# Patient Record
Sex: Male | Born: 1945 | Race: Black or African American | Hispanic: No | Smoking: Former smoker
Health system: Southern US, Community
[De-identification: ages and names within clinical notes are randomized; demographics above are authoritative.]

## PROBLEM LIST (undated history)

## (undated) DIAGNOSIS — I5030 Unspecified diastolic (congestive) heart failure: Secondary | ICD-10-CM

## (undated) DIAGNOSIS — E785 Hyperlipidemia, unspecified: Secondary | ICD-10-CM

## (undated) DIAGNOSIS — I2510 Atherosclerotic heart disease of native coronary artery without angina pectoris: Secondary | ICD-10-CM

## (undated) DIAGNOSIS — E119 Type 2 diabetes mellitus without complications: Secondary | ICD-10-CM

## (undated) DIAGNOSIS — D751 Secondary polycythemia: Secondary | ICD-10-CM

## (undated) DIAGNOSIS — K859 Acute pancreatitis without necrosis or infection, unspecified: Secondary | ICD-10-CM

## (undated) DIAGNOSIS — Z720 Tobacco use: Secondary | ICD-10-CM

## (undated) DIAGNOSIS — I700 Atherosclerosis of aorta: Secondary | ICD-10-CM

## (undated) DIAGNOSIS — J449 Chronic obstructive pulmonary disease, unspecified: Secondary | ICD-10-CM

## (undated) DIAGNOSIS — I4892 Unspecified atrial flutter: Secondary | ICD-10-CM

## (undated) DIAGNOSIS — N183 Chronic kidney disease, stage 3 unspecified: Secondary | ICD-10-CM

## (undated) DIAGNOSIS — I10 Essential (primary) hypertension: Secondary | ICD-10-CM

## (undated) DIAGNOSIS — C801 Malignant (primary) neoplasm, unspecified: Secondary | ICD-10-CM

## (undated) DIAGNOSIS — D3A00 Benign carcinoid tumor of unspecified site: Secondary | ICD-10-CM

## (undated) HISTORY — DX: Atherosclerotic heart disease of native coronary artery without angina pectoris: I25.10

## (undated) HISTORY — DX: Acute pancreatitis without necrosis or infection, unspecified: K85.90

## (undated) HISTORY — DX: Essential (primary) hypertension: I10

## (undated) HISTORY — DX: Type 2 diabetes mellitus without complications: E11.9

## (undated) HISTORY — DX: Chronic obstructive pulmonary disease, unspecified: J44.9

## (undated) HISTORY — DX: Unspecified diastolic (congestive) heart failure: I50.30

## (undated) HISTORY — DX: Secondary polycythemia: D75.1

## (undated) HISTORY — DX: Benign carcinoid tumor of unspecified site: D3A.00

## (undated) HISTORY — DX: Tobacco use: Z72.0

## (undated) HISTORY — PX: COLONOSCOPY: SHX174

## (undated) HISTORY — DX: Atherosclerosis of aorta: I70.0

## (undated) HISTORY — DX: Hyperlipidemia, unspecified: E78.5

## (undated) HISTORY — DX: Unspecified atrial flutter: I48.92

---

## 2004-09-21 ENCOUNTER — Ambulatory Visit: Payer: Self-pay | Admitting: General Surgery

## 2006-03-31 ENCOUNTER — Ambulatory Visit: Payer: Self-pay | Admitting: Internal Medicine

## 2007-09-26 ENCOUNTER — Ambulatory Visit: Payer: Self-pay | Admitting: Internal Medicine

## 2008-09-12 ENCOUNTER — Ambulatory Visit: Payer: Self-pay | Admitting: Internal Medicine

## 2009-04-01 ENCOUNTER — Ambulatory Visit: Payer: Self-pay | Admitting: Cardiology

## 2009-04-01 ENCOUNTER — Ambulatory Visit: Payer: Self-pay | Admitting: Ophthalmology

## 2009-04-15 ENCOUNTER — Ambulatory Visit: Payer: Self-pay | Admitting: Ophthalmology

## 2009-04-15 ENCOUNTER — Ambulatory Visit: Payer: Self-pay | Admitting: Internal Medicine

## 2009-05-09 ENCOUNTER — Ambulatory Visit: Payer: Self-pay | Admitting: Internal Medicine

## 2009-05-15 ENCOUNTER — Ambulatory Visit: Payer: Self-pay | Admitting: Internal Medicine

## 2009-05-27 ENCOUNTER — Ambulatory Visit: Payer: Self-pay | Admitting: Ophthalmology

## 2009-06-15 ENCOUNTER — Ambulatory Visit: Payer: Self-pay | Admitting: Internal Medicine

## 2009-07-16 ENCOUNTER — Ambulatory Visit: Payer: Self-pay | Admitting: Internal Medicine

## 2009-08-15 ENCOUNTER — Ambulatory Visit: Payer: Self-pay | Admitting: Internal Medicine

## 2009-12-01 ENCOUNTER — Ambulatory Visit: Payer: Self-pay | Admitting: Internal Medicine

## 2009-12-27 ENCOUNTER — Emergency Department: Payer: Self-pay | Admitting: Emergency Medicine

## 2010-01-26 ENCOUNTER — Ambulatory Visit: Payer: Self-pay | Admitting: Gastroenterology

## 2010-03-05 ENCOUNTER — Ambulatory Visit: Payer: Self-pay | Admitting: Gastroenterology

## 2011-01-22 ENCOUNTER — Ambulatory Visit: Payer: Self-pay | Admitting: Internal Medicine

## 2011-02-26 ENCOUNTER — Ambulatory Visit: Payer: Self-pay | Admitting: Internal Medicine

## 2014-06-06 DIAGNOSIS — E119 Type 2 diabetes mellitus without complications: Secondary | ICD-10-CM | POA: Insufficient documentation

## 2014-06-11 DIAGNOSIS — E279 Disorder of adrenal gland, unspecified: Secondary | ICD-10-CM

## 2014-06-11 DIAGNOSIS — E538 Deficiency of other specified B group vitamins: Secondary | ICD-10-CM | POA: Insufficient documentation

## 2014-06-11 DIAGNOSIS — R809 Proteinuria, unspecified: Secondary | ICD-10-CM | POA: Insufficient documentation

## 2014-06-11 DIAGNOSIS — E278 Other specified disorders of adrenal gland: Secondary | ICD-10-CM | POA: Insufficient documentation

## 2014-06-11 DIAGNOSIS — E781 Pure hyperglyceridemia: Secondary | ICD-10-CM | POA: Insufficient documentation

## 2014-10-22 ENCOUNTER — Ambulatory Visit: Payer: Self-pay | Admitting: Hematology and Oncology

## 2014-10-29 ENCOUNTER — Other Ambulatory Visit: Payer: Self-pay | Admitting: Physician Assistant

## 2014-10-29 ENCOUNTER — Ambulatory Visit: Payer: Self-pay | Admitting: Hematology and Oncology

## 2014-10-29 ENCOUNTER — Inpatient Hospital Stay: Payer: Self-pay | Admitting: Internal Medicine

## 2014-10-29 DIAGNOSIS — I10 Essential (primary) hypertension: Secondary | ICD-10-CM

## 2014-10-29 DIAGNOSIS — I4892 Unspecified atrial flutter: Secondary | ICD-10-CM

## 2014-10-29 DIAGNOSIS — J189 Pneumonia, unspecified organism: Secondary | ICD-10-CM

## 2014-10-29 DIAGNOSIS — E785 Hyperlipidemia, unspecified: Secondary | ICD-10-CM

## 2014-10-29 LAB — PRO B NATRIURETIC PEPTIDE: B-TYPE NATIURETIC PEPTID: 923 pg/mL — AB (ref 0–125)

## 2014-10-29 LAB — URINALYSIS, COMPLETE
Bacteria: NONE SEEN
Bilirubin,UR: NEGATIVE
Blood: NEGATIVE
GLUCOSE, UR: NEGATIVE mg/dL (ref 0–75)
Hyaline Cast: 9
Ketone: NEGATIVE
LEUKOCYTE ESTERASE: NEGATIVE
Nitrite: NEGATIVE
PH: 5 (ref 4.5–8.0)
SPECIFIC GRAVITY: 1.017 (ref 1.003–1.030)
Squamous Epithelial: <1

## 2014-10-29 LAB — PROTIME-INR
INR: 1
Prothrombin Time: 12.9 secs (ref 11.5–14.7)

## 2014-10-29 LAB — COMPREHENSIVE METABOLIC PANEL
ALBUMIN: 3 g/dL — AB (ref 3.4–5.0)
ANION GAP: 6 — AB (ref 7–16)
AST: 12 U/L — AB (ref 15–37)
Alkaline Phosphatase: 49 U/L
BILIRUBIN TOTAL: 0.8 mg/dL (ref 0.2–1.0)
BUN: 10 mg/dL (ref 7–18)
Calcium, Total: 9.4 mg/dL (ref 8.5–10.1)
Chloride: 101 mmol/L (ref 98–107)
Co2: 27 mmol/L (ref 21–32)
Creatinine: 1.11 mg/dL (ref 0.60–1.30)
EGFR (African American): >60
GLUCOSE: 131 mg/dL — AB (ref 65–99)
Osmolality: 279 (ref 275–301)
POTASSIUM: 4.7 mmol/L (ref 3.5–5.1)
SGPT (ALT): 24 U/L
SODIUM: 134 mmol/L — AB (ref 136–145)
TOTAL PROTEIN: 6.3 g/dL — AB (ref 6.4–8.2)

## 2014-10-29 LAB — CBC CANCER CENTER
BASOS ABS: 0 x10 3/mm (ref 0.0–0.1)
BASOS PCT: 0.4 %
EOS ABS: 0.2 x10 3/mm (ref 0.0–0.7)
EOS PCT: 1.6 %
HCT: 52.8 % — ABNORMAL HIGH (ref 40.0–52.0)
HGB: 17.5 g/dL (ref 13.0–18.0)
Lymphocyte #: 1.6 x10 3/mm (ref 1.0–3.6)
Lymphocyte %: 15.3 %
MCH: 32.7 pg (ref 26.0–34.0)
MCHC: 33.2 g/dL (ref 32.0–36.0)
MCV: 99 fL (ref 80–100)
MONOS PCT: 11.8 %
Monocyte #: 1.2 x10 3/mm — ABNORMAL HIGH (ref 0.2–1.0)
NEUTROS ABS: 7.2 x10 3/mm — AB (ref 1.4–6.5)
NEUTROS PCT: 70.9 %
Platelet: 231 x10 3/mm (ref 150–440)
RBC: 5.36 10*6/uL (ref 4.40–5.90)
RDW: 12.6 % (ref 11.5–14.5)
WBC: 10.2 x10 3/mm (ref 3.8–10.6)

## 2014-10-29 LAB — CK TOTAL AND CKMB (NOT AT ARMC)
CK, TOTAL: 64 U/L (ref 39–308)
CK-MB: 1.7 ng/mL (ref 0.5–3.6)

## 2014-10-29 LAB — CBC
HCT: 52 % (ref 40.0–52.0)
HGB: 17.1 g/dL (ref 13.0–18.0)
MCH: 32.8 pg (ref 26.0–34.0)
MCHC: 32.9 g/dL (ref 32.0–36.0)
MCV: 100 fL (ref 80–100)
Platelet: 237 10*3/uL (ref 150–440)
RBC: 5.22 10*6/uL (ref 4.40–5.90)
RDW: 12.6 % (ref 11.5–14.5)
WBC: 12.3 10*3/uL — ABNORMAL HIGH (ref 3.8–10.6)

## 2014-10-29 LAB — SEDIMENTATION RATE: Erythrocyte Sed Rate: 3 mm/hr (ref 0–20)

## 2014-10-29 LAB — IRON AND TIBC
IRON SATURATION: 30 %
IRON: 99 ug/dL (ref 65–175)
Iron Bind.Cap.(Total): 333 ug/dL (ref 250–450)
UNBOUND IRON-BIND. CAP.: 234 ug/dL

## 2014-10-29 LAB — TROPONIN I

## 2014-10-29 LAB — APTT: Activated PTT: 29.1 secs (ref 23.6–35.9)

## 2014-10-29 LAB — D-DIMER(ARMC): D-Dimer: 961 ng/ml

## 2014-10-29 LAB — FERRITIN: FERRITIN (ARMC): 282 ng/mL (ref 8–388)

## 2014-10-30 DIAGNOSIS — I517 Cardiomegaly: Secondary | ICD-10-CM

## 2014-10-30 LAB — BASIC METABOLIC PANEL
Anion Gap: 8 (ref 7–16)
BUN: 11 mg/dL (ref 7–18)
CALCIUM: 8.9 mg/dL (ref 8.5–10.1)
CHLORIDE: 103 mmol/L (ref 98–107)
CO2: 27 mmol/L (ref 21–32)
CREATININE: 0.99 mg/dL (ref 0.60–1.30)
EGFR (Non-African Amer.): >60
Glucose: 109 mg/dL — ABNORMAL HIGH (ref 65–99)
Osmolality: 276 (ref 275–301)
POTASSIUM: 4 mmol/L (ref 3.5–5.1)
SODIUM: 138 mmol/L (ref 136–145)

## 2014-10-30 LAB — CBC WITH DIFFERENTIAL/PLATELET
Bands: 3 %
COMMENT - H1-COM3: NORMAL
Eosinophil: 1 %
HCT: 48.4 % (ref 40.0–52.0)
HGB: 15.9 g/dL (ref 13.0–18.0)
Lymphocytes: 11 %
MCH: 32.6 pg (ref 26.0–34.0)
MCHC: 32.8 g/dL (ref 32.0–36.0)
MCV: 99 fL (ref 80–100)
METAMYELOCYTE: 2 %
Monocytes: 9 %
Myelocyte: 5 %
Platelet: 215 10*3/uL (ref 150–440)
RBC: 4.87 10*6/uL (ref 4.40–5.90)
RDW: 12.8 % (ref 11.5–14.5)
Segmented Neutrophils: 69 %
WBC: 12 10*3/uL — ABNORMAL HIGH (ref 3.8–10.6)

## 2014-10-30 LAB — MAGNESIUM
MAGNESIUM: 1.8 mg/dL
Magnesium: 1.3 mg/dL — ABNORMAL LOW

## 2014-10-30 LAB — HEMOGLOBIN A1C: Hemoglobin A1C: 7 % — ABNORMAL HIGH (ref 4.2–6.3)

## 2014-10-31 ENCOUNTER — Encounter: Payer: Self-pay | Admitting: Cardiovascular Disease

## 2014-10-31 DIAGNOSIS — I38 Endocarditis, valve unspecified: Secondary | ICD-10-CM

## 2014-10-31 LAB — CBC WITH DIFFERENTIAL/PLATELET
Bands: 1 %
Comment - H1-Com1: NORMAL
HCT: 49.3 % (ref 40.0–52.0)
HGB: 16.5 g/dL (ref 13.0–18.0)
Lymphocytes: 11 %
MCH: 33.4 pg (ref 26.0–34.0)
MCHC: 33.5 g/dL (ref 32.0–36.0)
MCV: 100 fL (ref 80–100)
Metamyelocyte: 3 %
Monocytes: 8 %
Myelocyte: 2 %
Platelet: 213 10*3/uL (ref 150–440)
RBC: 4.95 10*6/uL (ref 4.40–5.90)
RDW: 12.6 % (ref 11.5–14.5)
Segmented Neutrophils: 73 %
Variant Lymphocyte - H1-Rlymph: 2 %
WBC: 12 10*3/uL — ABNORMAL HIGH (ref 3.8–10.6)

## 2014-10-31 LAB — BASIC METABOLIC PANEL
ANION GAP: 6 — AB (ref 7–16)
BUN: 12 mg/dL (ref 7–18)
Calcium, Total: 9.4 mg/dL (ref 8.5–10.1)
Chloride: 101 mmol/L (ref 98–107)
Co2: 28 mmol/L (ref 21–32)
Creatinine: 1.05 mg/dL (ref 0.60–1.30)
EGFR (Non-African Amer.): >60
Glucose: 132 mg/dL — ABNORMAL HIGH (ref 65–99)
Osmolality: 272 (ref 275–301)
Potassium: 3.9 mmol/L (ref 3.5–5.1)
Sodium: 135 mmol/L — ABNORMAL LOW (ref 136–145)

## 2014-11-01 LAB — BASIC METABOLIC PANEL
Anion Gap: 8 (ref 7–16)
BUN: 11 mg/dL (ref 7–18)
CALCIUM: 9.4 mg/dL (ref 8.5–10.1)
CREATININE: 0.99 mg/dL (ref 0.60–1.30)
Chloride: 103 mmol/L (ref 98–107)
Co2: 27 mmol/L (ref 21–32)
EGFR (African American): >60
EGFR (Non-African Amer.): >60
Glucose: 125 mg/dL — ABNORMAL HIGH (ref 65–99)
Osmolality: 277 (ref 275–301)
Potassium: 3.8 mmol/L (ref 3.5–5.1)
SODIUM: 138 mmol/L (ref 136–145)

## 2014-11-01 LAB — PROT IMMUNOELECTROPHORES(ARMC)

## 2014-11-03 LAB — CULTURE, BLOOD (SINGLE)

## 2014-11-04 LAB — CBC WITH DIFFERENTIAL/PLATELET
Bands: 3 %
COMMENT - H1-COM1: NORMAL
Comment - H1-Com2: NORMAL
Eosinophil: 9 %
HCT: 53.6 % — ABNORMAL HIGH (ref 40.0–52.0)
HGB: 17.8 g/dL (ref 13.0–18.0)
LYMPHS PCT: 9 %
MCH: 33.2 pg (ref 26.0–34.0)
MCHC: 33.2 g/dL (ref 32.0–36.0)
MCV: 100 fL (ref 80–100)
METAMYELOCYTE: 5 %
MYELOCYTE: 3 %
Monocytes: 8 %
Platelet: 245 10*3/uL (ref 150–440)
Promyelocyte: 1 %
RBC: 5.38 10*6/uL (ref 4.40–5.90)
RDW: 12.7 % (ref 11.5–14.5)
SEGMENTED NEUTROPHILS: 62 %
WBC: 13.8 10*3/uL — ABNORMAL HIGH (ref 3.8–10.6)

## 2014-11-05 ENCOUNTER — Telehealth: Payer: Self-pay

## 2014-11-05 LAB — CREATININE, SERUM
Creatinine: 1.15 mg/dL (ref 0.60–1.30)
EGFR (African American): >60
EGFR (Non-African Amer.): >60

## 2014-11-05 NOTE — Telephone Encounter (Signed)
Patient contacted regarding discharge from [facility] on 11/05/14.  Patient understands to follow up with Dr. Rockey Situ on 11/27/14 at 8:00 at [facility]. Patient understands discharge instructions? yes Patient understands medications and regiment? yes Patient understands to bring all medications to this visit? yes

## 2014-11-06 ENCOUNTER — Encounter: Payer: Self-pay | Admitting: Cardiovascular Disease

## 2014-11-15 ENCOUNTER — Ambulatory Visit: Payer: Self-pay | Admitting: Hematology and Oncology

## 2014-11-27 ENCOUNTER — Encounter: Payer: Self-pay | Admitting: Cardiovascular Disease

## 2014-11-27 ENCOUNTER — Ambulatory Visit (INDEPENDENT_AMBULATORY_CARE_PROVIDER_SITE_OTHER): Payer: PPO | Admitting: Cardiovascular Disease

## 2014-11-27 ENCOUNTER — Encounter (INDEPENDENT_AMBULATORY_CARE_PROVIDER_SITE_OTHER): Payer: Self-pay

## 2014-11-27 VITALS — BP 140/74 | HR 90 | Ht 66.0 in | Wt 216.5 lb

## 2014-11-27 DIAGNOSIS — Z87891 Personal history of nicotine dependence: Secondary | ICD-10-CM

## 2014-11-27 DIAGNOSIS — I483 Typical atrial flutter: Secondary | ICD-10-CM

## 2014-11-27 DIAGNOSIS — I4891 Unspecified atrial fibrillation: Secondary | ICD-10-CM

## 2014-11-27 DIAGNOSIS — J439 Emphysema, unspecified: Secondary | ICD-10-CM

## 2014-11-27 DIAGNOSIS — Z720 Tobacco use: Secondary | ICD-10-CM

## 2014-11-27 DIAGNOSIS — I10 Essential (primary) hypertension: Secondary | ICD-10-CM | POA: Insufficient documentation

## 2014-11-27 DIAGNOSIS — J189 Pneumonia, unspecified organism: Secondary | ICD-10-CM

## 2014-11-27 MED ORDER — METOPROLOL SUCCINATE ER 50 MG PO TB24: 50.0000 mg | ORAL_TABLET | Freq: Every day | ORAL | Status: DC

## 2014-11-27 MED ORDER — FUROSEMIDE 20 MG PO TABS: 20.0000 mg | ORAL_TABLET | Freq: Every day | ORAL | Status: DC | PRN

## 2014-11-27 MED ORDER — METFORMIN HCL 1000 MG PO TABS: 1000.0000 mg | ORAL_TABLET | Freq: Two times a day (BID) | ORAL | Status: DC

## 2014-11-27 NOTE — Assessment & Plan Note (Signed)
Long history of smoking. Mildly short of breath on today's visit though he denies any symptoms. Recent COPD exacerbation, still recovering

## 2014-11-27 NOTE — Patient Instructions (Signed)
  You are still in atrial flutter  Please start lasix every other day, to help the breathing Please start metoprolol 50 mg daily, to slow the speed  Please call us if you have new issues that need to be addressed before your next appt.  Your physician wants you to follow-up in: 2 to 3 weeks

## 2014-11-27 NOTE — Assessment & Plan Note (Signed)
Normal sinus rhythm was restored in the hospital after initial presentation of atrial flutter. Arrhythmia likely exacerbated by underlying COPD with possible pneumonia. He was discharged with continued cough, sputum, though improving. Now presenting with recurrent atrial flutter. He does have underlying COPD, unable to exclude some mild diastolic CHF. We will start metoprolol 50 mg daily, Lasix 20 mg every other day. Continue amiodarone, anticoagulation. I hope would be that he will convert back to normal sinus rhythm. If he does not, he may need repeat cardioversion at a later date, and several weeks. He is relatively asymptomatic at this time

## 2014-11-27 NOTE — Progress Notes (Signed)
[name], male    DOB: Apr 01, 1946, 69 y.o.   MRN: [id number]  HPI Comments: [name] is a 69 year old gentleman, patient of Dr. Rebecka Apley, with history of hypertension, diabetes, COPD, polycythemia who was sent from oncology to the hospital for tachycardia. Cardiology evaluated the patient 10/29/2014. EKG showed atrial flutter with heart rate 140s up to 150. He has a long smoking history for 45 years. He also reported having 2-3 weeks of cough with yellow phlegm.   In the hospital he was started on beta blockers, diltiazem, digoxin, eventually started on amiodarone infusion with anticoagulation, eliquis. Treatment was started for suspected pneumonia Initially started on levofloxacin but had an allergic reaction with diffuse rash. Change to azithromycin and Rocephin. He had TEE and cardioversion on 10/30/2014. Normal sinus rhythm was successfully restored and he was discharged in normal sinus rhythm  TEE report showed ejection fraction 45-50%, right and left atrium mildly enlarged, RV mildly dilated with mildly reduced systolic function, small amount of plaque in the aorta CT scan of the chest 10/29/2014 in the hospital showed bilateral predominantly lower lobe pulmonary opacity most consistent with pneumonia  In follow-up today, he feels well, has no significant shortness of breath, no chest pain. No significant lower extremity edema. He is unsure whether he is in normal rhythm or atrial flutter. Previous reaction to the levofloxacin/rash resolved at home.  EKG on today's visit shows atrial flutter with ventricular rate 90 bpm  Allergies not on file  Outpatient Encounter Prescriptions as of 11/27/2014  Medication Sig  . amiodarone (PACERONE) 200 MG tablet Take 200 mg by mouth 2 (two) times daily.  Marland Kitchen apixaban (ELIQUIS) 5 MG TABS tablet Take 5 mg by mouth 2 (two) times daily.  . digoxin (LANOXIN) 0.25 MG tablet Take 0.25 mg by mouth daily.  Marland Kitchen diltiazem (CARDIZEM CD)  180 MG 24 hr capsule Take 180 mg by mouth 2 (two) times daily.  . diphenhydrAMINE (SOMINEX) 25 MG tablet Take 25 mg by mouth 2 (two) times daily.  . famotidine (PEPCID) 20 MG tablet Take 20 mg by mouth 2 (two) times daily.  . furosemide (LASIX) 20 MG tablet Take 1 tablet (20 mg total) by mouth daily as needed.  . metFORMIN (GLUCOPHAGE) 1000 MG tablet Take 1 tablet (1,000 mg total) by mouth 2 (two) times daily with a meal.  . metoprolol succinate (TOPROL-XL) 50 MG 24 hr tablet Take 1 tablet (50 mg total) by mouth daily. Take with or immediately following a meal.  . rosuvastatin (CRESTOR) 10 MG tablet Take 10 mg by mouth daily.  . tamsulosin (FLOMAX) 0.4 MG CAPS capsule Take 0.4 mg by mouth.  . [DISCONTINUED] metFORMIN (GLUCOPHAGE) 1000 MG tablet Take 1,000 mg by mouth 2 (two) times daily with a meal.    Past Medical History  Diagnosis Date  . Hyperlipidemia   . Hypertension   . Diabetes mellitus without complication   . Tachycardia   . Polycythemia   . A-fib   . Atrial flutter   . COPD (chronic obstructive pulmonary disease)     Past Surgical History  Procedure Laterality Date  . Tee with cardioversion  12/16    Social History  reports that he quit smoking about 2 years ago. His smoking use included Cigarettes. He has a 67.5 pack-year smoking history. He does not have any smokeless tobacco history on file. He reports that he drinks about 7.2 oz of alcohol per week. He reports that he does not use illicit drugs.  Family History Family history is unknown by patient.   Review of Systems  Constitutional: Negative.   HENT: Negative.   Respiratory: Negative.   Cardiovascular: Negative.   Gastrointestinal: Negative.   Musculoskeletal: Negative.   Skin: Negative.   Neurological: Negative.   Hematological: Negative.   Psychiatric/Behavioral: Negative.   All other systems reviewed and are negative.   BP 140/74 mmHg  Pulse 90  Ht 5\' 6"  (1.676 m)  Wt 216 lb 8 oz (98.204 kg)   BMI 34.96 kg/m2  Physical Exam  Constitutional: He is oriented to person, place, and time. He appears well-developed and well-nourished.  HENT:  Head: Normocephalic.  Nose: Nose normal.  Mouth/Throat: Oropharynx is clear and moist.  Eyes: Conjunctivae are normal. Pupils are equal, round, and reactive to light.  Neck: Normal range of motion. Neck supple. No JVD present.  Cardiovascular: Normal rate, regular rhythm, S1 normal, S2 normal, normal heart sounds and intact distal pulses.  Exam reveals no gallop and no friction rub.   No murmur heard. Pulmonary/Chest: Effort normal. No respiratory distress. He has wheezes. He has no rales. He exhibits no tenderness.  Abdominal: Soft. Bowel sounds are normal. He exhibits no distension. There is no tenderness.  Musculoskeletal: Normal range of motion. He exhibits no edema or tenderness.  Lymphadenopathy:    He has no cervical adenopathy.  Neurological: He is alert and oriented to person, place, and time. Coordination normal.  Skin: Skin is warm and dry. No rash noted. No erythema.  Psychiatric: He has a normal mood and affect. His behavior is normal. Judgment and thought content normal.      Assessment and Plan   Nursing note and vitals reviewed.

## 2014-11-27 NOTE — Assessment & Plan Note (Signed)
Long history of smoking, severe underlying COPD. A benefit from inhalers, follow-up with pulmonary if symptoms get worse.

## 2014-11-27 NOTE — Assessment & Plan Note (Signed)
Recent allergy to Levaquin. In the hospital was changed to Z-Pak and ceftriaxone. He reports symptoms have improved

## 2014-12-09 ENCOUNTER — Telehealth: Payer: Self-pay

## 2014-12-09 ENCOUNTER — Other Ambulatory Visit: Payer: Self-pay | Admitting: *Deleted

## 2014-12-09 MED ORDER — AMIODARONE HCL 200 MG PO TABS: 200.0000 mg | ORAL_TABLET | Freq: Two times a day (BID) | ORAL | Status: DC

## 2014-12-09 MED ORDER — DILTIAZEM HCL ER COATED BEADS 180 MG PO CP24: 180.0000 mg | ORAL_CAPSULE | Freq: Two times a day (BID) | ORAL | Status: DC

## 2014-12-09 MED ORDER — DIGOXIN 250 MCG PO TABS: 0.2500 mg | ORAL_TABLET | Freq: Every day | ORAL | Status: DC

## 2014-12-09 NOTE — Telephone Encounter (Signed)
Pt called, has some questions regarding her Cartia, Amiodarone, and Digoxin not sure if she should continue to keep taking these. Please call and advise

## 2014-12-09 NOTE — Telephone Encounter (Signed)
Spoke w/ pt.  He reports that he was given rx for these meds and would like to know if he should continue.  Advised him that Dr. Rockey Situ did not make any changes to his meds at last ov.  He requests a 30 day refill on these meds, as he is out and has appt w/ Dr. Rockey Situ 12/27/14.

## 2014-12-27 ENCOUNTER — Other Ambulatory Visit: Payer: Self-pay

## 2014-12-27 ENCOUNTER — Ambulatory Visit (INDEPENDENT_AMBULATORY_CARE_PROVIDER_SITE_OTHER): Payer: PPO | Admitting: Cardiovascular Disease

## 2014-12-27 ENCOUNTER — Encounter: Payer: Self-pay | Admitting: Cardiovascular Disease

## 2014-12-27 ENCOUNTER — Ambulatory Visit: Payer: Self-pay | Admitting: Cardiovascular Disease

## 2014-12-27 VITALS — BP 132/60 | HR 60 | Ht 67.0 in | Wt 212.5 lb

## 2014-12-27 DIAGNOSIS — I4892 Unspecified atrial flutter: Secondary | ICD-10-CM

## 2014-12-27 DIAGNOSIS — I10 Essential (primary) hypertension: Secondary | ICD-10-CM

## 2014-12-27 DIAGNOSIS — Z720 Tobacco use: Secondary | ICD-10-CM

## 2014-12-27 DIAGNOSIS — I483 Typical atrial flutter: Secondary | ICD-10-CM

## 2014-12-27 DIAGNOSIS — Z87891 Personal history of nicotine dependence: Secondary | ICD-10-CM

## 2014-12-27 DIAGNOSIS — J439 Emphysema, unspecified: Secondary | ICD-10-CM

## 2014-12-27 DIAGNOSIS — J189 Pneumonia, unspecified organism: Secondary | ICD-10-CM

## 2014-12-27 LAB — PROTIME-INR

## 2014-12-27 NOTE — Patient Instructions (Addendum)
You are doing well. No medication changes were made.  We will schedule a cardioversion at [facility] for atrial flutter Please arrive at the [place] entrance of [facility] on Tuesday, Feb 23 @ 6:30 am  Please call us if you have new issues that need to be addressed before your next appt.  Your physician wants you to follow-up in: 1 month.   Your physician has recommended that you have a Cardioversion (DCCV). Electrical Cardioversion uses a jolt of electricity to your heart either through paddles or wired patches attached to your chest. This is a controlled, usually prescheduled, procedure. Defibrillation is done under light anesthesia in the hospital, and you usually go home the day of the procedure. This is done to get your heart back into a normal rhythm. You are not awake for the procedure. Please see the instruction sheet given to you today.

## 2014-12-27 NOTE — Assessment & Plan Note (Signed)
We have encouraged him to continue to work on weaning his cigarettes and smoking cessation. He will continue to work on this and does not want any assistance with chantix.  

## 2014-12-27 NOTE — Assessment & Plan Note (Signed)
He is currently on 2 inhalers, sounds like Advair and Symbicort though he does not know the names. We will have him schedule a visit with pulmonary, Dr. Waunita Schooner or [facility]

## 2014-12-27 NOTE — Assessment & Plan Note (Signed)
Blood pressure is well controlled on today's visit. No changes made to the medications. 

## 2014-12-27 NOTE — Assessment & Plan Note (Signed)
We will arrange cardioversion next week.  encouraged him to stay on his current medication regimen

## 2014-12-27 NOTE — Progress Notes (Signed)
[name], male    DOB: [birth year], 69 y.o.   MRN: [id number]  HPI Comments: [name] is a 69 year old gentleman, patient of Dr. Rebecka Apley, with history of hypertension, diabetes, COPD, polycythemia who was sent from oncology to the hospital for tachycardia. Cardiology evaluated the patient 10/29/2014. EKG showed atrial flutter with heart rate 140s up to 150. He has a long smoking history for 45 years. He also reported having 2-3 weeks of cough with yellow phlegm.  In the hospital he was started on beta blockers, diltiazem, digoxin, eventually started on amiodarone infusion with anticoagulation, eliquis. Treatment was started for suspected pneumonia Initially started on levofloxacin but had an allergic reaction with diffuse rash. Change to azithromycin and Rocephin. He had TEE and cardioversion on 10/30/2014. Normal sinus rhythm was successfully restored and he was discharged in normal sinus rhythm  Follow-up in the office on his last visit showed he had recurrent atrial flutter. In follow-up today, he continues in atrial flutter.  EKG on today's visit shows atrial flutter with ventricular rate 60 bpm  he feels well, has no significant shortness of breath, no chest pain. No significant lower extremity edema.  He is relatively asymptomatic. Ideally he would like to decrease some of his medications. He is interested again in cardioversion  Other past medical history TEE report showed ejection fraction 45-50%, right and left atrium mildly enlarged, RV mildly dilated with mildly reduced systolic function, small amount of plaque in the aorta CT scan of the chest 10/29/2014 in the hospital showed bilateral predominantly lower lobe pulmonary opacity most consistent with pneumonia      Allergies  Allergen Reactions  . Levaquin [Levofloxacin In D5w] Rash    Outpatient Encounter Prescriptions as of 12/27/2014  Medication Sig  . amiodarone (PACERONE) 200 MG tablet Take 1  tablet (200 mg total) by mouth 2 (two) times daily.  Marland Kitchen apixaban (ELIQUIS) 5 MG TABS tablet Take 5 mg by mouth 2 (two) times daily.  . digoxin (LANOXIN) 0.25 MG tablet Take 1 tablet (0.25 mg total) by mouth daily.  Marland Kitchen diltiazem (CARDIZEM CD) 180 MG 24 hr capsule Take 1 capsule (180 mg total) by mouth 2 (two) times daily.  . finasteride (PROSCAR) 5 MG tablet Take 5 mg by mouth daily.  . furosemide (LASIX) 20 MG tablet Take 20 mg by mouth daily.  . metFORMIN (GLUCOPHAGE) 1000 MG tablet Take 1 tablet (1,000 mg total) by mouth 2 (two) times daily with a meal.  . metoprolol succinate (TOPROL-XL) 50 MG 24 hr tablet Take 1 tablet (50 mg total) by mouth daily. Take with or immediately following a meal.  . rosuvastatin (CRESTOR) 10 MG tablet Take 10 mg by mouth daily.  . tamsulosin (FLOMAX) 0.4 MG CAPS capsule Take 0.4 mg by mouth.  . [DISCONTINUED] diphenhydrAMINE (SOMINEX) 25 MG tablet Take 25 mg by mouth 2 (two) times daily.  . [DISCONTINUED] famotidine (PEPCID) 20 MG tablet Take 20 mg by mouth 2 (two) times daily.  . [DISCONTINUED] furosemide (LASIX) 20 MG tablet Take 1 tablet (20 mg total) by mouth daily as needed. (Patient not taking: Reported on 12/27/2014)    Past Medical History  Diagnosis Date  . Hyperlipidemia   . Hypertension   . Diabetes mellitus without complication   . Tachycardia   . Polycythemia   . A-fib   . Atrial flutter   . COPD (chronic obstructive pulmonary disease)     Past Surgical History  Procedure Laterality Date  . Tee with  cardioversion  12/16    Social History  reports that he quit smoking about 2 years ago. His smoking use included Cigarettes. He has a 67.5 pack-year smoking history. He does not have any smokeless tobacco history on file. He reports that he drinks about 7.2 oz of alcohol per week. He reports that he does not use illicit drugs.  Family History Family history is unknown by patient.  Review of Systems  Constitutional: Negative.   Respiratory:  Negative.   Cardiovascular: Negative.   Gastrointestinal: Negative.   Musculoskeletal: Negative.   Skin: Negative.   Neurological: Negative.   Hematological: Negative.   Psychiatric/Behavioral: Negative.   All other systems reviewed and are negative.   BP 132/60 mmHg  Pulse 60  Ht 5\' 7"  (1.702 m)  Wt 212 lb 8 oz (96.389 kg)  BMI 33.27 kg/m2  Physical Exam  Constitutional: He is oriented to person, place, and time. He appears well-developed and well-nourished.  HENT:  Head: Normocephalic.  Nose: Nose normal.  Mouth/Throat: Oropharynx is clear and moist.  Eyes: Conjunctivae are normal. Pupils are equal, round, and reactive to light.  Neck: Normal range of motion. Neck supple. No JVD present.  Cardiovascular: Normal rate, regular rhythm, S1 normal, S2 normal, normal heart sounds and intact distal pulses.  Exam reveals no gallop and no friction rub.   No murmur heard. Pulmonary/Chest: Effort normal. No respiratory distress. He has no rales. He exhibits no tenderness.  Abdominal: Soft. Bowel sounds are normal. He exhibits no distension. There is no tenderness.  Musculoskeletal: Normal range of motion. He exhibits no edema or tenderness.  Lymphadenopathy:    He has no cervical adenopathy.  Neurological: He is alert and oriented to person, place, and time. Coordination normal.  Skin: Skin is warm and dry. No rash noted. No erythema.  Psychiatric: He has a normal mood and affect. His behavior is normal. Judgment and thought content normal.      Assessment and Plan   Nursing note and vitals reviewed.

## 2014-12-27 NOTE — Assessment & Plan Note (Signed)
Recent pneumonia likely the trigger for his atrial flutter

## 2014-12-30 ENCOUNTER — Other Ambulatory Visit: Payer: Self-pay

## 2014-12-30 DIAGNOSIS — I4892 Unspecified atrial flutter: Secondary | ICD-10-CM

## 2014-12-31 ENCOUNTER — Telehealth: Payer: Self-pay

## 2014-12-31 DIAGNOSIS — J449 Chronic obstructive pulmonary disease, unspecified: Secondary | ICD-10-CM

## 2014-12-31 NOTE — Telephone Encounter (Signed)
Spoke w/ pt.  Advised him that Dr. Rockey Situ recommends he see a pulmonologist for COPD. Advised pt that he is sched to see Dr. Stevenson Clinch 01/02/15 @ 11:00. He is appreciative and will call back w/ any questions or concerns.

## 2015-01-02 ENCOUNTER — Encounter: Payer: Self-pay | Admitting: Internal Medicine

## 2015-01-02 ENCOUNTER — Ambulatory Visit (INDEPENDENT_AMBULATORY_CARE_PROVIDER_SITE_OTHER): Payer: PPO | Admitting: Internal Medicine

## 2015-01-02 VITALS — BP 122/70 | HR 84 | Temp 97.6°F | Ht 67.0 in | Wt 218.0 lb

## 2015-01-02 DIAGNOSIS — J439 Emphysema, unspecified: Secondary | ICD-10-CM | POA: Insufficient documentation

## 2015-01-02 DIAGNOSIS — Z01811 Encounter for preprocedural respiratory examination: Secondary | ICD-10-CM

## 2015-01-02 DIAGNOSIS — Z1211 Encounter for screening for malignant neoplasm of colon: Secondary | ICD-10-CM | POA: Insufficient documentation

## 2015-01-02 DIAGNOSIS — J449 Chronic obstructive pulmonary disease, unspecified: Secondary | ICD-10-CM | POA: Insufficient documentation

## 2015-01-02 NOTE — Progress Notes (Signed)
Date: 01/02/2015  MRN# [id number] [name] Mar 16, 1946  Referring Physician: Dr. Rockey Situ PMD - Dr. Letitia Neri [name] is a 69 y.o. old male seen in consultation for COPD optimization  CC: "Sent here by Dr. Rockey Situ" Chief Complaint  Patient presents with  . Advice Only    Pt referred by Dr. Rockey Situ. Pt denies cough, sob, wheezing and chest tightness.    HPI:   She is a 69 year old male with a past medical history significant for atrial flutter, COPD, hypertension referred by Dr.Golan for COPD optimization and preop pulmonary evaluation prior to scheduled cardioversion for atrial flutter. Patient had a recent admission to the [facility] in December 2050 for 8 days, he was noted to be tachycardic at his oncology office for which he is following fall polycythemia vera do not citation he was noted to be in atrial flutter, also noted to have a pneumonia (chest x-ray showed patchy airspace disease); at that time he was treated with antibiotics and steroids. Patient states that he's never been hospitalized for breathing issues are her to be intubated for any type of respiratory distress. He states that he gets a yearly cold but does not need admission for antibiotics every year. His primary care physician has been on Symbicort and Spiriva for which he uses on a regular basis and rinses afterwards. He does not endorse a morning cough, or sputum production. He quit smoking 2 years ago. He is currently following with cardiology and will have a scheduled cardioversion for atrial flutter early next week.   PMHX:   Past Medical History  Diagnosis Date  . Hyperlipidemia   . Hypertension   . Diabetes mellitus without complication   . Tachycardia   . Polycythemia   . A-fib   . Atrial flutter   . COPD (chronic obstructive pulmonary disease)    Surgical Hx:  Past Surgical History  Procedure Laterality Date  . Tee with cardioversion  12/16   Family Hx:  Family  History  Problem Relation Age of Onset  . Family history unknown: Yes   Social Hx:   History  Substance Use Topics  . Smoking status: Former Smoker -- 1.50 packs/day for 45 years    Types: Cigarettes    Quit date: 11/27/2012  . Smokeless tobacco: Not on file  . Alcohol Use: 7.2 oz/week    12 Cans of beer per week   Medication:   Current Outpatient Rx  Name  Route  Sig  Dispense  Refill  . amiodarone (PACERONE) 200 MG tablet   Oral   Take 1 tablet (200 mg total) by mouth 2 (two) times daily.   60 tablet   3   . apixaban (ELIQUIS) 5 MG TABS tablet   Oral   Take 5 mg by mouth 2 (two) times daily.         . budesonide-formoterol (SYMBICORT) 80-4.5 MCG/ACT inhaler   Inhalation   Inhale 1 puff into the lungs 2 times daily at 12 noon and 4 pm.         . Cyanocobalamin 1000 MCG TBCR   Oral   Take 1,000 mcg by mouth daily.         . digoxin (LANOXIN) 0.25 MG tablet   Oral   Take 1 tablet (0.25 mg total) by mouth daily.   30 tablet   3   . diltiazem (CARDIZEM CD) 180 MG 24 hr capsule   Oral   Take 1 capsule (180 mg total) by  mouth 2 (two) times daily.   60 capsule   3   . finasteride (PROSCAR) 5 MG tablet   Oral   Take 5 mg by mouth daily.         . furosemide (LASIX) 20 MG tablet   Oral   Take 20 mg by mouth daily.         Marland Kitchen glimepiride (AMARYL) 1 MG tablet   Oral   Take 1 mg by mouth as needed.         . metFORMIN (GLUCOPHAGE) 1000 MG tablet   Oral   Take 1 tablet (1,000 mg total) by mouth 2 (two) times daily with a meal.   180 tablet   0   . metoprolol succinate (TOPROL-XL) 50 MG 24 hr tablet   Oral   Take 1 tablet (50 mg total) by mouth daily. Take with or immediately following a meal.   30 tablet   6   . rosuvastatin (CRESTOR) 10 MG tablet   Oral   Take 10 mg by mouth daily.         . tamsulosin (FLOMAX) 0.4 MG CAPS capsule   Oral   Take 0.4 mg by mouth.         . tiotropium (SPIRIVA) 18 MCG inhalation capsule   Inhalation    Place 1 puff into inhaler and inhale daily.             Allergies:  Levaquin  Review of Systems: Gen:  Denies  fever, sweats, chills HEENT: Denies blurred vision, double vision, ear pain, eye pain, hearing loss, nose bleeds, sore throat Cvc:  No dizziness, chest pain or heaviness Resp:   Denies cough or sputum porduction, shortness of breath Gi: Denies swallowing difficulty, stomach pain, nausea or vomiting, diarrhea, constipation, bowel incontinence Gu:  Denies bladder incontinence, burning urine Ext:   No Joint pain, stiffness or swelling Skin: No skin rash, easy bruising or bleeding or hives Endoc:  No polyuria, polydipsia , polyphagia or weight change Psych: No depression, insomnia or hallucinations  Other:  All other systems negative  Physical Examination:   VS: BP 122/70 mmHg  Pulse 84  Temp(Src) 97.6 F (36.4 C) (Oral)  Ht 5\' 7"  (1.702 m)  Wt 218 lb (98.884 kg)  BMI 34.14 kg/m2  SpO2 94%  General Appearance: No distress  Neuro:without focal findings, mental status, speech normal, alert and oriented, cranial nerves 2-12 intact, reflexes normal and symmetric, sensation grossly normal  HEENT: PERRLA, EOM intact, no ptosis, no other lesions noticed; Mallampati 2 Pulmonary: normal breath sounds., diaphragmatic excursion normal.No wheezing, No rales;   Sputum Production:  none CardiovascularNormal S1,S2.  No m/r/g.  Abdominal aorta pulsation normal.    Abdomen: Benign, Soft, non-tender, No masses, hepatosplenomegaly, No lymphadenopathy Renal:  No costovertebral tenderness  GU:  No performed at this time. Endoc: No evident thyromegaly, no signs of acromegaly or Cushing features Skin:   warm, no rashes, no ecchymosis  Extremities: normal, no cyanosis, clubbing, no edema, warm with normal capillary refill. Other findings:   Labs results:   Rad results: (The following images and results were reviewed by Dr. Stevenson Clinch). CTA Chest 10/29/14 FINDINGS: There are no filling  defects in the pulmonary arterial tree to suggest acute pulmonary thromboembolism.  There is reflux of contrast to the right hepatic vein suggesting an element of right heart dysfunction.  Small nodes scattered throughout the mediastinum. Three vessel and left main coronary artery calcification.  No pneumothorax.  Small bilateral pleural effusions.  Minimal patchy airspace opacities in the right upper lobe. Patchy airspace opacities in the right lower lobe, primarily in the medial and posterior basal segments. Similar opacities in the posterior basal segment of the left lower lobe. Centrilobular nodules and tiny airspace opacities in the lateral basal segments of the lower lobes and lateral segment the right middle lobe.  There is bronchial wall thickening and some mucoid impaction of bilateral lower lobe airways.  No vertebral compression deformity.  No acute rib deformity.  Stable left adrenal adenomas.  Review of the MIP images confirms the above findings.  IMPRESSION: No evidence of acute pulmonary thromboembolism.  Bilateral predominantly lower lobe pulmonary opacities associated with small scattered mediastinal nodes most consistent with inflammatory process. Bronchial wall thickening with some mucoid impaction is associated.   Assessment and Plan: COPD type A Currently COPD is well controlled  Given optimal treatment with Symbicort and Spiriva for a number of years by her primary care physician. Patient is currently asymptomatic, only major complaint is some mild dyspnea on severe exertion. CAT 2,  mMRC 0   Plan: Continue abstaining from tobacco Continue with Spiriva and Symbicort   Encounter for preoperative pulmonary examination At this time patient is at a low risk for post operative pulmonary complications defined as:  Bronchospasm, infection, exacerbation of COPD, and atelecatsis.   Risk for postoperative respiratory failure 1.8%. Chest 2011.   Risk  reduction strategy:  Preoperative Pulmonary Risk Assessment Patient is proposed for cardioversion of atrial flutter   General Risk Reduction Strategies: - All patients warrant post-operative incentive spirometry. For those with obstruction, also consider flutter valve. - Early ambulation, PT/OT - DVT prophylaxis where appropriate - Adequate pain control without oversedation           Updated Medication List Outpatient Encounter Prescriptions as of 01/02/2015  Medication Sig  . amiodarone (PACERONE) 200 MG tablet Take 1 tablet (200 mg total) by mouth 2 (two) times daily.  Marland Kitchen apixaban (ELIQUIS) 5 MG TABS tablet Take 5 mg by mouth 2 (two) times daily.  . budesonide-formoterol (SYMBICORT) 80-4.5 MCG/ACT inhaler Inhale 1 puff into the lungs 2 times daily at 12 noon and 4 pm.  . Cyanocobalamin 1000 MCG TBCR Take 1,000 mcg by mouth daily.  . digoxin (LANOXIN) 0.25 MG tablet Take 1 tablet (0.25 mg total) by mouth daily.  Marland Kitchen diltiazem (CARDIZEM CD) 180 MG 24 hr capsule Take 1 capsule (180 mg total) by mouth 2 (two) times daily.  . finasteride (PROSCAR) 5 MG tablet Take 5 mg by mouth daily.  . furosemide (LASIX) 20 MG tablet Take 20 mg by mouth daily.  Marland Kitchen glimepiride (AMARYL) 1 MG tablet Take 1 mg by mouth as needed.  . metFORMIN (GLUCOPHAGE) 1000 MG tablet Take 1 tablet (1,000 mg total) by mouth 2 (two) times daily with a meal.  . metoprolol succinate (TOPROL-XL) 50 MG 24 hr tablet Take 1 tablet (50 mg total) by mouth daily. Take with or immediately following a meal.  . rosuvastatin (CRESTOR) 10 MG tablet Take 10 mg by mouth daily.  . tamsulosin (FLOMAX) 0.4 MG CAPS capsule Take 0.4 mg by mouth.  . tiotropium (SPIRIVA) 18 MCG inhalation capsule Place 1 puff into inhaler and inhale daily.    Orders for this visit: Orders Placed This Encounter  Procedures  . Spirometry with Graph    Order Specific Question:  Where should this test be performed?    Answer:  [facility]    Order  Specific Question:  Basic spirometry  Answer:  Yes    Order Specific Question:  Spirometry pre & post bronchodilator    Answer:  No     Thank  you for the consultation and for allowing [facility] to assist in the care of your patient. Our recommendations are noted above.  Please contact us if we can be of further service.   Vilinda Boehringer, MD [facility] Office Number: [phone]

## 2015-01-02 NOTE — Assessment & Plan Note (Addendum)
Currently COPD is well controlled  Given optimal treatment with Symbicort and Spiriva for a number of years by her primary care physician. Patient is currently asymptomatic, only major complaint is some mild dyspnea on severe exertion. CAT 2,  mMRC 0   Plan: Continue abstaining from tobacco Continue with Spiriva and Symbicort

## 2015-01-02 NOTE — Patient Instructions (Signed)
Follow up with Dr. Stevenson Clinch in 1 month 1. Continue with Symbicort and Sprivia - rinse\gargle after each use 2. Continue with NOT smoking 3. Follow up with your Cardiologist for abnormal heart rate.

## 2015-01-02 NOTE — Assessment & Plan Note (Signed)
At this time patient is at a low risk for post operative pulmonary complications defined as:  Bronchospasm, infection, exacerbation of COPD, and atelecatsis.   Risk for postoperative respiratory failure 1.8%. Chest 2011.   Risk reduction strategy:  Preoperative Pulmonary Risk Assessment Patient is proposed for cardioversion of atrial flutter   General Risk Reduction Strategies: - All patients warrant post-operative incentive spirometry. For those with obstruction, also consider flutter valve. - Early ambulation, PT/OT - DVT prophylaxis where appropriate - Adequate pain control without oversedation

## 2015-01-07 ENCOUNTER — Ambulatory Visit: Payer: Self-pay | Admitting: Cardiovascular Disease

## 2015-01-07 DIAGNOSIS — I4892 Unspecified atrial flutter: Secondary | ICD-10-CM

## 2015-01-09 ENCOUNTER — Telehealth: Payer: Self-pay | Admitting: *Deleted

## 2015-01-09 NOTE — Telephone Encounter (Signed)
Have you gotten a prior auth on him?

## 2015-01-09 NOTE — Telephone Encounter (Signed)
Insurance denied medication. Digoxin, pt needs [place] to send something to insurance company stating why he needs this.   Please call patient with any questions. He wants to know what can be done if this does not work.

## 2015-01-13 ENCOUNTER — Ambulatory Visit: Payer: Self-pay | Admitting: Internal Medicine

## 2015-01-13 NOTE — Telephone Encounter (Signed)
Prior authorization faxed at 12:12 on 01/13/2015 to [facility] for Digoxin.

## 2015-01-13 NOTE — Telephone Encounter (Signed)
Patient came to office to check on form status.  [name] received fax this morning.  Dr. Rockey Situ to sign and return fax.  Patient advised that this was being done within the hour and to call his pharmacy between 2 and 3 today to check on his refill for Digoxin.  Patient was also advised to call the office if he had any further issues with this prescription.  Patient has not had his medicine since Saturday.

## 2015-01-14 ENCOUNTER — Ambulatory Visit
Admit: 2015-01-14 | Disposition: A | Discharge status: Home / Self Care | Payer: Self-pay | Attending: Internal Medicine | Admitting: Internal Medicine

## 2015-01-14 NOTE — Telephone Encounter (Signed)
LMOM per insurance company [employer] has approved the Digoxin from 01/14/2015 until 11/15/1898.

## 2015-01-27 ENCOUNTER — Encounter: Payer: Self-pay | Admitting: Cardiovascular Disease

## 2015-01-27 ENCOUNTER — Ambulatory Visit (INDEPENDENT_AMBULATORY_CARE_PROVIDER_SITE_OTHER): Payer: PPO | Admitting: Cardiovascular Disease

## 2015-01-27 VITALS — BP 120/60 | HR 69 | Ht 68.0 in | Wt 220.0 lb

## 2015-01-27 DIAGNOSIS — I4891 Unspecified atrial fibrillation: Secondary | ICD-10-CM

## 2015-01-27 DIAGNOSIS — I483 Typical atrial flutter: Secondary | ICD-10-CM

## 2015-01-27 DIAGNOSIS — J439 Emphysema, unspecified: Secondary | ICD-10-CM

## 2015-01-27 DIAGNOSIS — I10 Essential (primary) hypertension: Secondary | ICD-10-CM

## 2015-01-27 NOTE — Patient Instructions (Signed)
You are doing well.  Please hold the amiodarone  Please call us if you have new issues that need to be addressed before your next appt.  Your physician wants you to follow-up in: 6 months.  You will receive a reminder letter in the mail two months in advance. If you don't receive a letter, please call our office to schedule the follow-up appointment.

## 2015-01-27 NOTE — Progress Notes (Signed)
[name], male    DOB: 04-07-1946, 69 y.o.   MRN: [id number]  HPI Comments: Mr. [name] is a 69 year old gentleman, patient of Dr. Rebecka Apley, with history of hypertension, diabetes, COPD, polycythemia who was sent from oncology to the hospital for tachycardia. Cardiology evaluated the patient 10/29/2014. EKG showed atrial flutter with heart rate 140s up to 150. He has a long smoking history for 45 years. He also reported having 2-3 weeks of cough with yellow phlegm.  In the hospital he was started on beta blockers, diltiazem, digoxin, eventually started on amiodarone infusion with anticoagulation, eliquis. Treatment was started for suspected pneumonia Initially started on levofloxacin but had an allergic reaction with diffuse rash. Change to azithromycin and Rocephin. He had TEE and cardioversion on 10/30/2014. Normal sinus rhythm was successfully restored and he was discharged in normal sinus rhythm Follow-up in the office on his last visit showed he had recurrent atrial flutter.  He underwent recent cardioversion on 01/07/2015 which successfully restored normal sinus rhythm In follow-up today he reports that he feels well with no complaints, denies any shortness of breath, no significant fatigue. He was surprised to find he is converted back to atrial flutter. Reports his lungs have been doing better, still with occasional cough and shortness of breath   EKG on today's visit shows atrial flutter with ventricular rate 69 bpm  Other past medical history TEE report showed ejection fraction 45-50%, right and left atrium mildly enlarged, RV mildly dilated with mildly reduced systolic function, small amount of plaque in the aorta CT scan of the chest 10/29/2014 in the hospital showed bilateral predominantly lower lobe pulmonary opacity most consistent with pneumonia      Allergies  Allergen Reactions  . Levaquin [Levofloxacin In D5w] Rash    Outpatient Encounter  Prescriptions as of 01/27/2015  Medication Sig  . amiodarone (PACERONE) 200 MG tablet Take 1 tablet (200 mg total) by mouth 2 (two) times daily.  Marland Kitchen apixaban (ELIQUIS) 5 MG TABS tablet Take 5 mg by mouth 2 (two) times daily.  . budesonide-formoterol (SYMBICORT) 80-4.5 MCG/ACT inhaler Inhale 1 puff into the lungs 2 times daily at 12 noon and 4 pm.  . Cyanocobalamin 1000 MCG TBCR Take 1,000 mcg by mouth daily.  . digoxin (LANOXIN) 0.25 MG tablet Take 1 tablet (0.25 mg total) by mouth daily.  Marland Kitchen diltiazem (CARDIZEM CD) 180 MG 24 hr capsule Take 1 capsule (180 mg total) by mouth 2 (two) times daily.  . finasteride (PROSCAR) 5 MG tablet Take 5 mg by mouth daily.  . furosemide (LASIX) 20 MG tablet Take 20 mg by mouth daily.  Marland Kitchen glimepiride (AMARYL) 1 MG tablet Take 1 mg by mouth as needed.  . metFORMIN (GLUCOPHAGE) 1000 MG tablet Take 1 tablet (1,000 mg total) by mouth 2 (two) times daily with a meal.  . metoprolol succinate (TOPROL-XL) 50 MG 24 hr tablet Take 1 tablet (50 mg total) by mouth daily. Take with or immediately following a meal.  . rosuvastatin (CRESTOR) 10 MG tablet Take 10 mg by mouth daily.  . tamsulosin (FLOMAX) 0.4 MG CAPS capsule Take 0.4 mg by mouth.  . tiotropium (SPIRIVA) 18 MCG inhalation capsule Place 1 puff into inhaler and inhale daily.    Past Medical History  Diagnosis Date  . Hyperlipidemia   . Hypertension   . Diabetes mellitus without complication   . Tachycardia   . Polycythemia   . A-fib   . Atrial flutter   . COPD (chronic  obstructive pulmonary disease)     Past Surgical History  Procedure Laterality Date  . Tee with cardioversion  12/16    Social History  reports that he quit smoking about 2 years ago. His smoking use included Cigarettes. He has a 67.5 pack-year smoking history. He does not have any smokeless tobacco history on file. He reports that he drinks about 7.2 oz of alcohol per week. He reports that he does not use illicit drugs.  Family  History Family history is unknown by patient.  Review of Systems  Constitutional: Negative.   Respiratory: Negative.   Cardiovascular: Negative.   Gastrointestinal: Negative.   Musculoskeletal: Negative.   Skin: Negative.   Neurological: Negative.   Hematological: Negative.   Psychiatric/Behavioral: Negative.   All other systems reviewed and are negative.   BP 120/60 mmHg  Pulse 69  Ht 5\' 8"  (1.727 m)  Wt 220 lb (99.791 kg)  BMI 33.46 kg/m2  Physical Exam  Constitutional: He is oriented to person, place, and time. He appears well-developed and well-nourished.  HENT:  Head: Normocephalic.  Nose: Nose normal.  Mouth/Throat: Oropharynx is clear and moist.  Eyes: Conjunctivae are normal. Pupils are equal, round, and reactive to light.  Neck: Normal range of motion. Neck supple. No JVD present.  Cardiovascular: Normal rate, regular rhythm, S1 normal, S2 normal, normal heart sounds and intact distal pulses.  Exam reveals no gallop and no friction rub.   No murmur heard. Pulmonary/Chest: Effort normal. No respiratory distress. He has no rales. He exhibits no tenderness.  Abdominal: Soft. Bowel sounds are normal. He exhibits no distension. There is no tenderness.  Musculoskeletal: Normal range of motion. He exhibits no edema or tenderness.  Lymphadenopathy:    He has no cervical adenopathy.  Neurological: He is alert and oriented to person, place, and time. Coordination normal.  Skin: Skin is warm and dry. No rash noted. No erythema.  Psychiatric: He has a normal mood and affect. His behavior is normal. Judgment and thought content normal.      Assessment and Plan   Nursing note and vitals reviewed.

## 2015-01-27 NOTE — Assessment & Plan Note (Signed)
He has follow-up with pulmonary. Suspect underlying COPD is exacerbating his cardiac issues. Otherwise seems stable at this time

## 2015-01-27 NOTE — Assessment & Plan Note (Signed)
Back in atrial flutter on today's visit. Discussed the various treatment options with him. Suspect he will convert back to atrial flutter and will not proceed with cardioversion again. Likely exacerbated by underlying lung disease. We did discuss possible ablation but is not having any symptoms. We will hold the amiodarone and monitor him for now.

## 2015-01-27 NOTE — Assessment & Plan Note (Signed)
Blood pressure is well controlled on today's visit. No changes made to the medications. 

## 2015-02-03 ENCOUNTER — Ambulatory Visit: Payer: PPO | Admitting: Internal Medicine

## 2015-02-04 ENCOUNTER — Encounter: Payer: Self-pay | Admitting: Internal Medicine

## 2015-02-04 ENCOUNTER — Ambulatory Visit (INDEPENDENT_AMBULATORY_CARE_PROVIDER_SITE_OTHER): Payer: PPO | Admitting: Internal Medicine

## 2015-02-04 VITALS — BP 130/74 | HR 57 | Temp 98.0°F | Ht 67.0 in | Wt 221.6 lb

## 2015-02-04 DIAGNOSIS — J432 Centrilobular emphysema: Secondary | ICD-10-CM

## 2015-02-04 NOTE — Assessment & Plan Note (Addendum)
Currently COPD is well controlled  Given optimal treatment with Symbicort and Spiriva for a number of years by her primary care physician. Patient is currently asymptomatic, only major complaint is some mild dyspnea on severe exertion. CAT 2,  mMRC 0 His COPD is stable and well controlled. Review of his last CT in 10/2014 does not show significant structural lung disease. While in office spiro does show mod-severe obstruction he is clinically asymptomatic and well controlled with Spiriva and Symbicort.   Plan: - follow up in 3 months - stable mild COPD - Continue abstaining from tobacco - Continue with Spiriva and Symbicort - full PFTs prior to next visit.

## 2015-02-04 NOTE — Patient Instructions (Signed)
Follow up with Dr. Stevenson Clinch in 3 months. - continue with your inhalers (symbicort and spiriva) - full pfts prior to next visit.

## 2015-02-04 NOTE — Progress Notes (Signed)
MRN# [id number] [name] [birth year]   CC: Chief Complaint  Patient presents with  . 1 month follow up    Breathing doing well - no SOB, wheezing, chest tightness/CP, or cough at this time.      Brief History: HPI 01/03/15 -69 year old male with a past medical history significant for atrial flutter, COPD, hypertension referred by Dr.Golan for COPD optimization and preop pulmonary evaluation prior to scheduled cardioversion for atrial flutter. Patient had a recent admission to the [facility] in December 2050 for 8 days, he was noted to be tachycardic at his oncology office for which he is following fall polycythemia vera do not citation he was noted to be in atrial flutter, also noted to have a pneumonia (chest x-ray showed patchy airspace disease); at that time he was treated with antibiotics and steroids. Patient states that he's never been hospitalized for breathing issues are her to be intubated for any type of respiratory distress. He states that he gets a yearly cold but does not need admission for antibiotics every year. His primary care physician has been on Symbicort and Spiriva for which he uses on a regular basis and rinses afterwards. He does not endorse a morning cough, or sputum production. He quit smoking 2 years ago. He is currently following with cardiology and will have a scheduled cardioversion for atrial flutter early next week. Plan - COPD well controlled on Symbicort and Spiriva, follow up in 1 month after cardioversion  Events since last clinic visit: Patient presents today for a follow up visit of his COPD. Denies cough, worsening sob at rest or with exertion, no wheezing. He has not smoked since 2014. Currently, doing well respiratory wise - using inhalers as directed with good compliance.  Had cardioversion in February for atrial fibrillation.     PMHX:   Past Medical History  Diagnosis Date  . Hyperlipidemia   . Hypertension   .  Diabetes mellitus without complication   . Tachycardia   . Polycythemia   . A-fib   . Atrial flutter   . COPD (chronic obstructive pulmonary disease)    Surgical Hx:  Past Surgical History  Procedure Laterality Date  . Tee with cardioversion  12/16   Family Hx:  Family History  Problem Relation Age of Onset  . Family history unknown: Yes   Social Hx:   History  Substance Use Topics  . Smoking status: Former Smoker -- 1.50 packs/day for 45 years    Types: Cigarettes    Quit date: 11/27/2012  . Smokeless tobacco: Not on file  . Alcohol Use: 7.2 oz/week    12 Cans of beer per week   Medication:   Current Outpatient Rx  Name  Route  Sig  Dispense  Refill  . apixaban (ELIQUIS) 5 MG TABS tablet   Oral   Take 5 mg by mouth 2 (two) times daily.         . budesonide-formoterol (SYMBICORT) 80-4.5 MCG/ACT inhaler   Inhalation   Inhale 1 puff into the lungs 2 times daily at 12 noon and 4 pm.         . Cyanocobalamin 1000 MCG TBCR   Oral   Take 1,000 mcg by mouth daily.         . digoxin (LANOXIN) 0.25 MG tablet   Oral   Take 1 tablet (0.25 mg total) by mouth daily.   30 tablet   3   . diltiazem (CARDIZEM CD) 180 MG 24 hr capsule  Oral   Take 1 capsule (180 mg total) by mouth 2 (two) times daily.   60 capsule   3   . finasteride (PROSCAR) 5 MG tablet   Oral   Take 5 mg by mouth daily.         . furosemide (LASIX) 20 MG tablet   Oral   Take 20 mg by mouth daily.         Marland Kitchen glimepiride (AMARYL) 1 MG tablet   Oral   Take 1 mg by mouth as needed.         . metFORMIN (GLUCOPHAGE) 1000 MG tablet   Oral   Take 1 tablet (1,000 mg total) by mouth 2 (two) times daily with a meal.   180 tablet   0   . metoprolol succinate (TOPROL-XL) 50 MG 24 hr tablet   Oral   Take 1 tablet (50 mg total) by mouth daily. Take with or immediately following a meal.   30 tablet   6   . rosuvastatin (CRESTOR) 10 MG tablet   Oral   Take 10 mg by mouth daily.          . tamsulosin (FLOMAX) 0.4 MG CAPS capsule   Oral   Take 0.4 mg by mouth daily.          Marland Kitchen tiotropium (SPIRIVA) 18 MCG inhalation capsule   Inhalation   Place 1 puff into inhaler and inhale daily.         Marland Kitchen amiodarone (PACERONE) 200 MG tablet   Oral   Take 1 tablet (200 mg total) by mouth 2 (two) times daily. Patient not taking: Reported on 02/04/2015   60 tablet   3      Review of Systems: Gen:  Denies  fever, sweats, chills HEENT: Denies blurred vision, double vision, ear pain, eye pain, hearing loss, nose bleeds, sore throat Cvc:  No dizziness, chest pain or heaviness Resp:   Denies cough or sputum porduction, shortness of breath Gi: Denies swallowing difficulty, stomach pain, nausea or vomiting, diarrhea, constipation, bowel incontinence Gu:  Denies bladder incontinence, burning urine Ext:   No Joint pain, stiffness or swelling Skin: No skin rash, easy bruising or bleeding or hives Endoc:  No polyuria, polydipsia , polyphagia or weight change Psych: No depression, insomnia or hallucinations  Other:  All other systems negative  Allergies:  Levaquin  Physical Examination:  VS: BP 130/74 mmHg  Pulse 57  Temp(Src) 98 F (36.7 C) (Oral)  Ht 5\' 7"  (1.702 m)  Wt 221 lb 9.6 oz (100.517 kg)  BMI 34.70 kg/m2  SpO2 95%  General Appearance: No distress  Neuro: EXAM: without focal findings, mental status, speech normal, alert and oriented, cranial nerves 2-12 grossly normal  HEENT: PERRLA, EOM intact, no ptosis, no other lesions noticed Pulmonary:Exam: normal breath sounds., diaphragmatic excursion normal.No wheezing, No rales   Cardiovascular:@ Exam:  Normal S1,S2.  No m/r/g.     Abdomen:Exam: Benign, Soft, non-tender, No masses  Skin:   warm, no rashes, no ecchymosis  Extremities: normal, no cyanosis, clubbing, no edema, warm with normal capillary refill.   Labs results:  BMP No results found for: NA, K, CL, CO2, GLUCOSE, BUN, CREATININE   CBC No flowsheet data  found.   Rad results: none     Assessment and Plan:  43 male presents today for copd follow up visit, recent cardioversion for Afib, now with stable well controlled COPD (Stage A).  COPD type A Currently COPD is well controlled  Given optimal treatment with Symbicort and Spiriva for a number of years by her primary care physician. Patient is currently asymptomatic, only major complaint is some mild dyspnea on severe exertion. CAT 2,  mMRC 0 His COPD is stable and well controlled. Review of his last CT in 10/2014 does not show significant structural lung disease. While in office spiro does show mod-severe obstruction he is clinically asymptomatic and well controlled with Spiriva and Symbicort.   Plan: - follow up in 3 months - stable mild COPD - Continue abstaining from tobacco - Continue with Spiriva and Symbicort - full PFTs prior to next visit.       Updated Medication List Outpatient Encounter Prescriptions as of 02/04/2015  Medication Sig  . apixaban (ELIQUIS) 5 MG TABS tablet Take 5 mg by mouth 2 (two) times daily.  . budesonide-formoterol (SYMBICORT) 80-4.5 MCG/ACT inhaler Inhale 1 puff into the lungs 2 times daily at 12 noon and 4 pm.  . Cyanocobalamin 1000 MCG TBCR Take 1,000 mcg by mouth daily.  . digoxin (LANOXIN) 0.25 MG tablet Take 1 tablet (0.25 mg total) by mouth daily.  Marland Kitchen diltiazem (CARDIZEM CD) 180 MG 24 hr capsule Take 1 capsule (180 mg total) by mouth 2 (two) times daily.  . finasteride (PROSCAR) 5 MG tablet Take 5 mg by mouth daily.  . furosemide (LASIX) 20 MG tablet Take 20 mg by mouth daily.  Marland Kitchen glimepiride (AMARYL) 1 MG tablet Take 1 mg by mouth as needed.  . metFORMIN (GLUCOPHAGE) 1000 MG tablet Take 1 tablet (1,000 mg total) by mouth 2 (two) times daily with a meal.  . metoprolol succinate (TOPROL-XL) 50 MG 24 hr tablet Take 1 tablet (50 mg total) by mouth daily. Take with or immediately following a meal.  . rosuvastatin (CRESTOR) 10 MG tablet Take 10 mg  by mouth daily.  . tamsulosin (FLOMAX) 0.4 MG CAPS capsule Take 0.4 mg by mouth daily.   Marland Kitchen tiotropium (SPIRIVA) 18 MCG inhalation capsule Place 1 puff into inhaler and inhale daily.  Marland Kitchen amiodarone (PACERONE) 200 MG tablet Take 1 tablet (200 mg total) by mouth 2 (two) times daily. (Patient not taking: Reported on 02/04/2015)    Orders for this visit: Orders Placed This Encounter  Procedures  . Pulmonary Function Test    Standing Status: Future     Number of Occurrences:      Standing Expiration Date: 02/05/2016    Order Specific Question:  Where should this test be performed?    Answer:  [facility]    Order Specific Question:  Full PFT: includes the following: basic spirometry, spirometry pre & post bronchodilator, diffusion capacity (DLCO), lung volumes    Answer:  Full PFT    Order Specific Question:  MIP/MEP    Answer:  No    Order Specific Question:  6 minute walk    Answer:  No    Order Specific Question:  ABG    Answer:  No    Order Specific Question:  Diffusion capacity (DLCO)    Answer:  Yes    Order Specific Question:  Lung volumes    Answer:  Yes    Order Specific Question:  Methacholine challenge    Answer:  No    Thank  you for the visitation and for allowing  [facility] to assist in the care of your patient. Our recommendations are noted above.  Please contact us if we can be of further service.  Vilinda Boehringer, MD [facility] Office Number: [phone]

## 2015-02-14 ENCOUNTER — Ambulatory Visit
Admit: 2015-02-14 | Disposition: A | Discharge status: Home / Self Care | Payer: Self-pay | Attending: Internal Medicine | Admitting: Internal Medicine

## 2015-03-08 NOTE — Consult Note (Signed)
General Aspect 69 year old Caucasian male with a history of hypertension, diabetes, chronic obstructive pulmonary disease, polycythemia, was sent by oncologist to the hospital for tachycardia. Cardiology was consulted for atrial flutter.  He reports having no sx apart from a cough for the past few weeks. No PND, orthopnea. No leg edema or SOB with exertion. Today he reports feeling fine, ambulated into the cancer clinic with no sx. In the cancer center, the patient was noticed to have tachycardia at 140s and was sent to ED for further evaluation.    In the ER,  The patient???s chest x-ray and the CAT scans of chest suggested multilobar pneumonia, was treated with Levaquin in the ED.    PAST MEDICAL HISTORY:  Hypertension,  diabetes,  chronic obstructive pulmonary disease,  polycythemia   SOCIAL HISTORY:  Quit smoking 1 year ago. No drinking or illicit drugs.   FAMILY HISTORY:  No hypertension, diabetes, heart attack or stroke.   Physical Exam:  GEN well developed, well nourished, no acute distress   HEENT hearing intact to voice, moist oral mucosa   NECK supple   RESP normal resp effort  clear BS   CARD Irregular rate and rhythm  Tachycardic   ABD denies tenderness  soft   LYMPH negative neck   EXTR negative edema   SKIN normal to palpation   NEURO motor/sensory function intact   PSYCH alert, A+O to time, place, person, good insight   Review of Systems:  Subjective/Chief Complaint cough   General: No Complaints   Skin: No Complaints   ENT: No Complaints   Eyes: No Complaints   Neck: No Complaints   Respiratory: Frequent cough   Cardiovascular: No Complaints   Gastrointestinal: No Complaints   Genitourinary: No Complaints   Vascular: No Complaints   Musculoskeletal: No Complaints   Neurologic: No Complaints   Hematologic: No Complaints   Endocrine: No Complaints   Psychiatric: No Complaints   Review of Systems: All other systems were  reviewed and found to be negative   Medications/Allergies Reviewed Medications/Allergies reviewed     HTN:    Irregular Heart Beat:    Hyperlipidemia:    Diabetes:    Denies:   Home Medications: Medication Instructions Status  metFORMIN 1000 mg oral tablet 1 tab(s) orally 2 times a day Active  fenofibrate 145 mg oral tablet 1 tab(s) orally once a day Active  loratadine 10 mg oral tablet 1 tab(s) orally once a day Active  tamsulosin 0.4 mg oral capsule 1 cap(s) orally once a day Active  finasteride 5 mg oral tablet 1 tab(s) orally once a day Active  Spiriva 18 mcg inhalation capsule 1 cap(s) inhaled once a day Active  carvedilol 6.25 mg oral tablet 1 tab(s) orally 2 times a day Active  aspirin 81 mg oral tablet 1 tab(s) orally once a day Active  Altace 5 mg oral capsule 1 cap(s) orally once a day Active  simvastatin 40 mg oral tablet 1 tab(s) orally once a day (at bedtime) Active  Symbicort 80 mcg-4.5 mcg/inh inhalation aerosol 2 puff(s) inhaled every 12 hours Active   Lab Results:  Hepatic:  15-Dec-15 10:39   Bilirubin, Total 0.8  Alkaline Phosphatase 49 (46-116 NOTE: New Reference Range 06/04/14)  SGPT (ALT) 24 (14-63 NOTE: New Reference Range 06/04/14)  SGOT (AST)  12  Total Protein, Serum  6.3  Albumin, Serum  3.0  Routine Chem:  15-Dec-15 10:39   B-Type Natriuretic Peptide ([facility])  923 (Result(s) reported on 29 Oct 2014 at 12:22PM.)  Result Comment HEMOGRAMPLATELET - IMMATURECELLS SEEN ON SLIDE ORDER  - DIFF IF NEEDED  Result(s) reported on 29 Oct 2014 at 11:06AM.  Glucose, Serum  131  BUN 10  Creatinine (comp) 1.11  Sodium, Serum  134  Potassium, Serum 4.7  Chloride, Serum 101  CO2, Serum 27  Calcium (Total), Serum 9.4  Osmolality (calc) 279  eGFR (African American) >60  eGFR (Non-African American) >60 (eGFR values <71m/min/1.73 m2 may be an indication of chronic kidney disease (CKD). Calculated eGFR, using the MRDR Study equation, is useful in   patients with stable renal function. The eGFR calculation will not be reliable in acutely ill patients when serum creatinine is changing rapidly. It is not useful in patients on dialysis. The eGFR calculation may not be applicable to patients at the low and high extremes of body sizes, pregnant women, and vegetarians.)  Anion Gap  6  Cardiac:  15-Dec-15 10:39   Troponin I < 0.02 (0.00-0.05 0.05 ng/mL or less: NEGATIVE  Repeat testing in 3-6 hrs  if clinically indicated. >0.05 ng/mL: POTENTIAL  MYOCARDIAL INJURY. Repeat  testing in 3-6 hrs if  clinically indicated. NOTE: An increase or decrease  of 30% or more on serial  testing suggests a  clinically important change)  CK, Total 64  CPK-MB, Serum 1.7 (Result(s) reported on 29 Oct 2014 at 11:55AM.)  Routine Coag:  15-Dec-15 10:39   D-Dimer, Quantitative 961 (INTERPRETATION <> Exclusion of Venous Thromboembolism (VTE) - OUTPATIENT ONLY       (Emergency Department or [place])             0-499 ng/ml (FEU)  : With a low to intermediate pretest                                  probability for VTE this test result                                  excludes the diagnosis of VTE.             > 499 ng/ml (FEU)  : VTE not excluded; additional work up                                  for VTE is required. <> Testing on Inpatients and Evaluation of Disseminated Intravascular        Coagulation (DIC)             Reference Range:  0-499 ng/ml (FEU))  Prothrombin 12.9  INR 1.0 (INR reference interval applies to patients on anticoagulant therapy. A single INR therapeutic range for coumarins is not optimal for all indications; however, the suggested range for most indications is 2.0 - 3.0. Exceptions to the INR Reference Range may include: Prosthetic heart valves, acute myocardial infarction, prevention of myocardial infarction, and combinations of aspirin and anticoagulant. The need for a higher or lower target INR must be assessed  individually. Reference: The Pharmacology and Management of the Vitamin K  antagonists: the seventh ACCP Conference on Antithrombotic and Thrombolytic Therapy. CSWNIO.2703Sept:126 (3suppl): [id number] A HCT value >55% may artifactually increase the PT.  In one study,  the increase was an average of 25%. Reference:  "Effect on Routine and Special Coagulation Testing Values of Citrate Anticoagulant Adjustment  in Patients with High HCT Values." American Journal of Clinical Pathology 4709;628:366-294.)  Activated PTT (APTT) 29.1 (A HCT value >55% may artifactually increase the APTT. In one study, the increase was an average of 19%. Reference: "Effect on Routine and Special Coagulation Testing Values of Citrate Anticoagulant Adjustment in Patients with High HCT Values." American Journal of Clinical Pathology 2006;126:400-405.)  Routine Hem:  15-Dec-15 10:39   WBC (CBC)  12.3  RBC (CBC) 5.22  Hemoglobin (CBC) 17.1  Hematocrit (CBC) 52.0  Platelet Count (CBC) 237  MCV 100  MCH 32.8  MCHC 32.9  RDW 12.6   EKG:  Interpretation EKG shows atrial flutter with ratye 140 bpm   Radiology Results: XRay:    15-Dec-15 10:37, Chest Portable Single View  Chest Portable Single View   REASON FOR EXAM:    Chest Pain  COMMENTS:       PROCEDURE: DXR - DXR PORTABLE CHEST SINGLE VIEW  - Oct 29 2014 10:37AM     CLINICAL DATA:  Right upper quadrant pain, epigastric pain    EXAM:  PORTABLE CHEST - 1 VIEW    COMPARISON:  None.    FINDINGS:  Mild bilateral interstitial thickening and patchy areas of airspace  disease. No pleural effusion or pneumothorax. Stable  cardiomediastinal silhouette. No acute osseous abnormality.     IMPRESSION:  Bilateral mild interstitial thickening and patchyareas of airspace  disease. Overall appearance is concerning for multilobar pneumonia.      Electronically Signed    By: Kathreen Devoid    On: 10/29/2014 11:10         Verified By: Jennette Banker, M.D., MD     Bee Stings: Unknown  Vital Signs/Nurse's Notes: **Vital Signs.:   15-Dec-15 20:00  Vital Signs Type Routine  Temperature Temperature (F) 98.6  Celsius 37  Temperature Source oral  Pulse Pulse 114  Respirations Respirations 11  Systolic BP Systolic BP 765  Diastolic BP (mmHg) Diastolic BP (mmHg) 75  Mean BP 83  Pulse Ox % Pulse Ox % 96  Oxygen Delivery Room Air/ 21 %    Impression 69 year old Caucasian male with a history of hypertension, diabetes, chronic obstructive pulmonary disease, polycythemia, was sent by oncologist to the hospital for tachycardia. Cardiology was consulted for atrial flutter.  1) atrial flutter timing if onset is unclear, possibly causing his cough that started several weeks ago No other signs of CHF on exam. He denies any sx apart from a cough. Rate has been challenging to control. Minimal improvement wit b-blockers, diltiazem and digoxin --will give additional dose of digoxin, metoprolol Start amiodarone infusion for rate control and start eliquis 5 mg po BID  2.  Pneumonia.  cough, though unclear if this is from diastolic CHF/atrial flutter or PNA no other clinical signs of PNA  3.   Polycythemia.  recently seen by hematology. HCT >50   4.  Hypertension.  Hold outpt meds while trying to control heart rate  5.  Diabetes.  Per medical service  6.  Hyperlipidemia,  on a statin, would continue  7.  Questionable obstructive sleep apnea.   8. COPD on out pt inhalers   Electronic Signatures: Ida Rogue (MD)  (Signed 15-Dec-15 23:01)  Authored: General Aspect/Present Illness, History and Physical Exam, Review of System, Past Medical History, Health Issues, Home Medications, Labs, EKG , Radiology, Allergies, Vital Signs/Nurse's Notes, Impression/Plan   Last Updated: 15-Dec-15 23:01 by Ida Rogue (MD)

## 2015-03-08 NOTE — H&P (Signed)
PATIENT NAME:  [name] MR#:  [id number] DATE OF BIRTH:  30-Nov-1945  DATE OF ADMISSION:  10/29/2014  PRIMARY CARE PHYSICIAN:   [name], MD  CHIEF COMPLAINT:  Tachycardia.   HISTORY OF PRESENT ILLNESS:  A 69 year old Caucasian male with a history of hypertension, diabetes, chronic obstructive pulmonary disease, questionable OSA, was sent by oncologist due to tachycardia. The patient is alert, awake, oriented, in no acute distress. The patient complains of cough with yellowish sputum for the past 2 to 3 weeks, but the patient denies any fever or chills. No shortness of breath, chest pain or palpitations.  Denies any other symptoms.  No orthopnea, nocturnal dyspnea. No leg edema. The patient was noticed to have tachycardia at 140s in the oncologist's office, was sent to ED for further evaluation.  Dr. Lavone Neri oncologist, discussed with me about the patient's condition.  The patient has polycythemia and possible OSA.  The patient needs a pulmonary consult.   The patient's chest x-ray and the CAT scans of chest showed multilobar pneumonia, was treated with Levaquin in the ED.   PAST MEDICAL HISTORY: Hypertension, diabetes, chronic obstructive pulmonary disease, polycythemia and questionable OSA.   SOCIAL HISTORY: Quit smoking 1 year ago. No drinking or illicit drugs.   FAMILY HISTORY: No hypertension, diabetes, heart attack or stroke.   ALLERGIES: BEE STINGS.   HOME MEDICATIONS: Flomax 0.4 mg p.o. daily, Symbicort 80 mcg/4.5 mcg inhalation 2 puffs every 12 hours, Spiriva 18 mcg 1 cap once a day, Zocor 40 mg p.o. daily at bedtime, metformin 1000 mg p.o. b.i.d., loratadine 10 mg p.o. daily, finasteride 5 mg p.o. daily, fenofibrate 140 mg p.o. daily, Coreg 6.25 mg p.o. b.i.d., aspirin 81 mg p.o. daily, Altace 5 mg p.o. daily.   REVIEW OF SYSTEMS:  CONSTITUTIONAL: The patient denies any fever or chills. No headache or dizziness.  No weakness.  EYES: No double vision, vision or blurry vision.   EARS, NOSE, AND THROAT: No postnasal drip, slurred speech or dysphagia.  CARDIOVASCULAR: No chest pain, palpitation, orthopnea, nocturnal dyspnea. No leg edema.  PULMONARY: Positive for cough, sputum, but no shortness of breath or hematemesis. No wheezing.  GASTROINTESTINAL:  No abdominal pain, nausea, vomiting, diarrhea. No melena or bloody stool. GENITOURINARY: No dysuria, hematuria, or incontinence.  SKIN: No rash or jaundice.  NEUROLOGY: No syncope, loss of consciousness, or seizure.  ENDOCRINOLOGY: No polyuria, polydipsia, heat or cold intolerance.  NEUROLOGY: No syncope.  HEMATOLOGY: No easy bruising or bleeding.   PHYSICAL EXAMINATION:  VITAL SIGNS: Temperature 97.8, blood pressure 193/67, pulse 139, oxygen saturation 100% on room air.  GENERAL: The patient is alert, awake, oriented, in no acute distress.  HEENT: Pupils round, equal and reactive to light and accommodation.   NECK: Supple. No JVD or carotid bruit. No lymphadenopathy or thyromegaly.  CARDIOVASCULAR: S1 and S2. Regular rate, tachycardia, no murmurs or gallops.  PULMONARY: Bilateral limited air entry. Very weak lung sounds. No crackles. No wheezing. No use of accessory muscle to breathe.  ABDOMEN: Soft, obese no tenderness or distention. No organomegaly. Bowel sounds present.  EXTREMITIES: No edema, clubbing or cyanosis. No calf tenderness. Bilateral pedal pulses present.  SKIN: No rash or jaundice.  NEUROLOGY: A and O x 3. No focal deficit. Power 5/5. Sensory intact.   LABORATORY DATA: CT angiogram of the chest showed no pulmonary embolism, but has bilateral lower lobe pulmonary opacity. Lactic acid 2.3, troponin less than 0.0.2, INR 1.1. WBC 12.3, hemoglobin 17.1, platelets 237,000.  Glucose 131,  BUN 10, creatinine 1.11, sodium 134.  Electrolytes were normal.  The d-dimer 961.  BNP 923.  Chest x-ray bilateral mild interstitial thickening in the patchy area of air space disease concerning multilobar pneumonia.  Urinalysis is negative.   IMPRESSIONS:  1.  Pneumonia.  2.  Sepsis.  3.  Tachycardia.  4.  Polycythemia.  5.  Hypertension.  6.  Diabetes.  7.  Hyperlipidemia,  8.  Questionable obstructive sleep apnea.   PLAN OF TREATMENT:  1.  The patient will be admitted to stepdown unit for acute severe tachycardia. The patient was treated with normal saline bolus without improvement. Blood pressure is about 93/57. We will continue IV fluid support. Hold, Coreg and Altace due to low blood pressure.  2.  For pneumonia and sepsis, continue Levaquin, follow up blood culture, sputum culture and a CBC.  3.  For diabetes, we will start a sliding scale, hold metformin.  4.  For polycythemia, follow up CBC.   The patient can follow up oncology as an outpatient.  Polycythemia is possibly secondary to chronic obstructive pulmonary disease.  5.  For chronic obstructive pulmonary disease, the patient probably has a severe chronic obstructive pulmonary disease but no exacerbation at this time.  We will continue the patient's home medications, Spiriva, Symbicort and we will get a pulmonary consult.   6.  For tachycardia, EKG shows SVT. We will treat pneumonia with antibiotics and IV fluid. We will monitor the patient's heart rate in stepdown unit.  If there is no improvement in tachycardia, we will request a cardiology consult and get an echocardiograph.  Tachycardia is possibly due to underlying disease of pneumonia and chronic obstructive pulmonary disease.  7.  I discussed the patient's condition and plan of treatment with the patient, the nurse and Dr. Lavone Neri oncologist.  Dr. Lavone Neri suggests a pulmonary consult.   CRITICAL TIME SPENT: About 63 minutes.    ____________________________ Demetrios Loll, MD qc:DT D: 10/29/2014 14:03:50 ET T: 10/29/2014 14:41:08 ET JOB#: [id number]  cc: Demetrios Loll, MD, <Dictator> Demetrios Loll MD ELECTRONICALLY SIGNED 10/29/2014 17:59

## 2015-03-12 NOTE — Discharge Summary (Signed)
PATIENT NAME:  [name], [name] MR#:  [id number] DATE OF BIRTH:  05/28/1946  DATE OF ADMISSION:  10/29/2014 DATE OF DISCHARGE:  11/05/2014  ADMITTING DIAGNOSES:  1. Sepsis with pneumonia.  2. Tachycardia.  3. Polycythemia.  4. Hypertension. 5. Diabetes. 6. Hyperlipidemia.  7. Questionable obstructive sleep apnea.   DISCHARGE DIAGNOSES:  1. ALLERGIC REACTION TO A DRUG. 2. Atrial fibrillation and atrial flutter.  3. Bilateral pneumonia.   PROCEDURES: TEE and cardioversion on December 16.  CONSULTATIONS: Cardiology, Dr. Rockey Situ.   BRIEF HISTORY: The patient is a 69 year old, Caucasian male, who came into the ED with a chief complaint of tachycardia. Please review history and physical for details. The patient has a questionable history of obstructive sleep apnea. He was seen by oncology for polycythemia, and he was sent over to the ED for tachycardia. The patient was complaining of cough with yellowish phlegm for 2 to 3 weeks, but denies any fevers or chills. The patient was admitted to the stepdown unit from the ED for severe tachycardia. He was given IV fluid boluses and was started on IV antibiotic, levofloxacin. Blood cultures and sputum cultures were obtained. He had severe COPD, but not found in any exacerbation at the time of admission.   HOSPITAL COURSE: The patient was found to be in atrial fibrillation with flutter. He was seen by cardiology, Dr. Rockey Situ. The patient had a TEE and cardioversion done on December 16 successfully, and restored back to normal sinus rate and rhythm. Unfortunately, he converted back to atrial flutter on December 17 a.m. He was placed on amiodarone infusion. Diltiazem was given. Amiodarone was started at 400 mg p.o. b.i.d. Digoxin was added to the regimen at 0.25 mg p.o. daily. Subsequently, diltiazem was changed to 180 mg p.o. b.i.d. Eliquis was added to the regimen, and the patient is to take it 5 mg p.o. b.i.d. Aspirin was discontinued. Case management was  consulted regarding the  Eliquis coverage. Eliquis co-pay card was given, which will provide a 22-month free supply. Cardiology has recommended to change the patient's amiodarone from 400 mg p.o. b.i.d. to 200 mg p.o. b.i.d. after 3 days.   The patient's pneumonia was treated initially with IV levofloxacin, but the patient developed an ALLERGIC REACTION TO LEVOFLOXACIN, so that was discontinued, and the patient was started on IV Rocephin and azithromycin. The patient tolerated Rocephin and azithromycin well, and the patient was continued on the abx  during the hospital course. At the time of discharge, as he was clinically stable, he was given p.o. antibiotics.   The patient developed a generalized allergic dermatitis on December 21. He was recommended to take Benadryl, but he refused to take Benadryl as it makes him sleepy. As the rash had gotten worse, he agreed to take Benadryl on the next day. The patient was placed on scheduled Benadryl and prednisone was also given after giving 1 dose of IV Solu-Medrol. No shortness of breath, throat closing, or laryngeal edema was noticed. The patient was also recommended to continue Pepcid after discharge. After starting steroids and the Benadryl, his rash started fading away. The patient felt comfortable to be discharged.   Polycythemia. The patient was recommended to follow up with the [facility], with Oncology, as an outpatient as scheduled.    For hypertension, medications were adjusted as the Cardizem dose was changed and increased to 180 mg p.o. b.i.d. in view of atrial fibrillation and flutter. The patient used to take Coreg at home.   Diabetes. The patient is  supposed to take insulin sliding scale.   Overall condition being stable, the decision was made to discharge the patient home on December 22.   PHYSICAL EXAMINATION: GENERAL APPEARANCE: Not in any acute distress, obese.  HEENT: Normocephalic, atraumatic. Pupils are equally reactive to light and  accommodation. Moist mucous membranes.  NECK: Supple. No JVD.  LUNGS: No accessory muscle use and normal respiratory effort. CARDIOLOGY: Atrial flutter. No JVD.  GASTROINTESTINAL: Soft. Bowel sounds are positive in all 4 quadrants, nontender, nondistended.  NEUROLOGIC: Awake, alert, and oriented x 3. Motor and sensory are grossly intact. Follows verbal commands. PSYCHIATRIC: Alert and oriented x 3.  SKIN: With a diffuse macular papular rash, is confluent, more prominent on the lower extremities, which is slowly fading. No pustules or discharge noticed.  VITAL SIGNS: On the day of discharge temperature 97.5, pulse 73, respirations 20, blood pressure 117/67, pulse oximetry 92% at rest on room air.   MEDICATIONS AT THE TIME OF DISCHARGE: Metformin 1000 mg p.o. b.i.d., fenofibrate 145 mg p.o. once daily, loratadine 10 mg p.o. once daily, tamsulosin 0.4 mg p.o. 1 capsule once daily, finasteride 5 mg p.o. once daily, Spiriva one capsule  inh once daily, Coreg 6.25 mg p.o. b.i.d., Altace 5 mg 1 capsule p.o. once a day, Simvastatin 40 mg p.o. once daily at bedtime, Symbicort 2 puffs inhalations every 12 hours. Prednisone 10 mg 5 tablets p.o. once daily for 1 day, 4 tablets for 1 day, then 3 tablets p.o. for 1 day, 2 tablets p.o. for 1 day, 1 tablet p.o. for 1 day. Amiodarone 200 mg 2 tablets p.o. 2 times a day for 3 days and then to take 1 tablet p.o. b.i.d. as recommended by cardiology. Eliquis 5 mg p.o. b.i.d., Benadryl 25 mg p.o. 2 times a day, azithromycin 250 mg p.o. once daily for a total of a 5-day course, diltiazem 180 mg 1 capsule p.o. 2 times a day, digoxin 250 mcg 1 tablet p.o. once daily, Pepcid 20 mg p.o. b.i.d. Discontinue aspirin 81 mg.   DISCHARGE DIET: Low sodium, low fat, diabetic diet.   FOLLOWUP: With primary care physician in 1 week. Dr. Rockey Situ in 1 week. Oncology as scheduled as an outpatient. Primary care physician to refer the patient to pulmonology as a consult for questionable  obstructive sleep apnea.   SIGNIFICANT LABORATORIES AND IMAGING STUDIES: On December 20 the patient had a chest x-ray, PA and lateral views, hazy bilateral lower lobe airspace disease compatible with pneumonia.   The patient's white count on December 21 was 13.8, hemoglobin 17.8, hematocrit 53.6, platelet count 245.   The diagnosis and plan of care was discussed in detail with the patient by me, as well as by Dr. Rockey Situ. He verbalized understanding of the plan. Dr. Rockey Situ has recommended the patient to call him if his rash gets worse as the patient was started on amiodarone, Eliquis and other medications. The patient verbalized understanding of the plan.   TOTAL TIME SPENT ON THE DISCHARGE: 45 minutes.   Condition at the time of discharge is stable.    ____________________________ Nicholes Mango, MD ag:JT D: 11/09/2014 20:31:13 ET T: 11/10/2014 07:58:15 ET JOB#: [id number]  cc: Nicholes Mango, MD, <Dictator> [name], MD Primary Care Physician Oncology  Nicholes Mango MD ELECTRONICALLY SIGNED 11/21/2014 23:09

## 2015-04-15 ENCOUNTER — Telehealth: Payer: Self-pay | Admitting: *Deleted

## 2015-04-15 MED ORDER — DILTIAZEM HCL ER COATED BEADS 180 MG PO CP24: 180.0000 mg | ORAL_CAPSULE | Freq: Two times a day (BID) | ORAL | Status: DC

## 2015-04-15 NOTE — Telephone Encounter (Signed)
Refill sent for cartia xt

## 2015-04-15 NOTE — Telephone Encounter (Signed)
°  1. Which medications need to be refilled? Cartia xt  2. Which pharmacy is medication to be sent to? [facility] on [address]   3. Do they need a 30 day or 90 day supply? 60 day  4. Would they like a call back once the medication has been sent to the pharmacy? No

## 2015-04-16 ENCOUNTER — Other Ambulatory Visit: Payer: Self-pay | Admitting: *Deleted

## 2015-04-16 MED ORDER — DILTIAZEM HCL ER COATED BEADS 180 MG PO CP24: 180.0000 mg | ORAL_CAPSULE | Freq: Two times a day (BID) | ORAL | Status: DC

## 2015-04-21 ENCOUNTER — Other Ambulatory Visit: Payer: Self-pay

## 2015-04-21 MED ORDER — DILTIAZEM HCL ER COATED BEADS 180 MG PO CP24: 180.0000 mg | ORAL_CAPSULE | Freq: Two times a day (BID) | ORAL | Status: DC

## 2015-04-21 NOTE — Telephone Encounter (Signed)
Refill sent for cardizem cd 180 mg take one tablet twice a day.

## 2015-05-07 ENCOUNTER — Ambulatory Visit (INDEPENDENT_AMBULATORY_CARE_PROVIDER_SITE_OTHER): Payer: PPO | Admitting: Internal Medicine

## 2015-05-07 ENCOUNTER — Encounter: Payer: Self-pay | Admitting: Internal Medicine

## 2015-05-07 ENCOUNTER — Telehealth: Payer: Self-pay | Admitting: Cardiovascular Disease

## 2015-05-07 VITALS — BP 114/64 | HR 65 | Ht 67.0 in | Wt 209.0 lb

## 2015-05-07 DIAGNOSIS — J432 Centrilobular emphysema: Secondary | ICD-10-CM

## 2015-05-07 LAB — PULMONARY FUNCTION TEST
DL/VA % pred: 84 %
DL/VA: 3.73 ml/min/mmHg/L
DLCO UNC % PRED: 67 %
DLCO UNC: 19.18 ml/min/mmHg
FEF 25-75 Post: 0.92 L/sec
FEF 25-75 Pre: 0.9 L/sec
FEF2575-%CHANGE-POST: 1 %
FEF2575-%PRED-POST: 42 %
FEF2575-%PRED-PRE: 41 %
FEV1-%Change-Post: 9 %
FEV1-%Pred-Post: 68 %
FEV1-%Pred-Pre: 62 %
FEV1-Post: 1.73 L
FEV1-Pre: 1.58 L
FEV1FVC-%Change-Post: 10 %
FEV1FVC-%Pred-Pre: 75 %
FEV6-%Change-Post: 0 %
FEV6-%PRED-POST: 85 %
FEV6-%PRED-PRE: 85 %
FEV6-PRE: 2.74 L
FEV6-Post: 2.72 L
FEV6FVC-%Change-Post: 0 %
FEV6FVC-%Pred-Post: 104 %
FEV6FVC-%Pred-Pre: 104 %
FVC-%Change-Post: 0 %
FVC-%PRED-POST: 80 %
FVC-%PRED-PRE: 81 %
FVC-POST: 2.73 L
FVC-PRE: 2.76 L
PRE FEV6/FVC RATIO: 99 %
Post FEV1/FVC ratio: 63 %
Post FEV6/FVC ratio: 100 %
Pre FEV1/FVC ratio: 57 %

## 2015-05-07 MED ORDER — APIXABAN 5 MG PO TABS: 5.0000 mg | ORAL_TABLET | Freq: Two times a day (BID) | ORAL | Status: DC

## 2015-05-07 NOTE — Progress Notes (Signed)
MRN# [id number] [name] 02-Sep-1946   CC: Chief Complaint  Patient presents with  . Follow-up    PFT results      Brief History: HPI 01/03/15 -69 year old male with a past medical history significant for atrial flutter, COPD, hypertension referred by Dr.Golan for COPD optimization and preop pulmonary evaluation prior to scheduled cardioversion for atrial flutter. Patient had a recent admission to the [facility] in December 2050 for 8 days, he was noted to be tachycardic at his oncology office for which he is following fall polycythemia vera do not citation he was noted to be in atrial flutter, also noted to have a pneumonia (chest x-ray showed patchy airspace disease); at that time he was treated with antibiotics and steroids. Patient states that he's never been hospitalized for breathing issues are her to be intubated for any type of respiratory distress. He states that he gets a yearly cold but does not need admission for antibiotics every year. His primary care physician has been on Symbicort and Spiriva for which he uses on a regular basis and rinses afterwards. He does not endorse a morning cough, or sputum production. He quit smoking 2 years ago. He is currently following with cardiology and will have a scheduled cardioversion for atrial flutter early next week. Plan - COPD well controlled on Symbicort and Spiriva, follow up in 1 month after cardioversion  ROV 02/04/15: Patient presents today for a follow up visit of his COPD. Denies cough, worsening sob at rest or with exertion, no wheezing. He has not smoked since 2014. Currently, doing well respiratory wise - using inhalers as directed with good compliance.  Had cardioversion in February for atrial fibrillation. Plan - cont with spiriva and symbicort, full pfts  Events since last clinic visit: Patient presents today for follow-up visit, overall he states that he is doing well. Since his last visit he has  not had any further ED or urgent care visits for shortness of breath, upper spray tract infection, other breathing issues. Patient states that overall he is doing well, if he overexerts himself he does have some mild shortness of breath but he is not having any tobacco products of any kind. Today he did have his primary function testing done.     Medication:   Current Outpatient Rx  Name  Route  Sig  Dispense  Refill  . amiodarone (PACERONE) 200 MG tablet   Oral   Take 1 tablet (200 mg total) by mouth 2 (two) times daily. Patient not taking: Reported on 02/04/2015   60 tablet   3   . apixaban (ELIQUIS) 5 MG TABS tablet   Oral   Take 5 mg by mouth 2 (two) times daily.         . budesonide-formoterol (SYMBICORT) 80-4.5 MCG/ACT inhaler   Inhalation   Inhale 1 puff into the lungs 2 times daily at 12 noon and 4 pm.         . Cyanocobalamin 1000 MCG TBCR   Oral   Take 1,000 mcg by mouth daily.         . digoxin (LANOXIN) 0.25 MG tablet   Oral   Take 1 tablet (0.25 mg total) by mouth daily.   30 tablet   3   . diltiazem (CARDIZEM CD) 180 MG 24 hr capsule   Oral   Take 1 capsule (180 mg total) by mouth 2 (two) times daily.   60 capsule   3   . finasteride (PROSCAR) 5  MG tablet   Oral   Take 5 mg by mouth daily.         . furosemide (LASIX) 20 MG tablet   Oral   Take 20 mg by mouth daily.         Marland Kitchen glimepiride (AMARYL) 1 MG tablet   Oral   Take 1 mg by mouth as needed.         . metFORMIN (GLUCOPHAGE) 1000 MG tablet   Oral   Take 1 tablet (1,000 mg total) by mouth 2 (two) times daily with a meal.   180 tablet   0   . metoprolol succinate (TOPROL-XL) 50 MG 24 hr tablet   Oral   Take 1 tablet (50 mg total) by mouth daily. Take with or immediately following a meal.   30 tablet   6   . rosuvastatin (CRESTOR) 10 MG tablet   Oral   Take 10 mg by mouth daily.         . tamsulosin (FLOMAX) 0.4 MG CAPS capsule   Oral   Take 0.4 mg by mouth daily.           Marland Kitchen tiotropium (SPIRIVA) 18 MCG inhalation capsule   Inhalation   Place 1 puff into inhaler and inhale daily.            Review of Systems: Gen:  Denies  fever, sweats, chills HEENT: Denies blurred vision, double vision, ear pain, eye pain, hearing loss, nose bleeds, sore throat Cvc:  No dizziness, chest pain or heaviness Resp:   Admits to: Mild shortness of breath with exertion Gi: Denies swallowing difficulty, stomach pain, nausea or vomiting, diarrhea, constipation, bowel incontinence Gu:  Denies bladder incontinence, burning urine Ext:   No Joint pain, stiffness or swelling Skin: No skin rash, easy bruising or bleeding or hives Endoc:  No polyuria, polydipsia , polyphagia or weight change Other:  All other systems negative  Allergies:  Levaquin  Physical Examination:  VS: BP 114/64 mmHg  Pulse 65  Ht 5\' 7"  (1.702 m)  Wt 209 lb (94.802 kg)  BMI 32.73 kg/m2  SpO2 92%  General Appearance: No distress  HEENT: PERRLA, no ptosis, no other lesions noticed Pulmonary:normal breath sounds., diaphragmatic excursion normal.No wheezing, No rales   Cardiovascular:  Normal S1,S2.  No m/r/g.     Abdomen:Exam: Benign, Soft, non-tender, No masses  Skin:   warm, no rashes, no ecchymosis  Extremities: normal, no cyanosis, clubbing, warm with normal capillary refill.     Pulmonary function testing 05/07/2015 FEV1 62% FEV1/FVC 57% RV 136 TLC 93 RV/TLC 144 DLCO uncorrected 67% Impression: Moderate obstruction with no cervical broncho-dilator response. Moderate decrease in DLCO.      Assessment and Plan: 69 year old male past medical history of tobacco abuse, seen in consultation for COPD follow-up. COPD type A Currently COPD is well controlled  Given optimal treatment with Symbicort and Spiriva for a number of years by her primary care physician. Patient is currently asymptomatic, only major complaint is some mild dyspnea on severe exertion. CAT 2,  mMRC 0 His COPD is  stable and well controlled. Review of his last CT in 10/2014 does not show significant structural lung disease. Pulmonary function testing 05/07/2015: FEV1 60%, FEV1/FVC 57%. Moderate obstruction and a second dilator response, moderate decrease in DLCO.  Plan: - follow up in 6 months - stable mild COPD - Continue abstaining from tobacco - Continue with Spiriva and Symbicort       Updated Medication List Outpatient  Encounter Prescriptions as of 05/07/2015  Medication Sig  . amiodarone (PACERONE) 200 MG tablet Take 1 tablet (200 mg total) by mouth 2 (two) times daily. (Patient not taking: Reported on 02/04/2015)  . apixaban (ELIQUIS) 5 MG TABS tablet Take 5 mg by mouth 2 (two) times daily.  . budesonide-formoterol (SYMBICORT) 80-4.5 MCG/ACT inhaler Inhale 1 puff into the lungs 2 times daily at 12 noon and 4 pm.  . Cyanocobalamin 1000 MCG TBCR Take 1,000 mcg by mouth daily.  . digoxin (LANOXIN) 0.25 MG tablet Take 1 tablet (0.25 mg total) by mouth daily.  Marland Kitchen diltiazem (CARDIZEM CD) 180 MG 24 hr capsule Take 1 capsule (180 mg total) by mouth 2 (two) times daily.  . finasteride (PROSCAR) 5 MG tablet Take 5 mg by mouth daily.  . furosemide (LASIX) 20 MG tablet Take 20 mg by mouth daily.  Marland Kitchen glimepiride (AMARYL) 1 MG tablet Take 1 mg by mouth as needed.  . metFORMIN (GLUCOPHAGE) 1000 MG tablet Take 1 tablet (1,000 mg total) by mouth 2 (two) times daily with a meal.  . metoprolol succinate (TOPROL-XL) 50 MG 24 hr tablet Take 1 tablet (50 mg total) by mouth daily. Take with or immediately following a meal.  . rosuvastatin (CRESTOR) 10 MG tablet Take 10 mg by mouth daily.  . tamsulosin (FLOMAX) 0.4 MG CAPS capsule Take 0.4 mg by mouth daily.   Marland Kitchen tiotropium (SPIRIVA) 18 MCG inhalation capsule Place 1 puff into inhaler and inhale daily.   No facility-administered encounter medications on file as of 05/07/2015.    Orders for this visit: No orders of the defined types were placed in this  encounter.    Thank  you for the visitation and for allowing  [facility] to assist in the care of your patient. Our recommendations are noted above.  Please contact us if we can be of further service.  Vilinda Boehringer, MD [facility] Office Number: [phone]

## 2015-05-07 NOTE — Telephone Encounter (Signed)
Patient calling the office for samples of medication:   1.  What medication and dosage are you requesting samples for?  Eliquis 5 mg   2.  Are you currently out of this medication?   4-5 pills left takes 2 a day  3. Are you requesting samples to get you through until a mail order prescription arrives?  No    Patient is here to see Mungal and would like to pick up at check out if possible.   Instructed patient to check after appt to see if ready.

## 2015-05-07 NOTE — Telephone Encounter (Signed)
Refill sent for eliquis  

## 2015-05-07 NOTE — Assessment & Plan Note (Signed)
Currently COPD is well controlled  Given optimal treatment with Symbicort and Spiriva for a number of years by her primary care physician. Patient is currently asymptomatic, only major complaint is some mild dyspnea on severe exertion. CAT 2,  mMRC 0 His COPD is stable and well controlled. Review of his last CT in 10/2014 does not show significant structural lung disease. Pulmonary function testing 05/07/2015: FEV1 60%, FEV1/FVC 57%. Moderate obstruction and a second dilator response, moderate decrease in DLCO.  Plan: - follow up in 6 months - stable mild COPD - Continue abstaining from tobacco - Continue with Spiriva and Symbicort

## 2015-05-07 NOTE — Patient Instructions (Signed)
Follow up with Dr. Stevenson Clinch in 6 months - continue with your inhalers.

## 2015-05-07 NOTE — Progress Notes (Signed)
PFT performed today. 

## 2015-07-03 ENCOUNTER — Other Ambulatory Visit: Payer: Self-pay | Admitting: Cardiovascular Disease

## 2015-07-03 NOTE — Telephone Encounter (Signed)
Metoprolol 50 mg sent to local pharmacy for 30 day refill.

## 2015-07-03 NOTE — Telephone Encounter (Signed)
°  1. Which medications need to be refilled? Metoprolol 50 mg po daily   2. Which pharmacy is medication to be sent to? [facility]   3. Do they need a 30 day or 90 day supply? 30  4. Would they like a call back once the medication has been sent to the pharmacy? Call if have issues with refill

## 2015-07-15 ENCOUNTER — Other Ambulatory Visit: Payer: Self-pay | Admitting: Cardiovascular Disease

## 2015-08-14 ENCOUNTER — Ambulatory Visit: Payer: PPO | Admitting: Cardiovascular Disease

## 2015-09-01 ENCOUNTER — Telehealth: Payer: Self-pay | Admitting: Cardiovascular Disease

## 2015-09-01 ENCOUNTER — Encounter: Payer: Self-pay | Admitting: Cardiovascular Disease

## 2015-09-01 ENCOUNTER — Ambulatory Visit (INDEPENDENT_AMBULATORY_CARE_PROVIDER_SITE_OTHER): Payer: PPO | Admitting: Cardiovascular Disease

## 2015-09-01 VITALS — BP 140/72 | HR 64 | Ht 68.0 in | Wt 212.5 lb

## 2015-09-01 DIAGNOSIS — I483 Typical atrial flutter: Secondary | ICD-10-CM

## 2015-09-01 DIAGNOSIS — E1165 Type 2 diabetes mellitus with hyperglycemia: Secondary | ICD-10-CM

## 2015-09-01 DIAGNOSIS — I25119 Atherosclerotic heart disease of native coronary artery with unspecified angina pectoris: Secondary | ICD-10-CM

## 2015-09-01 DIAGNOSIS — I10 Essential (primary) hypertension: Secondary | ICD-10-CM

## 2015-09-01 DIAGNOSIS — E662 Morbid (severe) obesity with alveolar hypoventilation: Secondary | ICD-10-CM | POA: Insufficient documentation

## 2015-09-01 DIAGNOSIS — J432 Centrilobular emphysema: Secondary | ICD-10-CM | POA: Diagnosis not present

## 2015-09-01 DIAGNOSIS — I2510 Atherosclerotic heart disease of native coronary artery without angina pectoris: Secondary | ICD-10-CM

## 2015-09-01 DIAGNOSIS — IMO0001 Reserved for inherently not codable concepts without codable children: Secondary | ICD-10-CM

## 2015-09-01 DIAGNOSIS — IMO0002 Reserved for concepts with insufficient information to code with codable children: Secondary | ICD-10-CM | POA: Insufficient documentation

## 2015-09-01 MED ORDER — ROSUVASTATIN CALCIUM 20 MG PO TABS: 20.0000 mg | ORAL_TABLET | Freq: Every day | ORAL | Status: DC

## 2015-09-01 NOTE — Telephone Encounter (Signed)
I have reviewed his most recent lipid panel Also reviewed prior CT scan showing three-vessel CAD including left main disease  Would recommend he increase his Crestor up to 40 mg daily  Goal LDL less than 70 Need to work on his diabetes

## 2015-09-01 NOTE — Assessment & Plan Note (Signed)
Blood pressure is well controlled on today's visit. No changes made to the medications. 

## 2015-09-01 NOTE — Progress Notes (Signed)
[name], male    DOB: 1946/04/14, 69 y.o.   MRN: [id number]  HPI Comments: [name] is a 69 year old gentleman, patient of Dr. Rebecka Apley, with history of hypertension, diabetes, COPD, polycythemia who was sent from oncology to the hospital for tachycardia. Cardiology evaluated the patient 10/29/2014. EKG showed atrial flutter with heart rate 140s up to 150. He has a long smoking history for 45 years. He also reported having 2-3 weeks of cough with yellow phlegm.  In the hospital he was started on beta blockers, diltiazem, digoxin, eventually started on amiodarone infusion with anticoagulation, eliquis. Treatment was started for suspected pneumonia Initially started on levofloxacin but had an allergic reaction with diffuse rash. Change to azithromycin and Rocephin. He had TEE and cardioversion on 10/30/2014. Normal sinus rhythm was successfully restored and he was discharged in normal sinus rhythm Follow-up in the office on his last visit showed he had recurrent atrial flutter.  In follow-up today, he reports that he feels well. Denies any symptoms of shortness of breath on exertion. Trace lower extremity edema, wears a compression sock  Lab work reviewed with him showing total cholesterol 167, LDL 100, hemoglobin A1c 7.7  EKG on today's visit shows atrial flutter with ventricular rate 64 bpm Other past medical history Previous cardioversion on 01/07/2015 which successfully restored normal sinus rhythm In follow-up was found to be in atrial flutter in clinic  TEE report showed ejection fraction 45-50%, right and left atrium mildly enlarged, RV mildly dilated with mildly reduced systolic function, small amount of plaque in the aorta CT scan of the chest 10/29/2014 in the hospital showed bilateral predominantly lower lobe pulmonary opacity most consistent with pneumonia      Allergies  Allergen Reactions  . Levaquin [Levofloxacin In D5w] Rash    Outpatient  Encounter Prescriptions as of 09/01/2015  Medication Sig  . amiodarone (PACERONE) 200 MG tablet Take 1 tablet (200 mg total) by mouth 2 (two) times daily.  Marland Kitchen apixaban (ELIQUIS) 5 MG TABS tablet Take 1 tablet (5 mg total) by mouth 2 (two) times daily.  . budesonide-formoterol (SYMBICORT) 80-4.5 MCG/ACT inhaler Inhale 1 puff into the lungs 2 times daily at 12 noon and 4 pm.  . Cyanocobalamin 1000 MCG TBCR Take 1,000 mcg by mouth daily.  . digoxin (LANOXIN) 0.25 MG tablet Take 1 tablet (0.25 mg total) by mouth daily.  Marland Kitchen diltiazem (CARDIZEM CD) 180 MG 24 hr capsule Take 1 capsule (180 mg total) by mouth 2 (two) times daily.  . finasteride (PROSCAR) 5 MG tablet Take 5 mg by mouth daily.  . furosemide (LASIX) 20 MG tablet Take 20 mg by mouth daily.  Marland Kitchen glimepiride (AMARYL) 1 MG tablet Take 1 mg by mouth as needed.  . metFORMIN (GLUCOPHAGE) 1000 MG tablet Take 1 tablet (1,000 mg total) by mouth 2 (two) times daily with a meal.  . metoprolol succinate (TOPROL-XL) 50 MG 24 hr tablet TAKE ONE TABLET BY MOUTH ONCE DAILY WITH OR IMMEDIATELY FOLLOWING A MEAL  . rosuvastatin (CRESTOR) 20 MG tablet Take 1 tablet (20 mg total) by mouth daily.  . tamsulosin (FLOMAX) 0.4 MG CAPS capsule Take 0.4 mg by mouth daily.   Marland Kitchen tiotropium (SPIRIVA) 18 MCG inhalation capsule Place 1 puff into inhaler and inhale daily.  . [DISCONTINUED] rosuvastatin (CRESTOR) 10 MG tablet Take 10 mg by mouth daily.  . [DISCONTINUED] digoxin (LANOXIN) 0.25 MG tablet TAKE ONE TABLET BY MOUTH ONCE DAILY (Patient not taking: Reported on 09/01/2015)   No  facility-administered encounter medications on file as of 09/01/2015.    Past Medical History  Diagnosis Date  . Hyperlipidemia   . Hypertension   . Diabetes mellitus without complication ([place])   . Tachycardia   . Polycythemia   . A-fib ([place])   . Atrial flutter ([place])   . COPD (chronic obstructive pulmonary disease) [facility])     Past Surgical History  Procedure Laterality Date  . Tee  with cardioversion  12/16    Social History  reports that he quit smoking about 2 years ago. His smoking use included Cigarettes. He has a 67.5 pack-year smoking history. He does not have any smokeless tobacco history on file. He reports that he drinks about 7.2 oz of alcohol per week. He reports that he does not use illicit drugs.  Family History Family history is unknown by patient.  Review of Systems  Constitutional: Negative.   Respiratory: Negative.   Cardiovascular: Positive for leg swelling.  Gastrointestinal: Negative.   Musculoskeletal: Negative.   Skin: Negative.   Neurological: Negative.   Hematological: Negative.   Psychiatric/Behavioral: Negative.   All other systems reviewed and are negative.   BP 140/72 mmHg  Pulse 64  Ht 5\' 8"  (1.727 m)  Wt 212 lb 8 oz (96.389 kg)  BMI 32.32 kg/m2  Physical Exam  Constitutional: He is oriented to person, place, and time. He appears well-developed and well-nourished.  HENT:  Head: Normocephalic.  Nose: Nose normal.  Mouth/Throat: Oropharynx is clear and moist.  Eyes: Conjunctivae are normal. Pupils are equal, round, and reactive to light.  Neck: Normal range of motion. Neck supple. No JVD present.  Cardiovascular: Normal rate, regular rhythm, S1 normal, S2 normal, normal heart sounds and intact distal pulses.  Exam reveals no gallop and no friction rub.   No murmur heard. Trace edema around his ankles bilaterally  Pulmonary/Chest: Effort normal. No respiratory distress. He has no rales. He exhibits no tenderness.  Abdominal: Soft. Bowel sounds are normal. He exhibits no distension. There is no tenderness.  Musculoskeletal: Normal range of motion. He exhibits no edema or tenderness.  Lymphadenopathy:    He has no cervical adenopathy.  Neurological: He is alert and oriented to person, place, and time. Coordination normal.  Skin: Skin is warm and dry. No rash noted. No erythema.  Psychiatric: He has a normal mood and affect.  His behavior is normal. Judgment and thought content normal.      Assessment and Plan   Nursing note and vitals reviewed.

## 2015-09-01 NOTE — Assessment & Plan Note (Signed)
Lung disease followed by pulmonary. Reports breathing is stable

## 2015-09-01 NOTE — Assessment & Plan Note (Signed)
Seen on CT scan as above, three-vessel

## 2015-09-01 NOTE — Assessment & Plan Note (Signed)
Asymptomatic atrial flutter. Tolerating anticoagulation. Previously discussed with him, he preferred medical management rather than aggressive treatment such as repeat cardioversion or ablation

## 2015-09-01 NOTE — Assessment & Plan Note (Signed)
Prior CT scan reviewed showing three-vessel CAD including left main disease. Stressed importance of improved diabetes control If LDL continues to run above goal, will need to increase up to 20 mg daily

## 2015-09-01 NOTE — Assessment & Plan Note (Signed)
We have encouraged continued exercise, careful diet management in an effort to lose weight. Hemoglobin A1c 7.7

## 2015-09-01 NOTE — Patient Instructions (Signed)
You are doing well.  Please increase the crestor up to 20 mg daily  Please call us if you have new issues that need to be addressed before your next appt.  Your physician wants you to follow-up in: 6 months.  You will receive a reminder letter in the mail two months in advance. If you don't receive a letter, please call our office to schedule the follow-up appointment.

## 2015-09-02 MED ORDER — ROSUVASTATIN CALCIUM 40 MG PO TABS: 40.0000 mg | ORAL_TABLET | Freq: Every day | ORAL | Status: DC

## 2015-09-02 NOTE — Telephone Encounter (Signed)
Spoke w/ pt.  Advised him of Dr. Donivan Scull recommendation.  He verbalizes understanding and is agreeable.

## 2015-09-02 NOTE — Telephone Encounter (Signed)
Left message for pt to call back  °

## 2015-09-02 NOTE — Addendum Note (Signed)
Addended by: Dede Query R on: 09/02/2015 09:08 AM   Modules accepted: Orders

## 2015-09-15 ENCOUNTER — Telehealth: Payer: Self-pay | Admitting: *Deleted

## 2015-09-15 NOTE — Telephone Encounter (Signed)
Pharmacy calling, they have questions on  Need to know which dose on Crestor to give pt. We sent in a 20 and a 40 Please advise

## 2015-09-15 NOTE — Telephone Encounter (Signed)
Called pharmacy to confirm medication dosage. 40 mg Crestor

## 2015-10-08 ENCOUNTER — Telehealth: Payer: Self-pay | Admitting: Cardiovascular Disease

## 2015-10-08 NOTE — Telephone Encounter (Signed)
Patient calling the office for samples of medication:   1.  What medication and dosage are you requesting samples for?  Eliquis 5 mg po 2x daily   2.  Are you currently out of this medication?  No but only 5 pills left (2 more days)

## 2015-10-08 NOTE — Telephone Encounter (Signed)
Called pt and left message informing him that we do not have any samples of the Eliquis 5 mg tablets at this time and he could call back later this afternoon or one day next week to see if we have gotten any samples in and if he has any other problems, questions or concerns to call the office.

## 2015-10-16 HISTORY — PX: TEE WITH CARDIOVERSION: SHX5442

## 2015-10-30 ENCOUNTER — Other Ambulatory Visit: Payer: Self-pay | Admitting: Cardiovascular Disease

## 2015-11-06 ENCOUNTER — Telehealth: Payer: Self-pay

## 2015-11-06 NOTE — Telephone Encounter (Signed)
Eliquis 5 mg samples at front desk for pick up.

## 2015-11-06 NOTE — Telephone Encounter (Signed)
Pt would like Eliquis samples.  

## 2015-11-18 ENCOUNTER — Other Ambulatory Visit: Payer: Self-pay | Admitting: Cardiovascular Disease

## 2015-11-20 DIAGNOSIS — E781 Pure hyperglyceridemia: Secondary | ICD-10-CM | POA: Diagnosis not present

## 2015-11-20 DIAGNOSIS — E1165 Type 2 diabetes mellitus with hyperglycemia: Secondary | ICD-10-CM | POA: Diagnosis not present

## 2015-11-27 DIAGNOSIS — E1129 Type 2 diabetes mellitus with other diabetic kidney complication: Secondary | ICD-10-CM | POA: Diagnosis not present

## 2015-11-27 DIAGNOSIS — R809 Proteinuria, unspecified: Secondary | ICD-10-CM | POA: Diagnosis not present

## 2015-11-27 DIAGNOSIS — E785 Hyperlipidemia, unspecified: Secondary | ICD-10-CM | POA: Diagnosis not present

## 2015-11-27 DIAGNOSIS — E279 Disorder of adrenal gland, unspecified: Secondary | ICD-10-CM | POA: Diagnosis not present

## 2015-11-27 DIAGNOSIS — E1165 Type 2 diabetes mellitus with hyperglycemia: Secondary | ICD-10-CM | POA: Diagnosis not present

## 2015-11-27 DIAGNOSIS — E538 Deficiency of other specified B group vitamins: Secondary | ICD-10-CM | POA: Diagnosis not present

## 2015-11-27 DIAGNOSIS — E781 Pure hyperglyceridemia: Secondary | ICD-10-CM | POA: Diagnosis not present

## 2015-12-12 DIAGNOSIS — J449 Chronic obstructive pulmonary disease, unspecified: Secondary | ICD-10-CM | POA: Diagnosis not present

## 2015-12-15 ENCOUNTER — Encounter: Payer: Self-pay | Admitting: Internal Medicine

## 2015-12-15 ENCOUNTER — Ambulatory Visit (INDEPENDENT_AMBULATORY_CARE_PROVIDER_SITE_OTHER): Payer: PPO | Admitting: Internal Medicine

## 2015-12-15 VITALS — BP 134/62 | HR 87 | Ht 68.0 in | Wt 205.0 lb

## 2015-12-15 DIAGNOSIS — J432 Centrilobular emphysema: Secondary | ICD-10-CM

## 2015-12-15 MED ORDER — ALBUTEROL SULFATE HFA 108 (90 BASE) MCG/ACT IN AERS: 2.0000 | INHALATION_SPRAY | RESPIRATORY_TRACT | Status: DC | PRN

## 2015-12-15 NOTE — Progress Notes (Signed)
MRN# [id number] [name] 1945/12/09   CC: Chief Complaint  Patient presents with  . Follow-up    pt. states breathing has improved. dry cough sometimes prod. white in color X3d. denies SOB, wheezing or chest pain/tightness.       Brief History: HPI 01/03/15 -70 year old male with a past medical history significant for atrial flutter, COPD, hypertension referred by Dr.Golan for COPD optimization and preop pulmonary evaluation prior to scheduled cardioversion for atrial flutter. Patient had a recent admission to the [facility] in December 2050 for 8 days, he was noted to be tachycardic at his oncology office for which he is following fall polycythemia vera do not citation he was noted to be in atrial flutter, also noted to have a pneumonia (chest x-ray showed patchy airspace disease); at that time he was treated with antibiotics and steroids. Patient states that he's never been hospitalized for breathing issues are her to be intubated for any type of respiratory distress. He states that he gets a yearly cold but does not need admission for antibiotics every year. His primary care physician has been on Symbicort and Spiriva for which he uses on a regular basis and rinses afterwards. He does not endorse a morning cough, or sputum production. He quit smoking 2 years ago. He is currently following with cardiology and will have a scheduled cardioversion for atrial flutter early next week. Plan - COPD well controlled on Symbicort and Spiriva, follow up in 1 month after cardioversion  ROV 02/04/15: Patient presents today for a follow up visit of his COPD. Denies cough, worsening sob at rest or with exertion, no wheezing. He has not smoked since 2014. Currently, doing well respiratory wise - using inhalers as directed with good compliance.  Had cardioversion in February for atrial fibrillation. Plan - cont with spiriva and symbicort, full pfts  ROV 05/07/15: Patient presents  today for follow-up visit, overall he states that he is doing well. Since his last visit he has not had any further ED or urgent care visits for shortness of breath, upper spray tract infection, other breathing issues. Patient states that overall he is doing well, if he overexerts himself he does have some mild shortness of breath but he is not having any tobacco products of any kind. Today he did have his primary function testing done.  Plan: - follow up in 6 months - stable mild COPD - Continue abstaining from tobacco - Continue with Spiriva and Symbicort  Events since last clinic visit: Patient presents today for follow-up visit of his COPD. Overall he states he is doing well. He did state on Saturday he started having a mild productive cough, he has been in contact with sick individuals. However, he denies any fever, chills, worsening shortness of breath. Today we reviewed his inhalers and he demonstrated proper education and use of them. However, after reviewing his medications as noted he does not have a rescue inhaler.  Medication:   Current Outpatient Rx  Name  Route  Sig  Dispense  Refill  . amiodarone (PACERONE) 200 MG tablet   Oral   Take 1 tablet (200 mg total) by mouth 2 (two) times daily.   60 tablet   3   . budesonide-formoterol (SYMBICORT) 80-4.5 MCG/ACT inhaler   Inhalation   Inhale 1 puff into the lungs 2 times daily at 12 noon and 4 pm.         . Cyanocobalamin 1000 MCG TBCR   Oral   Take 1,000  mcg by mouth daily.         . digoxin (LANOXIN) 0.25 MG tablet   Oral   Take 1 tablet (0.25 mg total) by mouth daily.   30 tablet   3   . digoxin (LANOXIN) 0.25 MG tablet      TAKE ONE TABLET BY MOUTH ONCE DAILY   30 tablet   3   . diltiazem (CARDIZEM CD) 180 MG 24 hr capsule   Oral   Take 1 capsule (180 mg total) by mouth 2 (two) times daily.   60 capsule   3   . ELIQUIS 5 MG TABS tablet      TAKE ONE TABLET BY MOUTH TWICE DAILY   60 tablet   3   .  glimepiride (AMARYL) 1 MG tablet   Oral   Take 1 mg by mouth as needed.         Marland Kitchen glucose blood (FREESTYLE LITE) test strip      Check blood sugar 2x a day as directed         . metFORMIN (GLUCOPHAGE) 1000 MG tablet   Oral   Take 1 tablet (1,000 mg total) by mouth 2 (two) times daily with a meal.   180 tablet   0   . metoprolol succinate (TOPROL-XL) 50 MG 24 hr tablet      TAKE ONE TABLET BY MOUTH ONCE DAILY WITH OR IMMEDIATELY FOLLOWING A MEAL   30 tablet   3   . rosuvastatin (CRESTOR) 40 MG tablet   Oral   Take 1 tablet (40 mg total) by mouth daily.   30 tablet   6   . tiotropium (SPIRIVA) 18 MCG inhalation capsule   Inhalation   Place 1 puff into inhaler and inhale daily.         Marland Kitchen albuterol (PROVENTIL HFA;VENTOLIN HFA) 108 (90 Base) MCG/ACT inhaler   Inhalation   Inhale 2 puffs into the lungs every 4 (four) hours as needed for wheezing or shortness of breath.   1 Inhaler   6      Review of Systems: Gen:  Denies  fever, sweats, chills HEENT: Denies blurred vision, double vision, ear pain, eye pain, hearing loss, nose bleeds, sore throat Cvc:  No dizziness, chest pain or heaviness Resp:   Admits to: Mild shortness of breath with exertion, this is chronic, mild productive whitish sputum over the past 3 days Gi: Denies swallowing difficulty, stomach pain, nausea or vomiting, diarrhea, constipation, bowel incontinence Gu:  Denies bladder incontinence, burning urine Ext:   No Joint pain, stiffness or swelling Skin: No skin rash, easy bruising or bleeding or hives Endoc:  No polyuria, polydipsia , polyphagia or weight change Other:  All other systems negative  Allergies:  Levaquin  Physical Examination:  VS: BP 134/62 mmHg  Pulse 87  Ht 5\' 8"  (1.727 m)  Wt 205 lb (92.987 kg)  BMI 31.18 kg/m2  SpO2 94%  General Appearance: No distress  HEENT: PERRLA, no ptosis, no other lesions noticed Pulmonary:normal breath sounds., diaphragmatic excursion normal.No  wheezing, No rales   Cardiovascular:  Normal S1,S2.  No m/r/g.     Abdomen:Exam: Benign, Soft, non-tender, No masses  Skin:   warm, no rashes, no ecchymosis  Extremities: normal, no cyanosis, clubbing, warm with normal capillary refill.     Pulmonary function testing 05/07/2015 FEV1 62% FEV1/FVC 57% RV 136 TLC 93 RV/TLC 144 DLCO uncorrected 67% Impression: Moderate obstruction with no cervical broncho-dilator response. Moderate decrease in DLCO.  Assessment and Plan: 70 year old male past medical history of tobacco abuse, seen in consultation for COPD follow-up. COPD type A Currently COPD is well controlled  Given optimal treatment with Symbicort and Spiriva for a number of years by her primary care physician. Patient is currently asymptomatic, only major complaint is some mild dyspnea on severe exertion. CAT 2,  mMRC 0 His COPD is stable and well controlled. Review of his last CT in 10/2014 does not show significant structural lung disease. Pulmonary function testing 05/07/2015: FEV1 60%, FEV1/FVC 57%. Moderate obstruction and a second dilator response, moderate decrease in DLCO.  Plan: - follow up in 6 months - stable mild COPD - Continue abstaining from tobacco - Continue with Spiriva and Symbicort - prescription for rescue inhaler - PFTs and 6MWT prior to follow up visit - if unable to perform PFTs, will do office spirometry and 52mwt.          Updated Medication List Outpatient Encounter Prescriptions as of 12/15/2015  Medication Sig  . amiodarone (PACERONE) 200 MG tablet Take 1 tablet (200 mg total) by mouth 2 (two) times daily.  . budesonide-formoterol (SYMBICORT) 80-4.5 MCG/ACT inhaler Inhale 1 puff into the lungs 2 times daily at 12 noon and 4 pm.  . Cyanocobalamin 1000 MCG TBCR Take 1,000 mcg by mouth daily.  . digoxin (LANOXIN) 0.25 MG tablet Take 1 tablet (0.25 mg total) by mouth daily.  . digoxin (LANOXIN) 0.25 MG tablet TAKE ONE TABLET BY MOUTH ONCE  DAILY  . diltiazem (CARDIZEM CD) 180 MG 24 hr capsule Take 1 capsule (180 mg total) by mouth 2 (two) times daily.  Marland Kitchen ELIQUIS 5 MG TABS tablet TAKE ONE TABLET BY MOUTH TWICE DAILY  . glimepiride (AMARYL) 1 MG tablet Take 1 mg by mouth as needed.  Marland Kitchen glucose blood (FREESTYLE LITE) test strip Check blood sugar 2x a day as directed  . metFORMIN (GLUCOPHAGE) 1000 MG tablet Take 1 tablet (1,000 mg total) by mouth 2 (two) times daily with a meal.  . metoprolol succinate (TOPROL-XL) 50 MG 24 hr tablet TAKE ONE TABLET BY MOUTH ONCE DAILY WITH OR IMMEDIATELY FOLLOWING A MEAL  . rosuvastatin (CRESTOR) 40 MG tablet Take 1 tablet (40 mg total) by mouth daily.  Marland Kitchen tiotropium (SPIRIVA) 18 MCG inhalation capsule Place 1 puff into inhaler and inhale daily.  Marland Kitchen albuterol (PROVENTIL HFA;VENTOLIN HFA) 108 (90 Base) MCG/ACT inhaler Inhale 2 puffs into the lungs every 4 (four) hours as needed for wheezing or shortness of breath.  . [DISCONTINUED] finasteride (PROSCAR) 5 MG tablet Take 5 mg by mouth daily. Reported on 12/15/2015  . [DISCONTINUED] furosemide (LASIX) 20 MG tablet Take 20 mg by mouth daily. Reported on 12/15/2015  . [DISCONTINUED] tamsulosin (FLOMAX) 0.4 MG CAPS capsule Take 0.4 mg by mouth daily. Reported on 12/15/2015   No facility-administered encounter medications on file as of 12/15/2015.    Orders for this visit: Orders Placed This Encounter  Procedures  . Pulmonary function test    Standing Status: Future     Number of Occurrences:      Standing Expiration Date: 12/14/2016    Order Specific Question:  Where should this test be performed?    Answer:  [facility]    Order Specific Question:  MIP/MEP    Answer:  No    Order Specific Question:  6 minute walk    Answer:  Yes    Order Specific Question:  ABG    Answer:  No    Order Specific  Question:  Diffusion capacity (DLCO)    Answer:  Yes    Order Specific Question:  Lung volumes    Answer:  Yes    Order Specific Question:  Methacholine  challenge    Answer:  No  . 6 minute walk    Standing Status: Future     Number of Occurrences:      Standing Expiration Date: 12/14/2016    Order Specific Question:  Where should this test be performed?    Answer:  Other    Thank  you for the visitation and for allowing  [facility] to assist in the care of your patient. Our recommendations are noted above.  Please contact us if we can be of further service.  Vilinda Boehringer, MD [facility] Office Number: [phone]

## 2015-12-15 NOTE — Patient Instructions (Signed)
Follow up with Dr. Stevenson Clinch in: 6 months - PFTs and 6MWT prior to next visit - we will give you a prescription for an albuterol rescue inhaler - albuterol inhaler - 2puff every 3-4 hours as needed for shortness of breath\wheezing\recurrent cough.  If you have to use more than 3-4 times in one day then call our office.  - cont with your current inhalers (Spiriva and Symbicort).

## 2015-12-15 NOTE — Assessment & Plan Note (Signed)
Currently COPD is well controlled  Given optimal treatment with Symbicort and Spiriva for a number of years by her primary care physician. Patient is currently asymptomatic, only major complaint is some mild dyspnea on severe exertion. CAT 2,  mMRC 0 His COPD is stable and well controlled. Review of his last CT in 10/2014 does not show significant structural lung disease. Pulmonary function testing 05/07/2015: FEV1 60%, FEV1/FVC 57%. Moderate obstruction and a second dilator response, moderate decrease in DLCO.  Plan: - follow up in 6 months - stable mild COPD - Continue abstaining from tobacco - Continue with Spiriva and Symbicort - prescription for rescue inhaler - PFTs and 6MWT prior to follow up visit - if unable to perform PFTs, will do office spirometry and 7mwt.

## 2015-12-18 ENCOUNTER — Other Ambulatory Visit: Payer: Self-pay

## 2015-12-18 ENCOUNTER — Telehealth: Payer: Self-pay

## 2015-12-18 NOTE — Telephone Encounter (Signed)
Error

## 2015-12-18 NOTE — Telephone Encounter (Signed)
Pharmacist called to ok Digoxin to generic, generic brand has been changed. Please call.

## 2015-12-18 NOTE — Telephone Encounter (Signed)
Spoke w/ pharmacist, advised her that generic is fine.

## 2016-01-12 DIAGNOSIS — J449 Chronic obstructive pulmonary disease, unspecified: Secondary | ICD-10-CM | POA: Diagnosis not present

## 2016-01-19 DIAGNOSIS — E119 Type 2 diabetes mellitus without complications: Secondary | ICD-10-CM | POA: Diagnosis not present

## 2016-01-19 DIAGNOSIS — E1129 Type 2 diabetes mellitus with other diabetic kidney complication: Secondary | ICD-10-CM | POA: Diagnosis not present

## 2016-01-19 DIAGNOSIS — E1165 Type 2 diabetes mellitus with hyperglycemia: Secondary | ICD-10-CM | POA: Diagnosis not present

## 2016-01-19 DIAGNOSIS — J439 Emphysema, unspecified: Secondary | ICD-10-CM | POA: Diagnosis not present

## 2016-02-09 DIAGNOSIS — J449 Chronic obstructive pulmonary disease, unspecified: Secondary | ICD-10-CM | POA: Diagnosis not present

## 2016-02-19 DIAGNOSIS — E1129 Type 2 diabetes mellitus with other diabetic kidney complication: Secondary | ICD-10-CM | POA: Diagnosis not present

## 2016-02-19 DIAGNOSIS — J209 Acute bronchitis, unspecified: Secondary | ICD-10-CM | POA: Diagnosis not present

## 2016-02-19 DIAGNOSIS — J439 Emphysema, unspecified: Secondary | ICD-10-CM | POA: Diagnosis not present

## 2016-02-19 DIAGNOSIS — E1165 Type 2 diabetes mellitus with hyperglycemia: Secondary | ICD-10-CM | POA: Diagnosis not present

## 2016-02-19 DIAGNOSIS — E781 Pure hyperglyceridemia: Secondary | ICD-10-CM | POA: Diagnosis not present

## 2016-02-26 DIAGNOSIS — E1165 Type 2 diabetes mellitus with hyperglycemia: Secondary | ICD-10-CM | POA: Diagnosis not present

## 2016-02-26 DIAGNOSIS — E538 Deficiency of other specified B group vitamins: Secondary | ICD-10-CM | POA: Diagnosis not present

## 2016-02-26 DIAGNOSIS — E1129 Type 2 diabetes mellitus with other diabetic kidney complication: Secondary | ICD-10-CM | POA: Diagnosis not present

## 2016-02-26 DIAGNOSIS — E279 Disorder of adrenal gland, unspecified: Secondary | ICD-10-CM | POA: Diagnosis not present

## 2016-02-26 DIAGNOSIS — E785 Hyperlipidemia, unspecified: Secondary | ICD-10-CM | POA: Diagnosis not present

## 2016-02-26 DIAGNOSIS — R809 Proteinuria, unspecified: Secondary | ICD-10-CM | POA: Diagnosis not present

## 2016-02-26 DIAGNOSIS — E781 Pure hyperglyceridemia: Secondary | ICD-10-CM | POA: Diagnosis not present

## 2016-03-03 ENCOUNTER — Ambulatory Visit (INDEPENDENT_AMBULATORY_CARE_PROVIDER_SITE_OTHER): Payer: PPO | Admitting: Cardiovascular Disease

## 2016-03-03 ENCOUNTER — Encounter: Payer: Self-pay | Admitting: Cardiovascular Disease

## 2016-03-03 VITALS — BP 120/64 | HR 75 | Ht 66.0 in | Wt 195.5 lb

## 2016-03-03 DIAGNOSIS — E1165 Type 2 diabetes mellitus with hyperglycemia: Secondary | ICD-10-CM

## 2016-03-03 DIAGNOSIS — E1159 Type 2 diabetes mellitus with other circulatory complications: Secondary | ICD-10-CM

## 2016-03-03 DIAGNOSIS — IMO0002 Reserved for concepts with insufficient information to code with codable children: Secondary | ICD-10-CM

## 2016-03-03 DIAGNOSIS — I10 Essential (primary) hypertension: Secondary | ICD-10-CM | POA: Diagnosis not present

## 2016-03-03 DIAGNOSIS — I483 Typical atrial flutter: Secondary | ICD-10-CM

## 2016-03-03 DIAGNOSIS — I25119 Atherosclerotic heart disease of native coronary artery with unspecified angina pectoris: Secondary | ICD-10-CM

## 2016-03-03 DIAGNOSIS — J432 Centrilobular emphysema: Secondary | ICD-10-CM

## 2016-03-03 DIAGNOSIS — I2510 Atherosclerotic heart disease of native coronary artery without angina pectoris: Secondary | ICD-10-CM | POA: Diagnosis not present

## 2016-03-03 MED ORDER — ROSUVASTATIN CALCIUM 40 MG PO TABS: 40.0000 mg | ORAL_TABLET | Freq: Every day | ORAL | Status: DC

## 2016-03-03 NOTE — Assessment & Plan Note (Signed)
Blood pressure is well controlled on today's visit. No changes made to the medications. 

## 2016-03-03 NOTE — Assessment & Plan Note (Signed)
Currently with no symptoms of angina. No further workup at this time. Continue current medication regimen. 

## 2016-03-03 NOTE — Assessment & Plan Note (Signed)
Some confusion concerning his diabetes medications. He did not bring the mall with him today We have encouraged continued exercise, careful diet management in an effort to lose weight.    Total encounter time more than 25 minutes  Greater than 50% was spent in counseling and coordination of care with the patient

## 2016-03-03 NOTE — Patient Instructions (Signed)
You are doing well. No medication changes were made.  We will arrange an appt with Dr. Caryl Comes to discuss atrial flutter ablation  Please call us if you have new issues that need to be addressed before your next appt.  Your physician wants you to follow-up in: 6 months.  You will receive a reminder letter in the mail two months in advance. If you don't receive a letter, please call our office to schedule the follow-up appointment.

## 2016-03-03 NOTE — Progress Notes (Signed)
[name], male    DOB: 07/28/1946, 70 y.o.   MRN: [id number]  HPI Comments: [name] is a 70 year old gentleman, patient of Dr. Rebecka Apley, with history of hypertension, diabetes, COPD, polycythemia who was sent from oncology to the hospital for tachycardia. Cardiology evaluated the patient 10/29/2014. EKG showed atrial flutter with heart rate 140s up to 150. He has a long smoking history for 45 years. He also reported having 2-3 weeks of cough with yellow phlegm.  In the hospital he was started on beta blockers, diltiazem, digoxin, eventually started on amiodarone infusion with anticoagulation, eliquis. Treatment was started for suspected pneumonia Initially started on levofloxacin but had an allergic reaction with diffuse rash. Change to azithromycin and Rocephin. He had TEE and cardioversion on 10/30/2014. Normal sinus rhythm was successfully restored and he was discharged in normal sinus rhythm Follow-up in the office on his last visit showed he had recurrent atrial flutter.  In follow-up today, he has upper respiratory infection  overallFeels fine, relatively sedentary, no regular exercise program Reports he is taking metformin, unclear if he is taking Amaryl. Managed by Dr. Eddie Dibbles Also on his inhalers  EKG on today's visit shows atrial flutter with ventricular rate 75 bpm  Other past medical history reviewed  total cholesterol 167, LDL 100, hemoglobin A1c 7.7  Previous cardioversion on 01/07/2015 which successfully restored normal sinus rhythm In follow-up was found to be in atrial flutter in clinic  TEE report showed ejection fraction 45-50%, right and left atrium mildly enlarged, RV mildly dilated with mildly reduced systolic function, small amount of plaque in the aorta CT scan of the chest 10/29/2014 in the hospital showed bilateral predominantly lower lobe pulmonary opacity most consistent with pneumonia      Allergies  Allergen Reactions  . Levaquin  [Levofloxacin In D5w] Rash    Outpatient Encounter Prescriptions as of 03/03/2016  Medication Sig  . albuterol (PROVENTIL HFA;VENTOLIN HFA) 108 (90 Base) MCG/ACT inhaler Inhale 2 puffs into the lungs every 4 (four) hours as needed for wheezing or shortness of breath.  . budesonide-formoterol (SYMBICORT) 80-4.5 MCG/ACT inhaler Inhale 1 puff into the lungs 2 times daily at 12 noon and 4 pm.  . Cyanocobalamin 1000 MCG TBCR Take 1,000 mcg by mouth daily.  . digoxin (LANOXIN) 0.25 MG tablet TAKE ONE TABLET BY MOUTH ONCE DAILY  . diltiazem (CARDIZEM CD) 180 MG 24 hr capsule Take 1 capsule (180 mg total) by mouth 2 (two) times daily.  Marland Kitchen ELIQUIS 5 MG TABS tablet TAKE ONE TABLET BY MOUTH TWICE DAILY  . glucose blood (FREESTYLE LITE) test strip Check blood sugar 2x a day as directed  . metFORMIN (GLUCOPHAGE) 1000 MG tablet Take 1 tablet (1,000 mg total) by mouth 2 (two) times daily with a meal.  . metoprolol succinate (TOPROL-XL) 50 MG 24 hr tablet TAKE ONE TABLET BY MOUTH ONCE DAILY WITH OR IMMEDIATELY FOLLOWING A MEAL  . rosuvastatin (CRESTOR) 40 MG tablet Take 1 tablet (40 mg total) by mouth daily.  Marland Kitchen tiotropium (SPIRIVA) 18 MCG inhalation capsule Place 1 puff into inhaler and inhale daily.  . [DISCONTINUED] amiodarone (PACERONE) 200 MG tablet Take 1 tablet (200 mg total) by mouth 2 (two) times daily.  Marland Kitchen glimepiride (AMARYL) 1 MG tablet Take 1 mg by mouth as needed.  . [DISCONTINUED] digoxin (LANOXIN) 0.25 MG tablet Take 1 tablet (0.25 mg total) by mouth daily. (Patient not taking: Reported on 03/03/2016)   No facility-administered encounter medications on file as of 03/03/2016.  Past Medical History  Diagnosis Date  . Hyperlipidemia   . Hypertension   . Diabetes mellitus without complication ([place])   . Tachycardia   . Polycythemia   . A-fib ([place])   . Atrial flutter ([place])   . COPD (chronic obstructive pulmonary disease) [facility])     Past Surgical History  Procedure Laterality Date  . Tee  with cardioversion  12/16    Social History  reports that he quit smoking about 3 years ago. His smoking use included Cigarettes. He has a 67.5 pack-year smoking history. He does not have any smokeless tobacco history on file. He reports that he does not drink alcohol or use illicit drugs.  Family History Family history is unknown by patient.  Review of Systems  Constitutional: Negative.   Respiratory: Negative.   Cardiovascular: Negative.   Gastrointestinal: Negative.   Musculoskeletal: Negative.   Skin: Negative.   Neurological: Negative.   Hematological: Negative.   Psychiatric/Behavioral: Negative.   All other systems reviewed and are negative.   BP 120/64 mmHg  Pulse 75  Ht 5\' 6"  (1.676 m)  Wt 195 lb 8 oz (88.678 kg)  BMI 31.57 kg/m2  Physical Exam  Constitutional: He is oriented to person, place, and time. He appears well-developed and well-nourished.  HENT:  Head: Normocephalic.  Nose: Nose normal.  Mouth/Throat: Oropharynx is clear and moist.  Eyes: Conjunctivae are normal. Pupils are equal, round, and reactive to light.  Neck: Normal range of motion. Neck supple. No JVD present.  Cardiovascular: Normal rate, regular rhythm, S1 normal, S2 normal, normal heart sounds and intact distal pulses.  Exam reveals no gallop and no friction rub.   No murmur heard. Trace edema around his ankles bilaterally  Pulmonary/Chest: Effort normal. No respiratory distress. He has no rales. He exhibits no tenderness.  Abdominal: Soft. Bowel sounds are normal. He exhibits no distension. There is no tenderness.  Musculoskeletal: Normal range of motion. He exhibits no edema or tenderness.  Lymphadenopathy:    He has no cervical adenopathy.  Neurological: He is alert and oriented to person, place, and time. Coordination normal.  Skin: Skin is warm and dry. No rash noted. No erythema.  Psychiatric: He has a normal mood and affect. His behavior is normal. Judgment and thought content  normal.      Assessment and Plan   Nursing note and vitals reviewed.

## 2016-03-03 NOTE — Assessment & Plan Note (Signed)
Long discussion concerning risk and benefit of atrial flutter and long-term anticoagulation. Sedentary at baseline, unclear if atrial flutter is affecting his respiratory status but he is on several inhalers for COPD, shortness of breath. After long discussion, we have recommended he atrial flutter ablation.  He has indicated that he does not want to be on anticoagulation long-term, such as when he gets older He did have successful cardioversion in the past, but had recurrent atrial flutter No medication changes made at this time, ventricular rate well controlled

## 2016-03-03 NOTE — Assessment & Plan Note (Signed)
Stable, managed by pulmonary. On 3 inhalers

## 2016-03-07 ENCOUNTER — Other Ambulatory Visit: Payer: Self-pay | Admitting: Cardiovascular Disease

## 2016-03-11 DIAGNOSIS — J449 Chronic obstructive pulmonary disease, unspecified: Secondary | ICD-10-CM | POA: Diagnosis not present

## 2016-03-16 DIAGNOSIS — E1165 Type 2 diabetes mellitus with hyperglycemia: Secondary | ICD-10-CM | POA: Diagnosis not present

## 2016-03-16 DIAGNOSIS — E781 Pure hyperglyceridemia: Secondary | ICD-10-CM | POA: Diagnosis not present

## 2016-03-16 DIAGNOSIS — E785 Hyperlipidemia, unspecified: Secondary | ICD-10-CM | POA: Diagnosis not present

## 2016-03-21 ENCOUNTER — Other Ambulatory Visit: Payer: Self-pay | Admitting: Cardiovascular Disease

## 2016-03-23 ENCOUNTER — Encounter: Payer: Self-pay | Admitting: Internal Medicine

## 2016-03-23 ENCOUNTER — Ambulatory Visit (INDEPENDENT_AMBULATORY_CARE_PROVIDER_SITE_OTHER): Payer: PPO | Admitting: Internal Medicine

## 2016-03-23 VITALS — BP 134/58 | HR 60 | Ht 66.0 in | Wt 191.4 lb

## 2016-03-23 DIAGNOSIS — I483 Typical atrial flutter: Secondary | ICD-10-CM | POA: Diagnosis not present

## 2016-03-23 NOTE — Progress Notes (Signed)
ELECTROPHYSIOLOGY CONSULT NOTE  [name], MRN: [id number], DOB/AGE: 1946-08-04 70 y.o. Admit date: (Not on file) Date of Consult: 03/23/2016  Primary Physician: [name], MD Primary Cardiologist: TG Consulting Physician TG  Chief Complaint: Atrial flutter   HPI [name] is a 70 y.o. male  Referred for consideration of catheter ablation for atrial flutter  He was initially seen [id number] Cardiology in 2015 when he presented with typical atrial flutter. Multiple ECGs were reviewed and they all represented relatively typical atrial flutter apart from a somewhat biphasic negative/positive upright flutter waves in lead V1;  He subsequently developed an atypical flutter with a positive flutter waves in the inferior leads and a somewhat negative flutter waves in lead V1. These have all been associated with no significant symptoms.  Thromboembolic risk profile is notable for age-65, hypertension-1, diabetes-1 for a CHADS-VASc score of greater than or equal to 3.  He was cardioverted and treated subsequently with amiodarone  He was also found to have C  " three-vessel disease" which was inferred from a CTA  Demonstrating coronary artery calcification      Past Medical History  Diagnosis Date  . Hyperlipidemia   . Hypertension   . Diabetes mellitus without complication ([place])   . Tachycardia   . Polycythemia   . A-fib ([place])   . Atrial flutter ([place])   . COPD (chronic obstructive pulmonary disease) [facility])       Surgical History:  Past Surgical History  Procedure Laterality Date  . Tee with cardioversion  12/16     Home Meds: Prior to Admission medications   Medication Sig Start Date End Date Taking? Authorizing Provider  budesonide-formoterol (SYMBICORT) 80-4.5 MCG/ACT inhaler Inhale 1 puff into the lungs 2 times daily at 12 noon and 4 pm.   Yes Historical Provider, MD  Cyanocobalamin 1000 MCG TBCR Take 1,000 mcg by mouth daily.   Yes  Historical Provider, MD  digoxin (LANOXIN) 0.25 MG tablet TAKE ONE TABLET BY MOUTH ONCE DAILY 03/22/16  Yes [name], MD  diltiazem (CARDIZEM CD) 180 MG 24 hr capsule Take 1 capsule (180 mg total) by mouth 2 (two) times daily. 04/21/15  Yes [name], MD  ELIQUIS 5 MG TABS tablet TAKE ONE TABLET BY MOUTH TWICE DAILY 11/18/15  Yes [name], MD  glucose blood (FREESTYLE LITE) test strip Check blood sugar 2x a day as directed 02/04/15  Yes Historical Provider, MD  metFORMIN (GLUCOPHAGE) 1000 MG tablet Take 1 tablet (1,000 mg total) by mouth 2 (two) times daily with a meal. 11/27/14  Yes [name], MD  metoprolol succinate (TOPROL-XL) 50 MG 24 hr tablet TAKE ONE TABLET BY MOUTH ONCE DAILY WITH  OR  IMMEDIATELY  FOLLOWING  A  MEAL 03/08/16  Yes [name], MD  rosuvastatin (CRESTOR) 40 MG tablet Take 1 tablet (40 mg total) by mouth daily. 03/03/16  Yes [name], MD  tiotropium (SPIRIVA) 18 MCG inhalation capsule Place 1 puff into inhaler and inhale daily.   Yes Historical Provider, MD    Allergies:  Allergies  Allergen Reactions  . Levaquin [Levofloxacin In D5w] Rash    Social History   Social History  . Marital Status: Single    Spouse Name: N/A  . Number of Children: N/A  . Years of Education: N/A   Occupational History  . Not on file.   Social History Main Topics  . Smoking status: Former Smoker --  1.50 packs/day for 45 years    Types: Cigarettes    Quit date: 11/27/2012  . Smokeless tobacco: Not on file  . Alcohol Use: No  . Drug Use: No  . Sexual Activity: Not on file   Other Topics Concern  . Not on file   Social History Narrative     Family History  Problem Relation Age of Onset  . Family history unknown: Yes     ROS:  Please see the history of present illness.     All other systems reviewed and negative.    Physical Exam: Blood pressure 134/58, pulse 60, height 5\' 6"  (1.676 m), weight 191 lb 6.4 oz (86.818 kg). General: Well  developed, well nourished male in no acute distress. Head: Normocephalic, atraumatic, sclera non-icteric, no xanthomas, nares are without discharge. EENT: normal  Lymph Nodes:  none Neck: Negative for carotid bruits. JVD not elevated. Back:without scoliosis kyphosis Lungs: Clear bilaterally to auscultation without wheezes, rales, or rhonchi. Breathing is unlabored. Heart: RRR with S1 S2. 2/6 murmur . No rubs, or gallops appreciated. Abdomen: Soft, non-tender, non-distended with normoactive bowel sounds. No hepatomegaly. No rebound/guarding. No obvious abdominal masses. Msk:  Strength and tone appear normal for age. Extremities: No clubbing or cyanosis. No  edema.  Distal pedal pulses are 2+ and equal bilaterally. Skin: Warm and Dry Neuro: Alert and oriented X 3. CN III-XII intact Grossly normal sensory and motor function . Psych:  Responds to questions appropriately with a normal affect.      Labs: Cardiac Enzymes No results for input(s): CKTOTAL, CKMB, TROPONINI in the last 72 hours. CBC Lab Results  Component Value Date   WBC 13.8* 11/04/2014   HGB 17.8 11/04/2014   HCT 53.6* 11/04/2014   MCV 100 11/04/2014   PLT 245 11/04/2014   PROTIME: No results for input(s): LABPROT, INR in the last 72 hours. Chemistry No results for input(s): NA, K, CL, CO2, BUN, CREATININE, CALCIUM, PROT, BILITOT, ALKPHOS, ALT, AST, GLUCOSE in the last 168 hours.  Invalid input(s): LABALBU Lipids No results found for: CHOL, HDL, LDLCALC, TRIG BNP No results found for: PROBNP Thyroid Function Tests: No results for input(s): TSH, T4TOTAL, T3FREE, THYROIDAB in the last 72 hours.  Invalid input(s): FREET3 Miscellaneous No results found for: DDIMER  Radiology/Studies:  No results found.  EKG:  Atrial flutter-atypical atrial flutter, length 240 ms Ventricular rate 60 Intervals-/11/35 Axis -49   Assessment and Plan:  Atrial flutterTypical and atypical likely reverse typical  Normal LV  function   The patient has typical and atypical atrial flutter. As mentioned this is likely reverse typical. We discussed catheter ablation not withstanding his absence of symptoms. It is reasonable to undertake this with the hopes that we can discontinue anticoagulation although recent papers have suggested that the likelihood of atrial fibrillation and a monitoring period of 1-2 years following ablation may be as high as 75%, still this gives him some opportunity to be free of anticoagulants.  We discussed procedural risks and complications including but not limited to vascular injury perforation heart block requiring pacing   for right now we'll continue him on his current medications      Virl Axe

## 2016-03-23 NOTE — Patient Instructions (Signed)
Medication Instructions: - Your physician recommends that you continue on your current medications as directed. Please refer to the Current Medication list given to you today.  Labwork: - none  Procedures/Testing: - Your physician has recommended that you have an ablation. Catheter ablation is a medical procedure used to treat some cardiac arrhythmias (irregular heartbeats). During catheter ablation, a long, thin, flexible tube is put into a blood vessel in your groin (upper thigh), or neck. This tube is called an ablation catheter. It is then guided to your heart through the blood vessel. Radio frequency waves destroy small areas of heart tissue where abnormal heartbeats may cause an arrhythmia to start. - please call Dr. Olin Pia nurse, Nira Conn, at [phone] if you decide to proceed.   Available dates as of today are: Friday 04/30/16  Follow-Up: - Pending   Any Additional Special Instructions Will Be Listed Below (If Applicable).     If you need a refill on your cardiac medications before your next appointment, please call your pharmacy.

## 2016-03-24 ENCOUNTER — Other Ambulatory Visit: Payer: Self-pay | Admitting: Cardiovascular Disease

## 2016-03-25 DIAGNOSIS — E1129 Type 2 diabetes mellitus with other diabetic kidney complication: Secondary | ICD-10-CM | POA: Diagnosis not present

## 2016-03-25 DIAGNOSIS — E781 Pure hyperglyceridemia: Secondary | ICD-10-CM | POA: Diagnosis not present

## 2016-03-25 DIAGNOSIS — E1165 Type 2 diabetes mellitus with hyperglycemia: Secondary | ICD-10-CM | POA: Diagnosis not present

## 2016-03-25 DIAGNOSIS — E785 Hyperlipidemia, unspecified: Secondary | ICD-10-CM | POA: Diagnosis not present

## 2016-03-25 DIAGNOSIS — E538 Deficiency of other specified B group vitamins: Secondary | ICD-10-CM | POA: Diagnosis not present

## 2016-03-25 DIAGNOSIS — R809 Proteinuria, unspecified: Secondary | ICD-10-CM | POA: Diagnosis not present

## 2016-04-10 DIAGNOSIS — J449 Chronic obstructive pulmonary disease, unspecified: Secondary | ICD-10-CM | POA: Diagnosis not present

## 2016-04-24 ENCOUNTER — Other Ambulatory Visit: Payer: Self-pay | Admitting: Cardiovascular Disease

## 2016-04-26 ENCOUNTER — Other Ambulatory Visit: Payer: Self-pay

## 2016-04-26 MED ORDER — DILTIAZEM HCL ER COATED BEADS 180 MG PO CP24: 180.0000 mg | ORAL_CAPSULE | Freq: Two times a day (BID) | ORAL | Status: DC

## 2016-04-26 NOTE — Telephone Encounter (Signed)
Refill sent for Cartia xt 180 mg

## 2016-04-28 DIAGNOSIS — E1129 Type 2 diabetes mellitus with other diabetic kidney complication: Secondary | ICD-10-CM | POA: Diagnosis not present

## 2016-04-28 DIAGNOSIS — J302 Other seasonal allergic rhinitis: Secondary | ICD-10-CM | POA: Diagnosis not present

## 2016-04-28 DIAGNOSIS — E1165 Type 2 diabetes mellitus with hyperglycemia: Secondary | ICD-10-CM | POA: Diagnosis not present

## 2016-04-28 DIAGNOSIS — I4892 Unspecified atrial flutter: Secondary | ICD-10-CM | POA: Diagnosis not present

## 2016-05-11 DIAGNOSIS — J449 Chronic obstructive pulmonary disease, unspecified: Secondary | ICD-10-CM | POA: Diagnosis not present

## 2016-05-26 DIAGNOSIS — E119 Type 2 diabetes mellitus without complications: Secondary | ICD-10-CM | POA: Diagnosis not present

## 2016-06-10 DIAGNOSIS — J449 Chronic obstructive pulmonary disease, unspecified: Secondary | ICD-10-CM | POA: Diagnosis not present

## 2016-06-17 DIAGNOSIS — I860 Sublingual varices: Secondary | ICD-10-CM | POA: Diagnosis not present

## 2016-07-01 DIAGNOSIS — R809 Proteinuria, unspecified: Secondary | ICD-10-CM | POA: Diagnosis not present

## 2016-07-01 DIAGNOSIS — E1129 Type 2 diabetes mellitus with other diabetic kidney complication: Secondary | ICD-10-CM | POA: Diagnosis not present

## 2016-07-07 ENCOUNTER — Other Ambulatory Visit: Payer: Self-pay | Admitting: Cardiovascular Disease

## 2016-07-08 DIAGNOSIS — E1129 Type 2 diabetes mellitus with other diabetic kidney complication: Secondary | ICD-10-CM | POA: Diagnosis not present

## 2016-07-08 DIAGNOSIS — E785 Hyperlipidemia, unspecified: Secondary | ICD-10-CM | POA: Diagnosis not present

## 2016-07-08 DIAGNOSIS — E538 Deficiency of other specified B group vitamins: Secondary | ICD-10-CM | POA: Diagnosis not present

## 2016-07-08 DIAGNOSIS — E781 Pure hyperglyceridemia: Secondary | ICD-10-CM | POA: Diagnosis not present

## 2016-07-08 DIAGNOSIS — R809 Proteinuria, unspecified: Secondary | ICD-10-CM | POA: Diagnosis not present

## 2016-07-08 DIAGNOSIS — E279 Disorder of adrenal gland, unspecified: Secondary | ICD-10-CM | POA: Diagnosis not present

## 2016-07-11 DIAGNOSIS — J449 Chronic obstructive pulmonary disease, unspecified: Secondary | ICD-10-CM | POA: Diagnosis not present

## 2016-07-21 ENCOUNTER — Other Ambulatory Visit: Payer: Self-pay | Admitting: Cardiovascular Disease

## 2016-07-29 DIAGNOSIS — J439 Emphysema, unspecified: Secondary | ICD-10-CM | POA: Diagnosis not present

## 2016-07-29 DIAGNOSIS — E1129 Type 2 diabetes mellitus with other diabetic kidney complication: Secondary | ICD-10-CM | POA: Diagnosis not present

## 2016-07-29 DIAGNOSIS — I4892 Unspecified atrial flutter: Secondary | ICD-10-CM | POA: Diagnosis not present

## 2016-07-29 DIAGNOSIS — Z23 Encounter for immunization: Secondary | ICD-10-CM | POA: Diagnosis not present

## 2016-08-11 DIAGNOSIS — J449 Chronic obstructive pulmonary disease, unspecified: Secondary | ICD-10-CM | POA: Diagnosis not present

## 2016-08-18 ENCOUNTER — Encounter: Payer: Self-pay | Admitting: Cardiovascular Disease

## 2016-08-18 ENCOUNTER — Other Ambulatory Visit: Payer: Self-pay | Admitting: *Deleted

## 2016-08-18 ENCOUNTER — Ambulatory Visit (INDEPENDENT_AMBULATORY_CARE_PROVIDER_SITE_OTHER): Payer: PPO | Admitting: Cardiovascular Disease

## 2016-08-18 ENCOUNTER — Ambulatory Visit (INDEPENDENT_AMBULATORY_CARE_PROVIDER_SITE_OTHER): Payer: PPO | Admitting: *Deleted

## 2016-08-18 VITALS — BP 126/60 | HR 66 | Ht 68.0 in | Wt 190.0 lb

## 2016-08-18 DIAGNOSIS — J432 Centrilobular emphysema: Secondary | ICD-10-CM | POA: Diagnosis not present

## 2016-08-18 DIAGNOSIS — I25119 Atherosclerotic heart disease of native coronary artery with unspecified angina pectoris: Secondary | ICD-10-CM

## 2016-08-18 DIAGNOSIS — I483 Typical atrial flutter: Secondary | ICD-10-CM

## 2016-08-18 DIAGNOSIS — J449 Chronic obstructive pulmonary disease, unspecified: Secondary | ICD-10-CM

## 2016-08-18 DIAGNOSIS — E1165 Type 2 diabetes mellitus with hyperglycemia: Secondary | ICD-10-CM

## 2016-08-18 DIAGNOSIS — I10 Essential (primary) hypertension: Secondary | ICD-10-CM

## 2016-08-18 DIAGNOSIS — IMO0002 Reserved for concepts with insufficient information to code with codable children: Secondary | ICD-10-CM

## 2016-08-18 DIAGNOSIS — E1159 Type 2 diabetes mellitus with other circulatory complications: Secondary | ICD-10-CM

## 2016-08-18 LAB — PULMONARY FUNCTION TEST
DL/VA % PRED: 80 %
DL/VA: 3.66 ml/min/mmHg/L
DLCO UNC % PRED: 58 %
DLCO unc: 18.08 ml/min/mmHg
FEF 25-75 PRE: 0.75 L/s
FEF 25-75 Post: 0.95 L/sec
FEF2575-%CHANGE-POST: 27 %
FEF2575-%PRED-PRE: 32 %
FEF2575-%Pred-Post: 41 %
FEV1-%Change-Post: 5 %
FEV1-%PRED-POST: 50 %
FEV1-%Pred-Pre: 48 %
FEV1-PRE: 1.31 L
FEV1-Post: 1.39 L
FEV1FVC-%Change-Post: -7 %
FEV1FVC-%Pred-Pre: 76 %
FEV6-%CHANGE-POST: 12 %
FEV6-%PRED-POST: 72 %
FEV6-%PRED-PRE: 64 %
FEV6-POST: 2.53 L
FEV6-PRE: 2.25 L
FEV6FVC-%CHANGE-POST: -1 %
FEV6FVC-%PRED-POST: 103 %
FEV6FVC-%PRED-PRE: 105 %
FVC-%CHANGE-POST: 14 %
FVC-%PRED-POST: 70 %
FVC-%Pred-Pre: 61 %
FVC-Post: 2.58 L
FVC-Pre: 2.25 L
POST FEV6/FVC RATIO: 98 %
PRE FEV1/FVC RATIO: 58 %
Post FEV1/FVC ratio: 54 %
Pre FEV6/FVC Ratio: 100 %

## 2016-08-18 NOTE — Progress Notes (Signed)
SMW performed today. 

## 2016-08-18 NOTE — Patient Instructions (Signed)
Medication Instructions:   No medication changes made  Labwork:  No new labs needed  Testing/Procedures:  No further testing at this time   We will schedule an appt with Dr. Caryl Comes on Oct 19th at [facility]: It was a pleasure seeing you in the office today. Please call us if you have new issues that need to be addressed before your next appt.  [phone]  Your physician wants you to follow-up in: 6 months.  You will receive a reminder letter in the mail two months in advance. If you don't receive a letter, please call our office to schedule the follow-up appointment.  If you need a refill on your cardiac medications before your next appointment, please call your pharmacy.

## 2016-08-18 NOTE — Progress Notes (Signed)
PFT performed today with nitrogen washout. 

## 2016-08-18 NOTE — Progress Notes (Signed)
Cardiology Office Note  Date:  08/18/2016   ID:  [name], DOB 05-27-1946, MRN [id number]  PCP:  Carlos Athens, MD   Chief Complaint  Patient presents with  . other    "doing well."     HPI:  [name] is a 70 year old gentleman, patient of [name], with  hypertension, diabetes, COPD, long smoking history, polycythemia,  tachycardia.  Cardiology evaluated the patient 10/29/2014. EKG showed atrial flutter with heart rate 140s up to 150.  long smoking history for 45 years.  In the hospital he was started on beta blockers, diltiazem, digoxin, eventually started on amiodarone infusion with anticoagulation, eliquis. He had TEE and cardioversion on 10/30/2014. Normal sinus rhythm was successfully restored and he was discharged in normal sinus rhythm Follow-up in the office on his last visit showed he had recurrent atrial flutter.  Interested in atrial flutter ablation Previously seen by Dr. Caryl Galvan and it was recommended he call the office if he would like to be scheduled for flutter ablation. In follow-up today, he was hoping his rhythm would be back in normal sinus and he would not need the ablation. That is why he is put off calling. He does have some mild shortness of breath on exertion, would like to come off his anticoagulation, as well as several of his other medications.  Denies any significant lower extremity edema Denies any recent upper respiratory tract infections Diabetes managed by [name] with his inhalers  EKG on today's visit shows atrial flutter with ventricular rate 66 bpm  Other past medical history reviewed  total cholesterol 167, LDL 100, hemoglobin A1c 7.7  Previous cardioversion on 01/07/2015 which successfully restored normal sinus rhythm In follow-up was found to be in atrial flutter in clinic  TEE report showed ejection fraction 45-50%, right and left atrium mildly enlarged, RV mildly dilated with mildly reduced systolic  function, small amount of plaque in the aorta  CT scan of the chest 10/29/2014 in the hospital showed bilateral predominantly lower lobe pulmonary opacity most consistent with pneumonia   PMH:   has a past medical history of Atrial flutter ([place]); COPD (chronic obstructive pulmonary disease) ([place]); Diabetes mellitus without complication ([place]); Hyperlipidemia; Hypertension; Polycythemia; and Tachycardia.  PSH:    Past Surgical History:  Procedure Laterality Date  . TEE WITH CARDIOVERSION  12/16    Current Outpatient Prescriptions  Medication Sig Dispense Refill  . budesonide-formoterol (SYMBICORT) 80-4.5 MCG/ACT inhaler Inhale 1 puff into the lungs 2 times daily at 12 noon and 4 pm.    . Cyanocobalamin 1000 MCG TBCR Take 1,000 mcg by mouth daily.    . digoxin (LANOXIN) 0.25 MG tablet TAKE ONE TABLET BY MOUTH ONCE DAILY 30 tablet 6  . diltiazem (CARTIA XT) 180 MG 24 hr capsule Take 1 capsule (180 mg total) by mouth 2 (two) times daily. 60 capsule 3  . ELIQUIS 5 MG TABS tablet TAKE ONE TABLET BY MOUTH TWICE DAILY 60 tablet 3  . glucose blood (FREESTYLE LITE) test strip Check blood sugar 2x a day as directed    . metFORMIN (GLUCOPHAGE) 1000 MG tablet Take 1 tablet (1,000 mg total) by mouth 2 (two) times daily with a meal. 180 tablet 0  . metoprolol succinate (TOPROL-XL) 50 MG 24 hr tablet TAKE ONE TABLET BY MOUTH ONCE DAILY WITH OR IMMEDIATELY FOLLOWING A MEAL. 30 tablet 3  . rosuvastatin (CRESTOR) 40 MG tablet Take 1 tablet (40 mg total) by mouth daily. 30 tablet 11  . tiotropium (  SPIRIVA) 18 MCG inhalation capsule Place 1 puff into inhaler and inhale daily.     No current facility-administered medications for this visit.      Allergies:   Levaquin [levofloxacin in d5w]   Social History:  The patient  reports that he quit smoking about 3 years ago. His smoking use included Cigarettes. He has a 67.50 pack-year smoking history. He has never used smokeless tobacco. He reports that he does  not drink alcohol or use drugs.   Family History:   Family history is unknown by patient.    Review of Systems: Review of Systems  Constitutional: Negative.   Respiratory: Positive for shortness of breath.   Cardiovascular: Negative.   Gastrointestinal: Negative.   Musculoskeletal: Negative.   Neurological: Negative.   Psychiatric/Behavioral: Negative.   All other systems reviewed and are negative.    PHYSICAL EXAM: VS:  BP 126/60 (BP Location: Left Arm, Patient Position: Sitting, Cuff Size: Normal)   Pulse 66   Ht 5\' 8"  (1.727 m)   Wt 190 lb (86.2 kg)   BMI 28.89 kg/m  , BMI Body mass index is 28.89 kg/m. GEN: Well nourished, well developed, in no acute distress  HEENT: normal  Neck: no JVD, carotid bruits, or masses Cardiac: RRR; no murmurs, rubs, or gallops,no edema  Respiratory:  Mildly decreased breath sounds throughout, normal work of breathing GI: soft, nontender, nondistended, + BS MS: no deformity or atrophy  Skin: warm and dry, no rash Neuro:  Strength and sensation are intact Psych: euthymic mood, full affect    Recent Labs: No results found for requested labs within last 8760 hours.    Lipid Panel No results found for: CHOL, HDL, LDLCALC, TRIG    Wt Readings from Last 3 Encounters:  08/18/16 190 lb (86.2 kg)  03/23/16 191 lb 6.4 oz (86.8 kg)  03/03/16 195 lb 8 oz (88.7 kg)       ASSESSMENT AND PLAN:  Essential hypertension - Plan: EKG 12-Lead, CANCELED: EKG 12-Lead Blood pressure is well controlled on today's visit. No changes made to the medications.  Coronary artery disease involving native coronary artery of native heart with angina pectoris ([place]) - Plan: EKG 12-Lead, CANCELED: EKG 12-Lead Currently with no symptoms of angina. No further workup at this time. Continue current medication regimen.  Typical atrial flutter ([place]) - Plan: EKG 12-Lead Long discussion with him concerning management of his atrial flutter He is interested in  ablation We have set up follow-up visits with Dr. Caryl Galvan October 19 He would like to, of his anticoagulation among some of his other medications. Given underlying shortness of breath, unclear if this is secondary to flutter, COPD or both Risk and benefit of the procedure discussed with him in detail.  Centrilobular emphysema ([place]) Stop smoking many years ago, maintained on his inhalers Has periodic follow-up with pulmonary  Uncontrolled type 2 diabetes mellitus with other circulatory complication, without long-term current use of insulin (HCC) No recent A1c available Previously was 7.0 in 2015 Followed by primary care We have encouraged continued exercise, careful diet management in an effort to lose weight.   Total encounter time more than 25 minutes  Greater than 50% was spent in counseling and coordination of care with the patient   Disposition:   F/U  6 months   Orders Placed This Encounter  Procedures  . EKG 12-Lead     Signed, Esmond Plants, M.D., Ph.D. 08/18/2016  [place], [place]

## 2016-08-19 ENCOUNTER — Ambulatory Visit: Payer: PPO | Admitting: Cardiovascular Disease

## 2016-09-02 ENCOUNTER — Ambulatory Visit (INDEPENDENT_AMBULATORY_CARE_PROVIDER_SITE_OTHER): Payer: PPO | Admitting: Internal Medicine

## 2016-09-02 ENCOUNTER — Encounter: Payer: Self-pay | Admitting: Internal Medicine

## 2016-09-02 VITALS — BP 132/62 | HR 64 | Ht 68.0 in | Wt 189.5 lb

## 2016-09-02 DIAGNOSIS — I4892 Unspecified atrial flutter: Secondary | ICD-10-CM

## 2016-09-02 DIAGNOSIS — I483 Typical atrial flutter: Secondary | ICD-10-CM

## 2016-09-02 DIAGNOSIS — Z01812 Encounter for preprocedural laboratory examination: Secondary | ICD-10-CM | POA: Diagnosis not present

## 2016-09-02 DIAGNOSIS — I10 Essential (primary) hypertension: Secondary | ICD-10-CM

## 2016-09-02 NOTE — Patient Instructions (Addendum)
Medication Instructions: - Your physician recommends that you continue on your current medications as directed. Please refer to the Current Medication list given to you today.  Labwork: - Your physician recommends that you return for lab work & an EKG (nurse visit)- 10/25/16  Procedures/Testing: - Your physician has recommended that you have an atrial flutter ablation. Catheter ablation is a medical procedure used to treat some cardiac arrhythmias (irregular heartbeats). During catheter ablation, a long, thin, flexible tube is put into a blood vessel in your groin (upper thigh), or neck. This tube is called an ablation catheter. It is then guided to your heart through the blood vessel. Radio frequency waves destroy small areas of heart tissue where abnormal heartbeats may cause an arrhythmia to start.   This will be scheduled for Wednesday 10/27/16 with Dr. Caryl Comes at [facility]. Dr. Olin Pia nurse, Nira Conn, will mail you your detailed letter of instructions  - Your physician has requested that you have an echocardiogram. Echocardiography is a painless test that uses sound waves to create images of your heart. It provides your doctor with information about the size and shape of your heart and how well your heart's chambers and valves are working. This procedure takes approximately one hour. There are no restrictions for this procedure.  Follow-Up: - Your physician recommends that you schedule a follow-up appointment in: 4 weeks (from 10/27/16) with Dr. Caryl Comes - post flutter ablation   Any Additional Special Instructions Will Be Listed Below (If Applicable).     If you need a refill on your cardiac medications before your next appointment, please call your pharmacy.

## 2016-09-02 NOTE — Progress Notes (Signed)
Patient Care Team: [name], MD as PCP - General (Cardiology) [name], MD as Consulting Physician (Cardiology)   HPI  [name] is a 70 y.o. male Seen in followup of atrial flutter presumed typical and reverse typical   When seen in 5/17 he had wanted to continue medical therapy;  He ghas now had recurrent flutter and saw Dr Deidre Ala 10/4 and notes reveiwed and he would like to pursue ablation. Interestingly, he has no attributable symptoms. He denies palpitations or exercise intolerance. On apixoban ; no bleeding issues No echo  CHA2DS2/VAS Stroke Risk Points    0 Has Congestive Heart Failure:  No   1 Has Vascular Disease:  Yes   1 Has Hypertension:  Yes   0 Age:  28   1 Has Diabetes:  Yes   0 Had Stroke:  No Had TIA:  No Had thromboembolism:  No   0 Male:  No          Records and Results Reviewed outpatient records and prior ECGs  1/17 Cr 0.9 K4.1   Past Medical History:  Diagnosis Date  . Atrial flutter ([place])   . COPD (chronic obstructive pulmonary disease) ([place])   . Diabetes mellitus without complication ([place])   . Hyperlipidemia   . Hypertension   . Polycythemia     Past Surgical History:  Procedure Laterality Date  . TEE WITH CARDIOVERSION  12/16    Current Outpatient Prescriptions  Medication Sig Dispense Refill  . budesonide-formoterol (SYMBICORT) 80-4.5 MCG/ACT inhaler Inhale 1 puff into the lungs 2 times daily at 12 noon and 4 pm.    . Cyanocobalamin 1000 MCG TBCR Take 1,000 mcg by mouth daily.    . digoxin (LANOXIN) 0.25 MG tablet TAKE ONE TABLET BY MOUTH ONCE DAILY 30 tablet 6  . diltiazem (CARTIA XT) 180 MG 24 hr capsule Take 1 capsule (180 mg total) by mouth 2 (two) times daily. 60 capsule 3  . ELIQUIS 5 MG TABS tablet TAKE ONE TABLET BY MOUTH TWICE DAILY 60 tablet 3  . glucose blood (FREESTYLE LITE) test strip Check blood sugar 2x a day as directed    . metFORMIN (GLUCOPHAGE) 1000 MG tablet Take 1 tablet (1,000 mg total)  by mouth 2 (two) times daily with a meal. 180 tablet 0  . metoprolol succinate (TOPROL-XL) 50 MG 24 hr tablet TAKE ONE TABLET BY MOUTH ONCE DAILY WITH OR IMMEDIATELY FOLLOWING A MEAL. 30 tablet 3  . rosuvastatin (CRESTOR) 40 MG tablet Take 1 tablet (40 mg total) by mouth daily. 30 tablet 11  . tiotropium (SPIRIVA) 18 MCG inhalation capsule Place 1 puff into inhaler and inhale daily.     No current facility-administered medications for this visit.     Allergies  Allergen Reactions  . Levaquin [Levofloxacin In D5w] Rash      Review of Systems negative except from HPI and PMH  Physical Exam BP 132/62 (BP Location: Left Arm, Patient Position: Sitting, Cuff Size: Normal)   Pulse 64   Ht 5\' 8"  (1.727 m)   Wt 189 lb 8 oz (86 kg)   BMI 28.81 kg/m  Well developed and well nourished in no acute distress HENT normal E scleral and icterus clear Neck Supple JVP flat; carotids brisk and full Clear to ausculation Irregular Regular rate and rhythm, no murmurs gallops or rub Soft with active bowel sounds No clubbing cyanosis  Edema Alert and oriented, grossly normal motor and sensory function Skin Warm  and Dry  ECG demonstrates atrial flutter. Inferior flutter waves are negative, interestingly flutter wave in V1 are biphasic suggesting a non-isthmus dependent flutter  Assessment and  Plan Atrial flutter  The patient has atrial flutter which is likely isthmus dependent. He would like to pursue EP testing and catheter ablation in the hopes that he can get off his anticoagulation. I would concur. We have discussed risks and benefits.  There are a number of ongoing answer questions. First we need to look at left ventricular function and left atrial size  Needs echo , We need to reassess renal function and check his hemoglobin on anticoagulation  On Anticoagulation;  No bleeding issues      Current medicines are reviewed at length with the patient today .  The patient does not  have  concerns regarding medicines.

## 2016-09-02 NOTE — Addendum Note (Signed)
Addended by: Alvis Lemmings C on: 09/02/2016 10:30 AM   Modules accepted: Orders

## 2016-09-07 ENCOUNTER — Encounter: Payer: Self-pay | Admitting: Internal Medicine

## 2016-09-07 ENCOUNTER — Ambulatory Visit (INDEPENDENT_AMBULATORY_CARE_PROVIDER_SITE_OTHER): Payer: PPO | Admitting: Internal Medicine

## 2016-09-07 VITALS — BP 140/68 | HR 53 | Ht 69.0 in | Wt 190.0 lb

## 2016-09-07 DIAGNOSIS — J432 Centrilobular emphysema: Secondary | ICD-10-CM

## 2016-09-07 NOTE — Assessment & Plan Note (Signed)
Currently COPD is well controlled  Given optimal treatment with Symbicort and Spiriva for a number of years by his primary care physician. Patient is currently asymptomatic, only major complaint is some mild dyspnea on severe exertion. CAT 2,  mMRC 0 His COPD is stable and well controlled. Review of his last CT in 10/2014 does not show significant structural lung disease. Pulmonary function testing 05/07/2015: FEV1 60%, FEV1/FVC 57%. Moderate obstruction and a second dilator response, moderate decrease in DLCO. PFT 08/24/16: FEV1 48%, FEV1/FVC: 58% - significant drop with mod-severe obstruction - patient had URI when taking this test. Repeat spiro in office today not significantly better, but there were also technical issues.  Plan: - follow up in 3 months, repeat in office spirometry at this vist.  - stable mild COPD - Continue abstaining from tobacco - Continue with Spiriva and Symbicort

## 2016-09-07 NOTE — Progress Notes (Signed)
MRN# [id number] [name] December 29, 1945   CC: Chief Complaint  Patient presents with  . Follow-up    SMW/PFT results; pt feels well other than little cold; no complaints      Brief History: HPI 01/03/15 -70 year old male with a past medical history significant for atrial flutter, COPD, hypertension referred by Dr.Golan for COPD optimization and preop pulmonary evaluation prior to scheduled cardioversion for atrial flutter. Patient had a recent admission to the [facility] in December 2050 for 8 days, he was noted to be tachycardic at his oncology office for which he is following fall polycythemia vera do not citation he was noted to be in atrial flutter, also noted to have a pneumonia (chest x-ray showed patchy airspace disease); at that time he was treated with antibiotics and steroids. Patient states that he's never been hospitalized for breathing issues are her to be intubated for any type of respiratory distress. He states that he gets a yearly cold but does not need admission for antibiotics every year. His primary care physician has been on Symbicort and Spiriva for which he uses on a regular basis and rinses afterwards. He does not endorse a morning cough, or sputum production. He quit smoking 2 years ago. He is currently following with cardiology and will have a scheduled cardioversion for atrial flutter early next week. Plan - COPD well controlled on Symbicort and Spiriva, follow up in 1 month after cardioversion   Events since last clinic visit: Patient presents today for follow-up visit of his COPD. Overall he states he is doing well. He stated he got his flu shot his PMD office last month. About 2-3 weeks ago stating that he may have had a upper respiratory tract infection, but is now back to baseline. Today he is here to review his PFTs and 6 minute walk test.  Medication:    Current Outpatient Prescriptions:  .  budesonide-formoterol (SYMBICORT) 80-4.5  MCG/ACT inhaler, Inhale 1 puff into the lungs 2 times daily at 12 noon and 4 pm., Disp: , Rfl:  .  Cyanocobalamin 1000 MCG TBCR, Take 1,000 mcg by mouth daily., Disp: , Rfl:  .  digoxin (LANOXIN) 0.25 MG tablet, TAKE ONE TABLET BY MOUTH ONCE DAILY, Disp: 30 tablet, Rfl: 6 .  diltiazem (CARTIA XT) 180 MG 24 hr capsule, Take 1 capsule (180 mg total) by mouth 2 (two) times daily., Disp: 60 capsule, Rfl: 3 .  ELIQUIS 5 MG TABS tablet, TAKE ONE TABLET BY MOUTH TWICE DAILY, Disp: 60 tablet, Rfl: 3 .  glucose blood (FREESTYLE LITE) test strip, Check blood sugar 2x a day as directed, Disp: , Rfl:  .  metFORMIN (GLUCOPHAGE) 1000 MG tablet, Take 1 tablet (1,000 mg total) by mouth 2 (two) times daily with a meal., Disp: 180 tablet, Rfl: 0 .  metoprolol succinate (TOPROL-XL) 50 MG 24 hr tablet, TAKE ONE TABLET BY MOUTH ONCE DAILY WITH OR IMMEDIATELY FOLLOWING A MEAL., Disp: 30 tablet, Rfl: 3 .  rosuvastatin (CRESTOR) 40 MG tablet, Take 1 tablet (40 mg total) by mouth daily., Disp: 30 tablet, Rfl: 11 .  tiotropium (SPIRIVA) 18 MCG inhalation capsule, Place 1 puff into inhaler and inhale daily., Disp: , Rfl: \   Review of Systems: Gen:  Denies  fever, sweats, chills HEENT: Denies blurred vision, double vision, ear pain, eye pain, hearing loss, nose bleeds, sore throat Cvc:  No dizziness, chest pain or heaviness Resp:   Admits to: Mild shortness of breath with exertion, this is chronic, mild  productive whitish sputum over the past 3 days Gi: Denies swallowing difficulty, stomach pain, nausea or vomiting, diarrhea, constipation, bowel incontinence Gu:  Denies bladder incontinence, burning urine Ext:   No Joint pain, stiffness or swelling Skin: No skin rash, easy bruising or bleeding or hives Endoc:  No polyuria, polydipsia , polyphagia or weight change Other:  All other systems negative  Allergies:  Levaquin [levofloxacin in d5w]  Physical Examination:  VS: BP 140/68 (BP Location: Left Arm, Cuff Size:  Normal)   Pulse (!) 53   Ht 5\' 9"  (1.753 m)   Wt 190 lb (86.2 kg)   SpO2 96%   BMI 28.06 kg/m   General Appearance: No distress  HEENT: PERRLA, no ptosis, no other lesions noticed Pulmonary:normal breath sounds., diaphragmatic excursion normal.No wheezing, No rales   Cardiovascular:  Normal S1,S2.  No m/r/g.     Abdomen:Exam: Benign, Soft, non-tender, No masses  Skin:   warm, no rashes, no ecchymosis  Extremities: normal, no cyanosis, clubbing, warm with normal capillary refill.     Pulmonary function testing 05/07/2015 FEV1 62% FEV1/FVC 57% RV 136 TLC 93 RV/TLC 144 DLCO uncorrected 67% Impression: Moderate obstruction with no significant broncho-dilator response. Moderate decrease in DLCO.  Coronary function testing 08/18/2016 FEV1 40% FEV1/FVC 58% RV 170 TLC 95 RV/TLC 175 DLCO 58 Impression: Moderate-severe obstruction with minor bronchodilator response, 14% change in FVC, moderate decrease in DLCO 6MWT - total distance 1181 feet/360 m, low saturation 92%, highest heart rate 64; dyspnea score at the end of test is 0.   Assessment and Plan: 70 year old male past medical history of tobacco abuse, seen in consultation for COPD follow-up. COPD type A Currently COPD is well controlled  Given optimal treatment with Symbicort and Spiriva for a number of years by his primary care physician. Patient is currently asymptomatic, only major complaint is some mild dyspnea on severe exertion. CAT 2,  mMRC 0 His COPD is stable and well controlled. Review of his last CT in 10/2014 does not show significant structural lung disease. Pulmonary function testing 05/07/2015: FEV1 60%, FEV1/FVC 57%. Moderate obstruction and a second dilator response, moderate decrease in DLCO. PFT 08/24/16: FEV1 48%, FEV1/FVC: 58% - significant drop with mod-severe obstruction - patient had URI when taking this test. Repeat spiro in office today not significantly better, but there were also technical  issues.  Plan: - follow up in 3 months, repeat in office spirometry at this vist.  - stable mild COPD - Continue abstaining from tobacco - Continue with Spiriva and Symbicort         Updated Medication List Outpatient Encounter Prescriptions as of 09/07/2016  Medication Sig  . budesonide-formoterol (SYMBICORT) 80-4.5 MCG/ACT inhaler Inhale 1 puff into the lungs 2 times daily at 12 noon and 4 pm.  . Cyanocobalamin 1000 MCG TBCR Take 1,000 mcg by mouth daily.  . digoxin (LANOXIN) 0.25 MG tablet TAKE ONE TABLET BY MOUTH ONCE DAILY  . diltiazem (CARTIA XT) 180 MG 24 hr capsule Take 1 capsule (180 mg total) by mouth 2 (two) times daily.  Marland Kitchen ELIQUIS 5 MG TABS tablet TAKE ONE TABLET BY MOUTH TWICE DAILY  . glucose blood (FREESTYLE LITE) test strip Check blood sugar 2x a day as directed  . metFORMIN (GLUCOPHAGE) 1000 MG tablet Take 1 tablet (1,000 mg total) by mouth 2 (two) times daily with a meal.  . metoprolol succinate (TOPROL-XL) 50 MG 24 hr tablet TAKE ONE TABLET BY MOUTH ONCE DAILY WITH OR IMMEDIATELY FOLLOWING A MEAL.  Marland Kitchen  rosuvastatin (CRESTOR) 40 MG tablet Take 1 tablet (40 mg total) by mouth daily.  Marland Kitchen tiotropium (SPIRIVA) 18 MCG inhalation capsule Place 1 puff into inhaler and inhale daily.   No facility-administered encounter medications on file as of 09/07/2016.     Orders for this visit: Orders Placed This Encounter  Procedures  . Spirometry with Graph    Order Specific Question:   Where should this test be performed?    Answer:   [facility]  . Spirometry with Graph    Order Specific Question:   Where should this test be performed?    Answer:   [facility]    Thank  you for the visitation and for allowing  [facility] to assist in the care of your patient. Our recommendations are noted above.  Please contact us if we can be of further service.  Vilinda Boehringer, MD  Pulmonary and Critical Care Office Number: [phone]

## 2016-09-07 NOTE — Patient Instructions (Signed)
Follow up in 3 months - we will repeat an in office spirometry at follow up visit - cont with current inhalers - con with diet and exercise as tolerated.

## 2016-09-10 DIAGNOSIS — J449 Chronic obstructive pulmonary disease, unspecified: Secondary | ICD-10-CM | POA: Diagnosis not present

## 2016-09-23 ENCOUNTER — Other Ambulatory Visit: Payer: Self-pay

## 2016-09-23 ENCOUNTER — Ambulatory Visit (INDEPENDENT_AMBULATORY_CARE_PROVIDER_SITE_OTHER): Payer: PPO

## 2016-09-23 DIAGNOSIS — I483 Typical atrial flutter: Secondary | ICD-10-CM | POA: Diagnosis not present

## 2016-10-05 ENCOUNTER — Encounter: Payer: Self-pay | Admitting: Internal Medicine

## 2016-10-05 NOTE — Telephone Encounter (Signed)
This encounter was created in error - please disregard.

## 2016-10-06 ENCOUNTER — Encounter: Payer: Self-pay | Admitting: *Deleted

## 2016-10-06 DIAGNOSIS — E1129 Type 2 diabetes mellitus with other diabetic kidney complication: Secondary | ICD-10-CM | POA: Diagnosis not present

## 2016-10-06 DIAGNOSIS — R809 Proteinuria, unspecified: Secondary | ICD-10-CM | POA: Diagnosis not present

## 2016-10-06 DIAGNOSIS — E781 Pure hyperglyceridemia: Secondary | ICD-10-CM | POA: Diagnosis not present

## 2016-10-06 DIAGNOSIS — E538 Deficiency of other specified B group vitamins: Secondary | ICD-10-CM | POA: Diagnosis not present

## 2016-10-11 DIAGNOSIS — J449 Chronic obstructive pulmonary disease, unspecified: Secondary | ICD-10-CM | POA: Diagnosis not present

## 2016-10-14 DIAGNOSIS — E785 Hyperlipidemia, unspecified: Secondary | ICD-10-CM | POA: Diagnosis not present

## 2016-10-14 DIAGNOSIS — E1129 Type 2 diabetes mellitus with other diabetic kidney complication: Secondary | ICD-10-CM | POA: Diagnosis not present

## 2016-10-14 DIAGNOSIS — E538 Deficiency of other specified B group vitamins: Secondary | ICD-10-CM | POA: Diagnosis not present

## 2016-10-14 DIAGNOSIS — E781 Pure hyperglyceridemia: Secondary | ICD-10-CM | POA: Diagnosis not present

## 2016-10-14 DIAGNOSIS — R109 Unspecified abdominal pain: Secondary | ICD-10-CM | POA: Diagnosis not present

## 2016-10-14 DIAGNOSIS — R809 Proteinuria, unspecified: Secondary | ICD-10-CM | POA: Diagnosis not present

## 2016-10-14 DIAGNOSIS — E279 Disorder of adrenal gland, unspecified: Secondary | ICD-10-CM | POA: Diagnosis not present

## 2016-10-22 ENCOUNTER — Other Ambulatory Visit: Payer: Self-pay | Admitting: Cardiovascular Disease

## 2016-10-25 ENCOUNTER — Ambulatory Visit (INDEPENDENT_AMBULATORY_CARE_PROVIDER_SITE_OTHER): Payer: PPO | Admitting: *Deleted

## 2016-10-25 ENCOUNTER — Other Ambulatory Visit: Payer: PPO

## 2016-10-25 VITALS — HR 62 | Resp 18

## 2016-10-25 DIAGNOSIS — Z01812 Encounter for preprocedural laboratory examination: Secondary | ICD-10-CM | POA: Diagnosis not present

## 2016-10-25 DIAGNOSIS — I483 Typical atrial flutter: Secondary | ICD-10-CM

## 2016-10-25 NOTE — Patient Instructions (Addendum)
1.) Reason for visit: Patient here to get pre-op labs and EKG for A. Flutter ablation on 10/27/16.  2.) Name of MD requesting visit: Dr Caryl Comes  3.) H&P: Hx atrial flutter, patient for a flutter ablation on 10/27/16 at [facility] with Dr Caryl Comes.  4.) ROS related to problem: No complaint of chest pain, palpitations today. Patient had repeat EKG today and lab work drawn. Patient for ablation on 10/27/16. Letter from [facility] with instructions for a flutter ablation given and reviewed with patient. He verbalized understanding.  Samples of Eliquis 5 mg given to patient per request, he is in donut hole.  Lot # [id number]   Expiration: March 2020       3 boxes (14 tablets each box) given to patient.  5.) Assessment and plan per MD: EKG given to Dr Rockey Situ to review and sign off. Patient is in A flutter today. Continue current treatment, medications and plan to have a flutter ablation on 10/27/16 with Dr Caryl Comes.

## 2016-10-26 LAB — CBC WITH DIFFERENTIAL/PLATELET
BASOS ABS: 0 10*3/uL (ref 0.0–0.2)
BASOS: 0 %
EOS (ABSOLUTE): 0 10*3/uL (ref 0.0–0.4)
Eos: 0 %
Hematocrit: 47.5 % (ref 37.5–51.0)
Hemoglobin: 16.2 g/dL (ref 13.0–17.7)
LYMPHS ABS: 2.2 10*3/uL (ref 0.7–3.1)
LYMPHS: 21 %
MCH: 33.1 pg — ABNORMAL HIGH (ref 26.6–33.0)
MCHC: 34.1 g/dL (ref 31.5–35.7)
MCV: 97 fL (ref 79–97)
MONOS ABS: 1 10*3/uL — AB (ref 0.1–0.9)
Monocytes: 10 %
NEUTROS PCT: 67 %
Neutrophils Absolute: 7 10*3/uL (ref 1.4–7.0)
PLATELETS: 207 10*3/uL (ref 150–379)
RBC: 4.89 x10E6/uL (ref 4.14–5.80)
RDW: 11.8 % — AB (ref 12.3–15.4)
WBC: 10.4 10*3/uL (ref 3.4–10.8)

## 2016-10-26 LAB — IMMATURE CELLS
METAMYELOCYTES: 1 % — AB (ref 0–0)
MYELOCYTES: 1 % — ABNORMAL HIGH (ref 0–0)

## 2016-10-26 LAB — BASIC METABOLIC PANEL
BUN / CREAT RATIO: 13 (ref 10–24)
BUN: 26 mg/dL (ref 8–27)
CALCIUM: 9.5 mg/dL (ref 8.6–10.2)
CHLORIDE: 98 mmol/L (ref 96–106)
CO2: 26 mmol/L (ref 18–29)
Creatinine, Ser: 1.98 mg/dL — ABNORMAL HIGH (ref 0.76–1.27)
GFR calc non Af Amer: 33 mL/min/{1.73_m2} — ABNORMAL LOW (ref >59)
GFR, EST AFRICAN AMERICAN: 38 mL/min/{1.73_m2} — AB (ref >59)
GLUCOSE: 170 mg/dL — AB (ref 65–99)
POTASSIUM: 5 mmol/L (ref 3.5–5.2)
Sodium: 138 mmol/L (ref 134–144)

## 2016-10-26 NOTE — Anesthesia Preprocedure Evaluation (Addendum)
Anesthesia Evaluation  Patient identified by MRN, date of birth, ID band Patient awake    Reviewed: Allergy & Precautions, H&P , NPO status , Patient's Chart, lab work & pertinent test results, reviewed documented beta blocker date and time   Airway Mallampati: II  TM Distance: >3 FB Neck ROM: Full    Dental no notable dental hx. (+) Poor Dentition, Dental Advisory Given   Pulmonary neg pulmonary ROS, COPD,  COPD inhaler, former smoker,    Pulmonary exam normal breath sounds clear to auscultation       Cardiovascular Exercise Tolerance: Good hypertension, Pt. on medications and Pt. on home beta blockers negative cardio ROS   Rhythm:Irregular Rate:Normal     Neuro/Psych negative neurological ROS  negative psych ROS   GI/Hepatic negative GI ROS, Neg liver ROS,   Endo/Other  negative endocrine ROSdiabetes, Type 2, Oral Hypoglycemic Agents  Renal/GU negative Renal ROS  negative genitourinary   Musculoskeletal   Abdominal   Peds  Hematology negative hematology ROS (+)   Anesthesia Other Findings   Reproductive/Obstetrics negative OB ROS                            Anesthesia Physical Anesthesia Plan  ASA: III  Anesthesia Plan: General   Post-op Pain Management:    Induction: Intravenous  Airway Management Planned: Oral ETT and LMA  Additional Equipment:   Intra-op Plan:   Post-operative Plan: Extubation in OR  Informed Consent: I have reviewed the patients History and Physical, chart, labs and discussed the procedure including the risks, benefits and alternatives for the proposed anesthesia with the patient or authorized representative who has indicated his/her understanding and acceptance.   Dental advisory given  Plan Discussed with: CRNA  Anesthesia Plan Comments:         Anesthesia Quick Evaluation

## 2016-10-27 ENCOUNTER — Other Ambulatory Visit (INDEPENDENT_AMBULATORY_CARE_PROVIDER_SITE_OTHER): Payer: PPO

## 2016-10-27 ENCOUNTER — Encounter (HOSPITAL_COMMUNITY): Payer: Self-pay | Admitting: Anesthesiology

## 2016-10-27 ENCOUNTER — Encounter (HOSPITAL_COMMUNITY)
Admission: RE | Disposition: A | Discharge status: Home / Self Care | Payer: Self-pay | Source: Ambulatory Visit | Attending: Internal Medicine

## 2016-10-27 ENCOUNTER — Ambulatory Visit (HOSPITAL_COMMUNITY): Payer: PPO | Admitting: Anesthesiology

## 2016-10-27 ENCOUNTER — Ambulatory Visit (HOSPITAL_COMMUNITY)
Admission: RE | Admit: 2016-10-27 | Discharge: 2016-10-28 | Disposition: A | Discharge status: Home / Self Care | Payer: PPO | Source: Ambulatory Visit | Attending: Internal Medicine | Admitting: Internal Medicine

## 2016-10-27 ENCOUNTER — Other Ambulatory Visit: Payer: Self-pay | Admitting: Oncology

## 2016-10-27 DIAGNOSIS — Z7901 Long term (current) use of anticoagulants: Secondary | ICD-10-CM | POA: Insufficient documentation

## 2016-10-27 DIAGNOSIS — E119 Type 2 diabetes mellitus without complications: Secondary | ICD-10-CM | POA: Diagnosis not present

## 2016-10-27 DIAGNOSIS — I4892 Unspecified atrial flutter: Secondary | ICD-10-CM | POA: Insufficient documentation

## 2016-10-27 DIAGNOSIS — E785 Hyperlipidemia, unspecified: Secondary | ICD-10-CM | POA: Insufficient documentation

## 2016-10-27 DIAGNOSIS — D751 Secondary polycythemia: Secondary | ICD-10-CM | POA: Insufficient documentation

## 2016-10-27 DIAGNOSIS — Z7984 Long term (current) use of oral hypoglycemic drugs: Secondary | ICD-10-CM | POA: Diagnosis not present

## 2016-10-27 DIAGNOSIS — J449 Chronic obstructive pulmonary disease, unspecified: Secondary | ICD-10-CM | POA: Insufficient documentation

## 2016-10-27 DIAGNOSIS — I483 Typical atrial flutter: Secondary | ICD-10-CM

## 2016-10-27 DIAGNOSIS — Z87891 Personal history of nicotine dependence: Secondary | ICD-10-CM | POA: Insufficient documentation

## 2016-10-27 DIAGNOSIS — I10 Essential (primary) hypertension: Secondary | ICD-10-CM | POA: Insufficient documentation

## 2016-10-27 HISTORY — PX: ELECTROPHYSIOLOGIC STUDY: SHX172A

## 2016-10-27 LAB — GLUCOSE, CAPILLARY
GLUCOSE-CAPILLARY: 158 mg/dL — AB (ref 65–99)
GLUCOSE-CAPILLARY: 183 mg/dL — AB (ref 65–99)
GLUCOSE-CAPILLARY: 204 mg/dL — AB (ref 65–99)
Glucose-Capillary: 135 mg/dL — ABNORMAL HIGH (ref 65–99)
Glucose-Capillary: 254 mg/dL — ABNORMAL HIGH (ref 65–99)

## 2016-10-27 SURGERY — A-FLUTTER ABLATION
Anesthesia: General

## 2016-10-27 MED ORDER — SODIUM CHLORIDE 0.9 % IV SOLN
INTRAVENOUS | Status: DC
  Administered 2016-10-27: 08:00:00 via INTRAVENOUS

## 2016-10-27 MED ORDER — LIDOCAINE HCL (CARDIAC) 20 MG/ML IV SOLN
INTRAVENOUS | Status: DC | PRN
  Administered 2016-10-27: 60 mg via INTRAVENOUS

## 2016-10-27 MED ORDER — METFORMIN HCL 500 MG PO TABS
1000.0000 mg | ORAL_TABLET | Freq: Two times a day (BID) | ORAL | Status: DC
  Administered 2016-10-28: 1000 mg via ORAL
  Filled 2016-10-27: qty 2

## 2016-10-27 MED ORDER — ONDANSETRON HCL 4 MG/2ML IJ SOLN
4.0000 mg | Freq: Four times a day (QID) | INTRAMUSCULAR | Status: DC | PRN
Start: 2016-10-27 — End: 2016-10-28

## 2016-10-27 MED ORDER — ROSUVASTATIN CALCIUM 10 MG PO TABS
40.0000 mg | ORAL_TABLET | Freq: Every day | ORAL | Status: DC
  Administered 2016-10-27: 40 mg via ORAL
  Filled 2016-10-27: qty 4

## 2016-10-27 MED ORDER — PHENYLEPHRINE HCL 10 MG/ML IJ SOLN
INTRAMUSCULAR | Status: DC | PRN
  Administered 2016-10-27 (×2): 80 ug via INTRAVENOUS

## 2016-10-27 MED ORDER — ACETAMINOPHEN 325 MG PO TABS: 650.0000 mg | ORAL_TABLET | ORAL | Status: DC | PRN

## 2016-10-27 MED ORDER — MIDAZOLAM HCL 5 MG/5ML IJ SOLN
INTRAMUSCULAR | Status: DC | PRN
  Administered 2016-10-27: 1 mg via INTRAVENOUS

## 2016-10-27 MED ORDER — BUPIVACAINE HCL (PF) 0.25 % IJ SOLN
INTRAMUSCULAR | Status: AC
  Filled 2016-10-27: qty 60

## 2016-10-27 MED ORDER — DIGOXIN 250 MCG PO TABS
250.0000 ug | ORAL_TABLET | Freq: Every day | ORAL | Status: DC
  Administered 2016-10-28: 250 ug via ORAL
  Filled 2016-10-27: qty 1

## 2016-10-27 MED ORDER — SODIUM CHLORIDE 0.9% FLUSH: 3.0000 mL | INTRAVENOUS | Status: DC | PRN

## 2016-10-27 MED ORDER — VITAMIN B-12 1000 MCG PO TABS
1000.0000 ug | ORAL_TABLET | Freq: Every day | ORAL | Status: DC
  Administered 2016-10-28: 1000 ug via ORAL
  Filled 2016-10-27: qty 1

## 2016-10-27 MED ORDER — APIXABAN 5 MG PO TABS
5.0000 mg | ORAL_TABLET | Freq: Two times a day (BID) | ORAL | Status: DC
  Administered 2016-10-27 – 2016-10-28 (×2): 5 mg via ORAL
  Filled 2016-10-27 (×2): qty 1

## 2016-10-27 MED ORDER — HEPARIN (PORCINE) IN NACL 2-0.9 UNIT/ML-% IJ SOLN
INTRAMUSCULAR | Status: AC
  Filled 2016-10-27: qty 500

## 2016-10-27 MED ORDER — BUPIVACAINE HCL (PF) 0.25 % IJ SOLN
INTRAMUSCULAR | Status: DC | PRN
  Administered 2016-10-27: 45 mL

## 2016-10-27 MED ORDER — ONDANSETRON HCL 4 MG/2ML IJ SOLN
INTRAMUSCULAR | Status: DC | PRN
  Administered 2016-10-27: 4 mg via INTRAVENOUS

## 2016-10-27 MED ORDER — HEPARIN (PORCINE) IN NACL 2-0.9 UNIT/ML-% IJ SOLN
INTRAMUSCULAR | Status: DC | PRN
  Administered 2016-10-27: 09:00:00

## 2016-10-27 MED ORDER — SODIUM CHLORIDE 0.9 % IV SOLN
INTRAVENOUS | Status: DC
  Administered 2016-10-27: 50 mL/h via INTRAVENOUS
  Administered 2016-10-28: 03:00:00 via INTRAVENOUS

## 2016-10-27 MED ORDER — TIOTROPIUM BROMIDE MONOHYDRATE 18 MCG IN CAPS: 18.0000 ug | ORAL_CAPSULE | Freq: Every day | RESPIRATORY_TRACT | Status: DC

## 2016-10-27 MED ORDER — PROPOFOL 10 MG/ML IV BOLUS
INTRAVENOUS | Status: DC | PRN
Start: 2016-10-27 — End: 2016-10-27
  Administered 2016-10-27: 130 mg via INTRAVENOUS

## 2016-10-27 MED ORDER — TIOTROPIUM BROMIDE MONOHYDRATE 18 MCG IN CAPS
18.0000 ug | ORAL_CAPSULE | Freq: Every day | RESPIRATORY_TRACT | Status: DC
  Administered 2016-10-28: 18 ug via RESPIRATORY_TRACT
  Filled 2016-10-27: qty 5

## 2016-10-27 MED ORDER — FENTANYL CITRATE (PF) 100 MCG/2ML IJ SOLN
INTRAMUSCULAR | Status: DC | PRN
  Administered 2016-10-27 (×3): 50 ug via INTRAVENOUS

## 2016-10-27 MED ORDER — TIOTROPIUM BROMIDE MONOHYDRATE 18 MCG IN CAPS
18.0000 ug | ORAL_CAPSULE | Freq: Every day | RESPIRATORY_TRACT | Status: DC
  Filled 2016-10-27: qty 5

## 2016-10-27 MED ORDER — SODIUM CHLORIDE 0.9 % IV SOLN: 250.0000 mL | INTRAVENOUS | Status: DC | PRN

## 2016-10-27 MED ORDER — SODIUM CHLORIDE 0.9% FLUSH
3.0000 mL | Freq: Two times a day (BID) | INTRAVENOUS | Status: DC
  Administered 2016-10-28: 3 mL via INTRAVENOUS

## 2016-10-27 SURGICAL SUPPLY — 13 items
BAG SNAP BAND KOVER 36X36 (MISCELLANEOUS) ×3 IMPLANT
CATH BLAZERPRIME XP LG CV 10MM (ABLATOR) ×3 IMPLANT
CATH DUODECA/ISMUS 7FR REPROC (CATHETERS) ×3 IMPLANT
CATH OCTAPOLOR 6F 125CM 2-5-2 (CATHETERS) ×3 IMPLANT
CATH QUAD COURNAND 5FR REPROC (CATHETERS) ×3 IMPLANT
PACK EP LATEX FREE (CUSTOM PROCEDURE TRAY) ×2
PACK EP LF (CUSTOM PROCEDURE TRAY) ×1 IMPLANT
PAD DEFIB LIFELINK (PAD) ×3 IMPLANT
SHEATH ATRIAL FLUTTER SAFL 8F (SHEATH) ×3 IMPLANT
SHEATH PINNACLE 6F 10CM (SHEATH) ×3 IMPLANT
SHEATH PINNACLE 7F 10CM (SHEATH) ×3 IMPLANT
SHEATH PINNACLE 8F 10CM (SHEATH) ×6 IMPLANT
SHIELD RADPAD SCOOP 12X17 (MISCELLANEOUS) ×3 IMPLANT

## 2016-10-27 NOTE — Progress Notes (Signed)
Discussed the IMMATURE BLOOD CELLS W Dr Jana Hakim  We have odered a PCR to clarify the presence or absence of CML vs inflammatory reaction He will get an appt from [facility] in 3 weeks or so to review the results

## 2016-10-27 NOTE — Progress Notes (Signed)
Site area: rt groin Site Prior to Removal:  Level 0 Pressure Applied For: 15 minutes Manual:   yes Patient Status During Pull:  stable Post Pull Site:  Level 0 Post Pull Instructions Given:  yes Post Pull Pulses Present: yes Dressing Applied:  tegaderm/gauze Bedrest begins @  Comments:

## 2016-10-27 NOTE — Discharge Instructions (Signed)
No driving for 3 days. No lifting over 5 lbs for 1 week. No sexual activity for 1 week. Keep procedure site clean & dry. If you notice increased pain, swelling, bleeding or pus, call/return! You may shower, but no soaking baths/hot tubs/pools for 1 week. ° °

## 2016-10-27 NOTE — Progress Notes (Signed)
Site area: left groin Site Prior to Removal:  Level 0  Pressure Applied For: 15 minutes Manual:   yes Patient Status During Pull:  stable Post Pull Site:  Level  0 Post Pull Instructions Given:  yes Post Pull Pulses Present: yes Dressing Applied:  tegaderm Bedrest begins @  0174 Comments:

## 2016-10-27 NOTE — H&P (Signed)
      Patient Care Team: [name], MD as PCP - General (Cardiology) [name], MD as Consulting Physician (Cardiology)   HPI  [name] is a 70 y.o. male Admitted for ablation of f,lutter, presumed typical and reverse typical Rx initially with amio and apixoban   Echo 11/178  Normal EF and normal LA size     Records and Results Reviewed  Cr 1.98 **  Past Medical History:  Diagnosis Date  . Atrial flutter ([place])   . COPD (chronic obstructive pulmonary disease) ([place])   . Diabetes mellitus without complication ([place])   . Hyperlipidemia   . Hypertension   . Polycythemia     Past Surgical History:  Procedure Laterality Date  . TEE WITH CARDIOVERSION  12/16    No current facility-administered medications for this encounter.     Allergies  Allergen Reactions  . Levaquin [Levofloxacin In D5w] Rash      Social History  Substance Use Topics  . Smoking status: Former Smoker    Packs/day: 1.50    Years: 45.00    Types: Cigarettes    Quit date: 11/27/2012  . Smokeless tobacco: Never Used  . Alcohol use No     Family History  Problem Relation Age of Onset  . Family history unknown: Yes    No current facility-administered medications on file prior to encounter.    Current Outpatient Prescriptions on File Prior to Encounter  Medication Sig Dispense Refill  . budesonide-formoterol (SYMBICORT) 80-4.5 MCG/ACT inhaler Inhale 1 puff into the lungs 2 times daily at 12 noon and 4 pm.    . Cyanocobalamin 1000 MCG TBCR Take 1,000 mcg by mouth daily.    Marland Kitchen diltiazem (CARTIA XT) 180 MG 24 hr capsule Take 1 capsule (180 mg total) by mouth 2 (two) times daily. 60 capsule 3  . ELIQUIS 5 MG TABS tablet TAKE ONE TABLET BY MOUTH TWICE DAILY 60 tablet 3  . glucose blood (FREESTYLE LITE) test strip Check blood sugar 2x a day as directed    . metFORMIN (GLUCOPHAGE) 1000 MG tablet Take 1 tablet (1,000 mg total) by mouth 2 (two) times daily with a meal. 180 tablet  0  . metoprolol succinate (TOPROL-XL) 50 MG 24 hr tablet TAKE ONE TABLET BY MOUTH ONCE DAILY WITH OR IMMEDIATELY FOLLOWING A MEAL. 30 tablet 3  . rosuvastatin (CRESTOR) 40 MG tablet Take 1 tablet (40 mg total) by mouth daily. 30 tablet 11  . tiotropium (SPIRIVA) 18 MCG inhalation capsule Place 1 puff into inhaler and inhale daily.       Review of Systems negative except from HPI and PMH  Physical Exam BP 132/68   Pulse 61   Temp 97.6 F (36.4 C) (Oral)   Resp 18   Ht 5\' 7"  (1.702 m)   Wt 190 lb (86.2 kg)   SpO2 99%   BMI 29.76 kg/m  Well developed and well nourished in no acute distress HENT normal E scleral and icterus clear Neck Supple JVP flat; carotids brisk and full Clear to ausculation  Regular rate and rhythm, no murmurs gallops or rub Soft with active bowel sounds No clubbing cyanosis  Edema Alert and oriented, grossly normal motor and sensory function Skin Warm and Dry    Assessment and  Plan  Atrial flutter   For ablation today Procedure reviewed and risks reitrerated  Took apixoban  This am   eCG pending

## 2016-10-27 NOTE — Anesthesia Procedure Notes (Signed)
Procedure Name: LMA Insertion Date/Time: 10/27/2016 8:45 AM Performed by: Greggory Stallion, Perry Brucato L Pre-anesthesia Checklist: Patient identified, Emergency Drugs available, Suction available and Patient being monitored Patient Re-evaluated:Patient Re-evaluated prior to inductionOxygen Delivery Method: Circle System Utilized Preoxygenation: Pre-oxygenation with 100% oxygen Intubation Type: IV induction Ventilation: Mask ventilation without difficulty LMA: LMA inserted LMA Size: 5.0 Number of attempts: 1 Placement Confirmation: positive ETCO2 Tube secured with: Tape Dental Injury: Teeth and Oropharynx as per pre-operative assessment

## 2016-10-27 NOTE — Progress Notes (Signed)
Pt ambulated down the hall, desated to low 60s. Upon returning to room O2 sats 90-93%. Stated he feels much better after ambulating. Old drainage noted on left groin site.  No sign of new bleeding noted. Pt sitting on side of bed.

## 2016-10-27 NOTE — Anesthesia Postprocedure Evaluation (Signed)
Anesthesia Post Note  Patient: [name]  Procedure(s) Performed: Procedure(s) (LRB): A-Flutter Ablation (N/A)  Patient location during evaluation: PACU Anesthesia Type: General Level of consciousness: awake and alert Pain management: pain level controlled Vital Signs Assessment: post-procedure vital signs reviewed and stable Respiratory status: spontaneous breathing, nonlabored ventilation and respiratory function stable Cardiovascular status: blood pressure returned to baseline and stable Postop Assessment: no signs of nausea or vomiting Anesthetic complications: no    Last Vitals:  Vitals:   10/27/16 1120 10/27/16 1142  BP: (!) 102/48 (!) 109/58  Pulse: (!) 45   Resp: 14   Temp:  36.6 C    Last Pain:  Vitals:   10/27/16 1142  TempSrc: Oral                 Frimet Durfee,W. EDMOND

## 2016-10-27 NOTE — Discharge Summary (Signed)
ELECTROPHYSIOLOGY PROCEDURE DISCHARGE SUMMARY    [name],  MRN: [id number], DOB/AGE: 03/29/1946 70 y.o.  Admit date: 10/27/2016 Discharge date: 10/28/16  Primary Care Physician: [name], MD Primary Cardiologist: Rockey Situ Electrophysiologist: Caryl Comes  Primary Discharge Diagnosis:  1. Atrial flutter status post ablation this admission 2. Incidental finding myelocytes, 1% promyelocytes      Hematology will call patient to follow up with them in January  Secondary Discharge Diagnosis:  1.  COPD 2.  Diabetes 3.  Hypertension 4.  Hyperlipidemia  Allergies  Allergen Reactions  . Levaquin [Levofloxacin In D5w] Rash     Procedures This Admission: 1.  Electrophysiology study and radiofrequency catheter ablation on 10/27/16 by Dr Caryl Comes.  This study demonstrated typical atrial flutter with successful CTI ablation.  There were no inducible arrhythmias following ablation and no early apparent complications.   Brief HPI: Arik Husmann is a 70 y.o. male with a past medical history as outlined above.  He has documented atrial flutter. He has been appropriately anticoagulated for >4 weeks.  Risks, benefits, and alternatives to ablation were reviewed with the patient who wished to proceed.   Hospital Course:  The patient was admitted and underwent EPS/RFCA with details as outlined above. He was monitored on telemetry overnight which demonstrated initially junctional 30's-40's, he has regained SR 70's occ PAC's.  Groin incision was without complication.  We will stop his diltiazem and reduce his BB in 1/2.  They were examined by Dr Caryl Comes who considered them stable for discharge to home.  Follow up will be arranged in 4 weeks.  Wound care and restrictions were reviewed with the patient prior to discharge.   Physical Exam: Vitals:   10/27/16 2224 10/28/16 0516 10/28/16 0827 10/28/16 0853  BP: (!) 131/50 (!) 145/69    Pulse: (!) 42 77  72  Resp: (!) 23  20    Temp: 98.6 F (37 C) 97.8 F (36.6 C)    TempSrc: Oral Oral    SpO2: 94% 99% 92%   Weight:  182 lb 8 oz (82.8 kg)    Height:        GEN- The patient is well appearing, alert and oriented x 3 today.   HEENT: normocephalic, atraumatic; sclera clear, conjunctiva pink; hearing intact; oropharynx clear; neck supple, no JVP Lymph- no cervical lymphadenopathy Lungs- Clear to ausculation bilaterally, normal work of breathing.  No wheezes, rales, rhonchi Heart- Regular rate and rhythm, no murmurs, rubs or gallops, PMI is not displaced GI- soft, non-tender, non-distended Extremities- no clubbing, cyanosis, or edema; DP/PT/radial pulses 2+ bilaterally, groin without hematoma/bruit MS- no significant deformity or atrophy Skin- warm and dry, no rash or lesion Psych- euthymic mood, full affect Neuro- strength and sensation are intact   Labs:   Lab Results  Component Value Date   WBC 10.4 10/25/2016   HGB 17.8 11/04/2014   HCT 47.5 10/25/2016   MCV 97 10/25/2016   PLT 207 10/25/2016     Recent Labs Lab 10/28/16 0316  NA 136  K 4.6  CL 103  CO2 27  BUN 31*  CREATININE 2.27*  CALCIUM 8.5*  GLUCOSE 141*    Discharge Medications:    Medication List    STOP taking these medications   diltiazem 180 MG 24 hr capsule Commonly known as:  CARTIA XT     TAKE these medications   budesonide-formoterol 80-4.5 MCG/ACT inhaler Commonly known as:  SYMBICORT Inhale 1 puff into the lungs 2 times  daily at 12 noon and 4 pm.   Cyanocobalamin 1000 MCG Tbcr Take 1,000 mcg by mouth daily.   digoxin 0.25 MG tablet Commonly known as:  LANOXIN TAKE ONE TABLET BY MOUTH ONCE DAILY   ELIQUIS 5 MG Tabs tablet Generic drug:  apixaban TAKE ONE TABLET BY MOUTH TWICE DAILY   FREESTYLE LITE test strip Generic drug:  glucose blood Check blood sugar 2x a day as directed   metFORMIN 1000 MG tablet Commonly known as:  GLUCOPHAGE Take 1 tablet (1,000 mg total) by mouth 2 (two) times daily  with a meal.   metoprolol succinate 25 MG 24 hr tablet Commonly known as:  TOPROL XL Take 1 tablet (25 mg total) by mouth daily. What changed:  See the new instructions.   rosuvastatin 40 MG tablet Commonly known as:  CRESTOR Take 1 tablet (40 mg total) by mouth daily.   tiotropium 18 MCG inhalation capsule Commonly known as:  SPIRIVA Place 1 puff into inhaler and inhale daily.       Disposition:  Discharge Instructions    Diet - low sodium heart healthy    Complete by:  As directed    Increase activity slowly    Complete by:  As directed      Follow-up Information    Virl Axe, MD Follow up on 11/25/2016.   Specialty:  Cardiology Why:  at 9:45AM Contact information: [place] [address] [phone]           Duration of Discharge Encounter: Greater than 30 minutes including physician time.  Venetia Night, PA-C 10/28/2016 9:17 AM

## 2016-10-27 NOTE — Progress Notes (Signed)
Pt had several pauses that were less than 3 secs. Pt is asymptomatic, will continue to monitor. HR 53-60.

## 2016-10-27 NOTE — Transfer of Care (Signed)
Immediate Anesthesia Transfer of Care Note  Patient: [name]  Procedure(s) Performed: Procedure(s): A-Flutter Ablation (N/A)  Patient Location: PACU and Cath Lab  Anesthesia Type:General  Level of Consciousness: awake, alert , oriented and patient cooperative  Airway & Oxygen Therapy: Patient Spontanous Breathing  Post-op Assessment: Report given to RN, Post -op Vital signs reviewed and stable and Patient moving all extremities  Post vital signs: Reviewed and stable  Last Vitals:  Vitals:   10/27/16 0638 10/27/16 1030  BP: 132/68   Pulse: 61   Resp: 18   Temp: 36.4 C 36.3 C    Last Pain:  Vitals:   10/27/16 1030  TempSrc: Temporal         Complications: No apparent anesthesia complications

## 2016-10-27 NOTE — Progress Notes (Signed)
Patient HR maintaining 34-39 SB, BP 105/56, patient asymptomatic. Called and spoke with Chanetta Marshall, NP and updated. She stated patient was SB during case and to continue to monitor, IF patient goes any lower or becomes symptomatic to page Dr. Caryl Comes. Will continue to monitor.

## 2016-10-27 NOTE — Progress Notes (Unsigned)
I was called by Jolyn Nap regarding Mr. Swamy who was noted to have some myelocytes and 1% promyelocytes in his circulating blood, with otherwise normal counts including no anemia, no thrombocytopenia, and a total white cell count of 10.4 with an ANC of 7.0.  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 We are obtaining a PCR.ABL probe and I will see the patient early January for follow-up. I will check on the test results prior to the visit to make sure that it is available at that time

## 2016-10-28 ENCOUNTER — Encounter: Payer: Self-pay | Admitting: Oncology

## 2016-10-28 DIAGNOSIS — J449 Chronic obstructive pulmonary disease, unspecified: Secondary | ICD-10-CM | POA: Diagnosis not present

## 2016-10-28 DIAGNOSIS — Z7901 Long term (current) use of anticoagulants: Secondary | ICD-10-CM | POA: Diagnosis not present

## 2016-10-28 DIAGNOSIS — Z87891 Personal history of nicotine dependence: Secondary | ICD-10-CM | POA: Diagnosis not present

## 2016-10-28 DIAGNOSIS — E119 Type 2 diabetes mellitus without complications: Secondary | ICD-10-CM | POA: Diagnosis not present

## 2016-10-28 DIAGNOSIS — Z7984 Long term (current) use of oral hypoglycemic drugs: Secondary | ICD-10-CM | POA: Diagnosis not present

## 2016-10-28 DIAGNOSIS — I4892 Unspecified atrial flutter: Secondary | ICD-10-CM | POA: Diagnosis not present

## 2016-10-28 DIAGNOSIS — I10 Essential (primary) hypertension: Secondary | ICD-10-CM | POA: Diagnosis not present

## 2016-10-28 DIAGNOSIS — D751 Secondary polycythemia: Secondary | ICD-10-CM | POA: Diagnosis not present

## 2016-10-28 DIAGNOSIS — E785 Hyperlipidemia, unspecified: Secondary | ICD-10-CM | POA: Diagnosis not present

## 2016-10-28 LAB — BASIC METABOLIC PANEL
Anion gap: 6 (ref 5–15)
BUN: 31 mg/dL — AB (ref 6–20)
CALCIUM: 8.5 mg/dL — AB (ref 8.9–10.3)
CO2: 27 mmol/L (ref 22–32)
CREATININE: 2.27 mg/dL — AB (ref 0.61–1.24)
Chloride: 103 mmol/L (ref 101–111)
GFR, EST AFRICAN AMERICAN: 31 mL/min — AB (ref >60)
GFR, EST NON AFRICAN AMERICAN: 27 mL/min — AB (ref >60)
Glucose, Bld: 141 mg/dL — ABNORMAL HIGH (ref 65–99)
Potassium: 4.6 mmol/L (ref 3.5–5.1)
SODIUM: 136 mmol/L (ref 135–145)

## 2016-10-28 LAB — GLUCOSE, CAPILLARY: GLUCOSE-CAPILLARY: 129 mg/dL — AB (ref 65–99)

## 2016-10-28 MED ORDER — METOPROLOL SUCCINATE ER 25 MG PO TB24: 25.0000 mg | ORAL_TABLET | Freq: Every day | ORAL | 3 refills | Status: DC

## 2016-10-28 NOTE — Progress Notes (Signed)
Patient being discharged home per MD order. All discharge instructions given to patient in written format.

## 2016-10-28 NOTE — Op Note (Signed)
NAME:  [name], [name] NO.:  MEDICAL RECORD NO.:  [id number]  LOCATION:                                 FACILITY:  PHYSICIAN:  Deboraha Sprang, MD, [facility]   DATE OF BIRTH:  DATE OF PROCEDURE:  10/27/2016 DATE OF DISCHARGE:                              OPERATIVE REPORT   PREOPERATIVE DIAGNOSIS:  Atrial flutter.  POSTOPERATIVE DIAGNOSIS:  Atrial flutter; sinus bradycardia.  PROCEDURES:  Invasive electrophysiological study, arrhythmia mapping, and radiofrequency catheter ablation.  DESCRIPTION OF PROCEDURE:  Following obtaining informed consent, the patient was brought to electrophysiology laboratory and placed on the fluoroscopic table in supine position.  The patient was submitted to general anesthesia.  After routine prep and drape, Sensorcaine was used adjunctively and cardiac catheterization was performed.  Following the procedure, the catheter was removed, hemostasis was obtained. Following, the patient was transferred to the holding area.  Catheters; a 5-French quadripolar catheter was inserted in the left femoral vein to the AV junction A 6-French decapolar catheter inserted in the right femoral vein to the coronary sinus. A 7-French dual decapolar catheter was inserted in the left femoral vein to the tricuspid annulus. An 8-French 10 mm deflectable tip ablation catheter was inserted via the right femoral vein to mapping sites in the posterior septal space.  Surface leads; I, aVF, and V1 were monitored continuously throughout the procedure.  Following insertion of the catheters, a stimulation protocol included incremental atrial pacing. Incremental ventricular pacing. Burst atrial pacing.  RESULTS:  End-tidal surface electrocardiogram and basic intervals: 1. Initial rhythm, atrial flutter; RR interval 1084 milliseconds; AA     interval 270 millisecond; QRS duration 104 millisecond; QT interval     392 millisecond; HV interval 51  milliseconds. 2. Final rhythm:  Sinus; RR interval 1448 milliseconds; PR interval     188 millisecond; P-wave duration 137 millisecond; QRS duration 127     millisecond; QT interval 400 milliseconds; AH interval 127     milliseconds; HV interval 56 milliseconds. 3. AV Wenckebach was 650 milliseconds. 4. AV conduction was dissociated with ventricular pacing, i.e., VA     dissociation. 5. AV nodal effective refractory period at 700 milliseconds was 540     milliseconds without evidence of discontinuity. Accessory pathway:  No evidence of accessory pathway was identified.  Ventricular response programmed stimulation was normal for ventricular stimulation as described.  Arrhythmias:  The patient presented with atrial flutter with electrogram mapping confirming counterclockwise rotation.  Catheter ablation across the cavotricuspid isthmus interrupted the patient's flutter and resulted in bidirectional isthmus block.  Stim A2 interval was 190 milliseconds.  Radiofrequency energy:  A total of 11.5 minutes of RF energy was applied across the cavotricuspid isthmus.  Fluoroscopy time was 9.1 minutes.  IMPRESSION: 1. Sinus bradycardia with postablation intervals in the 1400 range,     i.e., heart rate is low 40s. 2. Abnormal atrial function manifested by atrial flutter. 3. Normal AV nodal function apart from prolonged AV nodal Wenckebach     cycle length of 650 milliseconds. 4. No accessory pathway. 5. Normal His-Purkinje system function. 6. Normal ventricular response to programmed stimulation.  SUMMARY AND CONCLUSION:  Results of electrophysiological testing confirmed cavotricuspid isthmus dependent flutter and catheter ablation successfully interrupted flutter resulted in bidirectional isthmus conduction block.  The patient had significant postprocedural sinus bradycardia which is likely aggravated by the rate-controlling medications.  They will be discontinued.  The patient will be  continued on apixaban.     Deboraha Sprang, MD, [facility]     SCK/MEDQ  D:  10/27/2016  T:  10/28/2016  Job:  [id number]

## 2016-11-02 ENCOUNTER — Telehealth: Payer: Self-pay | Admitting: Internal Medicine

## 2016-11-02 NOTE — Telephone Encounter (Signed)
Pt calling asking if he can do his labs that Dr Caryl Comes ordered to do some labs at the cancer center in [place]  Pt would like to know if he can do that here in [facility]  Please advise

## 2016-11-03 ENCOUNTER — Telehealth: Payer: Self-pay | Admitting: Internal Medicine

## 2016-11-03 ENCOUNTER — Encounter: Payer: Self-pay | Admitting: Internal Medicine

## 2016-11-03 LAB — BCR-ABL1, CML/ALL, PCR, QUANT

## 2016-11-03 NOTE — Telephone Encounter (Signed)
Spoke to [place]- regarding this patient with incidental finding of "1% immature cells"- as per Dr. Curlene Labrum- bcr-abl is pending; patient most follow-up at [facility]. Please have the patient follow-up/next available doc in appx  2 weeks.

## 2016-11-03 NOTE — Progress Notes (Unsigned)
Spoke with Onc center- [place] to see if they can see him as pt lives in [place].  and they will see him

## 2016-11-05 ENCOUNTER — Telehealth: Payer: Self-pay | Admitting: Internal Medicine

## 2016-11-05 NOTE — Telephone Encounter (Signed)
Called Ebony Hail at [facility]. Informed her that patient should be taking metoprolol 25 mg by mouth daily, not 50 mg. This was decreased while in the hospital and was discharged with a new prescription to take Metoprolol 25 mg by mouth daily, due to bradycardia. Ebony Hail verbalized understanding and will make sure prescription is filled as directed. Called patient to make sure he was taking his medications as prescribed. Patient verbalized understanding, and repeated back correct dosage.

## 2016-11-05 NOTE — Telephone Encounter (Signed)
Ebony Hail from [facility] is calling to get clarification for Metoprolol. She wants to know if it is being changed or adding to what he is already on.Please call. Thanks.

## 2016-11-10 DIAGNOSIS — J449 Chronic obstructive pulmonary disease, unspecified: Secondary | ICD-10-CM | POA: Diagnosis not present

## 2016-11-18 ENCOUNTER — Ambulatory Visit: Payer: PPO | Admitting: Oncology

## 2016-11-18 ENCOUNTER — Other Ambulatory Visit: Payer: Self-pay | Admitting: *Deleted

## 2016-11-18 ENCOUNTER — Telehealth: Payer: Self-pay | Admitting: Oncology

## 2016-11-18 DIAGNOSIS — D72829 Elevated white blood cell count, unspecified: Secondary | ICD-10-CM

## 2016-11-18 NOTE — Telephone Encounter (Signed)
Pt called to cancel his appointment for 11/19/16 @ 8:45 needs to reschedule for next week

## 2016-11-19 ENCOUNTER — Other Ambulatory Visit: Payer: PPO

## 2016-11-19 ENCOUNTER — Ambulatory Visit: Payer: PPO | Admitting: Oncology

## 2016-11-19 DIAGNOSIS — D751 Secondary polycythemia: Secondary | ICD-10-CM | POA: Insufficient documentation

## 2016-11-19 DIAGNOSIS — D72829 Elevated white blood cell count, unspecified: Secondary | ICD-10-CM | POA: Insufficient documentation

## 2016-11-20 ENCOUNTER — Encounter: Payer: Self-pay | Admitting: Oncology

## 2016-11-20 NOTE — Progress Notes (Signed)
No show

## 2016-11-25 ENCOUNTER — Ambulatory Visit (INDEPENDENT_AMBULATORY_CARE_PROVIDER_SITE_OTHER): Payer: PPO | Admitting: Internal Medicine

## 2016-11-25 ENCOUNTER — Encounter: Payer: Self-pay | Admitting: Internal Medicine

## 2016-11-25 VITALS — BP 130/62 | HR 99 | Ht 68.0 in | Wt 188.5 lb

## 2016-11-25 DIAGNOSIS — I483 Typical atrial flutter: Secondary | ICD-10-CM | POA: Diagnosis not present

## 2016-11-25 MED ORDER — METOPROLOL SUCCINATE ER 50 MG PO TB24: 50.0000 mg | ORAL_TABLET | Freq: Every day | ORAL | 3 refills | Status: DC

## 2016-11-25 NOTE — Telephone Encounter (Signed)
The patient saw Dr. Caryl Comes in clinic today for followup. Dr. Caryl Comes spoke with the [place] at [facility] and the patient is scheduled to see Dr. Janese Banks- 12/03/16 @ 2pm. The patient voices understanding.  He was given the contact # for the [place] [phone].

## 2016-11-25 NOTE — Patient Instructions (Signed)
Medication Instructions: - Your physician has recommended you make the following change in your medication:  1) Stop Eliquis 2) Stop Digoxin 3) Increase metoprolol succinate (toprol) to 50 mg- take one tablet by mouth once daily  Labwork: - Your physician recommends that you have lab work today: TSH/ CBC   Procedures/Testing: - none ordered  Follow-Up: - You have been referred to : Dr. Janese Banks at the [facility]- Friday 12/03/16 at 2:00pm.  [phone]  - Dr. Caryl Comes will see you back on an as needed basis.  Any Additional Special Instructions Will Be Listed Below (If Applicable).     If you need a refill on your cardiac medications before your next appointment, please call your pharmacy.

## 2016-11-25 NOTE — Progress Notes (Signed)
Patient Care Team: Carlos Athens, MD as PCP - General (Cardiology) [name], MD as Consulting Physician (Cardiology)   HPI  [name] is a 71 y.o. male Seen in followup of atrial flutter presumed typical and reverse typical    He underwent catheter ablation 10/17.  On apixoban ; no bleeding issues   Echo 11/17  Normal LV function with normal LA size   TERF  Age-42, DM-1 HTN-1  CHADSVASc 3    Noted to have immature WBC at ablation and referred to hematology-- tests were  Neg but pt was no show per EPIC  this was secondary to weather. Appointment for him [place] was canceled because of "error"    No palps  No CP or SOB  Records and Results Reviewed outpatient records and prior ECGs  1/17 Cr 0.9 K4.1   Past Medical History:  Diagnosis Date  . Atrial flutter ([place])   . COPD (chronic obstructive pulmonary disease) ([place])   . Diabetes mellitus without complication ([place])   . Hyperlipidemia   . Hypertension   . Polycythemia     Past Surgical History:  Procedure Laterality Date  . ELECTROPHYSIOLOGIC STUDY N/A 10/27/2016   Procedure: A-Flutter Ablation;  Surgeon: Deboraha Sprang, MD;  Location: [facility];  Service: Cardiovascular;  Laterality: N/A;  . TEE WITH CARDIOVERSION  12/16    Current Outpatient Prescriptions  Medication Sig Dispense Refill  . budesonide-formoterol (SYMBICORT) 80-4.5 MCG/ACT inhaler Inhale 1 puff into the lungs 2 times daily at 12 noon and 4 pm.    . Cyanocobalamin 1000 MCG TBCR Take 1,000 mcg by mouth daily.    . digoxin (LANOXIN) 0.25 MG tablet TAKE ONE TABLET BY MOUTH ONCE DAILY 90 tablet 3  . ELIQUIS 5 MG TABS tablet TAKE ONE TABLET BY MOUTH TWICE DAILY 60 tablet 3  . glucose blood (FREESTYLE LITE) test strip Check blood sugar 2x a day as directed    . metFORMIN (GLUCOPHAGE) 1000 MG tablet Take 1 tablet (1,000 mg total) by mouth 2 (two) times daily with a meal. 180 tablet 0  . metoprolol succinate (TOPROL XL) 25 MG  24 hr tablet Take 1 tablet (25 mg total) by mouth daily. 30 tablet 3  . rosuvastatin (CRESTOR) 40 MG tablet Take 1 tablet (40 mg total) by mouth daily. 30 tablet 11  . tiotropium (SPIRIVA) 18 MCG inhalation capsule Place 1 puff into inhaler and inhale daily.     No current facility-administered medications for this visit.     Allergies  Allergen Reactions  . Levaquin [Levofloxacin In D5w] Rash      Review of Systems negative except from HPI and PMH  Physical Exam BP 130/62 (BP Location: Left Arm, Patient Position: Sitting, Cuff Size: Normal)   Pulse 99   Ht 5\' 8"  (1.727 m)   Wt 188 lb 8 oz (85.5 kg)   BMI 28.66 kg/m  Well developed and well nourished in no acute distress HENT normal E scleral and icterus clear Neck Supple JVP flat; carotids brisk and full Clear to ausculation Reg Regular rate and rhythm, no murmurs gallops or rub Soft with active bowel sounds No clubbing cyanosis  Edema Alert and oriented, grossly normal motor and sensory function Skin Warm and Dry  ECG demonstrates sinus at 99 PACs  Assessment and  Plan Atrial flutter s/p ablation  Immature WBC  Sinus Tachy  The patient has had no recurrent atrial arrhythmia. He does MG PACs. Atrial fibrillation  may well be in his future. For now however, we will discontinue anticoagulation;  discontinue digoxin. With tachycardia we will increase his metoprolol little bit from 25--50.  We will recheck a CBC today as well as check a TSH which was last done 5/17 in Care Everywhere. It was normal  I have spoken with the hematology office to reset an appointment to review his tests.  Current medicines are reviewed at length with the patient today .  The patient does not  have concerns regarding medicines.

## 2016-11-26 LAB — CBC WITH DIFFERENTIAL/PLATELET
BASOS ABS: 0.1 10*3/uL (ref 0.0–0.2)
Basos: 1 %
EOS (ABSOLUTE): 0.3 10*3/uL (ref 0.0–0.4)
Eos: 3 %
Hematocrit: 46.3 % (ref 37.5–51.0)
Hemoglobin: 16.1 g/dL (ref 13.0–17.7)
Immature Grans (Abs): 0.4 10*3/uL — ABNORMAL HIGH (ref 0.0–0.1)
Immature Granulocytes: 5 %
LYMPHS ABS: 1.3 10*3/uL (ref 0.7–3.1)
Lymphs: 16 %
MCH: 33.5 pg — AB (ref 26.6–33.0)
MCHC: 34.8 g/dL (ref 31.5–35.7)
MCV: 97 fL (ref 79–97)
MONOCYTES: 12 %
Monocytes Absolute: 0.9 10*3/uL (ref 0.1–0.9)
NEUTROS PCT: 63 %
Neutrophils Absolute: 5.2 10*3/uL (ref 1.4–7.0)
PLATELETS: 161 10*3/uL (ref 150–379)
RBC: 4.8 x10E6/uL (ref 4.14–5.80)
RDW: 12.1 % — ABNORMAL LOW (ref 12.3–15.4)
WBC: 8.2 10*3/uL (ref 3.4–10.8)

## 2016-11-26 LAB — TSH: TSH: 2.8 u[IU]/mL (ref 0.450–4.500)

## 2016-11-30 ENCOUNTER — Telehealth: Payer: Self-pay

## 2016-11-30 NOTE — Telephone Encounter (Signed)
Pt is aware and agreeable to normal labs

## 2016-12-02 ENCOUNTER — Other Ambulatory Visit: Payer: Self-pay | Admitting: Oncology

## 2016-12-03 ENCOUNTER — Other Ambulatory Visit: Payer: Self-pay | Admitting: *Deleted

## 2016-12-03 ENCOUNTER — Inpatient Hospital Stay: Payer: PPO | Attending: Oncology | Admitting: Oncology

## 2016-12-03 ENCOUNTER — Encounter: Payer: Self-pay | Admitting: Oncology

## 2016-12-03 ENCOUNTER — Inpatient Hospital Stay: Payer: PPO

## 2016-12-03 VITALS — BP 115/72 | HR 73 | Temp 98.6°F | Resp 18 | Ht 68.0 in | Wt 191.6 lb

## 2016-12-03 DIAGNOSIS — I2510 Atherosclerotic heart disease of native coronary artery without angina pectoris: Secondary | ICD-10-CM | POA: Diagnosis not present

## 2016-12-03 DIAGNOSIS — E1165 Type 2 diabetes mellitus with hyperglycemia: Secondary | ICD-10-CM | POA: Diagnosis not present

## 2016-12-03 DIAGNOSIS — E785 Hyperlipidemia, unspecified: Secondary | ICD-10-CM | POA: Insufficient documentation

## 2016-12-03 DIAGNOSIS — D72829 Elevated white blood cell count, unspecified: Secondary | ICD-10-CM

## 2016-12-03 DIAGNOSIS — J449 Chronic obstructive pulmonary disease, unspecified: Secondary | ICD-10-CM | POA: Diagnosis not present

## 2016-12-03 DIAGNOSIS — Z23 Encounter for immunization: Secondary | ICD-10-CM | POA: Insufficient documentation

## 2016-12-03 DIAGNOSIS — G8929 Other chronic pain: Secondary | ICD-10-CM

## 2016-12-03 DIAGNOSIS — M549 Dorsalgia, unspecified: Secondary | ICD-10-CM

## 2016-12-03 DIAGNOSIS — Z87891 Personal history of nicotine dependence: Secondary | ICD-10-CM | POA: Insufficient documentation

## 2016-12-03 DIAGNOSIS — E669 Obesity, unspecified: Secondary | ICD-10-CM | POA: Insufficient documentation

## 2016-12-03 DIAGNOSIS — Z79899 Other long term (current) drug therapy: Secondary | ICD-10-CM | POA: Diagnosis not present

## 2016-12-03 DIAGNOSIS — I483 Typical atrial flutter: Secondary | ICD-10-CM | POA: Diagnosis not present

## 2016-12-03 DIAGNOSIS — D751 Secondary polycythemia: Secondary | ICD-10-CM | POA: Diagnosis not present

## 2016-12-03 DIAGNOSIS — D72821 Monocytosis (symptomatic): Secondary | ICD-10-CM | POA: Insufficient documentation

## 2016-12-03 DIAGNOSIS — I10 Essential (primary) hypertension: Secondary | ICD-10-CM | POA: Diagnosis not present

## 2016-12-03 DIAGNOSIS — Z7984 Long term (current) use of oral hypoglycemic drugs: Secondary | ICD-10-CM | POA: Diagnosis not present

## 2016-12-03 LAB — CBC
HEMATOCRIT: 46.9 % (ref 40.0–52.0)
HEMOGLOBIN: 16.2 g/dL (ref 13.0–18.0)
MCH: 33 pg (ref 26.0–34.0)
MCHC: 34.6 g/dL (ref 32.0–36.0)
MCV: 95.3 fL (ref 80.0–100.0)
Platelets: 137 10*3/uL — ABNORMAL LOW (ref 150–440)
RBC: 4.92 MIL/uL (ref 4.40–5.90)
RDW: 11.6 % (ref 11.5–14.5)
WBC: 7.6 10*3/uL (ref 3.8–10.6)

## 2016-12-03 LAB — DIFFERENTIAL
BASOS ABS: 0 10*3/uL (ref 0–0.1)
BASOS PCT: 1 %
EOS ABS: 0.2 10*3/uL (ref 0–0.7)
Eosinophils Relative: 3 %
LYMPHS ABS: 1.3 10*3/uL (ref 1.0–3.6)
Lymphocytes Relative: 17 %
MONO ABS: 0.7 10*3/uL (ref 0.2–1.0)
Monocytes Relative: 9 %
Neutro Abs: 5.3 10*3/uL (ref 1.4–6.5)
Neutrophils Relative %: 70 %

## 2016-12-03 LAB — PATHOLOGIST SMEAR REVIEW

## 2016-12-03 MED ORDER — INFLUENZA VAC SPLIT QUAD 0.5 ML IM SUSY
0.5000 mL | PREFILLED_SYRINGE | Freq: Once | INTRAMUSCULAR | Status: AC
  Administered 2016-12-03: 0.5 mL via INTRAMUSCULAR
  Filled 2016-12-03: qty 0.5

## 2016-12-03 NOTE — Progress Notes (Deleted)
Hematology/Oncology Consult note [facility]  [phone] Fax:([phone]) [phone]  Patient Care Team: [name], MD as PCP - General (Cardiology) [name], MD as Consulting Physician (Cardiology)   Name of the patient: [name]  [id number]  05-21-1946   Date of visit: 12/03/16  Diagnosis- ***  Chief complaint/ Reason for visit- ***  Heme/Onc history: ***  Interval history- ***  ECOG PS- *** Pain scale- *** Opioid associated constipation- ***  Review of systems- ROS   Current treatment- ***  Allergies  Allergen Reactions  . Levaquin [Levofloxacin In D5w] Rash     Past Medical History:  Diagnosis Date  . Atrial flutter ([place])   . COPD (chronic obstructive pulmonary disease) ([place])   . Diabetes mellitus without complication ([place])   . Hyperlipidemia   . Hypertension   . Polycythemia      Past Surgical History:  Procedure Laterality Date  . ELECTROPHYSIOLOGIC STUDY N/A 10/27/2016   Procedure: A-Flutter Ablation;  Surgeon: Deboraha Sprang, MD;  Location: [facility];  Service: Cardiovascular;  Laterality: N/A;  . TEE WITH CARDIOVERSION  12/16    Social History   Social History  . Marital status: Single    Spouse name: N/A  . Number of children: N/A  . Years of education: N/A   Occupational History  . Not on file.   Social History Main Topics  . Smoking status: Former Smoker    Packs/day: 1.50    Years: 45.00    Types: Cigarettes    Quit date: 11/27/2012  . Smokeless tobacco: Never Used  . Alcohol use No  . Drug use: No  . Sexual activity: Not on file   Other Topics Concern  . Not on file   Social History Narrative  . No narrative on file    Family History  Problem Relation Age of Onset  . Family history unknown: Yes     Current Outpatient Prescriptions:  .  budesonide-formoterol (SYMBICORT) 80-4.5 MCG/ACT inhaler, Inhale 1 puff into the lungs 2 times daily at 12 noon and 4 pm.,  Disp: , Rfl:  .  Cyanocobalamin 1000 MCG TBCR, Take 1,000 mcg by mouth daily., Disp: , Rfl:  .  glucose blood (FREESTYLE LITE) test strip, Check blood sugar 2x a day as directed, Disp: , Rfl:  .  metFORMIN (GLUCOPHAGE) 1000 MG tablet, Take 1 tablet (1,000 mg total) by mouth 2 (two) times daily with a meal., Disp: 180 tablet, Rfl: 0 .  metoprolol succinate (TOPROL-XL) 50 MG 24 hr tablet, Take 1 tablet (50 mg total) by mouth daily. Take with or immediately following a meal., Disp: 90 tablet, Rfl: 3 .  rosuvastatin (CRESTOR) 40 MG tablet, Take 1 tablet (40 mg total) by mouth daily., Disp: 30 tablet, Rfl: 11 .  tiotropium (SPIRIVA) 18 MCG inhalation capsule, Place 1 puff into inhaler and inhale daily., Disp: , Rfl:   Physical exam: There were no vitals filed for this visit. Physical Exam   CMP Latest Ref Rng & Units 10/28/2016  Glucose 65 - 99 mg/dL 141(H)  BUN 6 - 20 mg/dL 31(H)  Creatinine 0.61 - 1.24 mg/dL 2.27(H)  Sodium 135 - 145 mmol/L 136  Potassium 3.5 - 5.1 mmol/L 4.6  Chloride 101 - 111 mmol/L 103  CO2 22 - 32 mmol/L 27  Calcium 8.9 - 10.3 mg/dL 8.5(L)  Total Protein 6.4 - 8.2 g/dL -  Total Bilirubin 0.2 - 1.0 mg/dL -  Alkaline Phos Unit/L -  AST 15 - 37 Unit/L -  ALT U/L -   CBC Latest Ref Rng & Units 11/25/2016  WBC 3.4 - 10.8 x10E3/uL 8.2  Hemoglobin 13.0 - 18.0 g/dL -  Hematocrit 37.5 - 51.0 % 46.3  Platelets 150 - 379 x10E3/uL 161    No images are attached to the encounter.  No results found.   Assessment and plan- Patient is a 71 y.o. male ***   Visit Diagnosis No diagnosis found.   Dr. Randa Evens, MD, MPH [place] at [facility] Pager- [phone] 12/03/2016 1:03 PM

## 2016-12-03 NOTE — Progress Notes (Signed)
Pt was told to come here because of his labs alittle elevated. He was in the hosp. And thought it would be a good idea to work it  Up.

## 2016-12-03 NOTE — Progress Notes (Signed)
Hematology/Oncology Consult note [facility] Telephone:([phone] [phone]  Patient Care Team: [name], MD as PCP - General (Cardiology) [name], MD as Consulting Physician (Cardiology)   Name of the patient: [name]  [id number]  1946/10/07    Reason for referral- immature cells in peripheral blood   Referring physician- Dr. Erick Alley  Date of visit: 12/03/16   History of presenting illness- Patient is a 71 yr old male who has not seen hematology in the past. CBC with diff on 10/25/16 showed immature cells in differential. There was some monocytosis with meylocytes and metamyelocytes seen. Hence patient has been referred for the same. Patient feels well and enies any complaints other than chronic back pain  ECOG PS- 1  Pain scale-3   Review of systems- Review of Systems  Constitutional: Negative for chills, fever, malaise/fatigue and weight loss.  HENT: Negative for congestion, ear discharge and nosebleeds.   Eyes: Negative for blurred vision.  Respiratory: Negative for cough, hemoptysis, sputum production, shortness of breath and wheezing.   Cardiovascular: Negative for chest pain, palpitations, orthopnea and claudication.  Gastrointestinal: Negative for abdominal pain, blood in stool, constipation, diarrhea, heartburn, melena, nausea and vomiting.  Genitourinary: Negative for dysuria, flank pain, frequency, hematuria and urgency.  Musculoskeletal: Positive for joint pain. Negative for back pain and myalgias.  Skin: Negative for rash.  Neurological: Negative for dizziness, tingling, focal weakness, seizures, weakness and headaches.  Endo/Heme/Allergies: Does not bruise/bleed easily.  Psychiatric/Behavioral: Negative for depression and suicidal ideas. The patient does not have insomnia.     Allergies  Allergen Reactions  . Levaquin [Levofloxacin In D5w] Rash    Patient Active Problem List   Diagnosis Date Noted  .  Leukocytosis 11/19/2016  . Erythrocytosis 11/19/2016  . Atrial flutter ([place]) 10/27/2016  . Diabetes type 2, uncontrolled ([place]) 09/01/2015  . 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 CAD (coronary artery disease) 09/01/2015  . Left main coronary artery disease 09/01/2015  . 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 Encounter for preoperative pulmonary examination 01/02/2015  . Typical atrial flutter ([place]) 11/27/2014  . Smoking history 11/27/2014  . Emphysema of lung ([place]) 11/27/2014  . Pneumonia, organism unspecified([place]) 11/27/2014  . Essential hypertension 11/27/2014     Past Medical History:  Diagnosis Date  . Atrial flutter ([place])   . COPD (chronic obstructive pulmonary disease) ([place])   . Diabetes mellitus without complication ([place])   . Hyperlipidemia   . Hypertension   . Polycythemia      Past Surgical History:  Procedure Laterality Date  . ELECTROPHYSIOLOGIC STUDY N/A 10/27/2016   Procedure: A-Flutter Ablation;  Surgeon: Deboraha Sprang, MD;  Location: [facility];  Service: Cardiovascular;  Laterality: N/A;  . TEE WITH CARDIOVERSION  12/16    Social History   Social History  . Marital status: Single    Spouse name: N/A  . Number of children: N/A  . Years of education: N/A   Occupational History  . Not on file.   Social History Main Topics  . Smoking status: Former Smoker    Packs/day: 1.50    Years: 45.00    Types: Cigarettes    Quit date: 11/27/2012  . Smokeless tobacco: Never Used  . Alcohol use Yes     Comment: beer every once a while once a month  . Drug use: No  . Sexual activity: Not on file   Other Topics Concern  . Not on file   Social  History Narrative  . No narrative on file     Family History  Problem Relation Age of Onset  . Hypertension Father      Current Outpatient Prescriptions:  .  budesonide-formoterol (SYMBICORT) 80-4.5 MCG/ACT inhaler, Inhale 1 puff into the lungs 2 times daily at 12 noon and 4 pm., Disp: ,  Rfl:  .  Cyanocobalamin 1000 MCG TBCR, Take 1,000 mcg by mouth daily., Disp: , Rfl:  .  glucose blood (FREESTYLE LITE) test strip, Check blood sugar 2x a day as directed, Disp: , Rfl:  .  metFORMIN (GLUCOPHAGE) 1000 MG tablet, Take 1 tablet (1,000 mg total) by mouth 2 (two) times daily with a meal., Disp: 180 tablet, Rfl: 0 .  metoprolol succinate (TOPROL-XL) 50 MG 24 hr tablet, Take 1 tablet (50 mg total) by mouth daily. Take with or immediately following a meal., Disp: 90 tablet, Rfl: 3 .  rosuvastatin (CRESTOR) 40 MG tablet, Take 1 tablet (40 mg total) by mouth daily., Disp: 30 tablet, Rfl: 11 .  tiotropium (SPIRIVA) 18 MCG inhalation capsule, Place 1 puff into inhaler and inhale daily., Disp: , Rfl:    Physical exam:  Vitals:   12/03/16 1359  BP: 115/72  Pulse: 73  Resp: 18  Temp: 98.6 F (37 C)  TempSrc: Tympanic  Weight: 191 lb 9.3 oz (86.9 kg)  Height: 5\' 8"  (1.727 m)   Physical Exam  Constitutional: He is oriented to person, place, and time and well-developed, well-nourished, and in no distress.  HENT:  Head: Normocephalic and atraumatic.  Eyes: EOM are normal. Pupils are equal, round, and reactive to light.  Neck: Normal range of motion.  Cardiovascular: Normal rate, regular rhythm and normal heart sounds.   Pulmonary/Chest: Effort normal and breath sounds normal.  Abdominal: Soft. Bowel sounds are normal.  Neurological: He is alert and oriented to person, place, and time.  Skin: Skin is warm and dry.       CMP Latest Ref Rng & Units 10/28/2016  Glucose 65 - 99 mg/dL 141(H)  BUN 6 - 20 mg/dL 31(H)  Creatinine 0.61 - 1.24 mg/dL 2.27(H)  Sodium 135 - 145 mmol/L 136  Potassium 3.5 - 5.1 mmol/L 4.6  Chloride 101 - 111 mmol/L 103  CO2 22 - 32 mmol/L 27  Calcium 8.9 - 10.3 mg/dL 8.5(L)  Total Protein 6.4 - 8.2 g/dL -  Total Bilirubin 0.2 - 1.0 mg/dL -  Alkaline Phos Unit/L -  AST 15 - 37 Unit/L -  ALT U/L -   CBC Latest Ref Rng & Units 12/03/2016  WBC 3.8 - 10.6  K/uL 7.6  Hemoglobin 13.0 - 18.0 g/dL 16.2  Hematocrit 40.0 - 52.0 % 46.9  Platelets 150 - 440 K/uL 137(L)    No images are attached to the encounter.  No results found.  Assessment and plan- Patient is a 71 y.o. male who has been refrred to [place] for immature cells seen on differential  BCR abl testign was negative. We will obtain repeat cbc with manual diff. Also check peripheral flow cytometry to r/o any primary hematologic disorder such as leukemia. 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 Will check JAK2 and epo levels today. I will see him back in 2 weeks time   Thank you for this kind referral and the opportunity to participate in the care of this patient   Visit Diagnosis 1. Need for prophylactic vaccination and inoculation against influenza   2. Monocytosis  Dr. Randa Evens, MD, MPH [employer] at [facility] Pager- [phone] 12/03/2016

## 2016-12-04 LAB — ERYTHROPOIETIN: ERYTHROPOIETIN: 8.1 m[IU]/mL (ref 2.6–18.5)

## 2016-12-07 LAB — COMP PANEL: LEUKEMIA/LYMPHOMA

## 2016-12-11 DIAGNOSIS — J449 Chronic obstructive pulmonary disease, unspecified: Secondary | ICD-10-CM | POA: Diagnosis not present

## 2016-12-14 LAB — JAK2  V617F QUAL. WITH REFLEX TO EXON 12: JAK2 GenotypR: UNDETERMINED

## 2016-12-17 ENCOUNTER — Ambulatory Visit: Payer: PPO | Admitting: Oncology

## 2016-12-24 ENCOUNTER — Inpatient Hospital Stay: Payer: PPO | Attending: Oncology | Admitting: Oncology

## 2016-12-24 ENCOUNTER — Encounter: Payer: Self-pay | Admitting: Oncology

## 2016-12-24 VITALS — BP 135/68 | HR 76 | Temp 98.0°F | Ht 67.0 in | Wt 190.9 lb

## 2016-12-24 DIAGNOSIS — J449 Chronic obstructive pulmonary disease, unspecified: Secondary | ICD-10-CM | POA: Diagnosis not present

## 2016-12-24 DIAGNOSIS — F17210 Nicotine dependence, cigarettes, uncomplicated: Secondary | ICD-10-CM | POA: Diagnosis not present

## 2016-12-24 DIAGNOSIS — G8929 Other chronic pain: Secondary | ICD-10-CM | POA: Diagnosis not present

## 2016-12-24 DIAGNOSIS — D72821 Monocytosis (symptomatic): Secondary | ICD-10-CM | POA: Insufficient documentation

## 2016-12-24 DIAGNOSIS — D751 Secondary polycythemia: Secondary | ICD-10-CM | POA: Insufficient documentation

## 2016-12-24 DIAGNOSIS — D696 Thrombocytopenia, unspecified: Secondary | ICD-10-CM | POA: Diagnosis not present

## 2016-12-24 DIAGNOSIS — M549 Dorsalgia, unspecified: Secondary | ICD-10-CM

## 2016-12-24 DIAGNOSIS — Z87891 Personal history of nicotine dependence: Secondary | ICD-10-CM | POA: Insufficient documentation

## 2016-12-24 DIAGNOSIS — R799 Abnormal finding of blood chemistry, unspecified: Secondary | ICD-10-CM | POA: Diagnosis not present

## 2016-12-24 DIAGNOSIS — I4892 Unspecified atrial flutter: Secondary | ICD-10-CM | POA: Insufficient documentation

## 2016-12-24 DIAGNOSIS — E119 Type 2 diabetes mellitus without complications: Secondary | ICD-10-CM | POA: Insufficient documentation

## 2016-12-24 DIAGNOSIS — I10 Essential (primary) hypertension: Secondary | ICD-10-CM | POA: Diagnosis not present

## 2016-12-24 DIAGNOSIS — Z7984 Long term (current) use of oral hypoglycemic drugs: Secondary | ICD-10-CM | POA: Insufficient documentation

## 2016-12-24 DIAGNOSIS — E785 Hyperlipidemia, unspecified: Secondary | ICD-10-CM | POA: Diagnosis not present

## 2016-12-24 NOTE — Progress Notes (Signed)
Patient here for follow up. No changes since last appointment.  

## 2016-12-24 NOTE — Progress Notes (Signed)
Hematology/Oncology Consult note [facility]  Telephone:([phone] Fax:([phone]  Patient Care Team: [name], MD as PCP - General (Cardiology) [name], MD as Consulting Physician (Cardiology)   Name of the patient: [name]  [id number]  [birth year]   Date of visit: 12/24/16  Diagnosis- 1. Immature cells in peripheral blood 2. polycythemia  Chief complaint/ Reason for visit- discuss results of bloodwork  Heme/Onc history: Patient is a 71 yr old male who has not seen hematology in the past. CBC with diff on 10/25/16 showed immature cells in differential. There was some monocytosis with meylocytes and metamyelocytes seen. Hence patient has been referred for the same. Patient feels well and enies any complaints other than chronic back pain  Results of blood work from 12/03/2016 was as follows: CBC showed white count of 7.6, H&H of 16.2/46.9 and a platelet count of 137. Differential at that time was normal and no immature cells were noted. Review of peripheral blood smear showed mild thrombocytopenia. Blasts are not identified. Peripheral flow cytometry did not reveal any significant diagnostic immunophenotypic abnormality. EPO level was normal at 8.1.JAK2 testing was not completed because of insufficient sample. BCR able testing done on 10/27/2016 did not reveal any evidence of 9. 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 Denies any complaints  ECOG PS- 1 Pain scale- 0   Review of systems- Review of Systems  Constitutional: Negative for chills, fever, malaise/fatigue and weight loss.  HENT: Negative for congestion, ear discharge and nosebleeds.   Eyes: Negative for blurred vision.  Respiratory: Negative for cough, hemoptysis, sputum production, shortness of breath and wheezing.   Cardiovascular: Negative for chest pain, palpitations, orthopnea and claudication.  Gastrointestinal: Negative for abdominal  pain, blood in stool, constipation, diarrhea, heartburn, melena, nausea and vomiting.  Genitourinary: Negative for dysuria, flank pain, frequency, hematuria and urgency.  Musculoskeletal: Negative for back pain, joint pain and myalgias.  Skin: Negative for rash.  Neurological: Negative for dizziness, tingling, focal weakness, seizures, weakness and headaches.  Endo/Heme/Allergies: Does not bruise/bleed easily.  Psychiatric/Behavioral: Negative for depression and suicidal ideas. The patient does not have insomnia.      Current treatment- observation  Allergies  Allergen Reactions  . Levaquin [Levofloxacin In D5w] Rash     Past Medical History:  Diagnosis Date  . Atrial flutter ([place])   . COPD (chronic obstructive pulmonary disease) ([place])   . Diabetes mellitus without complication ([place])   . Hyperlipidemia   . Hypertension   . Polycythemia      Past Surgical History:  Procedure Laterality Date  . ELECTROPHYSIOLOGIC STUDY N/A 10/27/2016   Procedure: A-Flutter Ablation;  Surgeon: Deboraha Sprang, MD;  Location: [facility];  Service: Cardiovascular;  Laterality: N/A;  . TEE WITH CARDIOVERSION  12/16    Social History   Social History  . Marital status: Single    Spouse name: N/A  . Number of children: N/A  . Years of education: N/A   Occupational History  . Not on file.   Social History Main Topics  . Smoking status: Former Smoker    Packs/day: 1.50    Years: 45.00    Types: Cigarettes    Quit date: 11/27/2012  . Smokeless tobacco: Never Used  . Alcohol use Yes     Comment: beer every once a while once a month  . Drug use: No  . Sexual activity: Not on file   Other Topics Concern  . Not  on file   Social History Narrative  . No narrative on file    Family History  Problem Relation Age of Onset  . Hypertension Father      Current Outpatient Prescriptions:  .  budesonide-formoterol (SYMBICORT) 80-4.5 MCG/ACT inhaler, Inhale 1 puff into the lungs 2  times daily at 12 noon and 4 pm., Disp: , Rfl:  .  Cyanocobalamin 1000 MCG TBCR, Take 1,000 mcg by mouth daily., Disp: , Rfl:  .  glucose blood (FREESTYLE LITE) test strip, Check blood sugar 2x a day as directed, Disp: , Rfl:  .  metFORMIN (GLUCOPHAGE) 1000 MG tablet, Take 1 tablet (1,000 mg total) by mouth 2 (two) times daily with a meal., Disp: 180 tablet, Rfl: 0 .  metoprolol succinate (TOPROL-XL) 50 MG 24 hr tablet, Take 1 tablet (50 mg total) by mouth daily. Take with or immediately following a meal., Disp: 90 tablet, Rfl: 3 .  rosuvastatin (CRESTOR) 40 MG tablet, Take 1 tablet (40 mg total) by mouth daily., Disp: 30 tablet, Rfl: 11 .  tiotropium (SPIRIVA) 18 MCG inhalation capsule, Place 1 puff into inhaler and inhale daily., Disp: , Rfl:   Physical exam:  Vitals:   12/24/16 0958  BP: 135/68  Pulse: 76  Temp: 98 F (36.7 C)  TempSrc: Tympanic  Weight: 190 lb 14.7 oz (86.6 kg)  Height: 5' 7"  (1.702 m)   Physical Exam  Constitutional: He is oriented to person, place, and time and well-developed, well-nourished, and in no distress.  HENT:  Head: Normocephalic and atraumatic.  Eyes: EOM are normal. Pupils are equal, round, and reactive to light.  Neck: Normal range of motion.  Cardiovascular: Normal rate, regular rhythm and normal heart sounds.   Pulmonary/Chest: Effort normal and breath sounds normal.  Abdominal: Soft. Bowel sounds are normal.  Neurological: He is alert and oriented to person, place, and time.  Skin: Skin is warm and dry.     CMP Latest Ref Rng & Units 10/28/2016  Glucose 65 - 99 mg/dL 141(H)  BUN 6 - 20 mg/dL 31(H)  Creatinine 0.61 - 1.24 mg/dL 2.27(H)  Sodium 135 - 145 mmol/L 136  Potassium 3.5 - 5.1 mmol/L 4.6  Chloride 101 - 111 mmol/L 103  CO2 22 - 32 mmol/L 27  Calcium 8.9 - 10.3 mg/dL 8.5(L)  Total Protein 6.4 - 8.2 g/dL -  Total Bilirubin 0.2 - 1.0 mg/dL -  Alkaline Phos Unit/L -  AST 15 - 37 Unit/L -  ALT U/L -   CBC Latest Ref Rng & Units  12/03/2016  WBC 3.8 - 10.6 K/uL 7.6  Hemoglobin 13.0 - 18.0 g/dL 16.2  Hematocrit 40.0 - 52.0 % 46.9  Platelets 150 - 440 K/uL 137(L)     Assessment and plan- Patient is a 71 y.o. male Who was referred to [place] for findings of immature cells in peripheral blood   1. Patient was noted to have immature granulocytes on 2 occasions 10/15/2016 and 11/25/2016. These have not been noted prior to or since then. Further workup including flow cytometry and BCR able testing as well as peripheral blood smear review did not reveal any significant abnormalities. At this time I'm inclined to monitor this conservatively without any further intervention such as bone marrow biopsy. I will see him back in 6 months time with a repeat CBC and a manual differential  2. Patient has mild polycythemia likely secondary to his smoking and his hemoglobin has remained stable between 15.9-17.8. Recent H&H was 16.2/46.9. His EPO  level has been normal. Jak 2 testing could not be completed due to insufficient sample. I will repeat jak 2 testing when I see him back in 6 months time. I will be monitoring his H&H conservatively without any further intervention such as phlebotomy   Visit Diagnosis 1. Polycythemia, secondary   2. Abnormal blood smear      Dr. Randa Evens, MD, MPH [facility] at [facility] Pager- [phone] 12/24/2016 10:36 AM

## 2016-12-26 ENCOUNTER — Other Ambulatory Visit: Payer: Self-pay | Admitting: *Deleted

## 2016-12-26 DIAGNOSIS — D751 Secondary polycythemia: Secondary | ICD-10-CM

## 2016-12-26 DIAGNOSIS — D72829 Elevated white blood cell count, unspecified: Secondary | ICD-10-CM

## 2016-12-28 DIAGNOSIS — I4892 Unspecified atrial flutter: Secondary | ICD-10-CM | POA: Diagnosis not present

## 2016-12-28 DIAGNOSIS — E1165 Type 2 diabetes mellitus with hyperglycemia: Secondary | ICD-10-CM | POA: Diagnosis not present

## 2016-12-28 DIAGNOSIS — J439 Emphysema, unspecified: Secondary | ICD-10-CM | POA: Diagnosis not present

## 2017-01-11 DIAGNOSIS — J449 Chronic obstructive pulmonary disease, unspecified: Secondary | ICD-10-CM | POA: Diagnosis not present

## 2017-02-08 DIAGNOSIS — J449 Chronic obstructive pulmonary disease, unspecified: Secondary | ICD-10-CM | POA: Diagnosis not present

## 2017-02-22 ENCOUNTER — Ambulatory Visit: Payer: PPO | Admitting: Oncology

## 2017-02-22 ENCOUNTER — Other Ambulatory Visit: Payer: PPO

## 2017-03-03 NOTE — Progress Notes (Signed)
Cardiology Office Note  Date:  03/04/2017   ID:  [name], [name] 14-Jun-1946, MRN [id number]  PCP:  [name], MD   Chief Complaint  Patient presents with  . other    6 month f/u no complaints today. Meds reviewed verbally with pt.    HPI:  Mr. Vanbenschoten is a 71 year old gentleman, patient of Dr. Rebecka Apley, with   hypertension,  diabetes,  COPD,  long smoking history, polycythemia,   tachycardia.  Cardiology evaluated the patient 10/29/2014.  EKG showed atrial flutter with heart rate 140s up to 150.  long smoking history for 45 years.  started on beta blockers, diltiazem, digoxin, eventually started on amiodarone infusion with anticoagulation, eliquis. He had TEE and cardioversion on 10/30/2014. Normal sinus rhythm was successfully restored and he was discharged in normal sinus rhythm Follow-up in the office on his last visit showed he had recurrent atrial flutter.  He underwent catheter ablation for his flutter October 2017 On anticoagulation, but this was discontinued on last visit to the office January 2018 with Dr. Blanchie Dessert, DM-1 HTN-1  CHADSVASc 3 Metoprolol dose was increased  On today's visit he feels well, no complaints, recent sinus issues with pollen, may be had a cold EKG and clinical exam shows he is Back in atrial flutter Ventricular Rate 130 bpm  No other significant stressors He does not know when the tachycardia started, reports that he feels great No shortness of breath Currently not on a coagulation, only taking metoprolol Denies any significant lower extremity edema  Diabetes managed by Dr. Eddie Dibbles Compliant with his inhalers  EKG personally reviewed by myself on todays visit shows atrial flutter with ventricular rate 132 bpm  Other past medical history reviewed  total cholesterol 167, LDL 100, hemoglobin A1c 7.7  Previous cardioversion on 01/07/2015 which successfully restored normal sinus rhythm In follow-up was found to be in  atrial flutter in clinic  TEE report showed ejection fraction 45-50%, right and left atrium mildly enlarged, RV mildly dilated with mildly reduced systolic function, small amount of plaque in the aorta  CT scan of the chest 10/29/2014 in the hospital showed bilateral predominantly lower lobe pulmonary opacity most consistent with pneumonia   PMH:   has a past medical history of Atrial flutter ([place]); COPD (chronic obstructive pulmonary disease) ([place]); Diabetes mellitus without complication ([place]); Hyperlipidemia; Hypertension; and Polycythemia.  PSH:    Past Surgical History:  Procedure Laterality Date  . ELECTROPHYSIOLOGIC STUDY N/A 10/27/2016   Procedure: A-Flutter Ablation;  Surgeon: Deboraha Sprang, MD;  Location: [facility];  Service: Cardiovascular;  Laterality: N/A;  . TEE WITH CARDIOVERSION  12/16    Current Outpatient Prescriptions  Medication Sig Dispense Refill  . budesonide-formoterol (SYMBICORT) 80-4.5 MCG/ACT inhaler Inhale 1 puff into the lungs 2 times daily at 12 noon and 4 pm.    . Cyanocobalamin 1000 MCG TBCR Take 1,000 mcg by mouth daily.    Marland Kitchen glucose blood (FREESTYLE LITE) test strip Check blood sugar 2x a day as directed    . metFORMIN (GLUCOPHAGE) 1000 MG tablet Take 1 tablet (1,000 mg total) by mouth 2 (two) times daily with a meal. 180 tablet 0  . metoprolol succinate (TOPROL-XL) 50 MG 24 hr tablet Take 1 tablet (50 mg total) by mouth daily. Take with or immediately following a meal. 90 tablet 3  . rosuvastatin (CRESTOR) 40 MG tablet Take 1 tablet (40 mg total) by mouth daily. 30 tablet 11  . tiotropium (SPIRIVA) 18 MCG inhalation  capsule Place 1 puff into inhaler and inhale daily.     No current facility-administered medications for this visit.      Allergies:   Levaquin [levofloxacin in d5w]   Social History:  The patient  reports that he quit smoking about 4 years ago. His smoking use included Cigarettes. He has a 67.50 pack-year smoking history. He  has never used smokeless tobacco. He reports that he drinks alcohol. He reports that he does not use drugs.   Family History:   family history includes Hypertension in his father.    Review of Systems: Review of Systems  Constitutional: Negative.   Respiratory: Negative.   Cardiovascular: Negative.   Gastrointestinal: Negative.   Musculoskeletal: Negative.   Neurological: Negative.   Psychiatric/Behavioral: Negative.   All other systems reviewed and are negative.    PHYSICAL EXAM: VS:  BP 124/88 (BP Location: Left Arm, Patient Position: Sitting, Cuff Size: Normal)   Pulse (!) 132   Ht 5\' 7"  (1.702 m)   Wt 195 lb 8 oz (88.7 kg)   BMI 30.62 kg/m  , BMI Body mass index is 30.62 kg/m. GEN: Well nourished, well developed, in no acute distress  HEENT: normal  Neck: no JVD, carotid bruits, or masses Cardiac: Regular rhythm, tachycardic no murmurs, rubs, or gallops,no edema  Respiratory:  Mildly decreased breath sounds throughout, normal work of breathing GI: soft, nontender, nondistended, + BS MS: no deformity or atrophy  Skin: warm and dry, no rash Neuro:  Strength and sensation are intact Psych: euthymic mood, full affect    Recent Labs: 10/28/2016: BUN 31; Creatinine, Ser 2.27; Potassium 4.6; Sodium 136 11/25/2016: TSH 2.800 12/03/2016: Hemoglobin 16.2; Platelets 137    Lipid Panel No results found for: CHOL, HDL, LDLCALC, TRIG    Wt Readings from Last 3 Encounters:  03/04/17 195 lb 8 oz (88.7 kg)  12/24/16 190 lb 14.7 oz (86.6 kg)  12/03/16 191 lb 9.3 oz (86.9 kg)       ASSESSMENT AND PLAN:   Essential hypertension - Plan: EKG 12-Lead, CANCELED: EKG 12-Lead We will restart medications as below for recurrent atrial flutter  Coronary artery disease involving native coronary artery of native heart with angina pectoris ([place]) - Plan: EKG 12-Lead, CANCELED: EKG 12-Lead Currently with no symptoms of angina. No further workup at this time. Continue current  medication regimen.  Typical atrial flutter ([place]) - Plan: EKG 12-Lead He is back in atrial flutter on today's visit ventricular rate 130 bpm We will restart digoxin 0.25 mill grams daily Restart Cardizem 180 mg daily (previously was taking this twice a day but blood pressure is low) Stay on metoprolol 50  Restart anticoagulation , eliquis 5 twice a day  Discussed various strategies to restore normal sinus rhythm including rate control with cardioversion in 3-4 weeks  We will see see Dr. Caryl Comes . Potentially could retry ablation.   Centrilobular emphysema ([place]) Stop smoking many years ago, maintained on his inhalers  Uncontrolled type 2 diabetes mellitus with other circulatory complication, without long-term current use of insulin ([place]) Followed by primary care We have encouraged continued exercise, careful diet management in an effort to lose weight.   Total encounter time more than 25 minutes  Greater than 50% was spent in counseling and coordination of care with the patient   Disposition:   F/U  one week   Orders Placed This Encounter  Procedures  . EKG 12-Lead     Signed, Esmond Plants, M.D., Ph.D. 03/04/2017  [place]  [place], [place]

## 2017-03-04 ENCOUNTER — Ambulatory Visit (INDEPENDENT_AMBULATORY_CARE_PROVIDER_SITE_OTHER): Payer: PPO | Admitting: Cardiovascular Disease

## 2017-03-04 ENCOUNTER — Encounter: Payer: Self-pay | Admitting: Cardiovascular Disease

## 2017-03-04 VITALS — BP 124/88 | HR 132 | Ht 67.0 in | Wt 195.5 lb

## 2017-03-04 DIAGNOSIS — I2510 Atherosclerotic heart disease of native coronary artery without angina pectoris: Secondary | ICD-10-CM | POA: Diagnosis not present

## 2017-03-04 DIAGNOSIS — E1165 Type 2 diabetes mellitus with hyperglycemia: Secondary | ICD-10-CM

## 2017-03-04 DIAGNOSIS — E1159 Type 2 diabetes mellitus with other circulatory complications: Secondary | ICD-10-CM

## 2017-03-04 DIAGNOSIS — I483 Typical atrial flutter: Secondary | ICD-10-CM

## 2017-03-04 DIAGNOSIS — I25119 Atherosclerotic heart disease of native coronary artery with unspecified angina pectoris: Secondary | ICD-10-CM | POA: Diagnosis not present

## 2017-03-04 DIAGNOSIS — I10 Essential (primary) hypertension: Secondary | ICD-10-CM | POA: Diagnosis not present

## 2017-03-04 DIAGNOSIS — J432 Centrilobular emphysema: Secondary | ICD-10-CM | POA: Diagnosis not present

## 2017-03-04 DIAGNOSIS — Z87891 Personal history of nicotine dependence: Secondary | ICD-10-CM | POA: Diagnosis not present

## 2017-03-04 DIAGNOSIS — IMO0002 Reserved for concepts with insufficient information to code with codable children: Secondary | ICD-10-CM

## 2017-03-04 MED ORDER — APIXABAN 5 MG PO TABS: 5.0000 mg | ORAL_TABLET | Freq: Two times a day (BID) | ORAL | 3 refills | Status: DC

## 2017-03-04 MED ORDER — DIGOXIN 250 MCG PO TABS: 0.2500 mg | ORAL_TABLET | Freq: Every day | ORAL | 3 refills | Status: DC

## 2017-03-04 MED ORDER — DILTIAZEM HCL ER COATED BEADS 180 MG PO CP24: 180.0000 mg | ORAL_CAPSULE | Freq: Every day | ORAL | 3 refills | Status: DC

## 2017-03-04 MED ORDER — METOPROLOL SUCCINATE ER 50 MG PO TB24: 50.0000 mg | ORAL_TABLET | Freq: Every day | ORAL | 3 refills | Status: DC

## 2017-03-04 NOTE — Patient Instructions (Addendum)
Medication Instructions:   Restart the digoxin one a day (take 2 the first day, then one a day) Please restart eliquis 5 mg twice a day Please restart diltiazem 180 mg once a day Stay on metoprolol   Labwork:  No new labs needed  Testing/Procedures:  No further testing at this time   I recommend watching educational videos on topics of interest to you at:       [email or web address]  Enter code: HEARTCARE    Follow-Up: It was a pleasure seeing you in the office today. Please call us if you have new issues that need to be addressed before your next appt.  [phone]  Your physician wants you to follow-up in:  1 week for EKG, with Jaedon Siler appt   If you need a refill on your cardiac medications before your next appointment, please call your pharmacy.

## 2017-03-10 ENCOUNTER — Encounter: Payer: Self-pay | Admitting: Cardiovascular Disease

## 2017-03-10 ENCOUNTER — Other Ambulatory Visit: Payer: Self-pay | Admitting: Cardiovascular Disease

## 2017-03-10 ENCOUNTER — Ambulatory Visit (INDEPENDENT_AMBULATORY_CARE_PROVIDER_SITE_OTHER): Payer: PPO | Admitting: Cardiovascular Disease

## 2017-03-10 VITALS — BP 136/70 | HR 63 | Ht 67.0 in | Wt 194.8 lb

## 2017-03-10 DIAGNOSIS — I483 Typical atrial flutter: Secondary | ICD-10-CM | POA: Diagnosis not present

## 2017-03-10 NOTE — Patient Instructions (Signed)
Medication Instructions:   Please stop the digoxin Stay on the diltiazem one a day Stay on the eliquis 5 mg twice a day  Labwork:  No new labs needed  Testing/Procedures:  No further testing at this time   I recommend watching educational videos on topics of interest to you at:       [email or web address]  Enter code: HEARTCARE    Follow-Up: It was a pleasure seeing you in the office today. Please call us if you have new issues that need to be addressed before your next appt.  [phone]  Your physician wants you to follow-up in: 6 months.  You will receive a reminder letter in the mail two months in advance. If you don't receive a letter, please call our office to schedule the follow-up appointment.  If you need a refill on your cardiac medications before your next appointment, please call your pharmacy.

## 2017-03-10 NOTE — Progress Notes (Signed)
Cardiology Office Note  Date:  03/10/2017   ID:  [name], [name] 09-01-1946, MRN [id number]  PCP:  [name], MD   Chief Complaint  Patient presents with  . other    C/o flutter. Meds reviewed verbally with pt.    HPI:  [name] is a 71 year old gentleman, patient of [name], with   hypertension,  diabetes,  COPD,  long smoking history, polycythemia,   tachycardia.  Cardiology evaluated the patient 10/29/2014.  EKG showed atrial flutter with heart rate 140s up to 150.  long smoking history for 45 years.  started on beta blockers, diltiazem, digoxin, eventually started on amiodarone infusion with anticoagulation, eliquis. He had TEE and cardioversion on 10/30/2014. Normal sinus rhythm was successfully restored and he was discharged in normal sinus rhythm He presents for follow-up of his atrial flutter  catheter ablation for his flutter October 2017 On anticoagulation, but this was discontinued on last visit to the office January 2018 with [name], DM-1 HTN-1  CHADSVASc 3 Metoprolol dose was increased  On his last clinic visit last week  he was in atrial flutter ventricular rate 130 bpm He was asymptomatic except for some sinus issues from pollen, no shortness of breath, no leg edema Medication changes made for rate control, diltiazem 180 mg daily , digoxin ,  placed back on anticoagulation, eliquis 5 twice a day   In follow-up today he has not noticed any difference in the way he feels, perhaps it is easier to walk.  Review of EKG personally by myself shows  normal sinus rhythm with rate 63 bpm left axis deviation, no significant ST or T-wave changes  No side effects from the medications we started last week Blood pressure stable  Diabetes managed by [name] Compliant with his inhalers  Other past medical history reviewed  total cholesterol 167, LDL 100, hemoglobin A1c 7.7  Previous cardioversion on 01/07/2015 which successfully  restored normal sinus rhythm In follow-up was found to be in atrial flutter in clinic  TEE report showed ejection fraction 45-50%, right and left atrium mildly enlarged, RV mildly dilated with mildly reduced systolic function, small amount of plaque in the aorta  CT scan of the chest 10/29/2014 in the hospital showed bilateral predominantly lower lobe pulmonary opacity most consistent with pneumonia   PMH:   has a past medical history of Atrial flutter ([place]); COPD (chronic obstructive pulmonary disease) ([place]); Diabetes mellitus without complication ([place]); Hyperlipidemia; Hypertension; and Polycythemia.  PSH:    Past Surgical History:  Procedure Laterality Date  . ELECTROPHYSIOLOGIC STUDY N/A 10/27/2016   Procedure: A-Flutter Ablation;  Surgeon: Deboraha Sprang, MD;  Location: [facility];  Service: Cardiovascular;  Laterality: N/A;  . TEE WITH CARDIOVERSION  12/16    Current Outpatient Prescriptions  Medication Sig Dispense Refill  . apixaban (ELIQUIS) 5 MG TABS tablet Take 1 tablet (5 mg total) by mouth 2 (two) times daily. 180 tablet 3  . budesonide-formoterol (SYMBICORT) 80-4.5 MCG/ACT inhaler Inhale 1 puff into the lungs 2 times daily at 12 noon and 4 pm.    . Cyanocobalamin 1000 MCG TBCR Take 1,000 mcg by mouth daily.    Marland Kitchen diltiazem (CARDIZEM CD) 180 MG 24 hr capsule Take 1 capsule (180 mg total) by mouth daily. 90 capsule 3  . glucose blood (FREESTYLE LITE) test strip Check blood sugar 2x a day as directed    . metFORMIN (GLUCOPHAGE) 1000 MG tablet Take 1 tablet (1,000 mg total) by mouth  2 (two) times daily with a meal. 180 tablet 0  . metoprolol succinate (TOPROL-XL) 50 MG 24 hr tablet Take 1 tablet (50 mg total) by mouth daily. Take with or immediately following a meal. 90 tablet 3  . rosuvastatin (CRESTOR) 40 MG tablet Take 1 tablet (40 mg total) by mouth daily. 30 tablet 11  . tiotropium (SPIRIVA) 18 MCG inhalation capsule Place 1 puff into inhaler and inhale daily.      No current facility-administered medications for this visit.      Allergies:   Levaquin [levofloxacin in d5w]   Social History:  The patient  reports that he quit smoking about 4 years ago. His smoking use included Cigarettes. He has a 67.50 pack-year smoking history. He has never used smokeless tobacco. He reports that he drinks alcohol. He reports that he does not use drugs.   Family History:   family history includes Hypertension in his father.    Review of Systems: Review of Systems  Constitutional: Negative.   Respiratory: Negative.   Cardiovascular: Negative.   Gastrointestinal: Negative.   Musculoskeletal: Negative.   Neurological: Negative.   Psychiatric/Behavioral: Negative.   All other systems reviewed and are negative.    PHYSICAL EXAM: VS:  BP 136/70 (BP Location: Left Arm, Patient Position: Sitting, Cuff Size: Normal)   Pulse 63   Ht 5\' 7"  (1.702 m)   Wt 194 lb 12 oz (88.3 kg)   BMI 30.50 kg/m  , BMI Body mass index is 30.5 kg/m. GEN: Well nourished, well developed, in no acute distress  HEENT: normal  Neck: no JVD, carotid bruits, or masses Cardiac: Regular rhythm, tachycardic no murmurs, rubs, or gallops,no edema  Respiratory:  Moderately decreased breath sounds throughout, normal work of breathing GI: soft, nontender, nondistended, + BS MS: no deformity or atrophy  Skin: warm and dry, no rash Neuro:  Strength and sensation are intact Psych: euthymic mood, full affect    Recent Labs: 10/28/2016: BUN 31; Creatinine, Ser 2.27; Potassium 4.6; Sodium 136 11/25/2016: TSH 2.800 12/03/2016: Hemoglobin 16.2; Platelets 137    Lipid Panel No results found for: CHOL, HDL, LDLCALC, TRIG    Wt Readings from Last 3 Encounters:  03/10/17 194 lb 12 oz (88.3 kg)  03/04/17 195 lb 8 oz (88.7 kg)  12/24/16 190 lb 14.7 oz (86.6 kg)       ASSESSMENT AND PLAN:   Essential hypertension -  Recommended he continue diltiazem 180 mg daily Blood pressure  stable  Coronary artery disease involving native coronary artery of native heart with angina pectoris ([place]) - Plan: EKG 12-Lead, CANCELED: EKG 12-Lead Currently with no symptoms of angina. No further workup at this time. Continue current medication regimen.  Typical atrial flutter (HCC) - Plan: EKG 12-Lead He has converted back to normal sinus rhythm on his own with medical management Stop the digoxin 0.25 mill grams daily, take only as needed for breakthrough atrial flutter Stay on Cardizem 180 mg daily (previously was taking this twice a day but blood pressure is low) Stay on metoprolol 50  Continue anticoagulation , eliquis 5 twice a day  We'll send a message to Dr. Caryl Comes, if he has frequent episodes, maybe a candidate for repeat ablation  Centrilobular emphysema ([place]) Stopped smoking many years ago, maintained on his inhalers Decreased air movement on physical exam  Uncontrolled type 2 diabetes mellitus with other circulatory complication, without long-term current use of insulin ([place]) We have encouraged  careful diet management in an effort to lose weight.  Total encounter time more than 25 minutes  Greater than 50% was spent in counseling and coordination of care with the patient   Disposition:   F/U  6 months   Orders Placed This Encounter  Procedures  . EKG 12-Lead     Signed, Esmond Plants, M.D., Ph.D. 03/10/2017  [place], [place]

## 2017-03-11 DIAGNOSIS — J449 Chronic obstructive pulmonary disease, unspecified: Secondary | ICD-10-CM | POA: Diagnosis not present

## 2017-03-25 DIAGNOSIS — H60519 Acute actinic otitis externa, unspecified ear: Secondary | ICD-10-CM | POA: Diagnosis not present

## 2017-03-25 DIAGNOSIS — J399 Disease of upper respiratory tract, unspecified: Secondary | ICD-10-CM | POA: Diagnosis not present

## 2017-03-25 DIAGNOSIS — D471 Chronic myeloproliferative disease: Secondary | ICD-10-CM | POA: Diagnosis not present

## 2017-03-25 DIAGNOSIS — I4892 Unspecified atrial flutter: Secondary | ICD-10-CM | POA: Diagnosis not present

## 2017-04-05 ENCOUNTER — Ambulatory Visit (INDEPENDENT_AMBULATORY_CARE_PROVIDER_SITE_OTHER): Payer: PPO | Admitting: Internal Medicine

## 2017-04-05 ENCOUNTER — Encounter: Payer: Self-pay | Admitting: Internal Medicine

## 2017-04-05 VITALS — BP 122/60 | HR 64 | Resp 16 | Ht 67.0 in | Wt 194.0 lb

## 2017-04-05 DIAGNOSIS — J449 Chronic obstructive pulmonary disease, unspecified: Secondary | ICD-10-CM

## 2017-04-05 MED ORDER — BUDESONIDE-FORMOTEROL FUMARATE 80-4.5 MCG/ACT IN AERO: 1.0000 | INHALATION_SPRAY | Freq: Two times a day (BID) | RESPIRATORY_TRACT | 0 refills | Status: DC

## 2017-04-05 NOTE — Progress Notes (Signed)
MRN# [id number] [name] [birth year]   CC: Chief Complaint  Patient presents with  . Follow-up    Pt had a cold several weeks ago. He is feeling pretty good denies any pulmonary sx.  . Emphysema    Pt wears 2.5 L 02 prn at bedtime.      Brief History: HPI 01/03/15 -71 year old male with a past medical history significant for atrial flutter, COPD, hypertension referred by Dr.Golan for COPD optimization and preop pulmonary evaluation prior to scheduled cardioversion for atrial flutter. Patient had a recent admission to the [facility] in December 2050 for 8 days, he was noted to be tachycardic at his oncology office for which he is following fall polycythemia vera do not citation he was noted to be in atrial flutter, also noted to have a pneumonia (chest x-ray showed patchy airspace disease); at that time he was treated with antibiotics and steroids. Patient states that he's never been hospitalized for breathing issues are her to be intubated for any type of respiratory distress. He states that he gets a yearly cold but does not need admission for antibiotics every year. His primary care physician has been on Symbicort and Spiriva for which he uses on a regular basis and rinses afterwards. He does not endorse a morning cough, or sputum production. He quit smoking 2 years ago. He is currently following with cardiology and will have a scheduled cardioversion for atrial flutter early next week. Plan - COPD well controlled on Symbicort and Spiriva, follow up in 1 month after cardioversion   Events since last clinic visit: Patient presents today for follow-up visit of his COPD.  Overall he states he is doing well.  No signs of infection at this time Has intermittent SOB but COPD seems to be well controlled Uses oxygen at night 2.5 l [place]  No distress at this time  Medication:    Current Outpatient Prescriptions:  .  apixaban (ELIQUIS) 5 MG TABS tablet, Take 1 tablet (5  mg total) by mouth 2 (two) times daily., Disp: 180 tablet, Rfl: 3 .  budesonide-formoterol (SYMBICORT) 80-4.5 MCG/ACT inhaler, Inhale 1 puff into the lungs 2 times daily at 12 noon and 4 pm., Disp: , Rfl:  .  Cyanocobalamin 1000 MCG TBCR, Take 1,000 mcg by mouth daily., Disp: , Rfl:  .  diltiazem (CARDIZEM CD) 180 MG 24 hr capsule, Take 1 capsule (180 mg total) by mouth daily., Disp: 90 capsule, Rfl: 3 .  glucose blood (FREESTYLE LITE) test strip, Check blood sugar 2x a day as directed, Disp: , Rfl:  .  metFORMIN (GLUCOPHAGE) 1000 MG tablet, Take 1 tablet (1,000 mg total) by mouth 2 (two) times daily with a meal., Disp: 180 tablet, Rfl: 0 .  metoprolol succinate (TOPROL-XL) 50 MG 24 hr tablet, Take 1 tablet (50 mg total) by mouth daily. Take with or immediately following a meal., Disp: 90 tablet, Rfl: 3 .  rosuvastatin (CRESTOR) 40 MG tablet, TAKE ONE TABLET BY MOUTH ONCE DAILY, Disp: 30 tablet, Rfl: 3 .  tiotropium (SPIRIVA) 18 MCG inhalation capsule, Place 1 puff into inhaler and inhale daily., Disp: , Rfl: \   Review of Systems: Gen:  Denies  fever, sweats, chills HEENT: Denies blurred vision, double vision, ear pain, eye pain, hearing loss, nose bleeds, sore throat Cvc:  No dizziness, chest pain or heaviness Resp:   Admits to: Mild shortness of breath with exertion, this is chronic, mild productive whitish sputum over the past 3 days Gi: Denies  swallowing difficulty, stomach pain, nausea or vomiting, diarrhea, constipation, bowel incontinence Other:  All other systems negative  Allergies:  Levaquin [levofloxacin in d5w]  Physical Examination:  VS: BP 122/60 (BP Location: Right Arm, Patient Position: Sitting, Cuff Size: Normal)   Pulse 64   Resp 16   Ht 5\' 7"  (1.702 m)   Wt 194 lb (88 kg)   SpO2 93%   BMI 30.38 kg/m   General Appearance: No distress  HEENT: PERRLA, no ptosis, no other lesions noticed Pulmonary:normal breath sounds., diaphragmatic excursion normal.No wheezing, No  rales   Cardiovascular:  Normal S1,S2.  No m/r/g.     Abdomen:Exam: Benign, Soft, non-tender, No masses  Skin:   warm, no rashes, no ecchymosis  Extremities: normal, no cyanosis, clubbing, warm with normal capillary refill.     Pulmonary function testing 05/07/2015 FEV1 62% FEV1/FVC 57% RV 136 TLC 93 RV/TLC 144 DLCO uncorrected 67% Impression: Moderate obstruction with no significant broncho-dilator response. Moderate decrease in DLCO.  Coronary function testing 08/18/2016 FEV1 40% FEV1/FVC 58% RV 170 TLC 95 RV/TLC 175 DLCO 58 Impression: Moderate-severe obstruction with minor bronchodilator response, 14% change in FVC, moderate decrease in DLCO 6MWT - total distance 1181 feet/360 m, low saturation 92%, highest heart rate 64; dyspnea score at the end of test is 0.   Assessment and Plan:    71 year old male past medical history of tobacco abuse, seen in consultation for Moderate COPD Gold Stage A with chronic hypoxic resp failure  1.COPD type A Currently COPD is well controlled  Given optimal treatment with Symbicort and Spiriva for a number of years by his primary care physician. So Will plan stop Spiriva and assess resp status at next visit -continue Symbicort and albuterol as needed   2.Review of PFT's Pulmonary function testing 05/07/2015: FEV1 60%, FEV1/FVC 57%. Moderate obstruction and a second dilator response, moderate decrease in DLCO. PFT 08/24/16: FEV1 48%, FEV1/FVC: 58% - significant drop with mod-severe obstruction -   3.SOB-likely from COPD  4.Chronic Hypoxic resp failure -Patient benefits from oxygen therapy 2.5L  at night -recommend using oxygen as prescribed -patient needs this for survival  Follow up in 3 months  Patient satisfied with Plan of action and management. All questions answered  Corrin Parker, M.D.  [facility]  Medical Director [place] Director [facility] Cardio-Pulmonary Department

## 2017-04-05 NOTE — Patient Instructions (Addendum)
Stop EXOGACG and assess Breathing Continue Symbicort

## 2017-04-07 DIAGNOSIS — E1129 Type 2 diabetes mellitus with other diabetic kidney complication: Secondary | ICD-10-CM | POA: Diagnosis not present

## 2017-04-07 DIAGNOSIS — R809 Proteinuria, unspecified: Secondary | ICD-10-CM | POA: Diagnosis not present

## 2017-04-10 DIAGNOSIS — J449 Chronic obstructive pulmonary disease, unspecified: Secondary | ICD-10-CM | POA: Diagnosis not present

## 2017-04-14 DIAGNOSIS — E1129 Type 2 diabetes mellitus with other diabetic kidney complication: Secondary | ICD-10-CM | POA: Diagnosis not present

## 2017-04-14 DIAGNOSIS — R809 Proteinuria, unspecified: Secondary | ICD-10-CM | POA: Diagnosis not present

## 2017-04-14 DIAGNOSIS — R7989 Other specified abnormal findings of blood chemistry: Secondary | ICD-10-CM | POA: Diagnosis not present

## 2017-04-14 DIAGNOSIS — E279 Disorder of adrenal gland, unspecified: Secondary | ICD-10-CM | POA: Diagnosis not present

## 2017-04-14 DIAGNOSIS — E538 Deficiency of other specified B group vitamins: Secondary | ICD-10-CM | POA: Diagnosis not present

## 2017-04-14 DIAGNOSIS — E785 Hyperlipidemia, unspecified: Secondary | ICD-10-CM | POA: Diagnosis not present

## 2017-04-14 DIAGNOSIS — E781 Pure hyperglyceridemia: Secondary | ICD-10-CM | POA: Diagnosis not present

## 2017-05-11 DIAGNOSIS — J449 Chronic obstructive pulmonary disease, unspecified: Secondary | ICD-10-CM | POA: Diagnosis not present

## 2017-06-10 DIAGNOSIS — J449 Chronic obstructive pulmonary disease, unspecified: Secondary | ICD-10-CM | POA: Diagnosis not present

## 2017-06-20 DIAGNOSIS — N183 Chronic kidney disease, stage 3 (moderate): Secondary | ICD-10-CM | POA: Diagnosis not present

## 2017-06-20 DIAGNOSIS — R319 Hematuria, unspecified: Secondary | ICD-10-CM | POA: Diagnosis not present

## 2017-06-20 DIAGNOSIS — E1122 Type 2 diabetes mellitus with diabetic chronic kidney disease: Secondary | ICD-10-CM | POA: Diagnosis not present

## 2017-06-20 DIAGNOSIS — R809 Proteinuria, unspecified: Secondary | ICD-10-CM | POA: Diagnosis not present

## 2017-06-20 DIAGNOSIS — I129 Hypertensive chronic kidney disease with stage 1 through stage 4 chronic kidney disease, or unspecified chronic kidney disease: Secondary | ICD-10-CM | POA: Diagnosis not present

## 2017-06-23 ENCOUNTER — Inpatient Hospital Stay: Payer: PPO | Attending: Oncology

## 2017-06-23 ENCOUNTER — Encounter: Payer: Self-pay | Admitting: Oncology

## 2017-06-23 ENCOUNTER — Inpatient Hospital Stay (HOSPITAL_BASED_OUTPATIENT_CLINIC_OR_DEPARTMENT_OTHER): Payer: PPO | Admitting: Oncology

## 2017-06-23 VITALS — BP 127/81 | HR 73 | Temp 97.6°F | Resp 18 | Ht 67.0 in | Wt 197.6 lb

## 2017-06-23 DIAGNOSIS — D696 Thrombocytopenia, unspecified: Secondary | ICD-10-CM | POA: Insufficient documentation

## 2017-06-23 DIAGNOSIS — Z79899 Other long term (current) drug therapy: Secondary | ICD-10-CM

## 2017-06-23 DIAGNOSIS — I10 Essential (primary) hypertension: Secondary | ICD-10-CM | POA: Diagnosis not present

## 2017-06-23 DIAGNOSIS — D751 Secondary polycythemia: Secondary | ICD-10-CM

## 2017-06-23 DIAGNOSIS — Z7901 Long term (current) use of anticoagulants: Secondary | ICD-10-CM | POA: Insufficient documentation

## 2017-06-23 DIAGNOSIS — M549 Dorsalgia, unspecified: Secondary | ICD-10-CM | POA: Diagnosis not present

## 2017-06-23 DIAGNOSIS — Z87891 Personal history of nicotine dependence: Secondary | ICD-10-CM | POA: Diagnosis not present

## 2017-06-23 DIAGNOSIS — J449 Chronic obstructive pulmonary disease, unspecified: Secondary | ICD-10-CM | POA: Insufficient documentation

## 2017-06-23 DIAGNOSIS — E119 Type 2 diabetes mellitus without complications: Secondary | ICD-10-CM | POA: Insufficient documentation

## 2017-06-23 DIAGNOSIS — Z7984 Long term (current) use of oral hypoglycemic drugs: Secondary | ICD-10-CM | POA: Diagnosis not present

## 2017-06-23 DIAGNOSIS — R799 Abnormal finding of blood chemistry, unspecified: Secondary | ICD-10-CM | POA: Diagnosis not present

## 2017-06-23 DIAGNOSIS — E785 Hyperlipidemia, unspecified: Secondary | ICD-10-CM

## 2017-06-23 DIAGNOSIS — G8929 Other chronic pain: Secondary | ICD-10-CM

## 2017-06-23 DIAGNOSIS — D72829 Elevated white blood cell count, unspecified: Secondary | ICD-10-CM

## 2017-06-23 LAB — DIFFERENTIAL
BASOS ABS: 0 10*3/uL (ref 0–0.1)
BASOS PCT: 1 %
EOS ABS: 0.1 10*3/uL (ref 0–0.7)
EOS PCT: 2 %
Lymphocytes Relative: 21 %
Lymphs Abs: 1.2 10*3/uL (ref 1.0–3.6)
MONOS PCT: 11 %
Monocytes Absolute: 0.7 10*3/uL (ref 0.2–1.0)
NEUTROS ABS: 3.8 10*3/uL (ref 1.4–6.5)
Neutrophils Relative %: 65 %

## 2017-06-23 LAB — CBC
HCT: 42.2 % (ref 40.0–52.0)
Hemoglobin: 14.2 g/dL (ref 13.0–18.0)
MCH: 33.1 pg (ref 26.0–34.0)
MCHC: 33.7 g/dL (ref 32.0–36.0)
MCV: 98.2 fL (ref 80.0–100.0)
PLATELETS: 137 10*3/uL — AB (ref 150–440)
RBC: 4.3 MIL/uL — AB (ref 4.40–5.90)
RDW: 12.9 % (ref 11.5–14.5)
WBC: 5.8 10*3/uL (ref 3.8–10.6)

## 2017-06-23 NOTE — Progress Notes (Signed)
Hematology/Oncology Consult note [facility]  [phone] [phone]  Patient Care Team: [name], MD as PCP - General (Cardiology) [name], MD as Consulting Physician (Cardiology)   Name of the patient: [name]  [id number]  1946/11/06   Date of visit: 06/23/17  Diagnosis- 1. Immature cells in peripheral blood 2. polycythemia  Chief complaint/ Reason for visit- routine f/u  Heme/Onc history: Patient is a 71 yr old male who was found to have CBC with diff on 10/25/16 that showed immature cells in differential. There was some monocytosis with myelocytes and metamyelocytes seen. Hence patient has been referred for the same. Patient feels well and enies any complaints other than chronic back pain  Results of blood work from 12/03/2016 was as follows: CBC showed white count of 7.6, H&H of 16.2/46.9 and a platelet count of 137. Differential at that time was normal and no immature cells were noted. Review of peripheral blood smear showed mild thrombocytopenia. Blasts are not identified. Peripheral flow cytometry did not reveal any significant diagnostic immunophenotypic abnormality. EPO level was normal at 8.1.JAK2 testing was not completed because of insufficient sample. BCR abl testing done on 10/27/2016 did not reveal any evidence of 9:22 translocation suggestive of CML  Interval history- he is doing well. Denies any complaints  ECOG PS- 0 Pain scale- 0   Review of systems- Review of Systems  Constitutional: Negative for chills, fever, malaise/fatigue and weight loss.  HENT: Negative for congestion, ear discharge and nosebleeds.   Eyes: Negative for blurred vision.  Respiratory: Negative for cough, hemoptysis, sputum production, shortness of breath and wheezing.   Cardiovascular: Negative for chest pain, palpitations, orthopnea and claudication.  Gastrointestinal: Negative for abdominal pain, blood in stool,  constipation, diarrhea, heartburn, melena, nausea and vomiting.  Genitourinary: Negative for dysuria, flank pain, frequency, hematuria and urgency.  Musculoskeletal: Negative for back pain, joint pain and myalgias.  Skin: Negative for rash.  Neurological: Negative for dizziness, tingling, focal weakness, seizures, weakness and headaches.  Endo/Heme/Allergies: Does not bruise/bleed easily.  Psychiatric/Behavioral: Negative for depression and suicidal ideas. The patient does not have insomnia.       Allergies  Allergen Reactions  . Levaquin [Levofloxacin In D5w] Rash     Past Medical History:  Diagnosis Date  . Atrial flutter ([place])   . COPD (chronic obstructive pulmonary disease) ([place])   . Diabetes mellitus without complication ([place])   . Hyperlipidemia   . Hypertension   . Polycythemia      Past Surgical History:  Procedure Laterality Date  . ELECTROPHYSIOLOGIC STUDY N/A 10/27/2016   Procedure: A-Flutter Ablation;  Surgeon: Deboraha Sprang, MD;  Location: [facility];  Service: Cardiovascular;  Laterality: N/A;  . TEE WITH CARDIOVERSION  12/16    Social History   Social History  . Marital status: Single    Spouse name: N/A  . Number of children: N/A  . Years of education: N/A   Occupational History  . Not on file.   Social History Main Topics  . Smoking status: Former Smoker    Packs/day: 1.50    Years: 45.00    Types: Cigarettes    Quit date: 11/27/2012  . Smokeless tobacco: Never Used  . Alcohol use Yes     Comment: beer every once a while once a month  . Drug use: No  . Sexual activity: Not on file   Other Topics Concern  . Not on file   Social History  Narrative  . No narrative on file    Family History  Problem Relation Age of Onset  . Hypertension Father      Current Outpatient Prescriptions:  .  apixaban (ELIQUIS) 5 MG TABS tablet, Take 1 tablet (5 mg total) by mouth 2 (two) times daily., Disp: 180 tablet, Rfl: 3 .   budesonide-formoterol (SYMBICORT) 80-4.5 MCG/ACT inhaler, Inhale 1 puff into the lungs 2 times daily at 12 noon and 4 pm., Disp: 1 Inhaler, Rfl: 0 .  Cyanocobalamin 1000 MCG TBCR, Take 1,000 mcg by mouth daily., Disp: , Rfl:  .  diltiazem (CARDIZEM CD) 180 MG 24 hr capsule, Take 1 capsule (180 mg total) by mouth daily., Disp: 90 capsule, Rfl: 3 .  glucose blood (FREESTYLE LITE) test strip, Check blood sugar 2x a day as directed, Disp: , Rfl:  .  metFORMIN (GLUCOPHAGE) 1000 MG tablet, Take 1 tablet (1,000 mg total) by mouth 2 (two) times daily with a meal., Disp: 180 tablet, Rfl: 0 .  metoprolol succinate (TOPROL-XL) 50 MG 24 hr tablet, Take 1 tablet (50 mg total) by mouth daily. Take with or immediately following a meal., Disp: 90 tablet, Rfl: 3 .  rosuvastatin (CRESTOR) 40 MG tablet, TAKE ONE TABLET BY MOUTH ONCE DAILY, Disp: 30 tablet, Rfl: 3  Physical exam:  Vitals:   06/23/17 0932 06/23/17 0936  BP: 127/81   Pulse: 73   Resp: 18   Temp: 97.6 F (36.4 C)   TempSrc: Tympanic   SpO2:  97%  Weight: 197 lb 9.6 oz (89.6 kg)   Height: 5\' 7"  (1.702 m)    Physical Exam  Constitutional: He is oriented to person, place, and time and well-developed, well-nourished, and in no distress.  HENT:  Head: Normocephalic and atraumatic.  Eyes: Pupils are equal, round, and reactive to light. EOM are normal.  Neck: Normal range of motion.  Cardiovascular: Normal rate, regular rhythm and normal heart sounds.   Pulmonary/Chest: Effort normal and breath sounds normal.  Abdominal: Soft. Bowel sounds are normal.  Neurological: He is alert and oriented to person, place, and time.  Skin: Skin is warm and dry.     CMP Latest Ref Rng & Units 10/28/2016  Glucose 65 - 99 mg/dL 141(H)  BUN 6 - 20 mg/dL 31(H)  Creatinine 0.61 - 1.24 mg/dL 2.27(H)  Sodium 135 - 145 mmol/L 136  Potassium 3.5 - 5.1 mmol/L 4.6  Chloride 101 - 111 mmol/L 103  CO2 22 - 32 mmol/L 27  Calcium 8.9 - 10.3 mg/dL 8.5(L)  Total  Protein 6.4 - 8.2 g/dL -  Total Bilirubin 0.2 - 1.0 mg/dL -  Alkaline Phos Unit/L -  AST 15 - 37 Unit/L -  ALT U/L -   CBC Latest Ref Rng & Units 06/23/2017  WBC 3.8 - 10.6 K/uL 5.8  Hemoglobin 13.0 - 18.0 g/dL 14.2  Hematocrit 40.0 - 52.0 % 42.2  Platelets 150 - 440 K/uL 137(L)      Assessment and plan- Patient is a 71 y.o. male referred for immature cells found in peripheral blood difefrential  Repeat cbc today is normal except for mild thrombocytopenia which is intermittent and stable. Wbc is normal with normal differential. Polycythemia has resolved. Patient can continue to f/u with Dr. Lavera Guise his pcp at this point and does not require hematology follow up at this time. JAK2 testign is pending and if abnormal, we will call the patient. He can be referred to [place] in the future if questions or concerns arise  Visit Diagnosis 1. Abnormal blood smear      Dr. Randa Evens, MD, MPH [facility] Pager- [phone] 06/23/2017 9:49 AM

## 2017-06-23 NOTE — Progress Notes (Signed)
No c/o o2 sats good, no HA or strokes.

## 2017-07-01 ENCOUNTER — Other Ambulatory Visit: Payer: Self-pay | Admitting: Cardiovascular Disease

## 2017-07-04 LAB — JAK2  V617F QUAL. WITH REFLEX TO EXON 12

## 2017-07-04 LAB — JAK2 EXONS 12-15

## 2017-07-05 DIAGNOSIS — R809 Proteinuria, unspecified: Secondary | ICD-10-CM | POA: Diagnosis not present

## 2017-07-05 DIAGNOSIS — N183 Chronic kidney disease, stage 3 (moderate): Secondary | ICD-10-CM | POA: Diagnosis not present

## 2017-07-05 DIAGNOSIS — E1129 Type 2 diabetes mellitus with other diabetic kidney complication: Secondary | ICD-10-CM | POA: Diagnosis not present

## 2017-07-06 DIAGNOSIS — E119 Type 2 diabetes mellitus without complications: Secondary | ICD-10-CM | POA: Diagnosis not present

## 2017-07-11 DIAGNOSIS — J449 Chronic obstructive pulmonary disease, unspecified: Secondary | ICD-10-CM | POA: Diagnosis not present

## 2017-07-12 DIAGNOSIS — E781 Pure hyperglyceridemia: Secondary | ICD-10-CM | POA: Diagnosis not present

## 2017-07-12 DIAGNOSIS — E538 Deficiency of other specified B group vitamins: Secondary | ICD-10-CM | POA: Diagnosis not present

## 2017-07-12 DIAGNOSIS — N183 Chronic kidney disease, stage 3 (moderate): Secondary | ICD-10-CM | POA: Diagnosis not present

## 2017-07-12 DIAGNOSIS — E785 Hyperlipidemia, unspecified: Secondary | ICD-10-CM | POA: Diagnosis not present

## 2017-07-12 DIAGNOSIS — E1122 Type 2 diabetes mellitus with diabetic chronic kidney disease: Secondary | ICD-10-CM | POA: Diagnosis not present

## 2017-07-12 DIAGNOSIS — E279 Disorder of adrenal gland, unspecified: Secondary | ICD-10-CM | POA: Diagnosis not present

## 2017-07-14 ENCOUNTER — Other Ambulatory Visit: Payer: Self-pay | Admitting: Orthopedic Surgery

## 2017-07-14 DIAGNOSIS — R351 Nocturia: Secondary | ICD-10-CM | POA: Diagnosis not present

## 2017-07-14 DIAGNOSIS — N401 Enlarged prostate with lower urinary tract symptoms: Secondary | ICD-10-CM | POA: Diagnosis not present

## 2017-07-14 DIAGNOSIS — R3129 Other microscopic hematuria: Secondary | ICD-10-CM

## 2017-07-14 DIAGNOSIS — R350 Frequency of micturition: Secondary | ICD-10-CM | POA: Diagnosis not present

## 2017-07-20 ENCOUNTER — Other Ambulatory Visit: Payer: Self-pay | Admitting: Urology

## 2017-07-20 DIAGNOSIS — R3129 Other microscopic hematuria: Secondary | ICD-10-CM

## 2017-07-22 ENCOUNTER — Ambulatory Visit: Payer: PPO

## 2017-07-25 DIAGNOSIS — N183 Chronic kidney disease, stage 3 (moderate): Secondary | ICD-10-CM | POA: Diagnosis not present

## 2017-07-25 DIAGNOSIS — E1122 Type 2 diabetes mellitus with diabetic chronic kidney disease: Secondary | ICD-10-CM | POA: Diagnosis not present

## 2017-07-25 DIAGNOSIS — R809 Proteinuria, unspecified: Secondary | ICD-10-CM | POA: Diagnosis not present

## 2017-07-25 DIAGNOSIS — I129 Hypertensive chronic kidney disease with stage 1 through stage 4 chronic kidney disease, or unspecified chronic kidney disease: Secondary | ICD-10-CM | POA: Diagnosis not present

## 2017-07-26 ENCOUNTER — Ambulatory Visit: Payer: PPO

## 2017-07-28 ENCOUNTER — Ambulatory Visit: Admission: RE | Admit: 2017-07-28 | Payer: PPO | Source: Ambulatory Visit

## 2017-08-02 ENCOUNTER — Ambulatory Visit: Payer: PPO | Admitting: Internal Medicine

## 2017-08-04 ENCOUNTER — Ambulatory Visit
Admission: RE | Admit: 2017-08-04 | Discharge: 2017-08-04 | Disposition: A | Discharge status: Home / Self Care | Payer: PPO | Source: Ambulatory Visit | Attending: Urology | Admitting: Urology

## 2017-08-04 DIAGNOSIS — I700 Atherosclerosis of aorta: Secondary | ICD-10-CM | POA: Insufficient documentation

## 2017-08-04 DIAGNOSIS — R937 Abnormal findings on diagnostic imaging of other parts of musculoskeletal system: Secondary | ICD-10-CM | POA: Diagnosis not present

## 2017-08-04 DIAGNOSIS — R935 Abnormal findings on diagnostic imaging of other abdominal regions, including retroperitoneum: Secondary | ICD-10-CM | POA: Diagnosis not present

## 2017-08-04 DIAGNOSIS — D3502 Benign neoplasm of left adrenal gland: Secondary | ICD-10-CM | POA: Insufficient documentation

## 2017-08-04 DIAGNOSIS — K869 Disease of pancreas, unspecified: Secondary | ICD-10-CM | POA: Diagnosis not present

## 2017-08-04 DIAGNOSIS — R3129 Other microscopic hematuria: Secondary | ICD-10-CM | POA: Insufficient documentation

## 2017-08-05 ENCOUNTER — Ambulatory Visit (INDEPENDENT_AMBULATORY_CARE_PROVIDER_SITE_OTHER): Payer: PPO | Admitting: Internal Medicine

## 2017-08-05 ENCOUNTER — Encounter: Payer: Self-pay | Admitting: Internal Medicine

## 2017-08-05 VITALS — BP 120/78 | HR 122 | Resp 16 | Ht 67.0 in | Wt 199.0 lb

## 2017-08-05 DIAGNOSIS — J9611 Chronic respiratory failure with hypoxia: Secondary | ICD-10-CM | POA: Diagnosis not present

## 2017-08-05 NOTE — Patient Instructions (Addendum)
Continue oxygen as prescribed  continue inhalers as prescribed-will prescribe Symbicort only at this time Follow-up in 6 months  Stop smoking

## 2017-08-05 NOTE — Progress Notes (Signed)
MRN# [id number] [place] [name] [birth year]   CC: Chief Complaint  Patient presents with  . COPD      Brief History: HPI 01/03/15 -71 year old male with a past medical history significant for atrial flutter, COPD, hypertension referred by Dr.Golan for COPD optimization and preop pulmonary evaluation prior to scheduled cardioversion for atrial flutter one year ago   Events since last clinic visit: Patient presents today for follow-up visit of his COPD.  Overall he states he is doing well.   Has intermittent SOB but COPD seems to be well controlled Uses oxygen at night 2.5 l [place]  No distress at this time No signs of infection at this time  Patient was supposed to stop Spiriva however he forgot so the plan is to wean down to 1 inhaler at this time Patient still smoking cigarettes  Medication:    Current Outpatient Prescriptions:  .  apixaban (ELIQUIS) 5 MG TABS tablet, Take 1 tablet (5 mg total) by mouth 2 (two) times daily., Disp: 180 tablet, Rfl: 3 .  budesonide-formoterol (SYMBICORT) 80-4.5 MCG/ACT inhaler, Inhale 1 puff into the lungs 2 times daily at 12 noon and 4 pm., Disp: 1 Inhaler, Rfl: 0 .  Cobalamine Combinations (VITAMIN B12-FOLIC ACID) 250-539 MCG TABS, Take 2 tablets by mouth daily., Disp: , Rfl:  .  diltiazem (CARDIZEM CD) 180 MG 24 hr capsule, Take 1 capsule (180 mg total) by mouth daily., Disp: 90 capsule, Rfl: 3 .  glucose blood (FREESTYLE LITE) test strip, Check blood sugar 2x a day as directed, Disp: , Rfl:  .  metFORMIN (GLUCOPHAGE) 1000 MG tablet, Take 1 tablet (1,000 mg total) by mouth 2 (two) times daily with a meal., Disp: 180 tablet, Rfl: 0 .  metoprolol succinate (TOPROL-XL) 50 MG 24 hr tablet, Take 1 tablet (50 mg total) by mouth daily. Take with or immediately following a meal., Disp: 90 tablet, Rfl: 3 .  rosuvastatin (CRESTOR) 40 MG tablet, TAKE 1 TABLET BY MOUTH ONCE DAILY, Disp: 90 tablet, Rfl: 2\   Review of Systems: Gen:  Denies  fever, sweats,  chills HEENT: Denies blurred vision, double vision, ear pain, eye pain, hearing loss, nose bleeds, sore throat Cvc:  No dizziness, chest pain or heaviness Resp:   Admits to: Mild shortness of breath with exertion, this is chronic, mild productive whitish sputum over the past 3 days Gi: Denies swallowing difficulty, stomach pain, nausea or vomiting, diarrhea, constipation, bowel incontinence Other:  All other systems negative  Allergies:  Levaquin [levofloxacin in d5w]  Physical Examination:  BP 120/78 (BP Location: Left Arm, Cuff Size: Normal)   Pulse (!) 122   Resp 16   Ht 5\' 7"  (1.702 m)   Wt 199 lb (90.3 kg)   SpO2 95%   BMI 31.17 kg/m   General Appearance: No distress  HEENT: PERRLA, no ptosis, no other lesions noticed Pulmonary:normal breath sounds., diaphragmatic excursion normal.No wheezing, No rales   Cardiovascular:  Normal S1,S2.  No m/r/g.     Abdomen:Exam: Benign, Soft, non-tender, No masses  Skin:   warm, no rashes, no ecchymosis  Extremities: normal, no cyanosis, clubbing, warm with normal capillary refill.     Pulmonary function testing 05/07/2015 FEV1 62% FEV1/FVC 57% RV 136 TLC 93 RV/TLC 144 DLCO uncorrected 67% Impression: Moderate obstruction with no significant broncho-dilator response. Moderate decrease in DLCO.  Coronary function testing 08/18/2016 FEV1 40% FEV1/FVC 58% RV 170 TLC 95 RV/TLC 175 DLCO 58 Impression: Moderate-severe obstruction with minor bronchodilator response, 14% change in FVC,  moderate decrease in DLCO 6MWT - total distance 1181 feet/360 m, low saturation 92%, highest heart rate 64; dyspnea score at the end of test is 0.   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 So Will plan stop Spiriva and assess resp status at next visit -continue Symbicort and albuterol as needed   2.Review of PFT's Pulmonary function testing 05/07/2015: FEV1 60%, FEV1/FVC 57%. Moderate obstruction and a +Bronchdilator response, moderate decrease in DLCO. PFT 08/24/16: FEV1 48%, FEV1/FVC: 58% - significant for  mod-severe obstruction -   3.SOB-likely from COPD  4.Chronic Hypoxic resp failure -Patient benefits from oxygen therapy 2.5L [place] at night -recommend using oxygen as prescribed -patient needs this for survival   5. Stop smoking Patient advised and counseled on smoking cessation for approximately 5 minutes  Follow up in 6 months  Patient satisfied with Plan of action and management. All questions answered  Corrin Parker, M.D.  [facility]  Medical Director [place] Director [facility] Cardio-Pulmonary Department

## 2017-08-11 DIAGNOSIS — N401 Enlarged prostate with lower urinary tract symptoms: Secondary | ICD-10-CM | POA: Diagnosis not present

## 2017-08-11 DIAGNOSIS — J449 Chronic obstructive pulmonary disease, unspecified: Secondary | ICD-10-CM | POA: Diagnosis not present

## 2017-08-11 DIAGNOSIS — R3129 Other microscopic hematuria: Secondary | ICD-10-CM | POA: Diagnosis not present

## 2017-09-02 NOTE — Progress Notes (Signed)
Cardiology Office Note  Date:  09/06/2017   ID:  [name], [name] 19-Jan-1946, MRN [id number]  PCP:  [name], MD   Chief Complaint  Patient presents with  . other    6 month follow up. Meds reviewed by the pt.'s bottles. "doing well."     HPI:  [name] is a 71 year old gentleman, patient of Dr. Rebecka Apley, with   hypertension,  diabetes,  COPD,  long smoking history, polycythemia,   tachycardia.  Cardiology evaluated the patient 10/29/2014.  EKG showed atrial flutter with heart rate 140s up to 150.  long smoking history for 45 years.  started on beta blockers, diltiazem, digoxin, eventually started on amiodarone infusion with anticoagulation, eliquis. He had TEE and cardioversion on 10/30/2014. Normal sinus rhythm was successfully Cardioversion February 2016 Flutter April 2018 converted on his own within 1 week Ablation October 2017 He presents for follow-up of his atrial flutter  catheter ablation for his flutter October 2017 Age-60, DM-1 HTN-1  CHADSVASc 3 Metoprolol dose was increased He is on anticoagulation. 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 Eddie Dibbles Compliant with his inhalers  Other past medical history  reviewed  total cholesterol 167, LDL 100, hemoglobin A1c 7.7  Previous cardioversion on 01/07/2015 which successfully restored normal sinus rhythm  TEE report showed ejection fraction 45-50%, right and left atrium mildly enlarged, RV mildly dilated with mildly reduced systolic function, small amount of plaque in the aorta  CT scan of the chest 10/29/2014 in the hospital showed bilateral predominantly lower lobe pulmonary opacity most consistent with pneumonia   PMH:   has a past medical history of Atrial flutter ([place]); COPD (chronic obstructive pulmonary disease) ([place]); Diabetes mellitus without complication ([place]); Hyperlipidemia; Hypertension; and Polycythemia.  PSH:    Past Surgical History:  Procedure Laterality Date  . ELECTROPHYSIOLOGIC STUDY N/A 10/27/2016   Procedure: A-Flutter Ablation;  Surgeon: Deboraha Sprang, MD;  Location: [place] [facility];  Service: Cardiovascular;  Laterality: N/A;  . TEE WITH CARDIOVERSION  12/16    Current Outpatient Prescriptions  Medication Sig Dispense Refill  . apixaban (ELIQUIS) 5 MG TABS tablet Take 1 tablet (5 mg total) by mouth 2 (two) times daily. 180 tablet 3  . budesonide-formoterol (SYMBICORT) 80-4.5 MCG/ACT inhaler Inhale 1 puff into the lungs 2 times daily at 12 noon and 4 pm. 1 Inhaler 0  . Cobalamine Combinations (VITAMIN B12-FOLIC ACID) 762-831 MCG TABS Take 2 tablets by mouth daily.    Marland Kitchen diltiazem (CARDIZEM CD) 180 MG 24 hr capsule Take 1 capsule (180 mg total) by mouth daily. 90 capsule 3  . glucose blood (FREESTYLE LITE) test strip Check blood sugar 2x a day as directed    . losartan (COZAAR) 25 MG tablet Take 25  mg by mouth daily.    . metFORMIN (GLUCOPHAGE) 1000 MG tablet Take 1 tablet (1,000 mg total) by mouth 2 (two) times daily with a meal. 180 tablet 0  . metoprolol succinate (TOPROL-XL) 50 MG 24 hr tablet Take 1 tablet (50 mg total) by mouth daily. Take with or immediately following a meal. 90 tablet 3  . rosuvastatin  (CRESTOR) 40 MG tablet TAKE 1 TABLET BY MOUTH ONCE DAILY 90 tablet 2  . SPIRIVA HANDIHALER 18 MCG inhalation capsule Place 18 mcg into inhaler and inhale daily.     No current facility-administered medications for this visit.      Allergies:   Levaquin [levofloxacin in d5w]   Social History:  The patient  reports that he quit smoking about 4 years ago. His smoking use included Cigarettes. He has a 67.50 pack-year smoking history. He has never used smokeless tobacco. He reports that he drinks alcohol. He reports that he does not use drugs.   Family History:   family history includes Hypertension in his father.    Review of Systems: Review of Systems  Constitutional: Negative.   Respiratory: Positive for shortness of breath.   Cardiovascular: Negative.   Gastrointestinal: Negative.   Musculoskeletal: Negative.   Neurological: Negative.   Psychiatric/Behavioral: Negative.   All other systems reviewed and are negative.    PHYSICAL EXAM: VS:  BP 130/80 (BP Location: Left Arm, Patient Position: Sitting, Cuff Size: Normal)   Pulse (!) 128   Ht 5\' 8"  (1.727 m)   Wt 197 lb 8 oz (89.6 kg)   BMI 30.03 kg/m  , BMI Body mass index is 30.03 kg/m. GEN: Well nourished, well developed, in no acute distress  HEENT: normal  Neck: no JVD, carotid bruits, or masses Cardiac: Regular rhythm, tachycardic no murmurs, rubs, or gallops,no edema  Respiratory:  Moderately decreased breath sounds throughout, normal work of breathing GI: soft, nontender, nondistended, + BS MS: no deformity or atrophy  Skin: warm and dry, no rash Neuro:  Strength and sensation are intact Psych: euthymic mood, full affect    Recent Labs: 10/28/2016: BUN 31; Creatinine, Ser 2.27; Potassium 4.6; Sodium 136 11/25/2016: TSH 2.800 06/23/2017: Hemoglobin 14.2; Platelets 137    Lipid Panel No results found for: CHOL, HDL, LDLCALC, TRIG    Wt Readings from Last 3 Encounters:  09/06/17 197 lb 8 oz (89.6 kg)  08/05/17  199 lb (90.3 kg)  06/23/17 197 lb 9.6 oz (89.6 kg)       ASSESSMENT AND PLAN:   Essential hypertension -  We will continue current medications including metoprolol and diltiazem  Coronary artery disease involving native coronary artery of native heart with angina pectoris ([place]) - Plan: EKG 12-Lead, CANCELED: EKG 12-Lead Currently with no symptoms of angina. No further workup at this time. Continue current medication regimen.  We will work on rate control/rhythm control  Typical atrial flutter ([place]) - Plan: EKG 12-Lead Will schedule for cardioversion as his work schedule permits He does not want cardioversion on Monday Wednesday Fridays as he is working We will try for a Thursday will need to double book Continue current medications, Case discussed with Dr. Caryl Comes  Centrilobular emphysema [facility]) Stopped smoking many years ago, maintained on his inhalers Decreased air movement on physical exam  Uncontrolled type 2 diabetes mellitus with other circulatory complication, without long-term current use of insulin ([place]) We have encouraged  careful diet management in an effort to lose weight.   Total encounter time more than 45 minutes  Greater than 50% was spent in counseling and coordination of care with the patient  Risk and benefit of the procedure/cardioversion discussed with him in detail Instructions provided Disposition:   F/U  1 month   Orders Placed This Encounter  Procedures  . EKG 12-Lead     Signed, Esmond Plants, M.D., Ph.D. 09/06/2017  [place], [place]

## 2017-09-06 ENCOUNTER — Telehealth: Payer: Self-pay | Admitting: Cardiovascular Disease

## 2017-09-06 ENCOUNTER — Ambulatory Visit (INDEPENDENT_AMBULATORY_CARE_PROVIDER_SITE_OTHER): Payer: PPO | Admitting: Cardiovascular Disease

## 2017-09-06 ENCOUNTER — Encounter: Payer: Self-pay | Admitting: Cardiovascular Disease

## 2017-09-06 VITALS — BP 130/80 | HR 128 | Ht 68.0 in | Wt 197.5 lb

## 2017-09-06 DIAGNOSIS — I2510 Atherosclerotic heart disease of native coronary artery without angina pectoris: Secondary | ICD-10-CM

## 2017-09-06 DIAGNOSIS — J432 Centrilobular emphysema: Secondary | ICD-10-CM

## 2017-09-06 DIAGNOSIS — Z87891 Personal history of nicotine dependence: Secondary | ICD-10-CM | POA: Diagnosis not present

## 2017-09-06 DIAGNOSIS — I25118 Atherosclerotic heart disease of native coronary artery with other forms of angina pectoris: Secondary | ICD-10-CM | POA: Diagnosis not present

## 2017-09-06 DIAGNOSIS — I10 Essential (primary) hypertension: Secondary | ICD-10-CM | POA: Diagnosis not present

## 2017-09-06 DIAGNOSIS — I483 Typical atrial flutter: Secondary | ICD-10-CM

## 2017-09-06 NOTE — Telephone Encounter (Signed)
Spoke with patient and confirmed cardioversion scheduled for Thursday 09/08/17 at 09:00AM with arrival time of 08:00AM. He confirmed appointment information with no further questions at this time.

## 2017-09-06 NOTE — Patient Instructions (Addendum)
Medication Instructions:   No medication changes made  Labwork:  No new labs needed  Testing/Procedures:  No further testing at this time  You are scheduled for a Cardioversion on ________________ with Dr.__Gollan__ Please arrive at the [facility] at _________ a.m. on the day of your procedure.  DIET INSTRUCTIONS:  Nothing to eat or drink after midnight except your medications with a small sip of water.     1) HOLD Metformin that morning.  2) Medications:  YOU MAY TAKE ALL of your remaining medications with a small amount of water.  3) Must have a responsible person to drive you home.  4) Bring a current list of your medications and current insurance cards.    If you have any questions after you get home, please call the office at [phone]    Follow-Up: It was a pleasure seeing you in the office today. Please call us if you have new issues that need to be addressed before your next appt.  [phone]  Your physician wants you to follow-up in: 1 month.    If you need a refill on your cardiac medications before your next appointment, please call your pharmacy.

## 2017-09-07 ENCOUNTER — Other Ambulatory Visit: Payer: Self-pay | Admitting: Cardiovascular Disease

## 2017-09-07 LAB — CBC WITH DIFFERENTIAL/PLATELET
Basophils Absolute: 0 10*3/uL (ref 0.0–0.2)
Basos: 0 %
EOS (ABSOLUTE): 0.2 10*3/uL (ref 0.0–0.4)
EOS: 2 %
HEMATOCRIT: 47.2 % (ref 37.5–51.0)
HEMOGLOBIN: 15.4 g/dL (ref 13.0–17.7)
Immature Grans (Abs): 0.2 10*3/uL — ABNORMAL HIGH (ref 0.0–0.1)
Immature Granulocytes: 3 %
LYMPHS ABS: 0.6 10*3/uL — AB (ref 0.7–3.1)
Lymphs: 9 %
MCH: 32.1 pg (ref 26.6–33.0)
MCHC: 32.6 g/dL (ref 31.5–35.7)
MCV: 98 fL — AB (ref 79–97)
MONOS ABS: 0.9 10*3/uL (ref 0.1–0.9)
Monocytes: 12 %
NEUTROS ABS: 5.2 10*3/uL (ref 1.4–7.0)
Neutrophils: 74 %
Platelets: 112 10*3/uL — ABNORMAL LOW (ref 150–379)
RBC: 4.8 x10E6/uL (ref 4.14–5.80)
RDW: 12.3 % (ref 12.3–15.4)
WBC: 7 10*3/uL (ref 3.4–10.8)

## 2017-09-07 LAB — BASIC METABOLIC PANEL
BUN / CREAT RATIO: 10 (ref 10–24)
BUN: 19 mg/dL (ref 8–27)
CHLORIDE: 103 mmol/L (ref 96–106)
CO2: 22 mmol/L (ref 20–29)
CREATININE: 1.92 mg/dL — AB (ref 0.76–1.27)
Calcium: 9.4 mg/dL (ref 8.6–10.2)
GFR calc non Af Amer: 34 mL/min/{1.73_m2} — ABNORMAL LOW (ref >59)
GFR, EST AFRICAN AMERICAN: 39 mL/min/{1.73_m2} — AB (ref >59)
GLUCOSE: 139 mg/dL — AB (ref 65–99)
Potassium: 4.5 mmol/L (ref 3.5–5.2)
SODIUM: 142 mmol/L (ref 134–144)

## 2017-09-07 LAB — PROTIME-INR
INR: 1.1 (ref 0.8–1.2)
PROTHROMBIN TIME: 11.2 s (ref 9.1–12.0)

## 2017-09-07 NOTE — OR Nursing (Signed)
Left message from pam to remind dr. Rockey Situ to enter cardioversion orders for procedure tomorrow

## 2017-09-08 ENCOUNTER — Encounter
Admission: RE | Disposition: A | Discharge status: Home / Self Care | Payer: Self-pay | Source: Ambulatory Visit | Attending: Cardiovascular Disease

## 2017-09-08 ENCOUNTER — Ambulatory Visit
Admission: RE | Admit: 2017-09-08 | Discharge: 2017-09-08 | Disposition: A | Discharge status: Home / Self Care | Payer: PPO | Source: Ambulatory Visit | Attending: Cardiovascular Disease | Admitting: Cardiovascular Disease

## 2017-09-08 ENCOUNTER — Encounter: Payer: Self-pay | Admitting: Emergency Medicine

## 2017-09-08 ENCOUNTER — Ambulatory Visit: Payer: PPO | Admitting: Registered Nurse

## 2017-09-08 DIAGNOSIS — Z7901 Long term (current) use of anticoagulants: Secondary | ICD-10-CM | POA: Diagnosis not present

## 2017-09-08 DIAGNOSIS — E1169 Type 2 diabetes mellitus with other specified complication: Secondary | ICD-10-CM | POA: Diagnosis not present

## 2017-09-08 DIAGNOSIS — I10 Essential (primary) hypertension: Secondary | ICD-10-CM | POA: Diagnosis not present

## 2017-09-08 DIAGNOSIS — J432 Centrilobular emphysema: Secondary | ICD-10-CM | POA: Diagnosis not present

## 2017-09-08 DIAGNOSIS — Z87891 Personal history of nicotine dependence: Secondary | ICD-10-CM | POA: Diagnosis not present

## 2017-09-08 DIAGNOSIS — E1165 Type 2 diabetes mellitus with hyperglycemia: Secondary | ICD-10-CM | POA: Insufficient documentation

## 2017-09-08 DIAGNOSIS — I2510 Atherosclerotic heart disease of native coronary artery without angina pectoris: Secondary | ICD-10-CM | POA: Insufficient documentation

## 2017-09-08 DIAGNOSIS — E785 Hyperlipidemia, unspecified: Secondary | ICD-10-CM | POA: Insufficient documentation

## 2017-09-08 DIAGNOSIS — I4891 Unspecified atrial fibrillation: Secondary | ICD-10-CM | POA: Diagnosis not present

## 2017-09-08 DIAGNOSIS — I4892 Unspecified atrial flutter: Secondary | ICD-10-CM | POA: Diagnosis not present

## 2017-09-08 DIAGNOSIS — I483 Typical atrial flutter: Secondary | ICD-10-CM

## 2017-09-08 DIAGNOSIS — Z7984 Long term (current) use of oral hypoglycemic drugs: Secondary | ICD-10-CM | POA: Diagnosis not present

## 2017-09-08 DIAGNOSIS — Z79899 Other long term (current) drug therapy: Secondary | ICD-10-CM | POA: Insufficient documentation

## 2017-09-08 DIAGNOSIS — J449 Chronic obstructive pulmonary disease, unspecified: Secondary | ICD-10-CM | POA: Diagnosis not present

## 2017-09-08 DIAGNOSIS — E119 Type 2 diabetes mellitus without complications: Secondary | ICD-10-CM | POA: Diagnosis not present

## 2017-09-08 HISTORY — PX: CARDIOVERSION: EP1203

## 2017-09-08 LAB — GLUCOSE, CAPILLARY: GLUCOSE-CAPILLARY: 158 mg/dL — AB (ref 65–99)

## 2017-09-08 SURGERY — CARDIOVERSION (CATH LAB)
Anesthesia: General

## 2017-09-08 MED ORDER — PROPOFOL 10 MG/ML IV BOLUS
INTRAVENOUS | Status: DC | PRN
Start: 2017-09-08 — End: 2017-09-08
  Administered 2017-09-08: 70 mg via INTRAVENOUS

## 2017-09-08 MED ORDER — SODIUM CHLORIDE 0.9 % IV SOLN
INTRAVENOUS | Status: DC | PRN
  Administered 2017-09-08: 08:00:00 via INTRAVENOUS

## 2017-09-08 NOTE — CV Procedure (Signed)
Cardioversion procedure note For atrial flutter, typical  Procedure Details:  Consent: Risks of procedure as well as the alternatives and risks of each were explained to the (patient/caregiver). Consent for procedure obtained.  Time Out: Verified patient identification, verified procedure, site/side was marked, verified correct patient position, special equipment/implants available, medications/allergies/relevent history reviewed, required imaging and test results available. Performed  Patient placed on cardiac monitor, pulse oximetry, supplemental oxygen as necessary.  Sedation given: propofol IV, Dr. Adams Pacer pads placed anterior and posterior chest.   Cardioverted 1 time(s).  Cardioverted at  150 J. Synchronized biphasic Converted to NSR   Evaluation: Findings: Post procedure EKG shows: NSR Complications: None Patient did tolerate procedure well.  Time Spent Directly with the Patient:  45 minutes   Tim Aairah Negrette, M.D., Ph.D.  

## 2017-09-08 NOTE — Transfer of Care (Signed)
Immediate Anesthesia Transfer of Care Note  Patient: [name]  Procedure(s) Performed: CARDIOVERSION (N/A )  Patient Location: PACU  Anesthesia Type:General  Level of Consciousness: awake, alert  and oriented  Airway & Oxygen Therapy: Patient Spontanous Breathing and Patient connected to nasal cannula oxygen  Post-op Assessment: Report given to RN and Post -op Vital signs reviewed and stable  Post vital signs: Reviewed and stable  Last Vitals:  Vitals:   09/08/17 0828 09/08/17 0829  BP:  121/79  Pulse: 76 93  Resp: (!) 21 (!) 25  Temp:    SpO2: 94% 95%    Last Pain:  Vitals:   09/08/17 0753  TempSrc: Oral         Complications: No apparent anesthesia complications

## 2017-09-08 NOTE — Discharge Instructions (Signed)
General Anesthesia, Adult, Care After These instructions provide you with information about caring for yourself after your procedure. Your health care provider may also give you more specific instructions. Your treatment has been planned according to current medical practices, but problems sometimes occur. Call your health care provider if you have any problems or questions after your procedure. What can I expect after the procedure? After the procedure, it is common to have:  Vomiting.  A sore throat.  Mental slowness.  It is common to feel:  Nauseous.  Cold or shivery.  Sleepy.  Tired.  Sore or achy, even in parts of your body where you did not have surgery.  Follow these instructions at home: For at least 24 hours after the procedure:  Do not: ? Participate in activities where you could fall or become injured. ? Drive. ? Use heavy machinery. ? Drink alcohol. ? Take sleeping pills or medicines that cause drowsiness. ? Make important decisions or sign legal documents. ? Take care of children on your own.  Rest. Eating and drinking  If you vomit, drink water, juice, or soup when you can drink without vomiting.  Drink enough fluid to keep your urine clear or pale yellow.  Make sure you have little or no nausea before eating solid foods.  Follow the diet recommended by your health care provider. General instructions  Have a responsible adult stay with you until you are awake and alert.  Return to your normal activities as told by your health care provider. Ask your health care provider what activities are safe for you.  Take over-the-counter and prescription medicines only as told by your health care provider.  If you smoke, do not smoke without supervision.  Keep all follow-up visits as told by your health care provider. This is important. Contact a health care provider if:  You continue to have nausea or vomiting at home, and medicines are not helpful.  You  cannot drink fluids or start eating again.  You cannot urinate after 8-12 hours.  You develop a skin rash.  You have fever.  You have increasing redness at the site of your procedure. Get help right away if:  You have difficulty breathing.  You have chest pain.  You have unexpected bleeding.  You feel that you are having a life-threatening or urgent problem. This information is not intended to replace advice given to you by your health care provider. Make sure you discuss any questions you have with your health care provider. Document Released: 02/07/2001 Document Revised: 04/05/2016 Document Reviewed: 10/16/2015 Elsevier Interactive Patient Education  2018 [employer]. Electrical Cardioversion, Care After This sheet gives you information about how to care for yourself after your procedure. Your health care provider may also give you more specific instructions. If you have problems or questions, contact your health care provider. What can I expect after the procedure? After the procedure, it is common to have:  Some redness on the skin where the shocks were given.  Follow these instructions at home:  Do not drive for 24 hours if you were given a medicine to help you relax (sedative).  Take over-the-counter and prescription medicines only as told by your health care provider.  Ask your health care provider how to check your pulse. Check it often.  Rest for 48 hours after the procedure or as told by your health care provider.  Avoid or limit your caffeine use as told by your health care provider. Contact a health care provider if:  You feel like your heart is beating too quickly or your pulse is not regular.  You have a serious muscle cramp that does not go away. Get help right away if:  You have discomfort in your chest.  You are dizzy or you feel faint.  You have trouble breathing or you are short of breath.  Your speech is slurred.  You have trouble moving an  arm or leg on one side of your body.  Your fingers or toes turn cold or blue. This information is not intended to replace advice given to you by your health care provider. Make sure you discuss any questions you have with your health care provider. Document Released: 08/22/2013 Document Revised: 06/04/2016 Document Reviewed: 05/07/2016 Elsevier Interactive Patient Education  [employer].

## 2017-09-08 NOTE — H&P (Signed)
H&P Addendum, pre-cardioversion for atrial flutter  Patient was seen and evaluated prior to -cardioversion procedure Symptoms, prior testing details again confirmed with the patient Patient examined, no significant change from prior exam Lab work reviewed in detail personally by myself Patient understands risk and benefit of the procedure, willing to proceed  Signed, Esmond Plants, MD, Ph.D [facility]

## 2017-09-08 NOTE — Progress Notes (Signed)
Pt sitting up in bed drinking w/o difficulty in NAD, Dr. Rockey Situ at bedside earlier to discuss procedure with pt and family.  Discharge and follow up instructions reviewed with pt and family who verbalize understanding.

## 2017-09-08 NOTE — Anesthesia Preprocedure Evaluation (Signed)
Anesthesia Evaluation  Patient identified by MRN, date of birth, ID band Patient awake    Reviewed: Allergy & Precautions, H&P , NPO status , Patient's Chart, lab work & pertinent test results, reviewed documented beta blocker date and time   Airway Mallampati: II   Neck ROM: full    Dental  (+) Poor Dentition, Teeth Intact   Pulmonary neg pulmonary ROS, COPD,  COPD inhaler, former smoker,    Pulmonary exam normal        Cardiovascular Exercise Tolerance: Poor hypertension, On Medications + CAD  negative cardio ROS Normal cardiovascular examAtrial Fibrillation  Rhythm:regular Rate:Normal     Neuro/Psych negative neurological ROS  negative psych ROS   GI/Hepatic negative GI ROS, Neg liver ROS,   Endo/Other  negative endocrine ROSdiabetes  Renal/GU negative Renal ROS  negative genitourinary   Musculoskeletal   Abdominal   Peds  Hematology negative hematology ROS (+)   Anesthesia Other Findings Past Medical History: No date: Atrial flutter ([facility]) No date: COPD (chronic obstructive pulmonary disease) ([facility]) No date: Diabetes mellitus without complication ([facility]) No date: Hyperlipidemia No date: Hypertension No date: Polycythemia Past Surgical History: 10/27/2016: ELECTROPHYSIOLOGIC STUDY; N/A     Comment:  Procedure: A-Flutter Ablation;  Surgeon: Deboraha Sprang,              MD;  Location: [place] [facility];  Service:               Cardiovascular;  Laterality: N/A; 12/16: TEE WITH CARDIOVERSION BMI    Body Mass Index:  30.03 kg/m     Reproductive/Obstetrics negative OB ROS                             Anesthesia Physical Anesthesia Plan  ASA: III  Anesthesia Plan: General   Post-op Pain Management:    Induction:   PONV Risk Score and Plan:   Airway Management Planned:   Additional Equipment:   Intra-op Plan:   Post-operative Plan:   Informed Consent: I have reviewed  the patients History and Physical, chart, labs and discussed the procedure including the risks, benefits and alternatives for the proposed anesthesia with the patient or authorized representative who has indicated his/her understanding and acceptance.   Dental Advisory Given  Plan Discussed with: CRNA  Anesthesia Plan Comments:         Anesthesia Quick Evaluation

## 2017-09-08 NOTE — Anesthesia Postprocedure Evaluation (Signed)
Anesthesia Post Note  Patient: [name]  Procedure(s) Performed: CARDIOVERSION (N/A )  Patient location during evaluation: PACU Anesthesia Type: General Level of consciousness: awake and alert Pain management: pain level controlled Vital Signs Assessment: post-procedure vital signs reviewed and stable Respiratory status: spontaneous breathing, nonlabored ventilation, respiratory function stable and patient connected to nasal cannula oxygen Cardiovascular status: blood pressure returned to baseline and stable Postop Assessment: no apparent nausea or vomiting Anesthetic complications: no     Last Vitals:  Vitals:   09/08/17 0845 09/08/17 0900  BP: 117/85 120/78  Pulse: 89 92  Resp: 20 20  Temp:    SpO2: 95% 95%    Last Pain:  Vitals:   09/08/17 0830  TempSrc:   PainSc: [place]

## 2017-09-08 NOTE — Anesthesia Post-op Follow-up Note (Signed)
Anesthesia QCDR form completed.        

## 2017-09-09 ENCOUNTER — Ambulatory Visit: Payer: PPO | Admitting: Cardiovascular Disease

## 2017-09-09 ENCOUNTER — Encounter: Payer: Self-pay | Admitting: Cardiovascular Disease

## 2017-09-09 ENCOUNTER — Telehealth: Payer: Self-pay | Admitting: Oncology

## 2017-09-09 NOTE — Telephone Encounter (Signed)
Schd appt with MD for next week, per 09/08/17 schd msg/Dr Janese Banks. Appt conf with patient

## 2017-09-10 DIAGNOSIS — J449 Chronic obstructive pulmonary disease, unspecified: Secondary | ICD-10-CM | POA: Diagnosis not present

## 2017-09-12 NOTE — Progress Notes (Signed)
Hematology/Oncology Consult note [facility]  Telephone:([phone] [phone]  Patient Care Team: [name], MD as PCP - General (Cardiology) [name], MD as Consulting Physician (Cardiology)   Name of the patient: Rodman Recupero  [id number]  [birth year]   Date of visit: 09/12/17  Diagnosis- possible carcinoid on CT abdomen  Chief complaint/ Reason for visit- discuss CT abdomen findings  Heme/Onc history: Patient is a 71 year old male who was seen by me for immature cells noted in peripheral blood. Results of blood work from 12/03/2016 was as follows: CBC showed white count of 7.6, H&H of 16.2/46.9 and a platelet count of 137. Differential at that time was normal and no immature cells were noted. Review of peripheral blood smear showed mild thrombocytopenia. Blasts are not identified. Peripheral flow cytometry did not reveal any significant diagnostic immunophenotypic abnormality. EPO level was normal at 8.1.JAK2 testing was not completed because of insufficient sample. BCR abl testing done on 10/27/2016 did not reveal any evidence of 9:22 translocation suggestive of CML.  Patient was asked to f/u with PCP. He recently underwent CT abdomen for decreased kidney function and hematuria. CT showed: Abnormal partially calcified soft tissue density in the ileocolic mesentery could represent a carcinoid metastasis. Differential considerations include the sequelae of prior mesenteric adenitis/panniculitis. Given traversing and superior mesenteric artery branches on the prior exam from 2012, aneurysm is possible but felt less likely. Consider further evaluation with octreotide scintigraphy. 3. L5 vague sclerotic lesion is not readily apparent on the prior exam. This may represent a bone island. If the patient is eventually diagnosed with a malignancy, osseous metastasis would be a concern. 4. Possible soft tissue fullness within the pancreatic  body and tail with equivocal peripancreatic edema. Correlate with signs/symptoms of pancreatitis.  Interval history- he is doing well. Continues to work. Denies any abdominal pain, bleeding in stool or change in his bowel habits. Denies any episodes of hypotension, facial flushing or diarrhea  ECOG PS- 1 Pain scale- 0  Review of systems- Review of Systems  Constitutional: Negative for chills, fever, malaise/fatigue and weight loss.  HENT: Negative for congestion, ear discharge and nosebleeds.   Eyes: Negative for blurred vision.  Respiratory: Negative for cough, hemoptysis, sputum production, shortness of breath and wheezing.   Cardiovascular: Negative for chest pain, palpitations, orthopnea and claudication.  Gastrointestinal: Negative for abdominal pain, blood in stool, constipation, diarrhea, heartburn, melena, nausea and vomiting.  Genitourinary: Negative for dysuria, flank pain, frequency, hematuria and urgency.  Musculoskeletal: Negative for back pain, joint pain and myalgias.  Skin: Negative for rash.  Neurological: Negative for dizziness, tingling, focal weakness, seizures, weakness and headaches.  Endo/Heme/Allergies: Does not bruise/bleed easily.  Psychiatric/Behavioral: Negative for depression and suicidal ideas. The patient does not have insomnia.       Allergies  Allergen Reactions  . Levaquin [Levofloxacin In D5w] Rash     Past Medical History:  Diagnosis Date  . Atrial flutter ([place])   . COPD (chronic obstructive pulmonary disease) ([place])   . Diabetes mellitus without complication ([place])   . Hyperlipidemia   . Hypertension   . Polycythemia      Past Surgical History:  Procedure Laterality Date  . CARDIOVERSION N/A 09/08/2017   Procedure: CARDIOVERSION;  Surgeon: [name], MD;  Location: [facility] ORS;  Service: Cardiovascular;  Laterality: N/A;  . ELECTROPHYSIOLOGIC STUDY N/A 10/27/2016   Procedure: A-Flutter Ablation;  Surgeon: Deboraha Sprang, MD;   Location: [facility];  Service:  Cardiovascular;  Laterality: N/A;  . TEE WITH CARDIOVERSION  12/16    Social History   Social History  . Marital status: Widowed    Spouse name: N/A  . Number of children: N/A  . Years of education: N/A   Occupational History  . Not on file.   Social History Main Topics  . Smoking status: Former Smoker    Packs/day: 1.50    Years: 45.00    Types: Cigarettes    Quit date: 11/27/2012  . Smokeless tobacco: Never Used  . Alcohol use Yes     Comment: beer every once a while once a month  . Drug use: No  . Sexual activity: Not on file   Other Topics Concern  . Not on file   Social History Narrative  . No narrative on file    Family History  Problem Relation Age of Onset  . Hypertension Father      Current Outpatient Prescriptions:  .  acetaminophen (TYLENOL) 500 MG tablet, Take 500-1,000 mg by mouth every 6 (six) hours as needed (for pain.)., Disp: , Rfl:  .  apixaban (ELIQUIS) 5 MG TABS tablet, Take 1 tablet (5 mg total) by mouth 2 (two) times daily., Disp: 180 tablet, Rfl: 3 .  budesonide-formoterol (SYMBICORT) 80-4.5 MCG/ACT inhaler, Inhale 1 puff into the lungs 2 times daily at 12 noon and 4 pm., Disp: 1 Inhaler, Rfl: 0 .  diltiazem (CARDIZEM CD) 180 MG 24 hr capsule, Take 1 capsule (180 mg total) by mouth daily., Disp: 90 capsule, Rfl: 3 .  glucose blood (FREESTYLE LITE) test strip, Check blood sugar 2x a day as directed, Disp: , Rfl:  .  losartan (COZAAR) 25 MG tablet, Take 25 mg by mouth daily., Disp: , Rfl:  .  metFORMIN (GLUCOPHAGE) 1000 MG tablet, Take 1 tablet (1,000 mg total) by mouth 2 (two) times daily with a meal., Disp: 180 tablet, Rfl: 0 .  metoprolol succinate (TOPROL-XL) 50 MG 24 hr tablet, Take 1 tablet (50 mg total) by mouth daily. Take with or immediately following a meal., Disp: 90 tablet, Rfl: 3 .  rosuvastatin (CRESTOR) 40 MG tablet, TAKE 1 TABLET BY MOUTH ONCE DAILY (Patient taking differently: TAKE 1 TABLET  BY MOUTH ONCE DAILY AT BEDTIME.), Disp: 90 tablet, Rfl: 2 .  SPIRIVA HANDIHALER 18 MCG inhalation capsule, Place 18 mcg into inhaler and inhale daily., Disp: , Rfl:   Physical exam:  Vitals:   09/13/17 1406 09/13/17 1417  BP: 126/83   Pulse: 100   Resp: 20   Temp: 97.7 F (36.5 C)   TempSrc: Tympanic   SpO2:  95%  Weight: 202 lb 5 oz (91.8 kg)    Physical Exam  Constitutional: He is oriented to person, place, and time and well-developed, well-nourished, and in no distress.  HENT:  Head: Normocephalic and atraumatic.  Eyes: Pupils are equal, round, and reactive to light. EOM are normal.  Neck: Normal range of motion.  Cardiovascular: Normal rate, regular rhythm and normal heart sounds.   Pulmonary/Chest: Effort normal and breath sounds normal.  Abdominal: Soft. Bowel sounds are normal.  Neurological: He is alert and oriented to person, place, and time.  Skin: Skin is warm and dry.     CMP Latest Ref Rng & Units 09/06/2017  Glucose 65 - 99 mg/dL 139(H)  BUN 8 - 27 mg/dL 19  Creatinine 0.76 - 1.27 mg/dL 1.92(H)  Sodium 134 - 144 mmol/L 142  Potassium 3.5 - 5.2 mmol/L 4.5  Chloride  96 - 106 mmol/L 103  CO2 20 - 29 mmol/L 22  Calcium 8.6 - 10.2 mg/dL 9.4  Total Protein 6.4 - 8.2 g/dL -  Total Bilirubin 0.2 - 1.0 mg/dL -  Alkaline Phos Unit/L -  AST 15 - 37 Unit/L -  ALT U/L -   CBC Latest Ref Rng & Units 09/06/2017  WBC 3.4 - 10.8 x10E3/uL 7.0  Hemoglobin 13.0 - 17.7 g/dL 15.4  Hematocrit 37.5 - 51.0 % 47.2  Platelets 150 - 379 x10E3/uL 112(L)     Assessment and plan- Patient is a 71 y.o. male referred for abnormal CT abdomen findings  I have personally reviewed Ct abdomen images and discussed findings with radiology and the patient. Compared to prior Ct in 2012, patient now noted to have a calcified soft tissue density in the mesentery which was present back in 2012 but is now more prominent now and has califications as well which is concerning for metastatic  carcinoid.  Patient as such is asymptomatic and does not report any symptoms of classical carcinoid as mentioned above  I will obtain 24 hour urine 5 HIAA. I will also consider getting gallium PET scan to evaluate the soft tissue density further. The imaging findings seen in the pancreas not suggestive of pancreatic cancer. Gallium PET is typically sensitive for carcinoid but may give [place] some information on pancreas as well. If gallium pet does not give [place] answers regarding his pancreas, I will touch base with Dr. Mont Dutton to see if EUS could be considered to work this up further   Visit Diagnosis 1. Abnormal CT of the abdomen      Dr. Randa Evens, MD, MPH [facility] Pager- [phone] 09/13/2017 4:27 PM

## 2017-09-13 ENCOUNTER — Inpatient Hospital Stay: Payer: PPO

## 2017-09-13 ENCOUNTER — Encounter: Payer: Self-pay | Admitting: Oncology

## 2017-09-13 ENCOUNTER — Inpatient Hospital Stay: Payer: PPO | Attending: Oncology | Admitting: Oncology

## 2017-09-13 ENCOUNTER — Telehealth: Payer: Self-pay | Admitting: Oncology

## 2017-09-13 VITALS — BP 126/83 | HR 100 | Temp 97.7°F | Resp 20 | Wt 202.3 lb

## 2017-09-13 DIAGNOSIS — I10 Essential (primary) hypertension: Secondary | ICD-10-CM | POA: Diagnosis not present

## 2017-09-13 DIAGNOSIS — Z7901 Long term (current) use of anticoagulants: Secondary | ICD-10-CM | POA: Diagnosis not present

## 2017-09-13 DIAGNOSIS — E119 Type 2 diabetes mellitus without complications: Secondary | ICD-10-CM

## 2017-09-13 DIAGNOSIS — Z7984 Long term (current) use of oral hypoglycemic drugs: Secondary | ICD-10-CM

## 2017-09-13 DIAGNOSIS — Z79899 Other long term (current) drug therapy: Secondary | ICD-10-CM | POA: Diagnosis not present

## 2017-09-13 DIAGNOSIS — J449 Chronic obstructive pulmonary disease, unspecified: Secondary | ICD-10-CM

## 2017-09-13 DIAGNOSIS — Z87891 Personal history of nicotine dependence: Secondary | ICD-10-CM

## 2017-09-13 DIAGNOSIS — E785 Hyperlipidemia, unspecified: Secondary | ICD-10-CM

## 2017-09-13 DIAGNOSIS — D696 Thrombocytopenia, unspecified: Secondary | ICD-10-CM | POA: Diagnosis not present

## 2017-09-13 DIAGNOSIS — R935 Abnormal findings on diagnostic imaging of other abdominal regions, including retroperitoneum: Secondary | ICD-10-CM | POA: Diagnosis not present

## 2017-09-13 LAB — COMPREHENSIVE METABOLIC PANEL
ALT: 16 U/L — AB (ref 17–63)
AST: 17 U/L (ref 15–41)
Albumin: 3.5 g/dL (ref 3.5–5.0)
Alkaline Phosphatase: 64 U/L (ref 38–126)
Anion gap: 5 (ref 5–15)
BILIRUBIN TOTAL: 0.6 mg/dL (ref 0.3–1.2)
BUN: 23 mg/dL — ABNORMAL HIGH (ref 6–20)
CALCIUM: 9.2 mg/dL (ref 8.9–10.3)
CO2: 27 mmol/L (ref 22–32)
Chloride: 105 mmol/L (ref 101–111)
Creatinine, Ser: 2.1 mg/dL — ABNORMAL HIGH (ref 0.61–1.24)
GFR calc Af Amer: 34 mL/min — ABNORMAL LOW (ref >60)
GFR, EST NON AFRICAN AMERICAN: 30 mL/min — AB (ref >60)
Glucose, Bld: 248 mg/dL — ABNORMAL HIGH (ref 65–99)
Potassium: 4.2 mmol/L (ref 3.5–5.1)
Sodium: 137 mmol/L (ref 135–145)
TOTAL PROTEIN: 6.4 g/dL — AB (ref 6.5–8.1)

## 2017-09-13 LAB — CBC WITH DIFFERENTIAL/PLATELET
BASOS ABS: 0 10*3/uL (ref 0–0.1)
Basophils Relative: 1 %
EOS PCT: 2 %
Eosinophils Absolute: 0.1 10*3/uL (ref 0–0.7)
HCT: 45.8 % (ref 40.0–52.0)
Hemoglobin: 15.2 g/dL (ref 13.0–18.0)
LYMPHS ABS: 0.9 10*3/uL — AB (ref 1.0–3.6)
LYMPHS PCT: 13 %
MCH: 32.8 pg (ref 26.0–34.0)
MCHC: 33.1 g/dL (ref 32.0–36.0)
MCV: 98.9 fL (ref 80.0–100.0)
MONO ABS: 0.7 10*3/uL (ref 0.2–1.0)
Monocytes Relative: 10 %
Neutro Abs: 4.7 10*3/uL (ref 1.4–6.5)
Neutrophils Relative %: 74 %
Platelets: 145 10*3/uL — ABNORMAL LOW (ref 150–440)
RBC: 4.63 MIL/uL (ref 4.40–5.90)
RDW: 12.5 % (ref 11.5–14.5)
WBC: 6.4 10*3/uL (ref 3.8–10.6)

## 2017-09-13 NOTE — Progress Notes (Signed)
Pt reports unsure why he is in clinic today states " I hope it's not bad news".  Reports started on antibiotic yesterday, just picked up today, taking for chest congestion/infection.  Pulse ox 95% on room air.

## 2017-09-13 NOTE — Telephone Encounter (Signed)
Called Nuclear Medicine @ [facility] to schedule a Gallium PET Scan for patient.  Per [employer], Pre-Authorization is required before the test can be scheduled, due to it being costly and patients being scheduled without Pre-Auth. Judeen Hammans and [place] advised. Per Judeen Hammans, will speak with [place] regarding prior auth. Will call back to schedule Gallium PET after it's obtained. Patient advised.

## 2017-09-14 ENCOUNTER — Telehealth: Payer: Self-pay | Admitting: Oncology

## 2017-09-14 DIAGNOSIS — R935 Abnormal findings on diagnostic imaging of other abdominal regions, including retroperitoneum: Secondary | ICD-10-CM | POA: Diagnosis not present

## 2017-09-14 NOTE — Telephone Encounter (Signed)
Called Nuclear Medicine at [facility] to schedule Gallium, s/w Leim Fabry and was told to call back after 9:15 a.m. To speak with Denny Peon to schedule the test. Will call again later.

## 2017-09-15 ENCOUNTER — Other Ambulatory Visit: Payer: Self-pay

## 2017-09-15 DIAGNOSIS — R935 Abnormal findings on diagnostic imaging of other abdominal regions, including retroperitoneum: Secondary | ICD-10-CM

## 2017-09-16 ENCOUNTER — Telehealth: Payer: Self-pay | Admitting: *Deleted

## 2017-09-16 NOTE — Telephone Encounter (Signed)
Called pt and left him message about his scan and the instructions.  He called me back and I went over them over th phone, I have mailed him the directions to [facility] and went over special instructions. NPO for 8 hours, do not take metformin that am. Directions to go at [facility] once inside of main hospital.  He needed directions from his house to [place] and printed google map directions and mailed also

## 2017-09-19 LAB — 5 HIAA, QUANTITATIVE, URINE, 24 HOUR
5-HIAA, Ur: 5.3 mg/L
5-HIAA,Quant.,24 Hr Urine: 5 mg/24 hr (ref 0.0–14.9)
Total Volume: 950

## 2017-09-26 DIAGNOSIS — D471 Chronic myeloproliferative disease: Secondary | ICD-10-CM | POA: Diagnosis not present

## 2017-09-26 DIAGNOSIS — I4892 Unspecified atrial flutter: Secondary | ICD-10-CM | POA: Diagnosis not present

## 2017-09-26 DIAGNOSIS — J209 Acute bronchitis, unspecified: Secondary | ICD-10-CM | POA: Diagnosis not present

## 2017-09-26 DIAGNOSIS — E1129 Type 2 diabetes mellitus with other diabetic kidney complication: Secondary | ICD-10-CM | POA: Diagnosis not present

## 2017-09-27 ENCOUNTER — Encounter (HOSPITAL_COMMUNITY)
Admission: RE | Admit: 2017-09-27 | Discharge: 2017-09-27 | Disposition: A | Discharge status: Home / Self Care | Payer: PPO | Source: Ambulatory Visit | Attending: Oncology | Admitting: Oncology

## 2017-09-27 ENCOUNTER — Inpatient Hospital Stay: Payer: PPO | Attending: Oncology | Admitting: Oncology

## 2017-09-27 VITALS — BP 121/79 | HR 96 | Temp 96.6°F | Resp 16 | Wt 202.0 lb

## 2017-09-27 DIAGNOSIS — Z79899 Other long term (current) drug therapy: Secondary | ICD-10-CM | POA: Diagnosis not present

## 2017-09-27 DIAGNOSIS — E119 Type 2 diabetes mellitus without complications: Secondary | ICD-10-CM | POA: Diagnosis not present

## 2017-09-27 DIAGNOSIS — I4892 Unspecified atrial flutter: Secondary | ICD-10-CM | POA: Diagnosis not present

## 2017-09-27 DIAGNOSIS — C7B00 Secondary carcinoid tumors, unspecified site: Secondary | ICD-10-CM | POA: Diagnosis not present

## 2017-09-27 DIAGNOSIS — R935 Abnormal findings on diagnostic imaging of other abdominal regions, including retroperitoneum: Secondary | ICD-10-CM | POA: Diagnosis not present

## 2017-09-27 DIAGNOSIS — D3A011 Benign carcinoid tumor of the jejunum: Secondary | ICD-10-CM

## 2017-09-27 DIAGNOSIS — J449 Chronic obstructive pulmonary disease, unspecified: Secondary | ICD-10-CM | POA: Diagnosis not present

## 2017-09-27 DIAGNOSIS — D696 Thrombocytopenia, unspecified: Secondary | ICD-10-CM | POA: Insufficient documentation

## 2017-09-27 DIAGNOSIS — N289 Disorder of kidney and ureter, unspecified: Secondary | ICD-10-CM | POA: Insufficient documentation

## 2017-09-27 DIAGNOSIS — Z87891 Personal history of nicotine dependence: Secondary | ICD-10-CM

## 2017-09-27 DIAGNOSIS — E785 Hyperlipidemia, unspecified: Secondary | ICD-10-CM

## 2017-09-27 DIAGNOSIS — I10 Essential (primary) hypertension: Secondary | ICD-10-CM | POA: Diagnosis not present

## 2017-09-27 DIAGNOSIS — E340 Carcinoid syndrome: Secondary | ICD-10-CM | POA: Diagnosis not present

## 2017-09-27 MED ORDER — GALLIUM GA 68 DOTATATE IV KIT
4.4000 | PACK | Freq: Once | INTRAVENOUS | Status: AC
  Administered 2017-09-27: 4.4 via INTRAVENOUS

## 2017-09-27 NOTE — Progress Notes (Signed)
Hematology/Oncology Consult note [facility]  Telephone:([phone] [phone]  Patient Care Team: [name], MD as PCP - General (Cardiology) [name], MD as Consulting Physician (Cardiology)   Name of the patient: [name]  [id number]  08/07/1946   Date of visit: 09/27/17  Diagnosis- metastatic carcinoid  Chief complaint/ Reason for visit- discuss gallium pet findings  Heme/Onc history: Patient is a 71 year old male who was seen by me for immature cells noted in peripheral blood. Results of blood work from 12/03/2016 was as follows: CBC showed white count of 7.6, H&H of 16.2/46.9 and a platelet count of 137. Differential at that time was normal and no immature cells were noted. Review of peripheral blood smear showed mild thrombocytopenia. Blasts are not identified. Peripheral flow cytometry did not reveal any significant diagnostic immunophenotypic abnormality. EPO level was normal at 8.1.JAK2 testing was not completed because of insufficient sample. BCR abl testing done on 10/27/2016 did not reveal any evidence of 9:22 translocation suggestive of CML.  Patient was asked to f/u with PCP. He recently underwent CT abdomen for decreased kidney function and hematuria. CT showed: Abnormal partially calcified soft tissue density in the ileocolic mesentery could represent a carcinoid metastasis. Differential considerations include the sequelae of prior mesenteric adenitis/panniculitis. Given traversing and superior mesenteric artery branches on the prior exam from 2012, aneurysm is possible but felt less likely. Consider further evaluation with octreotide scintigraphy. 3. L5 vague sclerotic lesion is not readily apparent on the prior exam. This may represent a bone island. If the patient is eventually diagnosed with a malignancy, osseous metastasis would be a concern. 4. Possible soft tissue fullness within the pancreatic body and  tail with equivocal peripancreatic edema. Correlate with signs/symptoms of pancreatitis.    Interval history- he is doing well. He denies any chest pain, flushing or diarrhea  ECOG PS- 1 Pain scale- 0 Opioid associated constipation- no  Review of systems- Review of Systems  Constitutional: Negative for chills, fever, malaise/fatigue and weight loss.  HENT: Negative for congestion, ear discharge and nosebleeds.   Eyes: Negative for blurred vision.  Respiratory: Negative for cough, hemoptysis, sputum production, shortness of breath and wheezing.   Cardiovascular: Negative for chest pain, palpitations, orthopnea and claudication.  Gastrointestinal: Negative for abdominal pain, blood in stool, constipation, diarrhea, heartburn, melena, nausea and vomiting.  Genitourinary: Negative for dysuria, flank pain, frequency, hematuria and urgency.  Musculoskeletal: Negative for back pain, joint pain and myalgias.  Skin: Negative for rash.  Neurological: Negative for dizziness, tingling, focal weakness, seizures, weakness and headaches.  Endo/Heme/Allergies: Does not bruise/bleed easily.  Psychiatric/Behavioral: Negative for depression and suicidal ideas. The patient does not have insomnia.       Allergies  Allergen Reactions  . Levaquin [Levofloxacin In D5w] Rash     Past Medical History:  Diagnosis Date  . Atrial flutter ([place])   . COPD (chronic obstructive pulmonary disease) ([place])   . Diabetes mellitus without complication ([place])   . Hyperlipidemia   . Hypertension   . Polycythemia      Past Surgical History:  Procedure Laterality Date  . TEE WITH CARDIOVERSION  12/16    Social History   Socioeconomic History  . Marital status: Widowed    Spouse name: Not on file  . Number of children: Not on file  . Years of education: Not on file  . Highest education level: Not on file  Social Needs  . Financial resource strain: Not on file  .  Food insecurity - worry: Not on file  .  Food insecurity - inability: Not on file  . Transportation needs - medical: Not on file  . Transportation needs - non-medical: Not on file  Occupational History  . Not on file  Tobacco Use  . Smoking status: Former Smoker    Packs/day: 1.50    Years: 45.00    Pack years: 67.50    Types: Cigarettes    Last attempt to quit: 11/27/2012    Years since quitting: 4.8  . Smokeless tobacco: Never Used  Substance and Sexual Activity  . Alcohol use: Yes    Comment: beer every once a while once a month  . Drug use: No  . Sexual activity: Not on file  Other Topics Concern  . Not on file  Social History Narrative  . Not on file    Family History  Problem Relation Age of Onset  . Hypertension Father      Current Outpatient Medications:  .  acetaminophen (TYLENOL) 500 MG tablet, Take 500-1,000 mg by mouth every 6 (six) hours as needed (for pain.)., Disp: , Rfl:  .  apixaban (ELIQUIS) 5 MG TABS tablet, Take 1 tablet (5 mg total) by mouth 2 (two) times daily., Disp: 180 tablet, Rfl: 3 .  budesonide-formoterol (SYMBICORT) 80-4.5 MCG/ACT inhaler, Inhale 1 puff into the lungs 2 times daily at 12 noon and 4 pm., Disp: 1 Inhaler, Rfl: 0 .  diltiazem (CARDIZEM CD) 180 MG 24 hr capsule, Take 1 capsule (180 mg total) by mouth daily., Disp: 90 capsule, Rfl: 3 .  glucose blood (FREESTYLE LITE) test strip, Check blood sugar 2x a day as directed, Disp: , Rfl:  .  losartan (COZAAR) 25 MG tablet, Take 25 mg by mouth daily., Disp: , Rfl:  .  metFORMIN (GLUCOPHAGE) 1000 MG tablet, Take 1 tablet (1,000 mg total) by mouth 2 (two) times daily with a meal., Disp: 180 tablet, Rfl: 0 .  metoprolol succinate (TOPROL-XL) 50 MG 24 hr tablet, Take 50 mg daily by mouth. Take with or immediately following a meal., Disp: , Rfl:  .  rosuvastatin (CRESTOR) 40 MG tablet, TAKE 1 TABLET BY MOUTH ONCE DAILY (Patient taking differently: TAKE 1 TABLET BY MOUTH ONCE DAILY AT BEDTIME.), Disp: 90 tablet, Rfl: 2 .  SPIRIVA HANDIHALER  18 MCG inhalation capsule, Place 18 mcg into inhaler and inhale daily., Disp: , Rfl:   Physical exam:  Vitals:   09/27/17 1417  BP: 121/79  Pulse: 96  Resp: 16  Temp: (!) 96.6 F (35.9 C)  TempSrc: Tympanic  Weight: 202 lb (91.6 kg)   Physical Exam  Constitutional: He is oriented to person, place, and time and well-developed, well-nourished, and in no distress.  HENT:  Head: Normocephalic and atraumatic.  Eyes: EOM are normal. Pupils are equal, round, and reactive to light.  Neck: Normal range of motion.  Cardiovascular: Normal rate, regular rhythm and normal heart sounds.  Pulmonary/Chest: Effort normal and breath sounds normal.  Abdominal: Soft. Bowel sounds are normal.  Neurological: He is alert and oriented to person, place, and time.  Skin: Skin is warm and dry.     CMP Latest Ref Rng & Units 09/13/2017  Glucose 65 - 99 mg/dL 248(H)  BUN 6 - 20 mg/dL 23(H)  Creatinine 0.61 - 1.24 mg/dL 2.10(H)  Sodium 135 - 145 mmol/L 137  Potassium 3.5 - 5.1 mmol/L 4.2  Chloride 101 - 111 mmol/L 105  CO2 22 - 32 mmol/L 27  Calcium  8.9 - 10.3 mg/dL 9.2  Total Protein 6.5 - 8.1 g/dL 6.4(L)  Total Bilirubin 0.3 - 1.2 mg/dL 0.6  Alkaline Phos 38 - 126 U/L 64  AST 15 - 41 U/L 17  ALT 17 - 63 U/L 16(L)   CBC Latest Ref Rng & Units 09/13/2017  WBC 3.8 - 10.6 K/uL 6.4  Hemoglobin 13.0 - 18.0 g/dL 15.2  Hematocrit 40.0 - 52.0 % 45.8  Platelets 150 - 440 K/uL 145(L)    No images are attached to the encounter.  Nm Pet (netspot Ga 68 Dotatate) Skull Base To Mid Thigh  Result Date: 09/27/2017 CLINICAL DATA:  Abnormal CT scan suspicious for carcinoid tumor. EXAM: NUCLEAR MEDICINE PET SKULL BASE TO THIGH TECHNIQUE: 4.4 millicuries mCi Ga 68 DOTATATE was injected intravenously. Full-ring PET imaging was performed from the skull base to thigh after the radiotracer. CT data was obtained and used for attenuation correction and anatomic localization. COMPARISON:  CT 08/04/2017 FINDINGS: NECK No  radiotracer activity in neck lymph nodes. CHEST No radiotracer accumulation within mediastinal or hilar lymph nodes. No suspicious pulmonary nodules on the CT scan. ABDOMEN/PELVIS Partially calcified mass within the central mesenteries of the RIGHT abdomen measuring 2.7 cm (image 150, series 4) has intense radiotracer activity with SUV max equal 52. Intense radiotracer activity associated with a lymph node adjacent to the central mesenteric vessels (SMV and SMA) measuring 10 mm (image 134, series 4) with SUV max equal 19.5. Additional focal radiotracer activity associated with a loop of small bowel in the lower abdomen midline (image 159 of the fused data set). The activity is fairly intense with SUV max equals 16.3. There is no CT correlation for this lesion. No abnormal radiotracer accumulation liver. No abnormal radiotracer accumulation within the pancreas. Physiologic activity present within the adrenal glands. LEFT adrenal adenoma again noted. No metastatic adenopathy in the pelvis. Urine identified in the distal LEFT ureter. Physiologic activity noted in the liver, spleen, adrenal glands and kidneys. SKELETON No focal activity to suggest skeletal metastasis. IMPRESSION: 1. 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 2. Single nodal metastasis at the root of the mesenteric vessels. 3. Focal activity within the the small bowel of the lower abdomen midline without CT correlation. This could represent a primary neuroendocrine tumor neoplasm. Consider CT enterography for further evaluation. 4. No abnormal radiotracer accumulation within the pancreas. Electronically Signed   By: Suzy Bouchard M.D.   On: 09/27/2017 10:00     Assessment and plan- Patient is a 71 y.o. male with findings of carcinoid on gallium PET  I discussed the results of gallium PET with the patient and I have personally reviewed the radiology findings as  well.  Patient does have significant uptake in the mesenteric mass as well as single nodal metastases and focal activity in the small bowel which is likely the origin of his carcinoid tumor.  24-hour 5-hydroxyindoleacetic acid levels in his urine were within normal limits.  Patient is currently asymptomatic and reports no abdominal pain, cramping, diarrhea, flushing, chest pain or shortness of breath.  I will discuss his case at the tumor board to see if this would be potentially resectable.  If this is not resectable I will consider doing long-acting lanreotide versus continued observation.  I will call the patient with the consensus decision after his discussion at tumor board this week   Visit Diagnosis 1. Benign carcinoid tumor of jejunum      Dr. Randa Evens, MD,  [facility] [place] [facility] Pager- [phone] 09/27/2017 3:40 PM

## 2017-09-27 NOTE — Progress Notes (Signed)
Patient is here for follow up with PET results today. He states that he is feeling well today and denies having any pain at this moment. He does report intermittent right side pain , but states that he broke some ribs years ago and sometimes when it rains, his side hurts. Patient states that "I hope there is nothing bad wrong with me".

## 2017-09-29 ENCOUNTER — Telehealth: Payer: Self-pay | Admitting: *Deleted

## 2017-09-29 ENCOUNTER — Encounter: Payer: Self-pay | Admitting: Oncology

## 2017-09-29 NOTE — Telephone Encounter (Signed)
Called pt and let him know that his case was discussed in case conference. Dr. Janese Banks would like to speak to him in person and would like him to come and see her 11/21 on a wed. At 8:30 Pt states he can make that appt. He has to be at work at 12 noon that day and I told him he will be done way before 12 noon.

## 2017-10-05 ENCOUNTER — Other Ambulatory Visit: Payer: Self-pay

## 2017-10-05 ENCOUNTER — Inpatient Hospital Stay (HOSPITAL_BASED_OUTPATIENT_CLINIC_OR_DEPARTMENT_OTHER): Payer: PPO | Admitting: Oncology

## 2017-10-05 VITALS — BP 121/75 | HR 120 | Temp 97.8°F | Resp 20 | Wt 204.9 lb

## 2017-10-05 DIAGNOSIS — Z79899 Other long term (current) drug therapy: Secondary | ICD-10-CM

## 2017-10-05 DIAGNOSIS — J449 Chronic obstructive pulmonary disease, unspecified: Secondary | ICD-10-CM | POA: Diagnosis not present

## 2017-10-05 DIAGNOSIS — D696 Thrombocytopenia, unspecified: Secondary | ICD-10-CM

## 2017-10-05 DIAGNOSIS — E785 Hyperlipidemia, unspecified: Secondary | ICD-10-CM | POA: Diagnosis not present

## 2017-10-05 DIAGNOSIS — E119 Type 2 diabetes mellitus without complications: Secondary | ICD-10-CM | POA: Diagnosis not present

## 2017-10-05 DIAGNOSIS — I4892 Unspecified atrial flutter: Secondary | ICD-10-CM

## 2017-10-05 DIAGNOSIS — Z87891 Personal history of nicotine dependence: Secondary | ICD-10-CM | POA: Diagnosis not present

## 2017-10-05 DIAGNOSIS — N289 Disorder of kidney and ureter, unspecified: Secondary | ICD-10-CM

## 2017-10-05 DIAGNOSIS — D3A011 Benign carcinoid tumor of the jejunum: Secondary | ICD-10-CM | POA: Diagnosis not present

## 2017-10-05 DIAGNOSIS — C7B00 Secondary carcinoid tumors, unspecified site: Secondary | ICD-10-CM | POA: Diagnosis not present

## 2017-10-05 DIAGNOSIS — I10 Essential (primary) hypertension: Secondary | ICD-10-CM

## 2017-10-05 NOTE — Progress Notes (Signed)
Patient here today for follow up and treatment planning regarding carcinoid. Patient denies any concern today.

## 2017-10-05 NOTE — Progress Notes (Signed)
Hematology/Oncology Consult note [facility]  Telephone:([phone] [phone]  Patient Care Team: [name], MD as PCP - General (Cardiology) [name], MD as Consulting Physician (Cardiology)   Name of the patient: [name]  [id number]  23-Sep-1946   Date of visit: 10/05/17  Diagnosis- metastatic small bowel carcinoid  Chief complaint/ Reason for visit- discuss management options for carcinoid  Heme/Onc history: Patient is a 71 year old male who was seen by me for immature cells noted inperipheral blood.Results of blood work from 12/03/2016 was as follows: CBC showed white count of 7.6, H&H of 16.2/46.9 and a platelet count of 137. Differential at that time was normal and no immature cells were noted. Review of peripheral blood smear showed mild thrombocytopenia. Blasts are not identified. Peripheral flow cytometry did not reveal any significant diagnostic immunophenotypic abnormality. EPO level was normal at 8.1.JAK2 testing was not completed because of insufficient sample. BCR abl testing done on 10/27/2016 did not reveal any evidence of 9:22 translocation suggestive of CML.  Patient was asked to f/u with PCP. He recently underwent CT abdomen for decreased kidney function and hematuria. CT showed:Abnormal partially calcified soft tissue density in the ileocolic mesentery could represent a carcinoid metastasis. Differential considerations include the sequelae of prior mesenteric adenitis/panniculitis. Given traversing and superior mesenteric artery branches on the prior exam from 2012, aneurysm is possible but felt less likely. Consider further evaluation with octreotide scintigraphy. 3. L5 vague sclerotic lesion is not readily apparent on the prior exam. This may represent a bone island. If the patient is eventually diagnosed with a malignancy, osseous metastasis would be a concern. 4. Possible soft tissue fullness within  the pancreatic body and tail with equivocal peripancreatic edema. Correlate with signs/symptoms of pancreatitis.  Interval history- he is doing well and denies any complaints  ECOG PS- 0 Pain scale- 0   Review of systems- Review of Systems  Constitutional: Negative for chills, fever, malaise/fatigue and weight loss.  HENT: Negative for congestion, ear discharge and nosebleeds.   Eyes: Negative for blurred vision.  Respiratory: Negative for cough, hemoptysis, sputum production, shortness of breath and wheezing.   Cardiovascular: Negative for chest pain, palpitations, orthopnea and claudication.  Gastrointestinal: Negative for abdominal pain, blood in stool, constipation, diarrhea, heartburn, melena, nausea and vomiting.  Genitourinary: Negative for dysuria, flank pain, frequency, hematuria and urgency.  Musculoskeletal: Negative for back pain, joint pain and myalgias.  Skin: Negative for rash.  Neurological: Negative for dizziness, tingling, focal weakness, seizures, weakness and headaches.  Endo/Heme/Allergies: Does not bruise/bleed easily.  Psychiatric/Behavioral: Negative for depression and suicidal ideas. The patient does not have insomnia.      Allergies  Allergen Reactions  . Levaquin [Levofloxacin In D5w] Rash     Past Medical History:  Diagnosis Date  . Atrial flutter ([place])   . COPD (chronic obstructive pulmonary disease) ([place])   . Diabetes mellitus without complication ([place])   . Hyperlipidemia   . Hypertension   . Polycythemia      Past Surgical History:  Procedure Laterality Date  . CARDIOVERSION N/A 09/08/2017   Procedure: CARDIOVERSION;  Surgeon: [name], MD;  Location: [facility] ORS;  Service: Cardiovascular;  Laterality: N/A;  . ELECTROPHYSIOLOGIC STUDY N/A 10/27/2016   Procedure: A-Flutter Ablation;  Surgeon: Deboraha Sprang, MD;  Location: [place] [facility];  Service: Cardiovascular;  Laterality: N/A;  . TEE WITH CARDIOVERSION  12/16    Social  History   Socioeconomic History  . Marital status:  Widowed    Spouse name: Not on file  . Number of children: Not on file  . Years of education: Not on file  . Highest education level: Not on file  Social Needs  . Financial resource strain: Not on file  . Food insecurity - worry: Not on file  . Food insecurity - inability: Not on file  . Transportation needs - medical: Not on file  . Transportation needs - non-medical: Not on file  Occupational History  . Not on file  Tobacco Use  . Smoking status: Former Smoker    Packs/day: 1.50    Years: 45.00    Pack years: 67.50    Types: Cigarettes    Last attempt to quit: 11/27/2012    Years since quitting: 4.8  . Smokeless tobacco: Never Used  Substance and Sexual Activity  . Alcohol use: Yes    Comment: beer every once a while once a month  . Drug use: No  . Sexual activity: Not on file  Other Topics Concern  . Not on file  Social History Narrative  . Not on file    Family History  Problem Relation Age of Onset  . Hypertension Father      Current Outpatient Medications:  .  acetaminophen (TYLENOL) 500 MG tablet, Take 500-1,000 mg by mouth every 6 (six) hours as needed (for pain.)., Disp: , Rfl:  .  apixaban (ELIQUIS) 5 MG TABS tablet, Take 1 tablet (5 mg total) by mouth 2 (two) times daily., Disp: 180 tablet, Rfl: 3 .  budesonide-formoterol (SYMBICORT) 80-4.5 MCG/ACT inhaler, Inhale 1 puff into the lungs 2 times daily at 12 noon and 4 pm., Disp: 1 Inhaler, Rfl: 0 .  diltiazem (CARDIZEM CD) 180 MG 24 hr capsule, Take 1 capsule (180 mg total) by mouth daily., Disp: 90 capsule, Rfl: 3 .  glucose blood (FREESTYLE LITE) test strip, Check blood sugar 2x a day as directed, Disp: , Rfl:  .  losartan (COZAAR) 25 MG tablet, Take 25 mg by mouth daily., Disp: , Rfl:  .  metFORMIN (GLUCOPHAGE) 1000 MG tablet, Take 1 tablet (1,000 mg total) by mouth 2 (two) times daily with a meal., Disp: 180 tablet, Rfl: 0 .  metoprolol succinate  (TOPROL-XL) 50 MG 24 hr tablet, Take 50 mg daily by mouth. Take with or immediately following a meal., Disp: , Rfl:  .  rosuvastatin (CRESTOR) 40 MG tablet, TAKE 1 TABLET BY MOUTH ONCE DAILY (Patient taking differently: TAKE 1 TABLET BY MOUTH ONCE DAILY AT BEDTIME.), Disp: 90 tablet, Rfl: 2 .  SPIRIVA HANDIHALER 18 MCG inhalation capsule, Place 18 mcg into inhaler and inhale daily., Disp: , Rfl:   Physical exam:  Vitals:   10/05/17 0841  BP: 121/75  Pulse: (!) 120  Resp: 20  Temp: 97.8 F (36.6 C)  TempSrc: Tympanic  SpO2: 96%  Weight: 204 lb 14.4 oz (92.9 kg)   Physical Exam  Constitutional: He is oriented to person, place, and time and well-developed, well-nourished, and in no distress.  HENT:  Head: Normocephalic and atraumatic.  Eyes: EOM are normal. Pupils are equal, round, and reactive to light.  Neck: Normal range of motion.  Cardiovascular: Normal rate, regular rhythm and normal heart sounds.  Pulmonary/Chest: Effort normal and breath sounds normal.  Abdominal: Soft. Bowel sounds are normal.  Neurological: He is alert and oriented to person, place, and time.  Skin: Skin is warm and dry.     CMP Latest Ref Rng & Units 09/13/2017  Glucose 65 - 99 mg/dL 248(H)  BUN 6 - 20 mg/dL 23(H)  Creatinine 0.61 - 1.24 mg/dL 2.10(H)  Sodium 135 - 145 mmol/L 137  Potassium 3.5 - 5.1 mmol/L 4.2  Chloride 101 - 111 mmol/L 105  CO2 22 - 32 mmol/L 27  Calcium 8.9 - 10.3 mg/dL 9.2  Total Protein 6.5 - 8.1 g/dL 6.4(L)  Total Bilirubin 0.3 - 1.2 mg/dL 0.6  Alkaline Phos 38 - 126 U/L 64  AST 15 - 41 U/L 17  ALT 17 - 63 U/L 16(L)   CBC Latest Ref Rng & Units 09/13/2017  WBC 3.8 - 10.6 K/uL 6.4  Hemoglobin 13.0 - 18.0 g/dL 15.2  Hematocrit 40.0 - 52.0 % 45.8  Platelets 150 - 440 K/uL 145(L)    No images are attached to the encounter.  Nm Pet (netspot Ga 68 Dotatate) Skull Base To Mid Thigh  Result Date: 09/27/2017 CLINICAL DATA:  Abnormal CT scan suspicious for carcinoid tumor.  EXAM: NUCLEAR MEDICINE PET SKULL BASE TO THIGH TECHNIQUE: 4.4 millicuries mCi Ga 68 DOTATATE was injected intravenously. Full-ring PET imaging was performed from the skull base to thigh after the radiotracer. CT data was obtained and used for attenuation correction and anatomic localization. COMPARISON:  CT 08/04/2017 FINDINGS: NECK No radiotracer activity in neck lymph nodes. CHEST No radiotracer accumulation within mediastinal or hilar lymph nodes. No suspicious pulmonary nodules on the CT scan. ABDOMEN/PELVIS Partially calcified mass within the central mesenteries of the RIGHT abdomen measuring 2.7 cm (image 150, series 4) has intense radiotracer activity with SUV max equal 52. Intense radiotracer activity associated with a lymph node adjacent to the central mesenteric vessels (SMV and SMA) measuring 10 mm (image 134, series 4) with SUV max equal 19.5. Additional focal radiotracer activity associated with a loop of small bowel in the lower abdomen midline (image 159 of the fused data set). The activity is fairly intense with SUV max equals 16.3. There is no CT correlation for this lesion. No abnormal radiotracer accumulation liver. No abnormal radiotracer accumulation within the pancreas. Physiologic activity present within the adrenal glands. LEFT adrenal adenoma again noted. No metastatic adenopathy in the pelvis. Urine identified in the distal LEFT ureter. Physiologic activity noted in the liver, spleen, adrenal glands and kidneys. SKELETON No focal activity to suggest skeletal metastasis. IMPRESSION: 1. 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 2. Single nodal metastasis at the root of the mesenteric vessels. 3. Focal activity within the the small bowel of the lower abdomen midline without CT correlation. This could represent a primary neuroendocrine tumor neoplasm. Consider CT enterography for further evaluation. 4. No  abnormal radiotracer accumulation within the pancreas. Electronically Signed   By: Suzy Bouchard M.D.   On: 09/27/2017 10:00     Assessment and plan- Patient is a 71 y.o. male with metastatic small bowel carcinoid  I discussed patient's case at the tumor conference.  At this point directed 3 possible management options for his carcinoid.  Surgical intervention is a possibility however Dr. Rosana Hoes suggested that laparoscopic surgery may not be possible and patient may require open surgery.  And even with open surgery it is possible that they may not be able to locate the primary tumor in the small bowel as well as the suspicious lymph node which was noted adjacent to the central mesenteric vessels and therefore surgery may not be able to take out all the tumor.  At this time patient has no symptoms from  his carcinoid and relatively small tumor burden.  Long-acting lanreotide has been used in asymptomatic metastatic carcinoid but it is particularly beneficial with high-volume disease   Also discussed the results of CLARINET trial which was a phase 3 trial in patients with gastroentero pancreatic neuroendocrine tumors which showed highly significant advantage in progression free survival with the use of lanreotide versus placebo at 24 months which was 65% versus 33%. No difference in overall survival  At this time he does not have high volume disease and no liver metastases. I would therefore favor wait and watch approach given that he is asymptomatic. Repeat cbc, cmp, chromogranin A and urine 5HIAA in 6 months as well as gallium PET/CT. If there is evidence of progression or if he is symptomatic, I will consider sandostatin or lanreotide at that time   Visit Diagnosis 1. Metastatic carcinoid tumor [facility])      Dr. Randa Evens, MD, MPH [place] at [facility] Pager- [phone] 10/05/2017 11:02 AM

## 2017-10-10 NOTE — Progress Notes (Signed)
Cardiology Office Note  Date:  10/11/2017   ID:  Eithen, Castiglia 27-May-1946, MRN [id number]  PCP:  [name], MD   Chief Complaint  Patient presents with  . other    1 month follow up from cardioversion. Patient denies chest pain and SOB. Meds reviewed verbally with patient.     HPI:  Mr. Inabinet is a 71 year old gentleman, patient of Dr. Rebecka Apley, with   hypertension,  diabetes,  COPD,  long smoking history, polycythemia,   tachycardia.  Cardiology evaluated the patient 10/29/2014.  EKG showed atrial flutter with heart rate 140s up to 150.  long smoking history for 45 years.  started on beta blockers, diltiazem, digoxin, eventually started on amiodarone infusion with anticoagulation, eliquis. He had TEE and cardioversion on 10/30/2014. Normal sinus rhythm was successfully Cardioversion February 2016 Flutter April 2018 converted on his own within 1 week Ablation October 2017 He presents for follow-up of his atrial flutter Dx of   catheter ablation for his flutter October 2017 Age-14, DM-1 HTN-1  CHADSVASc 3 Metoprolol dose was increased He is on anticoagulation. 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 Eddie Dibbles Compliant with his inhalers  Other past medical history reviewed  total cholesterol 167, LDL 100, hemoglobin A1c 7.7  Previous cardioversion on 01/07/2015 which successfully restored normal sinus rhythm  TEE report showed ejection fraction 45-50%, right and left atrium mildly enlarged, RV mildly dilated with mildly reduced systolic function, small amount of plaque in the aorta  CT scan of the chest 10/29/2014 in the hospital showed bilateral predominantly lower lobe pulmonary opacity most consistent with pneumonia   PMH:   has a past medical history of Atrial flutter ([place]), COPD (chronic obstructive pulmonary disease) ([place]), Diabetes mellitus without complication ([place]), Hyperlipidemia, Hypertension, and Polycythemia.  PSH:    Past Surgical History:  Procedure Laterality Date  . CARDIOVERSION N/A 09/08/2017   Procedure: CARDIOVERSION;  Surgeon: [name], MD;  Location: [facility] ORS;  Service: Cardiovascular;  Laterality: N/A;  . ELECTROPHYSIOLOGIC STUDY N/A 10/27/2016   Procedure: A-Flutter Ablation;  Surgeon: Deboraha Sprang, MD;  Location: [place] [facility];  Service: Cardiovascular;  Laterality: N/A;  . TEE WITH CARDIOVERSION  12/16    Current Outpatient Medications  Medication Sig Dispense Refill  . acetaminophen (TYLENOL) 500 MG tablet Take 500-1,000 mg by mouth every 6 (six) hours as needed (for pain.).    Marland Kitchen apixaban (ELIQUIS) 5 MG TABS tablet Take 1 tablet (5 mg total) by mouth 2 (two) times daily. 180 tablet 3  . budesonide-formoterol (SYMBICORT) 80-4.5 MCG/ACT inhaler Inhale 1 puff into the lungs 2 times daily at 12 noon and  4 pm. 1 Inhaler 0  . diltiazem (CARDIZEM CD) 180 MG 24 hr capsule Take 1 capsule (180 mg total) by mouth daily. 90 capsule 3  . glucose blood (FREESTYLE LITE) test strip Check blood sugar 2x a day as directed    . losartan (COZAAR) 25 MG tablet Take 25 mg by mouth daily.    .  metFORMIN (GLUCOPHAGE) 1000 MG tablet Take 1 tablet (1,000 mg total) by mouth 2 (two) times daily with a meal. 180 tablet 0  . metoprolol succinate (TOPROL-XL) 50 MG 24 hr tablet Take 50 mg daily by mouth. Take with or immediately following a meal.    . rosuvastatin (CRESTOR) 40 MG tablet TAKE 1 TABLET BY MOUTH ONCE DAILY (Patient taking differently: TAKE 1 TABLET BY MOUTH ONCE DAILY AT BEDTIME.) 90 tablet 2  . SPIRIVA HANDIHALER 18 MCG inhalation capsule Place 18 mcg into inhaler and inhale daily.     No current facility-administered medications for this visit.      Allergies:   Levaquin [levofloxacin in d5w]   Social History:  The patient  reports that he quit smoking about 4 years ago. His smoking use included cigarettes. He has a 67.50 pack-year smoking history. he has never used smokeless tobacco. He reports that he drinks alcohol. He reports that he does not use drugs.   Family History:   family history includes Hypertension in his father.    Review of Systems: Review of Systems  Constitutional: Negative.   Respiratory: Positive for shortness of breath.   Cardiovascular: Negative.   Gastrointestinal: Negative.   Musculoskeletal: Negative.   Neurological: Negative.   Psychiatric/Behavioral: Negative.   All other systems reviewed and are negative.    PHYSICAL EXAM: VS:  BP 122/78 (BP Location: Left Arm, Patient Position: Sitting, Cuff Size: Normal)   Pulse (!) 126   Ht 5\' 7"  (1.702 m)   Wt 206 lb (93.4 kg)   BMI 32.26 kg/m  , BMI Body mass index is 32.26 kg/m. GEN: Well nourished, well developed, in no acute distress  HEENT: normal  Neck: no JVD, carotid bruits, or masses Cardiac: Regular rhythm, tachycardic no murmurs, rubs, or gallops,no edema  Respiratory:  Moderately decreased breath sounds throughout, normal work of breathing GI: soft, nontender, nondistended, + BS MS: no deformity or atrophy  Skin: warm and dry, no rash Neuro:  Strength and sensation are  intact Psych: euthymic mood, full affect    Recent Labs: 11/25/2016: TSH 2.800 09/13/2017: ALT 16; BUN 23; Creatinine, Ser 2.10; Hemoglobin 15.2; Platelets 145; Potassium 4.2; Sodium 137    Lipid Panel No results found for: CHOL, HDL, LDLCALC, TRIG    Wt Readings from Last 3 Encounters:  10/11/17 206 lb (93.4 kg)  10/05/17 204 lb 14.4 oz (92.9 kg)  09/27/17 202 lb (91.6 kg)       ASSESSMENT AND PLAN:   Essential hypertension -  We will continue current medications including metoprolol and diltiazem Discussed with Dr. Caryl Comes concerning atrial flutter management  Coronary artery disease involving native coronary artery of native heart with angina pectoris ([place]) - Plan: EKG 12-Lead, CANCELED: EKG 12-Lead Currently with no symptoms of angina. No further workup at this time. Continue current medication regimen.  Stable  Typical atrial flutter ([facility]) - Plan: EKG 12-Lead Case discussed with Dr. Caryl Comes Given the incessant nature of his tachycardia despite cardioversion will need to either start antiarrhythmics or repeat ablation Previously was on amiodarone in 2015.  Less than ideal given underlying severe lung disease  Dr. Caryl Comes to see in clinic this morning  Centrilobular emphysema ([place]) Stopped smoking many years ago, maintained on his inhalers No recent episodes of bronchitis  Uncontrolled type 2 diabetes mellitus with other circulatory complication, without long-term current use of insulin ([place]) We have encouraged  careful diet management in an effort to lose weight.   Total encounter time more than 45 minutes  Greater than 50% was spent in counseling and coordination of care with the patient  Risk and benefit of the procedure/cardioversion discussed with him in detail Instructions provided Disposition:   F/U  1 month   Orders Placed This Encounter  Procedures  . EKG 12-Lead     Signed, Esmond Plants, M.D., Ph.D. 10/11/2017  [place],  [place]

## 2017-10-11 ENCOUNTER — Encounter: Payer: Self-pay | Admitting: Internal Medicine

## 2017-10-11 ENCOUNTER — Ambulatory Visit (INDEPENDENT_AMBULATORY_CARE_PROVIDER_SITE_OTHER): Payer: PPO | Admitting: Internal Medicine

## 2017-10-11 ENCOUNTER — Ambulatory Visit: Payer: PPO | Admitting: Cardiovascular Disease

## 2017-10-11 VITALS — BP 122/78 | HR 126 | Ht 67.0 in | Wt 206.0 lb

## 2017-10-11 DIAGNOSIS — E1165 Type 2 diabetes mellitus with hyperglycemia: Secondary | ICD-10-CM

## 2017-10-11 DIAGNOSIS — I25118 Atherosclerotic heart disease of native coronary artery with other forms of angina pectoris: Secondary | ICD-10-CM | POA: Diagnosis not present

## 2017-10-11 DIAGNOSIS — I2510 Atherosclerotic heart disease of native coronary artery without angina pectoris: Secondary | ICD-10-CM | POA: Diagnosis not present

## 2017-10-11 DIAGNOSIS — J432 Centrilobular emphysema: Secondary | ICD-10-CM | POA: Diagnosis not present

## 2017-10-11 DIAGNOSIS — Z01812 Encounter for preprocedural laboratory examination: Secondary | ICD-10-CM | POA: Diagnosis not present

## 2017-10-11 DIAGNOSIS — I4892 Unspecified atrial flutter: Secondary | ICD-10-CM

## 2017-10-11 DIAGNOSIS — I483 Typical atrial flutter: Secondary | ICD-10-CM | POA: Diagnosis not present

## 2017-10-11 DIAGNOSIS — J449 Chronic obstructive pulmonary disease, unspecified: Secondary | ICD-10-CM | POA: Diagnosis not present

## 2017-10-11 DIAGNOSIS — I10 Essential (primary) hypertension: Secondary | ICD-10-CM | POA: Diagnosis not present

## 2017-10-11 NOTE — Patient Instructions (Addendum)
Medication Instructions:  Your physician recommends that you continue on your current medications as directed. Please refer to the Current Medication list given to you today.   Labwork: BMET and CBC today  Testing/Procedures: Your physician has recommended that you have an ablation. Catheter ablation is a medical procedure used to treat some cardiac arrhythmias (irregular heartbeats). During catheter ablation, a long, thin, flexible tube is put into a blood vessel in your groin (upper thigh), or neck. This tube is called an ablation catheter. It is then guided to your heart through the blood vessel. Radio frequency waves destroy small areas of heart tissue where abnormal heartbeats may cause an arrhythmia to start. Please see the instruction sheet given to you today.  Friday, November 30 with Dr. Curt Bears Arrival time 9:30am [place] Stay [facility] [place] [phone]  Nothing to eat or drink after midnight the evening before your ablation You should take your morning medications with a sip of water EXCEPT metformin. Wear clothes that are easy to get on and off.  Plan to stay overnight.    Follow-Up: Your physician recommends that you schedule a follow-up appointment in: 6 weeks with Dr. Caryl Comes.    Any Other Special Instructions Will Be Listed Below (If Applicable).     If you need a refill on your cardiac medications before your next appointment, please call your pharmacy.   Cardiac Ablation Cardiac ablation is a procedure to disable (ablate) a small amount of heart tissue in very specific places. The heart has many electrical connections. Sometimes these connections are abnormal and can cause the heart to beat very fast or irregularly. Ablating some of the problem areas can improve the heart rhythm or return it to normal. Ablation may be done for people who:  Have Wolff-Parkinson-White syndrome.  Have fast heart rhythms  (tachycardia).  Have taken medicines for an abnormal heart rhythm (arrhythmia) that were not effective or caused side effects.  Have a high-risk heartbeat that may be life-threatening.  During the procedure, a small incision is made in the neck or the groin, and a long, thin, flexible tube (catheter) is inserted into the incision and moved to the heart. Small devices (electrodes) on the tip of the catheter will send out electrical currents. A type of X-ray (fluoroscopy) will be used to help guide the catheter and to provide images of the heart. Tell a health care provider about:  Any allergies you have.  All medicines you are taking, including vitamins, herbs, eye drops, creams, and over-the-counter medicines.  Any problems you or family members have had with anesthetic medicines.  Any blood disorders you have.  Any surgeries you have had.  Any medical conditions you have, such as kidney failure.  Whether you are pregnant or may be pregnant. What are the risks? Generally, this is a safe procedure. However, problems may occur, including:  Infection.  Bruising and bleeding at the catheter insertion site.  Bleeding into the chest, especially into the sac that surrounds the heart. This is a serious complication.  Stroke or blood clots.  Damage to other structures or organs.  Allergic reaction to medicines or dyes.  Need for a permanent pacemaker if the normal electrical system is damaged. A pacemaker is a small computer that sends electrical signals to the heart and helps your heart beat normally.  The procedure not being fully effective. This may not be recognized until months later. Repeat ablation procedures are sometimes required.  What happens  before the procedure?  Follow instructions from your health care provider about eating or drinking restrictions.  Ask your health care provider about: ? Changing or stopping your regular medicines. This is especially important if  you are taking diabetes medicines or blood thinners. ? Taking medicines such as aspirin and ibuprofen. These medicines can thin your blood. Do not take these medicines before your procedure if your health care provider instructs you not to.  Plan to have someone take you home from the hospital or clinic.  If you will be going home right after the procedure, plan to have someone with you for 24 hours. What happens during the procedure?  To lower your risk of infection: ? Your health care team will wash or sanitize their hands. ? Your skin will be washed with soap. ? Hair may be removed from the incision area.  An IV tube will be inserted into one of your veins.  You will be given a medicine to help you relax (sedative).  The skin on your neck or groin will be numbed.  An incision will be made in your neck or your groin.  A needle will be inserted through the incision and into a large vein in your neck or groin.  A catheter will be inserted into the needle and moved to your heart.  Dye may be injected through the catheter to help your surgeon see the area of the heart that needs treatment.  Electrical currents will be sent from the catheter to ablate heart tissue in desired areas. There are three types of energy that may be used to ablate heart tissue: ? Heat (radiofrequency energy). ? Laser energy. ? Extreme cold (cryoablation).  When the necessary tissue has been ablated, the catheter will be removed.  Pressure will be held on the catheter insertion area to prevent excessive bleeding.  A bandage (dressing) will be placed over the catheter insertion area. The procedure may vary among health care providers and hospitals. What happens after the procedure?  Your blood pressure, heart rate, breathing rate, and blood oxygen level will be monitored until the medicines you were given have worn off.  Your catheter insertion area will be monitored for bleeding. You will need to lie  still for a few hours to ensure that you do not bleed from the catheter insertion area.  Do not drive for 24 hours or as long as directed by your health care provider. Summary  Cardiac ablation is a procedure to disable (ablate) a small amount of heart tissue in very specific places. Ablating some of the problem areas can improve the heart rhythm or return it to normal.  During the procedure, electrical currents will be sent from the catheter to ablate heart tissue in desired areas. This information is not intended to replace advice given to you by your health care provider. Make sure you discuss any questions you have with your health care provider. Document Released: 03/20/2009 Document Revised: 09/20/2016 Document Reviewed: 09/20/2016 Elsevier Interactive Patient Education  [employer].

## 2017-10-11 NOTE — Progress Notes (Signed)
Patient Care Team: [name], MD as PCP - General (Cardiology) [name], MD as Consulting Physician (Cardiology)   HPI  [name] is a 71 y.o. male Seen in followup of atrial flutter presumed typical and reverse typical    He underwent catheter ablation 10/17. He has had recurrent atrial flutter that appears typical by ECG  Underwent cardioversion 10/18 now w recurrence  He has few sattributable symptoms without palpitations or exercise intolerance,dizziness or chestpain  On apixoban without bleeding   Echo 11/17  Normal LV function with normal LA size   TERF  Age-75, DM-1 HTN-1  CHADSVASc 3   Has had issue of immature granulocytes   No palps  No CP or SOB  Unaware of his tachycardia  Records and Results Reviewed ECGs  1/17 Cr 0.9 K4.1   Date Cr K Hgb  1/17 0.9 4.1   10/18 1.9 4.5 15.2     Past Medical History:  Diagnosis Date  . Atrial flutter ([place])   . COPD (chronic obstructive pulmonary disease) ([place])   . Diabetes mellitus without complication ([place])   . Hyperlipidemia   . Hypertension   . Polycythemia     Past Surgical History:  Procedure Laterality Date  . CARDIOVERSION N/A 09/08/2017   Procedure: CARDIOVERSION;  Surgeon: [name], MD;  Location: [facility] ORS;  Service: Cardiovascular;  Laterality: N/A;  . ELECTROPHYSIOLOGIC STUDY N/A 10/27/2016   Procedure: A-Flutter Ablation;  Surgeon: Deboraha Sprang, MD;  Location: [facility];  Service: Cardiovascular;  Laterality: N/A;  . TEE WITH CARDIOVERSION  12/16    Current Outpatient Medications  Medication Sig Dispense Refill  . acetaminophen (TYLENOL) 500 MG tablet Take 500-1,000 mg by mouth every 6 (six) hours as needed (for pain.).    Marland Kitchen apixaban (ELIQUIS) 5 MG TABS tablet Take 1 tablet (5 mg total) by mouth 2 (two) times daily. 180 tablet 3  . budesonide-formoterol (SYMBICORT) 80-4.5 MCG/ACT inhaler Inhale 1 puff into the lungs 2 times daily at 12 noon and 4 pm.  1 Inhaler 0  . diltiazem (CARDIZEM CD) 180 MG 24 hr capsule Take 1 capsule (180 mg total) by mouth daily. 90 capsule 3  . glucose blood (FREESTYLE LITE) test strip Check blood sugar 2x a day as directed    . losartan (COZAAR) 25 MG tablet Take 25 mg by mouth daily.    . metFORMIN (GLUCOPHAGE) 1000 MG tablet Take 1 tablet (1,000 mg total) by mouth 2 (two) times daily with a meal. 180 tablet 0  . metoprolol succinate (TOPROL-XL) 50 MG 24 hr tablet Take 50 mg daily by mouth. Take with or immediately following a meal.    . rosuvastatin (CRESTOR) 40 MG tablet TAKE 1 TABLET BY MOUTH ONCE DAILY (Patient taking differently: TAKE 1 TABLET BY MOUTH ONCE DAILY AT BEDTIME.) 90 tablet 2  . SPIRIVA HANDIHALER 18 MCG inhalation capsule Place 18 mcg into inhaler and inhale daily.     No current facility-administered medications for this visit.     Allergies  Allergen Reactions  . Levaquin [Levofloxacin In D5w] Rash      Review of Systems negative except from HPI and PMH  Physical Exam BP 122/78 (BP Location: Left Arm, Patient Position: Sitting, Cuff Size: Normal)   Pulse (!) 126   Ht 5\' 7"  (1.702 m)   Wt 206 lb (93.4 kg)   BMI 32.26 kg/m  Well developed and nourished in no acute distress HENT normal Neck  supple with JVP-flat Carotids brisk and full without bruits Clear Rapid but regular rate and rhythm with rapid  ventricular response, no murmurs or gallops Abd-soft with active BS without hepatomegaly No Clubbing cyanosis edema Skin-warm and dry A & Oriented  Grossly normal sensory and motor function   ECG * Atrial flutter appears typical Neg flutter waves inferiorly and biphasic Pos/neg V1  ECG 09/02/16>>Neg flutter waves inferiorly biphasic Neg/Pos V1 ( NB different)   Assessment and  Plan Atrial flutter recurrent  HTN  Renal Insufficiency acute/chronic    Recurrent atrial flutter with difficult to control rate. Have reviewed with patient options including repeated  cardioversion and antiarrhythmic therapy vs repeat ablation.  Given the atypical nature of the flutter waves in V1 will ask colleagues to perform so advance mapping is available if needed  Will continue apixoban  Recheck Cr-.  D[facility]ed with [facility]  He will undertake R sided mapping.  With Cr would prefer to confirm LA if would need CT imaging for mapping with attendant contrast risk

## 2017-10-11 NOTE — Patient Instructions (Signed)
Medication Instructions:   No medication changes made  Labwork:  No new labs needed  Testing/Procedures:  No further testing at this time   Follow-Up: It was a pleasure seeing you in the office today. Please call us if you have new issues that need to be addressed before your next appt.  [phone]  Your physician wants you to follow-up in: 1 month.  You will receive a reminder letter in the mail two months in advance. If you don't receive a letter, please call our office to schedule the follow-up appointment.  If you need a refill on your cardiac medications before your next appointment, please call your pharmacy.

## 2017-10-12 LAB — BASIC METABOLIC PANEL
BUN / CREAT RATIO: 20 (ref 10–24)
BUN: 33 mg/dL — AB (ref 8–27)
CALCIUM: 9 mg/dL (ref 8.6–10.2)
CO2: 22 mmol/L (ref 20–29)
Chloride: 106 mmol/L (ref 96–106)
Creatinine, Ser: 1.67 mg/dL — ABNORMAL HIGH (ref 0.76–1.27)
GFR calc non Af Amer: 40 mL/min/{1.73_m2} — ABNORMAL LOW (ref >59)
GFR, EST AFRICAN AMERICAN: 46 mL/min/{1.73_m2} — AB (ref >59)
Glucose: 223 mg/dL — ABNORMAL HIGH (ref 65–99)
Potassium: 4.8 mmol/L (ref 3.5–5.2)
SODIUM: 140 mmol/L (ref 134–144)

## 2017-10-12 LAB — CBC
HEMOGLOBIN: 15.6 g/dL (ref 13.0–17.7)
Hematocrit: 47 % (ref 37.5–51.0)
MCH: 32.8 pg (ref 26.6–33.0)
MCHC: 33.2 g/dL (ref 31.5–35.7)
MCV: 99 fL — AB (ref 79–97)
PLATELETS: 131 10*3/uL — AB (ref 150–379)
RBC: 4.75 x10E6/uL (ref 4.14–5.80)
RDW: 12.8 % (ref 12.3–15.4)
WBC: 7.9 10*3/uL (ref 3.4–10.8)

## 2017-10-14 ENCOUNTER — Encounter (HOSPITAL_COMMUNITY)
Admission: RE | Disposition: A | Discharge status: Home / Self Care | Payer: Self-pay | Source: Ambulatory Visit | Attending: Cardiology

## 2017-10-14 ENCOUNTER — Other Ambulatory Visit: Payer: Self-pay

## 2017-10-14 ENCOUNTER — Encounter (HOSPITAL_COMMUNITY): Payer: Self-pay | Admitting: Certified Registered Nurse Anesthetist

## 2017-10-14 ENCOUNTER — Ambulatory Visit (HOSPITAL_COMMUNITY)
Admission: RE | Admit: 2017-10-14 | Discharge: 2017-10-15 | Disposition: A | Discharge status: Home / Self Care | Payer: PPO | Source: Ambulatory Visit | Attending: Cardiology | Admitting: Cardiology

## 2017-10-14 ENCOUNTER — Ambulatory Visit (HOSPITAL_COMMUNITY): Payer: PPO | Admitting: Certified Registered Nurse Anesthetist

## 2017-10-14 DIAGNOSIS — N183 Chronic kidney disease, stage 3 (moderate): Secondary | ICD-10-CM | POA: Diagnosis not present

## 2017-10-14 DIAGNOSIS — I4892 Unspecified atrial flutter: Secondary | ICD-10-CM | POA: Diagnosis present

## 2017-10-14 DIAGNOSIS — Z01812 Encounter for preprocedural laboratory examination: Secondary | ICD-10-CM

## 2017-10-14 DIAGNOSIS — I129 Hypertensive chronic kidney disease with stage 1 through stage 4 chronic kidney disease, or unspecified chronic kidney disease: Secondary | ICD-10-CM | POA: Insufficient documentation

## 2017-10-14 DIAGNOSIS — Z9889 Other specified postprocedural states: Secondary | ICD-10-CM

## 2017-10-14 DIAGNOSIS — Z87891 Personal history of nicotine dependence: Secondary | ICD-10-CM | POA: Diagnosis not present

## 2017-10-14 DIAGNOSIS — Z8679 Personal history of other diseases of the circulatory system: Secondary | ICD-10-CM

## 2017-10-14 DIAGNOSIS — E1122 Type 2 diabetes mellitus with diabetic chronic kidney disease: Secondary | ICD-10-CM | POA: Diagnosis not present

## 2017-10-14 DIAGNOSIS — J449 Chronic obstructive pulmonary disease, unspecified: Secondary | ICD-10-CM | POA: Diagnosis not present

## 2017-10-14 DIAGNOSIS — I10 Essential (primary) hypertension: Secondary | ICD-10-CM | POA: Diagnosis present

## 2017-10-14 DIAGNOSIS — Z7984 Long term (current) use of oral hypoglycemic drugs: Secondary | ICD-10-CM | POA: Insufficient documentation

## 2017-10-14 HISTORY — DX: Chronic kidney disease, stage 3 (moderate): N18.3

## 2017-10-14 HISTORY — PX: A-FLUTTER ABLATION: EP1230

## 2017-10-14 HISTORY — DX: Chronic kidney disease, stage 3 unspecified: N18.30

## 2017-10-14 LAB — GLUCOSE, CAPILLARY
GLUCOSE-CAPILLARY: 172 mg/dL — AB (ref 65–99)
GLUCOSE-CAPILLARY: 180 mg/dL — AB (ref 65–99)
GLUCOSE-CAPILLARY: 290 mg/dL — AB (ref 65–99)
Glucose-Capillary: 158 mg/dL — ABNORMAL HIGH (ref 65–99)
Glucose-Capillary: 185 mg/dL — ABNORMAL HIGH (ref 65–99)

## 2017-10-14 SURGERY — A-FLUTTER ABLATION
Anesthesia: General

## 2017-10-14 MED ORDER — MEPERIDINE HCL 25 MG/ML IJ SOLN: 6.2500 mg | INTRAMUSCULAR | Status: DC | PRN

## 2017-10-14 MED ORDER — ONDANSETRON HCL 4 MG/2ML IJ SOLN: 4.0000 mg | Freq: Four times a day (QID) | INTRAMUSCULAR | Status: DC | PRN

## 2017-10-14 MED ORDER — SODIUM CHLORIDE 0.9% FLUSH: 3.0000 mL | Freq: Two times a day (BID) | INTRAVENOUS | Status: DC

## 2017-10-14 MED ORDER — OXYCODONE HCL 5 MG PO TABS: 5.0000 mg | ORAL_TABLET | Freq: Once | ORAL | Status: DC | PRN

## 2017-10-14 MED ORDER — PHENYLEPHRINE HCL 10 MG/ML IJ SOLN
INTRAMUSCULAR | Status: DC | PRN
  Administered 2017-10-14 (×2): 80 ug via INTRAVENOUS
  Administered 2017-10-14: 120 ug via INTRAVENOUS

## 2017-10-14 MED ORDER — BUPIVACAINE HCL (PF) 0.25 % IJ SOLN
INTRAMUSCULAR | Status: DC | PRN
  Administered 2017-10-14: 30 mL

## 2017-10-14 MED ORDER — SODIUM CHLORIDE 0.9 % IV SOLN
INTRAVENOUS | Status: DC
  Administered 2017-10-14: 10:00:00 via INTRAVENOUS

## 2017-10-14 MED ORDER — CYANOCOBALAMIN 500 MCG PO TABS
500.0000 ug | ORAL_TABLET | Freq: Every day | ORAL | Status: DC
  Administered 2017-10-15: 500 ug via ORAL
  Filled 2017-10-14: qty 1

## 2017-10-14 MED ORDER — LACTATED RINGERS IV SOLN
INTRAVENOUS | Status: DC | PRN
  Administered 2017-10-14: 11:00:00 via INTRAVENOUS

## 2017-10-14 MED ORDER — LIDOCAINE HCL (CARDIAC) 20 MG/ML IV SOLN
INTRAVENOUS | Status: DC | PRN
  Administered 2017-10-14: 80 mg via INTRAVENOUS

## 2017-10-14 MED ORDER — INSULIN ASPART 100 UNIT/ML ~~LOC~~ SOLN
0.0000 [IU] | SUBCUTANEOUS | Status: DC
  Administered 2017-10-14: 3 [IU] via SUBCUTANEOUS
  Administered 2017-10-14: 8 [IU] via SUBCUTANEOUS
  Administered 2017-10-14 – 2017-10-15 (×4): 3 [IU] via SUBCUTANEOUS
  Administered 2017-10-15: 5 [IU] via SUBCUTANEOUS

## 2017-10-14 MED ORDER — LOSARTAN POTASSIUM 25 MG PO TABS
25.0000 mg | ORAL_TABLET | Freq: Every day | ORAL | Status: DC
  Administered 2017-10-15: 25 mg via ORAL
  Filled 2017-10-14: qty 1

## 2017-10-14 MED ORDER — PROPOFOL 10 MG/ML IV BOLUS
INTRAVENOUS | Status: DC | PRN
  Administered 2017-10-14: 200 mg via INTRAVENOUS

## 2017-10-14 MED ORDER — ROSUVASTATIN CALCIUM 40 MG PO TABS
40.0000 mg | ORAL_TABLET | Freq: Every day | ORAL | Status: DC
  Administered 2017-10-14: 40 mg via ORAL
  Filled 2017-10-14: qty 1

## 2017-10-14 MED ORDER — BUPIVACAINE HCL (PF) 0.25 % IJ SOLN
INTRAMUSCULAR | Status: AC
Start: 2017-10-14 — End: 2017-10-14
  Filled 2017-10-14: qty 30

## 2017-10-14 MED ORDER — HYDROMORPHONE HCL 1 MG/ML IJ SOLN: 0.2500 mg | INTRAMUSCULAR | Status: DC | PRN

## 2017-10-14 MED ORDER — SODIUM CHLORIDE 0.9% FLUSH: 3.0000 mL | INTRAVENOUS | Status: DC | PRN

## 2017-10-14 MED ORDER — HEPARIN (PORCINE) IN NACL 2-0.9 UNIT/ML-% IJ SOLN
INTRAMUSCULAR | Status: AC
  Filled 2017-10-14: qty 500

## 2017-10-14 MED ORDER — FENTANYL CITRATE (PF) 100 MCG/2ML IJ SOLN
INTRAMUSCULAR | Status: DC | PRN
  Administered 2017-10-14 (×2): 25 ug via INTRAVENOUS
  Administered 2017-10-14: 50 ug via INTRAVENOUS

## 2017-10-14 MED ORDER — DEXTROSE 5 % IV SOLN
INTRAVENOUS | Status: DC | PRN
  Administered 2017-10-14: 30 ug/min via INTRAVENOUS

## 2017-10-14 MED ORDER — SODIUM CHLORIDE 0.9 % IV SOLN: 250.0000 mL | INTRAVENOUS | Status: DC | PRN

## 2017-10-14 MED ORDER — ONDANSETRON HCL 4 MG/2ML IJ SOLN
INTRAMUSCULAR | Status: DC | PRN
  Administered 2017-10-14: 4 mg via INTRAVENOUS

## 2017-10-14 MED ORDER — ACETAMINOPHEN 500 MG PO TABS: 500.0000 mg | ORAL_TABLET | Freq: Four times a day (QID) | ORAL | Status: DC | PRN

## 2017-10-14 MED ORDER — PROMETHAZINE HCL 25 MG/ML IJ SOLN: 6.2500 mg | INTRAMUSCULAR | Status: DC | PRN

## 2017-10-14 MED ORDER — ALBUTEROL SULFATE HFA 108 (90 BASE) MCG/ACT IN AERS
INHALATION_SPRAY | RESPIRATORY_TRACT | Status: DC | PRN
  Administered 2017-10-14: 4 via RESPIRATORY_TRACT

## 2017-10-14 MED ORDER — APIXABAN 5 MG PO TABS
5.0000 mg | ORAL_TABLET | Freq: Two times a day (BID) | ORAL | Status: DC
Start: 2017-10-14 — End: 2017-10-15
  Administered 2017-10-14 – 2017-10-15 (×2): 5 mg via ORAL
  Filled 2017-10-14 (×2): qty 1

## 2017-10-14 MED ORDER — DEXAMETHASONE SODIUM PHOSPHATE 10 MG/ML IJ SOLN
INTRAMUSCULAR | Status: DC | PRN
  Administered 2017-10-14: 4 mg via INTRAVENOUS

## 2017-10-14 MED ORDER — ACETAMINOPHEN 325 MG PO TABS: 650.0000 mg | ORAL_TABLET | ORAL | Status: DC | PRN

## 2017-10-14 MED ORDER — METOPROLOL SUCCINATE ER 50 MG PO TB24
50.0000 mg | ORAL_TABLET | Freq: Every evening | ORAL | Status: DC
  Administered 2017-10-14: 50 mg via ORAL
  Filled 2017-10-14: qty 1

## 2017-10-14 MED ORDER — HEPARIN (PORCINE) IN NACL 2-0.9 UNIT/ML-% IJ SOLN
INTRAMUSCULAR | Status: AC | PRN
  Administered 2017-10-14: 1000 mL

## 2017-10-14 MED ORDER — METFORMIN HCL 500 MG PO TABS: 1000.0000 mg | ORAL_TABLET | Freq: Two times a day (BID) | ORAL | Status: DC

## 2017-10-14 MED ORDER — DILTIAZEM HCL ER COATED BEADS 180 MG PO CP24
180.0000 mg | ORAL_CAPSULE | Freq: Every day | ORAL | Status: DC
  Administered 2017-10-15: 180 mg via ORAL
  Filled 2017-10-14: qty 1

## 2017-10-14 MED ORDER — OXYCODONE HCL 5 MG/5ML PO SOLN: 5.0000 mg | Freq: Once | ORAL | Status: DC | PRN

## 2017-10-14 MED ORDER — MOMETASONE FURO-FORMOTEROL FUM 100-5 MCG/ACT IN AERO
2.0000 | INHALATION_SPRAY | Freq: Two times a day (BID) | RESPIRATORY_TRACT | Status: DC
  Administered 2017-10-15: 2 via RESPIRATORY_TRACT
  Filled 2017-10-14: qty 8.8

## 2017-10-14 SURGICAL SUPPLY — 11 items
BLANKET WARM UNDERBOD FULL ACC (MISCELLANEOUS) ×3 IMPLANT
CATH EZ STEER NAV 8MM D-F CUR (ABLATOR) ×3 IMPLANT
CATH JOSEPH QUAD ALLRED 6F REP (CATHETERS) ×3 IMPLANT
CATH WEBSTER BI DIR CS D-F CRV (CATHETERS) ×3 IMPLANT
PACK EP LATEX FREE (CUSTOM PROCEDURE TRAY) ×2
PACK EP LF (CUSTOM PROCEDURE TRAY) ×1 IMPLANT
PAD DEFIB LIFELINK (PAD) ×3 IMPLANT
PATCH CARTO3 (PAD) ×3 IMPLANT
SHEATH PINNACLE 6F 10CM (SHEATH) ×3 IMPLANT
SHEATH PINNACLE 7F 10CM (SHEATH) ×3 IMPLANT
SHEATH PINNACLE 8F 10CM (SHEATH) ×3 IMPLANT

## 2017-10-14 NOTE — Progress Notes (Signed)
PT bedrest over, site remains level 0.

## 2017-10-14 NOTE — CV Procedure (Signed)
Site area: Right Femoral Vein Site Prior to Removal: Level 0 Pressure Applied For: 15 mins Manual: yes Patient Status During Pull: stable Post Pull Site: level 0 Post Pull Instructions Given: yes Post Pull Pulses Present: DP 2+ Dressing Applied: gauze and tegaderm Bedrest begins @ 4835 Comments: n/a

## 2017-10-14 NOTE — Transfer of Care (Signed)
Immediate Anesthesia Transfer of Care Note  Patient: [name]  Procedure(s) Performed: A-FLUTTER ABLATION (N/A )  Patient Location: PACU  Anesthesia Type:General  Level of Consciousness: awake and alert   Airway & Oxygen Therapy: Patient Spontanous Breathing and Patient connected to nasal cannula oxygen  Post-op Assessment: Report given to RN and Post -op Vital signs reviewed and stable  Post vital signs: Reviewed and stable  Last Vitals:  Vitals:   10/14/17 0853  BP: (!) 118/95  Pulse: (!) 123  Temp: (!) 36.4 C  SpO2: 95%    Last Pain:  Vitals:   10/14/17 0853  TempSrc: Oral         Complications: No apparent anesthesia complications

## 2017-10-14 NOTE — H&P (Signed)
[name] is a 70 y.o. male with a history of atrial flutter. He had a prior ablation in 2017 with recurrence. He presents for ablation. On exam, tachycardic, regular, no murmurs, lungs clear. Risks and benefits discussed. Risks include but not limited to bleeding, tamponade, heart block, stroke. The patient understands the risks and has agreed to the procedure. Should the flutter be left atrial, plan for cardioversion with possibly LA ablation in the future.  Norvel Wenker Curt Bears, MD 10/14/2017 10:37 AM

## 2017-10-14 NOTE — Anesthesia Preprocedure Evaluation (Signed)
Anesthesia Evaluation  Patient identified by MRN, date of birth, ID band Patient awake    Reviewed: Allergy & Precautions, H&P , NPO status , Patient's Chart, lab work & pertinent test results, reviewed documented beta blocker date and time   Airway Mallampati: II  TM Distance: >3 FB Neck ROM: Full    Dental no notable dental hx. (+) Poor Dentition, Dental Advisory Given   Pulmonary neg pulmonary ROS, COPD,  COPD inhaler, former smoker,    Pulmonary exam normal breath sounds clear to auscultation       Cardiovascular Exercise Tolerance: Good hypertension, Pt. on medications and Pt. on home beta blockers negative cardio ROS   Rhythm:Irregular Rate:Normal     Neuro/Psych negative neurological ROS  negative psych ROS   GI/Hepatic negative GI ROS, Neg liver ROS,   Endo/Other  negative endocrine ROSdiabetes, Type 2, Oral Hypoglycemic Agents  Renal/GU negative Renal ROS  negative genitourinary   Musculoskeletal   Abdominal   Peds  Hematology negative hematology ROS (+)   Anesthesia Other Findings   Reproductive/Obstetrics negative OB ROS                             Anesthesia Physical  Anesthesia Plan  ASA: III  Anesthesia Plan: General   Post-op Pain Management:    Induction: Intravenous  PONV Risk Score and Plan: 2 and Ondansetron and Midazolam  Airway Management Planned: Oral ETT and LMA  Additional Equipment:   Intra-op Plan:   Post-operative Plan: Extubation in OR  Informed Consent: I have reviewed the patients History and Physical, chart, labs and discussed the procedure including the risks, benefits and alternatives for the proposed anesthesia with the patient or authorized representative who has indicated his/her understanding and acceptance.   Dental advisory given  Plan Discussed with: CRNA  Anesthesia Plan Comments:         Anesthesia Quick Evaluation

## 2017-10-14 NOTE — Anesthesia Procedure Notes (Signed)
Procedure Name: LMA Insertion Date/Time: 10/14/2017 11:16 AM Performed by: Inda Coke, CRNA Pre-anesthesia Checklist: Patient identified, Emergency Drugs available, Suction available and Patient being monitored Patient Re-evaluated:Patient Re-evaluated prior to induction Oxygen Delivery Method: Circle System Utilized Preoxygenation: Pre-oxygenation with 100% oxygen Induction Type: IV induction Ventilation: Mask ventilation without difficulty LMA: LMA inserted LMA Size: 5.0 Number of attempts: 1 Airway Equipment and Method: Bite block Placement Confirmation: positive ETCO2 and breath sounds checked- equal and bilateral Tube secured with: Tape Dental Injury: Teeth and Oropharynx as per pre-operative assessment

## 2017-10-15 ENCOUNTER — Encounter (HOSPITAL_COMMUNITY): Payer: Self-pay | Admitting: Physician Assistant

## 2017-10-15 DIAGNOSIS — J449 Chronic obstructive pulmonary disease, unspecified: Secondary | ICD-10-CM | POA: Diagnosis not present

## 2017-10-15 DIAGNOSIS — N183 Chronic kidney disease, stage 3 (moderate): Secondary | ICD-10-CM | POA: Diagnosis not present

## 2017-10-15 DIAGNOSIS — Z8679 Personal history of other diseases of the circulatory system: Secondary | ICD-10-CM

## 2017-10-15 DIAGNOSIS — I4892 Unspecified atrial flutter: Secondary | ICD-10-CM | POA: Diagnosis not present

## 2017-10-15 DIAGNOSIS — I129 Hypertensive chronic kidney disease with stage 1 through stage 4 chronic kidney disease, or unspecified chronic kidney disease: Secondary | ICD-10-CM | POA: Diagnosis not present

## 2017-10-15 DIAGNOSIS — Z7984 Long term (current) use of oral hypoglycemic drugs: Secondary | ICD-10-CM | POA: Diagnosis not present

## 2017-10-15 DIAGNOSIS — I483 Typical atrial flutter: Secondary | ICD-10-CM

## 2017-10-15 DIAGNOSIS — Z9889 Other specified postprocedural states: Secondary | ICD-10-CM

## 2017-10-15 DIAGNOSIS — E1122 Type 2 diabetes mellitus with diabetic chronic kidney disease: Secondary | ICD-10-CM | POA: Diagnosis not present

## 2017-10-15 DIAGNOSIS — Z87891 Personal history of nicotine dependence: Secondary | ICD-10-CM | POA: Diagnosis not present

## 2017-10-15 LAB — GLUCOSE, CAPILLARY
GLUCOSE-CAPILLARY: 196 mg/dL — AB (ref 65–99)
GLUCOSE-CAPILLARY: 203 mg/dL — AB (ref 65–99)
Glucose-Capillary: 189 mg/dL — ABNORMAL HIGH (ref 65–99)
Glucose-Capillary: 193 mg/dL — ABNORMAL HIGH (ref 65–99)

## 2017-10-15 MED ORDER — METFORMIN HCL 500 MG PO TABS
1000.0000 mg | ORAL_TABLET | Freq: Two times a day (BID) | ORAL | Status: DC
  Administered 2017-10-15: 1000 mg via ORAL
  Filled 2017-10-15: qty 2

## 2017-10-15 NOTE — Progress Notes (Signed)
Progress Note  Patient Name: [name] Date of Encounter: 10/15/2017  Primary Cardiologist: Dr. Renaldo Reel  Subjective   No chest pain or sob.   Inpatient Medications    Scheduled Meds: . apixaban  5 mg Oral BID  . cyanocobalamin  500 mcg Oral Daily  . diltiazem  180 mg Oral Daily  . insulin aspart  0-15 Units Subcutaneous Q4H  . losartan  25 mg Oral Daily  . metFORMIN  1,000 mg Oral BID WC  . metoprolol succinate  50 mg Oral QPM  . mometasone-formoterol  2 puff Inhalation BID  . rosuvastatin  40 mg Oral QHS  . sodium chloride flush  3 mL Intravenous Q12H   Continuous Infusions: . sodium chloride     PRN Meds: sodium chloride, acetaminophen, ondansetron (ZOFRAN) IV, sodium chloride flush   Vital Signs    Vitals:   10/14/17 2043 10/15/17 0022 10/15/17 0429 10/15/17 0904  BP: 116/60 106/66 127/65 116/62  Pulse: 96 85 86 82  Resp: 18 18 18    Temp: 98.3 F (36.8 C) 98 F (36.7 C) 98.2 F (36.8 C)   TempSrc: Oral Oral Oral   SpO2: 94% 95% 95% 95%  Weight:   196 lb 9.6 oz (89.2 kg)   Height:        Intake/Output Summary (Last 24 hours) at 10/15/2017 0909 Last data filed at 10/15/2017 0553 Gross per 24 hour  Intake 1200 ml  Output 452 ml  Net 748 ml   Filed Weights   10/14/17 0853 10/15/17 0429  Weight: 206 lb (93.4 kg) 196 lb 9.6 oz (89.2 kg)    Telemetry    nsr - Personally Reviewed  ECG    nsr - Personally Reviewed  Physical Exam   GEN: No acute distress.   Neck: No JVD Cardiac: RRR, no murmurs, rubs, or gallops.  Respiratory: Clear to auscultation bilaterally. GI: Soft, nontender, non-distended  MS: No edema; No deformity. Neuro:  Nonfocal  Psych: Normal affect   Labs    Chemistry Recent Labs  Lab 10/11/17 0932  NA 140  K 4.8  CL 106  CO2 22  GLUCOSE 223*  BUN 33*  CREATININE 1.67*  CALCIUM 9.0  GFRNONAA 40*  GFRAA 46*     Hematology Recent Labs  Lab 10/11/17 0932  WBC 7.9  RBC 4.75  HGB 15.6  HCT 47.0  MCV 99*   MCH 32.8  MCHC 33.2  RDW 12.8  PLT 131*    Cardiac EnzymesNo results for input(s): TROPONINI in the last 168 hours. No results for input(s): TROPIPOC in the last 168 hours.   BNPNo results for input(s): BNP, PROBNP in the last 168 hours.   DDimer No results for input(s): DDIMER in the last 168 hours.   Radiology    No results found.  Cardiac Studies   none  Patient Profile     71 y.o. male admitted for repeat EPS/RFA of atrial flutter  Assessment & Plan    1. Atrial flutter - he is s/p redo ablation and is doing well. Continue current meds and DC home. followup in [place] with Dr. Renaldo Reel in 2-4 weeks 2. COPD - he is stable. Continue current meds. 3. HTN - continue current meds. 4. Disp. - ok for DC home with followup as above.  Shadow Stiggers,M.D.  For questions or updates, please contact [place] Please consult [email or web address] for contact info under Cardiology/STEMI.      Signed, Carlos Peru, MD  10/15/2017, 9:09 AM  [name], male   DOB: 1946-04-23, 71 y.o.   MRN: [id number]

## 2017-10-15 NOTE — Plan of Care (Signed)
Discharge instructions reviewed with patient, questions answered, verbalized understanding.  Went over care of ablation site as well .  Patient verbalized understanding.  Patient transported to main entrance to be taken home by niece.

## 2017-10-15 NOTE — Discharge Instructions (Signed)
No driving for 4 days. No lifting over 5 lbs for 1 week. No sexual activity for 1 week. You may return to work in 1 week. Keep procedure site clean & dry. If you notice increased pain, swelling, bleeding or pus, call/return!  You may shower, but no soaking baths/hot tubs/pools for 1 week.  ° ° °

## 2017-10-15 NOTE — Discharge Summary (Signed)
ELECTROPHYSIOLOGY PROCEDURE DISCHARGE SUMMARY    [name],  MRN: [id number], DOB/AGE: [id number] y.o.  Admit date: 10/14/2017 Discharge date: 10/15/2017  Primary Care Physician: [name], MD Primary Cardiologist: Rockey Situ Electrophysiologist: Caryl Comes  Primary Discharge Diagnosis:  Atrial flutter status post ablation this admission  Secondary Discharge Diagnosis:  1.  HTN 2.  COPD 3.  Diabetes 4.  CKD III  Allergies  Allergen Reactions  . Levaquin [Levofloxacin In D5w] Swelling and Rash     Procedures This Admission: 1.  Electrophysiology study and radiofrequency catheter ablation on 10/14/17 by Dr Curt Bears.  This study demonstrated typical atrial flutter with successful CTI ablation.  There were no inducible arrhythmias following ablation and no early apparent complications.   Brief HPI: [name] is a 71 y.o. male with a past medical history as outlined above with COPD, DM, HTN, HLD, polycythemia, CKD stage III per labs and has documented atrial flutter. He has been appropriately anticoagulated for >4 weeks.  Risks, benefits, and alternatives to ablation were reviewed with the patient who wished to proceed.   Hospital Course:  The patient was admitted and underwent EPS/RFCA with details as outlined above. He was monitored on telemetry overnight which demonstrated NSR.  Groin  Incision was without complication.  They were examined by Dr Lovena Le who considered them stable for discharge to home.  Dr. Lovena Le recommended f/u within 2-4 weeks. Tentative appt was scheduled for 1/10 but I sent a message to scheduler to move up in accordance with Dr. Tanna Furry recommendation. Wound care and restrictions were outlined for the patient.  Physical Exam: Vitals:   10/14/17 2043 10/15/17 0022 10/15/17 0429 10/15/17 0904  BP: 116/60 106/66 127/65 116/62  Pulse: 96 85 86 82  Resp: 18 18 18    Temp: 98.3 F (36.8 C) 98 F (36.7 C) 98.2 F (36.8  C)   TempSrc: Oral Oral Oral   SpO2: 94% 95% 95% 95%  Weight:   196 lb 9.6 oz (89.2 kg)   Height:        Exam per Dr. Tanna Furry note.  Labs:   Lab Results  Component Value Date   WBC 7.9 10/11/2017   HGB 15.6 10/11/2017   HCT 47.0 10/11/2017   MCV 99 (H) 10/11/2017   PLT 131 (L) 10/11/2017    Recent Labs  Lab 10/11/17 0932  NA 140  K 4.8  CL 106  CO2 22  BUN 33*  CREATININE 1.67*  CALCIUM 9.0  GLUCOSE 223*    Discharge Medications:  Allergies as of 10/15/2017      Reactions   Levaquin [levofloxacin In D5w] Swelling, Rash      Medication List    TAKE these medications   acetaminophen 500 MG tablet Commonly known as:  TYLENOL Take 500-1,000 mg by mouth every 6 (six) hours as needed (for pain.).   apixaban 5 MG Tabs tablet Commonly known as:  ELIQUIS Take 1 tablet (5 mg total) by mouth 2 (two) times daily.   budesonide-formoterol 80-4.5 MCG/ACT inhaler Commonly known as:  SYMBICORT Inhale 1 puff into the lungs 2 times daily at 12 noon and 4 pm.   diltiazem 180 MG 24 hr capsule Commonly known as:  CARDIZEM CD Take 1 capsule (180 mg total) by mouth daily.   FREESTYLE LITE test strip Generic drug:  glucose blood Check blood sugar 2x a day as directed   losartan 25 MG tablet Commonly known as:  COZAAR Take 25 mg by mouth  daily.   metFORMIN 1000 MG tablet Commonly known as:  GLUCOPHAGE Take 1 tablet (1,000 mg total) by mouth 2 (two) times daily with a meal.   metoprolol succinate 50 MG 24 hr tablet Commonly known as:  TOPROL-XL Take 50 mg by mouth every evening. Take with or immediately following a meal.   rosuvastatin 40 MG tablet Commonly known as:  CRESTOR TAKE 1 TABLET BY MOUTH ONCE DAILY What changed:    how much to take  how to take this  when to take this   vitamin B-12 500 MCG tablet Commonly known as:  CYANOCOBALAMIN Take 500 mcg by mouth daily.       Disposition:  Discharge Instructions    Diet - low sodium heart healthy    Complete by:  As directed    Increase activity slowly   Complete by:  As directed    No driving for 2 days. No lifting over 5 lbs for 1 week. No sexual activity for 1 week. You may return to work in 1 week if applicable. Keep procedure site clean & dry. If you notice increased pain, swelling, bleeding or pus, call/return!  You may shower, but no soaking baths/hot tubs/pools for 1 week.     Follow-up Information    Deboraha Sprang, MD Follow up.   Specialty:  Cardiology Why:  This is tentatively scheduled for January as below. We've sent a message to the office to try to move this up within 2-4 weeks. Contact information: [place] [address] [phone]           Duration of Discharge Encounter: Greater than 30 minutes including physician time.  Signed, Chanetta Marshall, NP 10/15/2017 6:13 AM  Addendum made with updated changes to above summary. Dayna Dunn PA-C 10:48 AM   EP Attending  Patient seen and examined. Agree with above. He is stable for DC home with usual followup.   Mikle Bosworth.D.

## 2017-10-17 NOTE — Anesthesia Postprocedure Evaluation (Signed)
Anesthesia Post Note  Patient: Seeley Hissong  Procedure(s) Performed: A-FLUTTER ABLATION (N/A )     Patient location during evaluation: PACU Anesthesia Type: General Level of consciousness: awake and alert Pain management: pain level controlled Vital Signs Assessment: post-procedure vital signs reviewed and stable Respiratory status: spontaneous breathing, nonlabored ventilation, respiratory function stable and patient connected to nasal cannula oxygen Cardiovascular status: blood pressure returned to baseline and stable Postop Assessment: no apparent nausea or vomiting Anesthetic complications: no    Last Vitals:  Vitals:   10/15/17 0429 10/15/17 0904  BP: 127/65 116/62  Pulse: 86 82  Resp: 18   Temp: 36.8 C   SpO2: 95% 95%    Last Pain:  Vitals:   10/15/17 0740  TempSrc:   PainSc: 0-No pain                 Lynda Rainwater

## 2017-10-18 DIAGNOSIS — N183 Chronic kidney disease, stage 3 (moderate): Secondary | ICD-10-CM | POA: Diagnosis not present

## 2017-10-18 DIAGNOSIS — E279 Disorder of adrenal gland, unspecified: Secondary | ICD-10-CM | POA: Diagnosis not present

## 2017-10-18 DIAGNOSIS — E1122 Type 2 diabetes mellitus with diabetic chronic kidney disease: Secondary | ICD-10-CM | POA: Diagnosis not present

## 2017-10-18 DIAGNOSIS — E781 Pure hyperglyceridemia: Secondary | ICD-10-CM | POA: Diagnosis not present

## 2017-10-18 DIAGNOSIS — E1165 Type 2 diabetes mellitus with hyperglycemia: Secondary | ICD-10-CM | POA: Diagnosis not present

## 2017-10-18 DIAGNOSIS — E538 Deficiency of other specified B group vitamins: Secondary | ICD-10-CM | POA: Diagnosis not present

## 2017-10-18 DIAGNOSIS — E785 Hyperlipidemia, unspecified: Secondary | ICD-10-CM | POA: Diagnosis not present

## 2017-10-26 DIAGNOSIS — N2581 Secondary hyperparathyroidism of renal origin: Secondary | ICD-10-CM | POA: Diagnosis not present

## 2017-10-26 DIAGNOSIS — R809 Proteinuria, unspecified: Secondary | ICD-10-CM | POA: Diagnosis not present

## 2017-10-26 DIAGNOSIS — N183 Chronic kidney disease, stage 3 (moderate): Secondary | ICD-10-CM | POA: Diagnosis not present

## 2017-10-26 DIAGNOSIS — I129 Hypertensive chronic kidney disease with stage 1 through stage 4 chronic kidney disease, or unspecified chronic kidney disease: Secondary | ICD-10-CM | POA: Diagnosis not present

## 2017-10-26 DIAGNOSIS — E1122 Type 2 diabetes mellitus with diabetic chronic kidney disease: Secondary | ICD-10-CM | POA: Diagnosis not present

## 2017-11-01 ENCOUNTER — Encounter: Payer: Self-pay | Admitting: Internal Medicine

## 2017-11-01 ENCOUNTER — Ambulatory Visit: Payer: PPO | Admitting: Internal Medicine

## 2017-11-01 VITALS — BP 112/58 | HR 80 | Ht 67.0 in | Wt 203.5 lb

## 2017-11-01 DIAGNOSIS — I4892 Unspecified atrial flutter: Secondary | ICD-10-CM | POA: Diagnosis not present

## 2017-11-01 NOTE — Progress Notes (Signed)
Patient Care Team: [name], MD as PCP - General (Cardiology) [name], MD as Consulting Physician (Cardiology)   HPI  [name] is a 71 y.o. male Seen in followup of atrial flutter presumed typical and reverse typical    He underwent catheter ablation 10/17. He has had recurrent atrial flutter that appears atypical by ECG--underwent cardioversion with recurrence and then he underwent repeat ablation 11/18 Dr. Carlyn Reichert with a break in his isthmus line  On apixoban without bleeding   Echo 11/17  Normal LV function with normal LA size   TERF  Age-15, DM-1 HTN-1  CHADSVASc 3   No interval AFib    No bleeding   The patient denies chest pain, shortness of breath, nocturnal dyspnea, orthopnea or peripheral edema.  There have been no palpitations, lightheadedness or syncope.      Records and Results Reviewed ECGs     Date Cr K Hgb  1/17 0.9 4.1   10/18 1.9 4.5 15.2     Past Medical History:  Diagnosis Date  . Atrial flutter ([place])    a. s/p ablation 09/2017.  . CKD (chronic kidney disease), stage III ([place])   . COPD (chronic obstructive pulmonary disease) ([place])   . Diabetes mellitus without complication ([place])   . Hyperlipidemia   . Hypertension   . Polycythemia     Past Surgical History:  Procedure Laterality Date  . A-FLUTTER ABLATION N/A 10/14/2017   Procedure: A-FLUTTER ABLATION;  Surgeon: Constance Haw, MD;  Location: [facility];  Service: Cardiovascular;  Laterality: N/A;  . CARDIOVERSION N/A 09/08/2017   Procedure: CARDIOVERSION;  Surgeon: [name], MD;  Location: [facility];  Service: Cardiovascular;  Laterality: N/A;  . ELECTROPHYSIOLOGIC STUDY N/A 10/27/2016   Procedure: A-Flutter Ablation;  Surgeon: Deboraha Sprang, MD;  Location: [place] [facility];  Service: Cardiovascular;  Laterality: N/A;  . TEE WITH CARDIOVERSION  12/16    Current Outpatient Medications  Medication Sig Dispense Refill  . acetaminophen  (TYLENOL) 500 MG tablet Take 500-1,000 mg by mouth every 6 (six) hours as needed (for pain.).    Marland Kitchen apixaban (ELIQUIS) 5 MG TABS tablet Take 1 tablet (5 mg total) by mouth 2 (two) times daily. 180 tablet 3  . budesonide-formoterol (SYMBICORT) 80-4.5 MCG/ACT inhaler Inhale 1 puff into the lungs 2 times daily at 12 noon and 4 pm. 1 Inhaler 0  . diltiazem (CARDIZEM CD) 180 MG 24 hr capsule Take 1 capsule (180 mg total) by mouth daily. 90 capsule 3  . glucose blood (FREESTYLE LITE) test strip Check blood sugar 2x a day as directed    . losartan (COZAAR) 25 MG tablet Take 25 mg by mouth daily.    . metFORMIN (GLUCOPHAGE) 1000 MG tablet Take 1 tablet (1,000 mg total) by mouth 2 (two) times daily with a meal. 180 tablet 0  . metoprolol succinate (TOPROL-XL) 50 MG 24 hr tablet Take 50 mg by mouth every evening. Take with or immediately following a meal.     . rosuvastatin (CRESTOR) 40 MG tablet TAKE 1 TABLET BY MOUTH ONCE DAILY (Patient taking differently: TAKE 1 TABLET BY MOUTH ONCE DAILY AT BEDTIME.) 90 tablet 2  . vitamin B-12 (CYANOCOBALAMIN) 500 MCG tablet Take 500 mcg by mouth daily.     No current facility-administered medications for this visit.     Allergies  Allergen Reactions  . Levaquin [Levofloxacin In D5w] Swelling and Rash  Review of Systems negative except from HPI and PMH  Physical Exam BP (!) 112/58 (BP Location: Left Arm, Patient Position: Sitting, Cuff Size: Normal)   Pulse 80   Ht 5\' 7"  (1.702 m)   Wt 203 lb 8 oz (92.3 kg)   BMI 31.87 kg/m  Well developed and nourished in no acute distress HENT normal Neck supple with JVP-flat Carotids brisk and full without bruits Clear Regular rate and rhythm, no murmurs or gallops Abd-soft with active BS without hepatomegaly No Clubbing cyanosis edema Skin-warm and dry A & Oriented  Grossly normal sensory and motor function   ECG  sinus @ 80 18/11/38   Assessment and  Plan Atrial flutter recurrent  S/p repeat ablation    HTN  Well controlled   Will stop apixoban   Will stop dilt  followup with TG and EP PRN

## 2017-11-01 NOTE — Patient Instructions (Signed)
Medication Instructions: - Your physician has recommended you make the following change in your medication:  1) STOP Eliquis 2) STOP Cardizem (diltiazem)  Labwork: - none ordered  Procedures/Testing: - none ordered  Follow-Up: - Dr. Caryl Comes will see you back on an as needed basis.   Any Additional Special Instructions Will Be Listed Below (If Applicable).     If you need a refill on your cardiac medications before your next appointment, please call your pharmacy.

## 2017-11-04 ENCOUNTER — Other Ambulatory Visit: Payer: Self-pay | Admitting: Internal Medicine

## 2017-11-04 DIAGNOSIS — E7849 Other hyperlipidemia: Secondary | ICD-10-CM | POA: Diagnosis not present

## 2017-11-04 DIAGNOSIS — Z125 Encounter for screening for malignant neoplasm of prostate: Secondary | ICD-10-CM | POA: Diagnosis not present

## 2017-11-04 DIAGNOSIS — R5381 Other malaise: Secondary | ICD-10-CM | POA: Diagnosis not present

## 2017-11-04 DIAGNOSIS — I10 Essential (primary) hypertension: Secondary | ICD-10-CM | POA: Diagnosis not present

## 2017-11-04 DIAGNOSIS — D168 Benign neoplasm of pelvic bones, sacrum and coccyx: Secondary | ICD-10-CM

## 2017-11-04 DIAGNOSIS — R140 Abdominal distension (gaseous): Secondary | ICD-10-CM

## 2017-11-04 DIAGNOSIS — M898X8 Other specified disorders of bone, other site: Secondary | ICD-10-CM

## 2017-11-10 DIAGNOSIS — J449 Chronic obstructive pulmonary disease, unspecified: Secondary | ICD-10-CM | POA: Diagnosis not present

## 2017-11-16 ENCOUNTER — Ambulatory Visit
Admission: RE | Admit: 2017-11-16 | Discharge: 2017-11-16 | Disposition: A | Discharge status: Home / Self Care | Payer: PPO | Source: Ambulatory Visit | Attending: Internal Medicine | Admitting: Internal Medicine

## 2017-11-16 DIAGNOSIS — D3502 Benign neoplasm of left adrenal gland: Secondary | ICD-10-CM | POA: Insufficient documentation

## 2017-11-16 DIAGNOSIS — D490 Neoplasm of unspecified behavior of digestive system: Secondary | ICD-10-CM | POA: Diagnosis not present

## 2017-11-16 DIAGNOSIS — D471 Chronic myeloproliferative disease: Secondary | ICD-10-CM | POA: Diagnosis not present

## 2017-11-16 DIAGNOSIS — K869 Disease of pancreas, unspecified: Secondary | ICD-10-CM | POA: Insufficient documentation

## 2017-11-16 DIAGNOSIS — D168 Benign neoplasm of pelvic bones, sacrum and coccyx: Principal | ICD-10-CM

## 2017-11-16 DIAGNOSIS — E1129 Type 2 diabetes mellitus with other diabetic kidney complication: Secondary | ICD-10-CM | POA: Diagnosis not present

## 2017-11-16 DIAGNOSIS — C772 Secondary and unspecified malignant neoplasm of intra-abdominal lymph nodes: Secondary | ICD-10-CM | POA: Insufficient documentation

## 2017-11-16 DIAGNOSIS — D3500 Benign neoplasm of unspecified adrenal gland: Secondary | ICD-10-CM | POA: Diagnosis not present

## 2017-11-16 DIAGNOSIS — J439 Emphysema, unspecified: Secondary | ICD-10-CM | POA: Diagnosis not present

## 2017-11-16 DIAGNOSIS — I4892 Unspecified atrial flutter: Secondary | ICD-10-CM | POA: Diagnosis not present

## 2017-11-16 DIAGNOSIS — R140 Abdominal distension (gaseous): Secondary | ICD-10-CM

## 2017-11-16 DIAGNOSIS — R935 Abnormal findings on diagnostic imaging of other abdominal regions, including retroperitoneum: Secondary | ICD-10-CM | POA: Diagnosis not present

## 2017-11-16 DIAGNOSIS — M898X8 Other specified disorders of bone, other site: Secondary | ICD-10-CM

## 2017-11-16 MED ORDER — GADOBENATE DIMEGLUMINE 529 MG/ML IV SOLN
20.0000 mL | Freq: Once | INTRAVENOUS | Status: AC | PRN
  Administered 2017-11-16: 19 mL via INTRAVENOUS

## 2017-11-24 ENCOUNTER — Ambulatory Visit: Payer: PPO | Admitting: Internal Medicine

## 2017-11-30 ENCOUNTER — Other Ambulatory Visit: Payer: Self-pay | Admitting: Oncology

## 2017-11-30 ENCOUNTER — Telehealth: Payer: Self-pay | Admitting: Oncology

## 2017-11-30 NOTE — Telephone Encounter (Signed)
I Called pt and got his voicemail and left message that there was a mass that was on mri and dr Janese Banks working on a plan to figure it out. I will try calling him in am first thing to talk to him rather than leaving a big message.

## 2017-12-01 NOTE — Telephone Encounter (Signed)
Called patient back this a.m. and got him on the telephone.  Explained to him about the abnormal MRI . Dr. Janese Banks is working on getting him an endoscopy ultrasound.  Called patient back later this morning to let him know available endoscopy ultrasound is February 7.  Patient agreeable to it and it can be done here in the hospital [facility].  Kristi the navigator will be calling him and giving him time and instructions.  I did tell patient that if he wants to go to [facility] we can get his ultrasound done sooner but patient would like to stay at [facility].

## 2017-12-02 ENCOUNTER — Other Ambulatory Visit: Payer: Self-pay

## 2017-12-02 ENCOUNTER — Telehealth: Payer: Self-pay

## 2017-12-02 NOTE — Telephone Encounter (Signed)
The pt has been scheduled for EUS at [facility] on 2/7.  Need to see if the pt will be seeing [facility] doc or have with EUS with Ardis Hughs on 12/29/17 at 21 am

## 2017-12-02 NOTE — Telephone Encounter (Signed)
-----   Message from Milus Banister, MD sent at 12/02/2017  8:04 AM EST ----- Dr. Janese Banks, Happy to help.  We'll contact him about EUS.  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  Thanks  DJ  ----- Message ----- From: Sindy Guadeloupe, MD Sent: 11/30/2017  11:59 AM To: Milus Banister, MD  Hello Dr. 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 Discussed at tumor board. Was not a sure shot resectable disease. Patient was asymptomatic. Plan was repeat scan in 6 months  He was noted to have some pancreatic fullness more suggestive of pancreatitis/ inflammation. No uptake on gallium PET. Underwent MRI abdomen which shows pancreatic mass. Can you please review his case and see if you can do a EUS to evaluate this mass? I am seeing him tomorrow to discuss MRI results that was ordered by PCP.  Thanks, Astrid Divine

## 2017-12-02 NOTE — Telephone Encounter (Signed)
Referral received from Dr. Janese Banks for EUS. EUS scheduled for 12/22/17 with Dr. Cephas Darby at [facility]. Went over instructions with [name] and copy mailed to home address along with my contact information for any future questions or needs. Denies anticoagulants and he was instructed to hold oral diabetic medication unless instructed otherwise by Endoscopy when he calls.  INSTRUCTIONS FOR ENDOSCOPIC ULTRASOUND -Your procedure has been scheduled for February 7th with Dr. Cephas Darby at [facility]. -The hospital may contact you to pre-register over the phone.  -To get your scheduled arrival time, please call the Endoscopy unit at  [phone] between 1-3 p.m. on:  February 6th   -ON THE DAY OF YOU PROCEDURE:   1. If you are scheduled for a morning procedure, nothing to drink after midnight  -If you are scheduled for an afternoon procedure, you may have clear liquids until 5 hours prior  to the procedure but no carbonated drinks or broth  2. NO FOOD THE DAY OF YOUR PROCEDURE  3. You may take your heart, seizure, blood pressure, Parkinson's or breathing medications at  6am with just enough water to get your pills down  4. Do not take any oral Diabetic medications the morning of your procedure.  5. If you are a diabetic and are using insulin, please notify your prescribing physician of this  procedure as your dose may need to be altered related to not being able to eat or drink.   5. Do not take vitamins, iron, or fish oil for 5 days before your procedure     -On the day of your procedure, come to the [facility] Admitting/Registration desk (First desk on the right) at the scheduled arrival time. You MUST have someone drive you home from your procedure. You must have a responsible adult with a valid driver's license who is on site throughout your entire procedure and who can stay with you for several hours after your procedure. You may not go home alone in a taxi, shuttle [place] or bus,  as the drivers will not be responsible for you.  --If you have any questions please call me at the above contact Oncology Nurse Navigator Documentation  Navigator Location: [facility] (12/02/17 1000)   )Navigator Encounter Type: Telephone (12/02/17 1000) Telephone: Lahoma Crocker Call (12/02/17 1000)                       Barriers/Navigation Needs: Coordination of Care (12/02/17 1000)   Interventions: Coordination of Care (12/02/17 1000)   Coordination of Care: EUS (12/02/17 1000)                  Time Spent with Patient: 30 (12/02/17 1000)

## 2017-12-05 ENCOUNTER — Telehealth: Payer: Self-pay

## 2017-12-05 NOTE — Telephone Encounter (Signed)
[name] notified of an opening for EUS sooner on 12/08/17. He stated he appreciated the offer but will not be able to come that day and will keep his appointment for 12/22/17. Oncology Nurse Navigator Documentation  Navigator Location: [facility] (12/05/17 1400)   )Navigator Encounter Type: Telephone (12/05/17 1400) Telephone: Outgoing Call (12/05/17 1400)                       Barriers/Navigation Needs: Coordination of Care (12/05/17 1400)   Interventions: Coordination of Care (12/05/17 1400)   Coordination of Care: EUS (12/05/17 1400)                  Time Spent with Patient: 15 (12/05/17 1400)

## 2017-12-06 NOTE — Telephone Encounter (Signed)
The pt has been scheduled with [facility].  He will keep that appt.

## 2017-12-11 DIAGNOSIS — J449 Chronic obstructive pulmonary disease, unspecified: Secondary | ICD-10-CM | POA: Diagnosis not present

## 2017-12-14 ENCOUNTER — Telehealth: Payer: Self-pay | Admitting: Oncology

## 2017-12-14 NOTE — Telephone Encounter (Signed)
Rschd Lab/MD, per MD on PAL. Rschd and conf updated appt with patient. Appt schd mailed. MF

## 2017-12-20 ENCOUNTER — Other Ambulatory Visit: Payer: Self-pay

## 2017-12-22 ENCOUNTER — Ambulatory Visit: Payer: PPO | Admitting: Anesthesiology

## 2017-12-22 ENCOUNTER — Encounter: Payer: Self-pay | Admitting: *Deleted

## 2017-12-22 ENCOUNTER — Ambulatory Visit
Admission: RE | Admit: 2017-12-22 | Discharge: 2017-12-22 | Disposition: A | Discharge status: Home / Self Care | Payer: PPO | Source: Ambulatory Visit | Attending: Gastroenterology | Admitting: Gastroenterology

## 2017-12-22 ENCOUNTER — Other Ambulatory Visit: Payer: Self-pay

## 2017-12-22 ENCOUNTER — Encounter
Admission: RE | Disposition: A | Discharge status: Home / Self Care | Payer: Self-pay | Source: Ambulatory Visit | Attending: Gastroenterology

## 2017-12-22 DIAGNOSIS — Z881 Allergy status to other antibiotic agents status: Secondary | ICD-10-CM | POA: Diagnosis not present

## 2017-12-22 DIAGNOSIS — C250 Malignant neoplasm of head of pancreas: Secondary | ICD-10-CM | POA: Diagnosis not present

## 2017-12-22 DIAGNOSIS — K8689 Other specified diseases of pancreas: Secondary | ICD-10-CM | POA: Diagnosis not present

## 2017-12-22 DIAGNOSIS — Z87891 Personal history of nicotine dependence: Secondary | ICD-10-CM | POA: Diagnosis not present

## 2017-12-22 DIAGNOSIS — K449 Diaphragmatic hernia without obstruction or gangrene: Secondary | ICD-10-CM | POA: Diagnosis not present

## 2017-12-22 DIAGNOSIS — I129 Hypertensive chronic kidney disease with stage 1 through stage 4 chronic kidney disease, or unspecified chronic kidney disease: Secondary | ICD-10-CM | POA: Diagnosis not present

## 2017-12-22 DIAGNOSIS — N183 Chronic kidney disease, stage 3 (moderate): Secondary | ICD-10-CM | POA: Diagnosis not present

## 2017-12-22 DIAGNOSIS — Z79899 Other long term (current) drug therapy: Secondary | ICD-10-CM | POA: Diagnosis not present

## 2017-12-22 DIAGNOSIS — E1122 Type 2 diabetes mellitus with diabetic chronic kidney disease: Secondary | ICD-10-CM | POA: Insufficient documentation

## 2017-12-22 DIAGNOSIS — Z7984 Long term (current) use of oral hypoglycemic drugs: Secondary | ICD-10-CM | POA: Diagnosis not present

## 2017-12-22 DIAGNOSIS — R933 Abnormal findings on diagnostic imaging of other parts of digestive tract: Secondary | ICD-10-CM | POA: Diagnosis present

## 2017-12-22 DIAGNOSIS — J439 Emphysema, unspecified: Secondary | ICD-10-CM | POA: Diagnosis not present

## 2017-12-22 DIAGNOSIS — I2510 Atherosclerotic heart disease of native coronary artery without angina pectoris: Secondary | ICD-10-CM | POA: Diagnosis not present

## 2017-12-22 DIAGNOSIS — E785 Hyperlipidemia, unspecified: Secondary | ICD-10-CM | POA: Diagnosis not present

## 2017-12-22 DIAGNOSIS — J449 Chronic obstructive pulmonary disease, unspecified: Secondary | ICD-10-CM | POA: Diagnosis not present

## 2017-12-22 HISTORY — PX: EUS: SHX5427

## 2017-12-22 SURGERY — ULTRASOUND, UPPER GI TRACT, ENDOSCOPIC
Anesthesia: General

## 2017-12-22 MED ORDER — PROPOFOL 500 MG/50ML IV EMUL
INTRAVENOUS | Status: DC | PRN
  Administered 2017-12-22: 180 ug/kg/min via INTRAVENOUS

## 2017-12-22 MED ORDER — EPHEDRINE SULFATE 50 MG/ML IJ SOLN
INTRAMUSCULAR | Status: DC | PRN
  Administered 2017-12-22: 5 mg via INTRAVENOUS

## 2017-12-22 MED ORDER — FENTANYL CITRATE (PF) 100 MCG/2ML IJ SOLN
INTRAMUSCULAR | Status: DC | PRN
  Administered 2017-12-22 (×2): 50 ug via INTRAVENOUS

## 2017-12-22 MED ORDER — PHENYLEPHRINE HCL 10 MG/ML IJ SOLN
INTRAMUSCULAR | Status: DC | PRN
  Administered 2017-12-22 (×2): 100 ug via INTRAVENOUS

## 2017-12-22 MED ORDER — IPRATROPIUM-ALBUTEROL 0.5-2.5 (3) MG/3ML IN SOLN
RESPIRATORY_TRACT | Status: AC
  Administered 2017-12-22: 3 mL
  Filled 2017-12-22: qty 3

## 2017-12-22 MED ORDER — PHENYLEPHRINE HCL 10 MG/ML IJ SOLN
INTRAVENOUS | Status: DC | PRN
  Administered 2017-12-22: 15 ug/min via INTRAVENOUS

## 2017-12-22 MED ORDER — PROPOFOL 10 MG/ML IV BOLUS
INTRAVENOUS | Status: DC | PRN
  Administered 2017-12-22: 100 mg via INTRAVENOUS

## 2017-12-22 MED ORDER — LIDOCAINE 2% (20 MG/ML) 5 ML SYRINGE
INTRAMUSCULAR | Status: DC | PRN
  Administered 2017-12-22: 40 mg via INTRAVENOUS

## 2017-12-22 MED ORDER — FENTANYL CITRATE (PF) 100 MCG/2ML IJ SOLN
INTRAMUSCULAR | Status: AC
  Filled 2017-12-22: qty 2

## 2017-12-22 MED ORDER — PROPOFOL 500 MG/50ML IV EMUL
INTRAVENOUS | Status: AC
  Filled 2017-12-22: qty 50

## 2017-12-22 MED ORDER — SODIUM CHLORIDE 0.9 % IV SOLN
INTRAVENOUS | Status: DC | PRN
  Administered 2017-12-22: 13:00:00 via INTRAVENOUS
  Administered 2017-12-22: 1000 mL

## 2017-12-22 NOTE — Transfer of Care (Signed)
Immediate Anesthesia Transfer of Care Note  Patient: [name]  Procedure(s) Performed: FULL UPPER ENDOSCOPIC ULTRASOUND (EUS) RADIAL (N/A )  Patient Location: PACU and Endoscopy Unit  Anesthesia Type:General  Level of Consciousness: awake and patient cooperative  Airway & Oxygen Therapy: Patient Spontanous Breathing and Patient connected to nasal cannula oxygen  Post-op Assessment: Report given to RN and Post -op Vital signs reviewed and stable  Post vital signs: Reviewed and stable  Last Vitals:  Vitals:   12/22/17 1234 12/22/17 1410  BP: 108/69   Pulse: (!) 118   Resp: (!) 22   Temp: (!) 36.3 C (!) 35.7 C  SpO2: 98%     Last Pain:  Vitals:   12/22/17 1234  TempSrc: Tympanic      Patients Stated Pain Goal: 10 (84/69/62 9528)  Complications: No apparent anesthesia complications

## 2017-12-22 NOTE — Op Note (Signed)
[facility] Gastroenterology Patient Name: [name] Procedure Date: 12/22/2017 12:56 PM MRN: [id number] Account #: [id number] Date of Birth: 02/07/1946 Admit Type: Outpatient Age: 72 Room: [facility] ENDO ROOM 3 Gender: Male Note Status: Finalized Procedure:            Upper EUS Indications:          Suspected mass in pancreas on MRI Patient Profile:      Refer to note in patient chart for documentation of                        history and physical. Providers:            Lenetta Quaker. Cephas Darby, MD Referring MD:         [name], MD (Referring MD) Medicines:            Propofol per Anesthesia Complications:        No immediate complications. Procedure:            Pre-Anesthesia Assessment:                       - Prior to the procedure, a History and Physical was                        performed, and patient medications and allergies were                        reviewed. The patient is competent. The risks and                        benefits of the procedure and the sedation options and                        risks were discussed with the patient. All questions                        were answered and informed consent was obtained.                        Patient identification and proposed procedure were                        verified by the physician in the pre-procedure area.                        Mental Status Examination: alert and oriented. Airway                        Examination: normal oropharyngeal airway and neck                        mobility. Respiratory Examination: clear to                        auscultation. CV Examination: regular rate and rhythm.                        ASA Grade Assessment: II - A patient with mild systemic  disease. After reviewing the risks and benefits, the                        patient was deemed in satisfactory condition to undergo                        the procedure. The anesthesia plan was to use  monitored                        anesthesia care (MAC). Immediately prior to                        administration of medications, the patient was                        re-assessed for adequacy to receive sedatives. The                        heart rate, respiratory rate, oxygen saturations, blood                        pressure, adequacy of pulmonary ventilation, and                        response to care were monitored throughout the                        procedure. The physical status of the patient was                        re-assessed after the procedure.                       After obtaining informed consent, the endoscope was                        passed under direct vision. Throughout the procedure,                        the patient's blood pressure, pulse, and oxygen                        saturations were monitored continuously. The EUS GI                        Linear Array [id number] was introduced through the mouth,                        and advanced to the duodenum for ultrasound examination                        from the esophagus, stomach and duodenum. Findings:      Endoscopic Finding :      Salmon-colored mucosa was present in the distal esophagus.      A medium-sized hiatal hernia was present.      The entire examined stomach was endoscopically normal.      The examined duodenum was endoscopically normal.      Endosonographic Finding :      A round ill defined possible mass lesion was identified in the  pancreatic head. The mass was hypoechoic. The mass measured 23 mm by 31       mm in maximal cross-sectional diameter. The endosonographic borders were       poorly-defined. The remainder of the pancreas was examined. The       endosonographic appearance of parenchyma and the upstream pancreatic       duct indicated duct dilation and parenchymal atrophy. Fine needle biopsy       was performed. Color Doppler imaging was utilized prior to needle       puncture  to confirm a lack of significant vascular structures within the       needle path. Four passes were made with the 25 gauge Medtronic Sharkcore       ultrasound biopsy needle using a transduodenal approach. Preliminary       cytologic examination and touch preps were performed. The cellularity of       the specimen was adequate. Final cytology results are pending.      Endosonographic imaging of the pancreas showed sonographic changes       indicative of moderate chronic pancreatitis in the entire pancreas. The       parenchyma had atrophy, hyperechoic strands, hyperechoic foci,       hyperechoic foci with shadowing and lobularity without honeycombing. The       pancreatic duct had duct dilation. The pancreatic duct measured up to 6       mm in diameter.      There was no sign of significant endosonographic abnormality in the       common bile duct and in the common hepatic duct. The maximum diameter of       the CHD was 4.4 mm and CBD was 2.1 mm.      Multiple lesions were found in the left lobe of the liver. The lesions       were hyperechoic. The largest lesion measured 2 mm by 1 mm in maximal       cross-sectional diameter.      No lymph nodes were visualized in the celiac region (level 20) and       peripancreatic region. Impression:           EGD Impression:                       - Salmon colored mucosa in the distal esophagus query                        possible Barretts. Due to respiratory instability the                        scope was withdrawn and biopsies were not obtained.                       - Medium-sized hiatal hernia.                       - Normal stomach.                       - Normal examined duodenum.                       EUS Impression:                       -  An ill defined hypoechoic lesion was identified in                        the pancreatic head. Tissue was obtained from this                        exam, and results are pending. In the setting of                         changes consistent with chronic pancreatitis query                        possible underlying pancreatitis verus possible mass                        lesion. If cytology confirms an underlying malignancy                        this would be staged T2 N0 Mx by endosonographic                        criteria. The staging applies if malignancy is                        confirmed. Fine needle biopsy performed.                       - Endosonographic imaging of the pancreas showed                        sonographic changes consistent with chronic                        pancreatitis.                       - There was no sign of significant pathology in the                        common bile duct and in the common hepatic duct.                       - Multiple 1-2 mm lesions were found in the left lobe                        of the liver. The lesions were hyperechoic. Would                        correlate with recent MRI. Given diminutive size they                        are not amaenable to EUS biopsy.                       - No lymph nodes were visualized in the celiac region                        (level 20) and peripancreatic region. Recommendation:       - Discharge patient to home.                       -  Await cytology results. If cytology is unrevealing                        would consider repeat EUS versus short interval MRI                        imaging.                       - Return to referring physician as previously scheduled.                       - The findings and recommendations were discussed with                        the patient and their family. Attending Participation:      I personally performed the entire procedure. Dr. Lenetta Quaker. Cephas Darby, MD Lenetta Quaker. Ryaan Vanwagoner, MD 12/22/2017 2:22:56 PM This report has been signed electronically. Number of Addenda: 0 Note Initiated On: 12/22/2017 12:56 PM Estimated Blood Loss: Estimated blood loss was minimal.       [facility]

## 2017-12-22 NOTE — H&P (Signed)
PRE-PROCEDURE HISTORY AND PHYSICAL   Durward Matranga presents for his scheduled Procedure(s): FULL UPPER ENDOSCOPIC ULTRASOUND (EUS) RADIAL.  The indication for the procedure(s) is pancreas head mass There have been no significant recent changes in the patient's medical status.  Past Medical History:  Diagnosis Date  . Atrial flutter ([place])    a. s/p ablation 09/2017.  . CKD (chronic kidney disease), stage III ([place])   . COPD (chronic obstructive pulmonary disease) ([place])   . Diabetes mellitus without complication ([place])   . Hyperlipidemia   . Hypertension   . Polycythemia     Past Surgical History:  Procedure Laterality Date  . A-FLUTTER ABLATION N/A 10/14/2017   Procedure: A-FLUTTER ABLATION;  Surgeon: Constance Haw, MD;  Location: [place] [facility];  Service: Cardiovascular;  Laterality: N/A;  . CARDIOVERSION N/A 09/08/2017   Procedure: CARDIOVERSION;  Surgeon: [name], MD;  Location: [facility] ORS;  Service: Cardiovascular;  Laterality: N/A;  . ELECTROPHYSIOLOGIC STUDY N/A 10/27/2016   Procedure: A-Flutter Ablation;  Surgeon: Deboraha Sprang, MD;  Location: [place] [facility];  Service: Cardiovascular;  Laterality: N/A;  . TEE WITH CARDIOVERSION  12/16    Allergies Allergies  Allergen Reactions  . Levaquin [Levofloxacin In D5w] Swelling and Rash    Medications acetaminophen, budesonide-formoterol, glucose blood, losartan, metFORMIN, metoprolol succinate, rosuvastatin, and vitamin B-12  Physical Examination  Body mass index is 31.79 kg/m. BP 108/69   Pulse (!) 118   Temp (!) 97.4 F (36.3 C) (Tympanic)   Resp (!) 22   Ht 5\' 7"  (1.702 m)   Wt 92.1 kg (203 lb)   SpO2 98%   BMI 31.79 kg/m  General:   Alert,  pleasant and cooperative in NAD Head:  Normocephalic and atraumatic. Neck:  Supple; no masses or thyromegaly. Lungs:  Clear throughout to auscultation.    Heart:  Regular rate and rhythm. Abdomen:  Soft, nontender and nondistended. Normal bowel  sounds, without guarding, and without rebound.   Neurologic:  Alert and  oriented x4;  grossly normal neurologically.  ASSESSMENT AND PLAN  Mr. Delaine has been evaluated and deemed appropriate to undergo the planned Procedure(s): FULL UPPER ENDOSCOPIC ULTRASOUND (EUS) .

## 2017-12-22 NOTE — Anesthesia Preprocedure Evaluation (Signed)
Anesthesia Evaluation  Patient identified by MRN, date of birth, ID band Patient awake    Reviewed: Allergy & Precautions, H&P , NPO status , Patient's Chart, lab work & pertinent test results  History of Anesthesia Complications Negative for: history of anesthetic complications  Airway Mallampati: III  TM Distance: <3 FB Neck ROM: limited    Dental  (+) Chipped, Poor Dentition, Missing   Pulmonary neg shortness of breath, COPD, former smoker,           Cardiovascular Exercise Tolerance: Good hypertension, (-) angina+ CAD  (-) Past MI and (-) DOE      Neuro/Psych negative neurological ROS  negative psych ROS   GI/Hepatic negative GI ROS, Neg liver ROS,   Endo/Other  diabetes, Type 2  Renal/GU Renal disease  negative genitourinary   Musculoskeletal   Abdominal   Peds  Hematology negative hematology ROS (+)   Anesthesia Other Findings Past Medical History: No date: Atrial flutter ([place])     Comment:  a. s/p ablation 09/2017. No date: CKD (chronic kidney disease), stage III ([facility]) No date: COPD (chronic obstructive pulmonary disease) ([facility]) No date: Diabetes mellitus without complication ([facility]) No date: Hyperlipidemia No date: Hypertension No date: Polycythemia  Past Surgical History: 10/14/2017: A-FLUTTER ABLATION; N/A     Comment:  Procedure: A-FLUTTER ABLATION;  Surgeon: Constance Haw, MD;  Location: [place] [facility];  Service:               Cardiovascular;  Laterality: N/A; 09/08/2017: CARDIOVERSION; N/A     Comment:  Procedure: CARDIOVERSION;  Surgeon: [name],               MD;  Location: [facility] OR[facility];  Service: Cardiovascular;                Laterality: N/A; 10/27/2016: ELECTROPHYSIOLOGIC STUDY; N/A     Comment:  Procedure: A-Flutter Ablation;  Surgeon: Deboraha Sprang,              MD;  Location: [place] [facility];  Service:               Cardiovascular;  Laterality:  N/A; 12/16: TEE WITH CARDIOVERSION  BMI    Body Mass Index:  31.79 kg/m      Reproductive/Obstetrics negative OB ROS                             Anesthesia Physical Anesthesia Plan  ASA: III  Anesthesia Plan: General   Post-op Pain Management:    Induction: Intravenous  PONV Risk Score and Plan: Propofol infusion and TIVA  Airway Management Planned: Natural Airway and Nasal Cannula  Additional Equipment:   Intra-op Plan:   Post-operative Plan:   Informed Consent: I have reviewed the patients History and Physical, chart, labs and discussed the procedure including the risks, benefits and alternatives for the proposed anesthesia with the patient or authorized representative who has indicated his/her understanding and acceptance.   Dental Advisory Given  Plan Discussed with: Anesthesiologist, CRNA and Surgeon  Anesthesia Plan Comments: (Patient consented for risks of anesthesia including but not limited to:  - adverse reactions to medications - risk of intubation if required - damage to teeth, lips or other oral mucosa - sore throat or hoarseness - Damage to heart, brain, lungs or loss of life  Patient voiced understanding.)  Anesthesia Quick Evaluation  

## 2017-12-22 NOTE — Anesthesia Post-op Follow-up Note (Signed)
Anesthesia QCDR form completed.        

## 2017-12-23 NOTE — Anesthesia Postprocedure Evaluation (Signed)
Anesthesia Post Note  Patient: [name]  Procedure(s) Performed: FULL UPPER ENDOSCOPIC ULTRASOUND (EUS) RADIAL (N/A )  Patient location during evaluation: Endoscopy Anesthesia Type: General Level of consciousness: awake and alert Pain management: pain level controlled Vital Signs Assessment: post-procedure vital signs reviewed and stable Respiratory status: spontaneous breathing, nonlabored ventilation, respiratory function stable and patient connected to nasal cannula oxygen Cardiovascular status: blood pressure returned to baseline and stable Postop Assessment: no apparent nausea or vomiting Anesthetic complications: no     Last Vitals:  Vitals:   12/22/17 1420 12/22/17 1430  BP: 105/75 106/75  Pulse: (!) 111 (!) 111  Resp: (!) 22 11  Temp:    SpO2: 100% 100%    Last Pain:  Vitals:   12/22/17 1410  TempSrc: Tympanic                 Precious Haws P[place]it[place]

## 2017-12-26 DIAGNOSIS — K869 Disease of pancreas, unspecified: Secondary | ICD-10-CM | POA: Diagnosis not present

## 2017-12-29 LAB — CYTOLOGY - NON PAP

## 2017-12-30 ENCOUNTER — Telehealth: Payer: Self-pay | Admitting: Oncology

## 2017-12-30 NOTE — Telephone Encounter (Signed)
Appt schd for 01/03/18 as requested. Appt time is 11:30. Called patient to confirm appt. L/M on V/M

## 2018-01-03 ENCOUNTER — Inpatient Hospital Stay: Payer: PPO | Admitting: Oncology

## 2018-01-06 ENCOUNTER — Inpatient Hospital Stay: Payer: PPO

## 2018-01-06 ENCOUNTER — Inpatient Hospital Stay: Payer: PPO | Attending: Oncology | Admitting: Oncology

## 2018-01-06 ENCOUNTER — Encounter: Payer: Self-pay | Admitting: Oncology

## 2018-01-06 ENCOUNTER — Other Ambulatory Visit: Payer: Self-pay

## 2018-01-06 VITALS — BP 110/72 | HR 120 | Temp 97.8°F | Resp 16 | Ht 67.0 in | Wt 205.3 lb

## 2018-01-06 DIAGNOSIS — I129 Hypertensive chronic kidney disease with stage 1 through stage 4 chronic kidney disease, or unspecified chronic kidney disease: Secondary | ICD-10-CM | POA: Insufficient documentation

## 2018-01-06 DIAGNOSIS — D696 Thrombocytopenia, unspecified: Secondary | ICD-10-CM | POA: Insufficient documentation

## 2018-01-06 DIAGNOSIS — N183 Chronic kidney disease, stage 3 (moderate): Secondary | ICD-10-CM | POA: Diagnosis not present

## 2018-01-06 DIAGNOSIS — D3A8 Other benign neuroendocrine tumors: Secondary | ICD-10-CM

## 2018-01-06 DIAGNOSIS — Z7984 Long term (current) use of oral hypoglycemic drugs: Secondary | ICD-10-CM

## 2018-01-06 DIAGNOSIS — Z79899 Other long term (current) drug therapy: Secondary | ICD-10-CM | POA: Diagnosis not present

## 2018-01-06 DIAGNOSIS — C7A8 Other malignant neuroendocrine tumors: Secondary | ICD-10-CM | POA: Diagnosis not present

## 2018-01-06 DIAGNOSIS — Z87891 Personal history of nicotine dependence: Secondary | ICD-10-CM | POA: Insufficient documentation

## 2018-01-06 DIAGNOSIS — E1122 Type 2 diabetes mellitus with diabetic chronic kidney disease: Secondary | ICD-10-CM | POA: Diagnosis not present

## 2018-01-06 DIAGNOSIS — J449 Chronic obstructive pulmonary disease, unspecified: Secondary | ICD-10-CM | POA: Diagnosis not present

## 2018-01-06 DIAGNOSIS — C784 Secondary malignant neoplasm of small intestine: Secondary | ICD-10-CM | POA: Diagnosis not present

## 2018-01-06 DIAGNOSIS — E785 Hyperlipidemia, unspecified: Secondary | ICD-10-CM | POA: Diagnosis not present

## 2018-01-06 LAB — CBC WITH DIFFERENTIAL/PLATELET
BASOS ABS: 0.1 10*3/uL (ref 0–0.1)
BASOS PCT: 1 %
Eosinophils Absolute: 0.3 10*3/uL (ref 0–0.7)
Eosinophils Relative: 3 %
HEMATOCRIT: 48.7 % (ref 40.0–52.0)
HEMOGLOBIN: 16.3 g/dL (ref 13.0–18.0)
LYMPHS PCT: 15 %
Lymphs Abs: 1.5 10*3/uL (ref 1.0–3.6)
MCH: 32.5 pg (ref 26.0–34.0)
MCHC: 33.4 g/dL (ref 32.0–36.0)
MCV: 97.3 fL (ref 80.0–100.0)
MONO ABS: 0.7 10*3/uL (ref 0.2–1.0)
Monocytes Relative: 8 %
NEUTROS ABS: 7.2 10*3/uL — AB (ref 1.4–6.5)
NEUTROS PCT: 73 %
Platelets: 152 10*3/uL (ref 150–440)
RBC: 5.01 MIL/uL (ref 4.40–5.90)
RDW: 11.9 % (ref 11.5–14.5)
WBC: 9.8 10*3/uL (ref 3.8–10.6)

## 2018-01-06 LAB — COMPREHENSIVE METABOLIC PANEL
ALBUMIN: 4.1 g/dL (ref 3.5–5.0)
ALK PHOS: 89 U/L (ref 38–126)
ALT: 22 U/L (ref 17–63)
AST: 22 U/L (ref 15–41)
Anion gap: 7 (ref 5–15)
BILIRUBIN TOTAL: 0.6 mg/dL (ref 0.3–1.2)
BUN: 43 mg/dL — AB (ref 6–20)
CALCIUM: 9.6 mg/dL (ref 8.9–10.3)
CO2: 23 mmol/L (ref 22–32)
CREATININE: 2.07 mg/dL — AB (ref 0.61–1.24)
Chloride: 105 mmol/L (ref 101–111)
GFR calc Af Amer: 35 mL/min — ABNORMAL LOW (ref >60)
GFR, EST NON AFRICAN AMERICAN: 30 mL/min — AB (ref >60)
GLUCOSE: 167 mg/dL — AB (ref 65–99)
POTASSIUM: 4.9 mmol/L (ref 3.5–5.1)
Sodium: 135 mmol/L (ref 135–145)
TOTAL PROTEIN: 7 g/dL (ref 6.5–8.1)

## 2018-01-06 NOTE — Progress Notes (Signed)
Patient here for for results. He reports no changes since last Apt.

## 2018-01-06 NOTE — Progress Notes (Signed)
Hematology/Oncology Consult note [facility]  [phone] [phone]  Patient Care Team: [name], MD as PCP - General (Cardiology) [name], MD as Consulting Physician (Cardiology) Clent Jacks, RN as Registered Nurse   Name of the patient: [name]  [id number]  September 26, 1946   Date of visit: 01/06/18  Diagnosis- metastatic small bowel carcinoid  Chief complaint/ Reason for visit- discuss management options for carcinoid  Heme/Onc history: Patient is a 72 year old male who was seen by me for immature cells noted inperipheral blood.Results of blood work from 12/03/2016 was as follows: CBC showed white count of 7.6, H&H of 16.2/46.9 and a platelet count of 137. Differential at that time was normal and no immature cells were noted. Review of peripheral blood smear showed mild thrombocytopenia. Blasts are not identified. Peripheral flow cytometry did not reveal any significant diagnostic immunophenotypic abnormality. EPO level was normal at 8.1.JAK2 testing was not completed because of insufficient sample. BCR abl testing done on 10/27/2016 did not reveal any evidence of 9:22 translocation suggestive of CML.  Patient was asked to f/u with PCP.   He  underwent CT abdomen on 9/18  for decreased kidney function and hematuria. CT showed:Abnormal partially calcified soft tissue density in the ileocolic mesentery could represent a carcinoid metastasis. Differential considerations include the sequelae of prior mesenteric adenitis/panniculitis. Given traversing and superior mesenteric artery branches on the prior exam from 2012, aneurysm is possible but felt less likely. Consider further evaluation with octreotide scintigraphy. 3. L5 vague sclerotic lesion is not readily apparent on the prior exam. This may represent a bone island. If the patient is eventually diagnosed with a malignancy, osseous metastasis would be a  concern. 4. Possible soft tissue fullness within the pancreatic body and tail with equivocal peripancreatic edema. Correlate with signs/symptoms of pancreatitis.  Dotatate scan in November 2018 showed:IMPRESSION: 1. 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 2. Single nodal metastasis at the root of the mesenteric vessels. 3. Focal activity within the the small bowel of the lower abdomen midline without CT correlation. This could represent a primary neuroendocrine tumor neoplasm. Consider CT enterography for further evaluation. 4. No abnormal radiotracer accumulation within the pancreas.   MRI abdomen and pelvis with and without contrast in January 2019 showed:IMPRESSION: 1.8 x 2.0 cm enhancing mass in the right mid abdominal mesentery, hypermetabolic on dotatate PET, consistent with well-differentiated neuroendocrine tumor metastasis.  Additional 9 mm nodal metastasis in the central abdominal mesentery.  2.2 x 1.7 cm mass in the pancreatic head, without hypermetabolism on prior dotatate PET. Associated segmental atrophy the pancreatic body. Dilatation of the main pancreatic duct in the pancreatic tail. These findings are worrisome for pancreatic neoplasm, but are less suggestive of pancreatic adenocarcinoma or well-differentiated neuroendocrine tumor. EUS is suggested. 2.3 cm left adrenal adenoma, benign.  This was followed by EUS and FNA of the pancreatic head mass which showed:DIAGNOSIS:  A. PANCREATIC HEAD MASS; ENDOSCOPIC ULTRASOUND GUIDED FNA:  - POSITIVE FOR MALIGNANCY.  - NON-DUCTAL NEOPLASM PRESENT, SEE COMMENT.  Based on cytologic features differential diagnosis includes neuroendocrine tumor and asthenic cell carcinoma.  Case sent to [facility] for second opinion who also favored the diagnosis of neuroendocrine tumor noting that acinar cell carcinoma cannot be excluded  Interval  history- he is doing well. Denies any abdominal pain, cramping, diarrhea. Denies any chest pain or heart palpitations.  ECOG PS- 1 Pain scale- 0   Review of systems- Review  of Systems  Constitutional: Negative for chills, fever, malaise/fatigue and weight loss.  HENT: Negative for congestion, ear discharge and nosebleeds.   Eyes: Negative for blurred vision.  Respiratory: Negative for cough, hemoptysis, sputum production, shortness of breath and wheezing.   Cardiovascular: Negative for chest pain, palpitations, orthopnea and claudication.  Gastrointestinal: Negative for abdominal pain, blood in stool, constipation, diarrhea, heartburn, melena, nausea and vomiting.  Genitourinary: Negative for dysuria, flank pain, frequency, hematuria and urgency.  Musculoskeletal: Negative for back pain, joint pain and myalgias.  Skin: Negative for rash.  Neurological: Negative for dizziness, tingling, focal weakness, seizures, weakness and headaches.  Endo/Heme/Allergies: Does not bruise/bleed easily.  Psychiatric/Behavioral: Negative for depression and suicidal ideas. The patient does not have insomnia.       Allergies  Allergen Reactions  . Levaquin [Levofloxacin In D5w] Swelling and Rash     Past Medical History:  Diagnosis Date  . Atrial flutter ([place])    a. s/p ablation 09/2017.  . CKD (chronic kidney disease), stage III ([place])   . COPD (chronic obstructive pulmonary disease) ([place])   . Diabetes mellitus without complication ([place])   . Hyperlipidemia   . Hypertension   . Polycythemia      Past Surgical History:  Procedure Laterality Date  . A-FLUTTER ABLATION N/A 10/14/2017   Procedure: A-FLUTTER ABLATION;  Surgeon: Constance Haw, MD;  Location: [facility];  Service: Cardiovascular;  Laterality: N/A;  . CARDIOVERSION N/A 09/08/2017   Procedure: CARDIOVERSION;  Surgeon: [name], MD;  Location: [facility];  Service: Cardiovascular;  Laterality: N/A;  .  ELECTROPHYSIOLOGIC STUDY N/A 10/27/2016   Procedure: A-Flutter Ablation;  Surgeon: Deboraha Sprang, MD;  Location: [facility];  Service: Cardiovascular;  Laterality: N/A;  . EUS N/A 12/22/2017   Procedure: FULL UPPER ENDOSCOPIC ULTRASOUND (EUS) RADIAL;  Surgeon: Reita Cliche, MD;  Location: [facility];  Service: Gastroenterology;  Laterality: N/A;  . TEE WITH CARDIOVERSION  12/16    Social History   Socioeconomic History  . Marital status: Widowed    Spouse name: Not on file  . Number of children: Not on file  . Years of education: Not on file  . Highest education level: Not on file  Social Needs  . Financial resource strain: Not on file  . Food insecurity - worry: Not on file  . Food insecurity - inability: Not on file  . Transportation needs - medical: Not on file  . Transportation needs - non-medical: Not on file  Occupational History  . Not on file  Tobacco Use  . Smoking status: Former Smoker    Packs/day: 1.50    Years: 45.00    Pack years: 67.50    Types: Cigarettes    Last attempt to quit: 11/27/2012    Years since quitting: 5.1  . Smokeless tobacco: Never Used  Substance and Sexual Activity  . Alcohol use: Yes    Alcohol/week: 1.2 oz    Types: 2 Cans of beer per week    Comment: beer every once a while once a month  . Drug use: No  . Sexual activity: Not on file  Other Topics Concern  . Not on file  Social History Narrative  . Not on file    Family History  Problem Relation Age of Onset  . Hypertension Father      Current Outpatient Medications:  .  acetaminophen (TYLENOL) 500 MG tablet, Take 500-1,000 mg by mouth every 6 (six) hours as needed (  for pain.)., Disp: , Rfl:  .  budesonide-formoterol (SYMBICORT) 80-4.5 MCG/ACT inhaler, Inhale 1 puff into the lungs 2 times daily at 12 noon and 4 pm., Disp: 1 Inhaler, Rfl: 0 .  glucose blood (FREESTYLE LITE) test strip, Check blood sugar 2x a day as directed, Disp: , Rfl:  .  losartan (COZAAR) 25  MG tablet, Take 25 mg by mouth daily., Disp: , Rfl:  .  metFORMIN (GLUCOPHAGE) 1000 MG tablet, Take 1 tablet (1,000 mg total) by mouth 2 (two) times daily with a meal., Disp: 180 tablet, Rfl: 0 .  metoprolol succinate (TOPROL-XL) 50 MG 24 hr tablet, Take 50 mg by mouth every evening. Take with or immediately following a meal. , Disp: , Rfl:  .  rosuvastatin (CRESTOR) 40 MG tablet, TAKE 1 TABLET BY MOUTH ONCE DAILY (Patient taking differently: TAKE 1 TABLET BY MOUTH ONCE DAILY AT BEDTIME.), Disp: 90 tablet, Rfl: 2 .  vitamin B-12 (CYANOCOBALAMIN) 500 MCG tablet, Take 500 mcg by mouth daily., Disp: , Rfl:   Physical exam:  Vitals:   01/06/18 0823 01/06/18 0827  BP:  110/72  Pulse:  (!) 120  Resp: 16   Temp:  97.8 F (36.6 C)  TempSrc:  Tympanic  Weight: 205 lb 4.8 oz (93.1 kg)   Height: 5\' 7"  (1.702 m)    Physical Exam  Constitutional: He is oriented to person, place, and time and well-developed, well-nourished, and in no distress.  HENT:  Head: Normocephalic and atraumatic.  Eyes: EOM are normal. Pupils are equal, round, and reactive to light.  Neck: Normal range of motion.  Cardiovascular: Normal rate, regular rhythm and normal heart sounds.  Pulmonary/Chest: Effort normal and breath sounds normal.  Abdominal: Soft. Bowel sounds are normal.  Neurological: He is alert and oriented to person, place, and time.  Skin: Skin is warm and dry.     CMP Latest Ref Rng & Units 10/11/2017  Glucose 65 - 99 mg/dL 223(H)  BUN 8 - 27 mg/dL 33(H)  Creatinine 0.76 - 1.27 mg/dL 1.67(H)  Sodium 134 - 144 mmol/L 140  Potassium 3.5 - 5.2 mmol/L 4.8  Chloride 96 - 106 mmol/L 106  CO2 20 - 29 mmol/L 22  Calcium 8.6 - 10.2 mg/dL 9.0  Total Protein 6.5 - 8.1 g/dL -  Total Bilirubin 0.3 - 1.2 mg/dL -  Alkaline Phos 38 - 126 U/L -  AST 15 - 41 U/L -  ALT 17 - 63 U/L -   CBC Latest Ref Rng & Units 10/11/2017  WBC 3.4 - 10.8 x10E3/uL 7.9  Hemoglobin 13.0 - 17.7 g/dL 15.6  Hematocrit 37.5 - 51.0 %  47.0  Platelets 150 - 379 x10E3/uL 131(L)     Assessment and plan- Patient is a 72 y.o. 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  Patient has a 2 cm pancreatic head mass along with a 2.7 cm mass in the central mesentery along with a 10 mm adjacent lymph node.  The mesenteric mass as well as a lymph node had intense uptake on the gallium PET scan consistent with a neuroendocrine tumor.  Patient also underwent FNA of the pancreatic mass which was more consistent with neuroendocrine tumor.  Patient does not have any evidence of liver metastases at this time he is also clinically asymptomatic  and does not report any abdominal cramping or diarrhea.  I again discussed the results of the Claritin a trial which showed significant improvement in progression free survival and gastroentero pancreatic neuroendocrine tumors with the use of lanreotide versus placebo but no difference in overall survival.  However this benefit was mainly seen in patients with high-volume disease and per NCCN guidelines observation is a reasonable option to consider in patients with low volume disease such as his.  In general well-differentiated neuroendocrine tumors are slow growing with good long-term prognosis.  Patient is currently asymptomatic from his neuroendocrine tumor and lanreotide mainly helps in symptomatic patients although it does help to a lesser extent in decreasing tumor growth.  It may be associated with long-term complications such as fatty liver, glucose intolerance or increased risk of gallstones although acutely its well-tolerated other than mild abdominal cramping and possible diarrhea.  At this time I would recommend observation for his neuroendocrine tumor and repeat CT chest abdomen and pelvis  without contrast given that he has a history of chronic kidney disease.  If he has evidence of clinical progression I will consider starting lanreotide at that time  I will check CBC, CMP and chromogranin A levels today and repeat the same in 3 months time   Visit Diagnosis 1. Neuroendocrine tumor of pancreas      Dr. Randa Evens, MD, MPH [facility] Pager- [phone] 01/06/2018 10:53 AM

## 2018-01-09 LAB — CHROMOGRANIN A: Chromogranin A: 4 nmol/L (ref 0–5)

## 2018-01-10 DIAGNOSIS — E785 Hyperlipidemia, unspecified: Secondary | ICD-10-CM | POA: Diagnosis not present

## 2018-01-10 DIAGNOSIS — E781 Pure hyperglyceridemia: Secondary | ICD-10-CM | POA: Diagnosis not present

## 2018-01-10 DIAGNOSIS — E1122 Type 2 diabetes mellitus with diabetic chronic kidney disease: Secondary | ICD-10-CM | POA: Diagnosis not present

## 2018-01-10 DIAGNOSIS — N183 Chronic kidney disease, stage 3 (moderate): Secondary | ICD-10-CM | POA: Diagnosis not present

## 2018-01-11 DIAGNOSIS — J449 Chronic obstructive pulmonary disease, unspecified: Secondary | ICD-10-CM | POA: Diagnosis not present

## 2018-01-17 DIAGNOSIS — E279 Disorder of adrenal gland, unspecified: Secondary | ICD-10-CM | POA: Diagnosis not present

## 2018-01-17 DIAGNOSIS — E781 Pure hyperglyceridemia: Secondary | ICD-10-CM | POA: Diagnosis not present

## 2018-01-17 DIAGNOSIS — E538 Deficiency of other specified B group vitamins: Secondary | ICD-10-CM | POA: Diagnosis not present

## 2018-01-17 DIAGNOSIS — E1165 Type 2 diabetes mellitus with hyperglycemia: Secondary | ICD-10-CM | POA: Diagnosis not present

## 2018-01-17 DIAGNOSIS — E785 Hyperlipidemia, unspecified: Secondary | ICD-10-CM | POA: Diagnosis not present

## 2018-01-18 DIAGNOSIS — E669 Obesity, unspecified: Secondary | ICD-10-CM | POA: Diagnosis not present

## 2018-01-18 DIAGNOSIS — E785 Hyperlipidemia, unspecified: Secondary | ICD-10-CM | POA: Diagnosis not present

## 2018-01-18 DIAGNOSIS — I4892 Unspecified atrial flutter: Secondary | ICD-10-CM | POA: Diagnosis not present

## 2018-01-18 DIAGNOSIS — E1129 Type 2 diabetes mellitus with other diabetic kidney complication: Secondary | ICD-10-CM | POA: Diagnosis not present

## 2018-02-03 NOTE — Progress Notes (Signed)
Cardiology Office Note  Date:  02/07/2018   ID:  Gilverto, Dileonardo 1946/06/27, MRN [id number]  PCP:  [name], MD   Chief Complaint  Patient presents with  . other    3 month follow up. Meds. reviewed by the pt. verbally. "doing well."  Pt. has a cold.    HPI:  Mr. Martine is a 72 year old gentleman with   hypertension,  diabetes,  COPD,  long smoking history, polycythemia,   tachycardia.  Cardiology evaluated the patient 10/29/2014.  EKG showed atrial flutter with heart rate 140s up to 150.  long smoking history for 45 years.  started on beta blockers, diltiazem, digoxin, eventually started on amiodarone infusion with anticoagulation, eliquis. He had TEE and cardioversion on 10/30/2014. Normal sinus rhythm was successfully Cardioversion February 2016 Flutter April 2018 converted on his own within 1 week He presents for follow-up of his atrial flutter Who catheter ablation for his flutter October 2017 Flutter ablation 10/14/2017 Successful radiofrequency ablation of atrial flutter along the cavotricuspid isthmus with complete bidirectional isthmus block achieved.  Age-18, DM-1 HTN-1  CHADSVASc 3 Presents today for follow-up of his atrial flutter  Today rate 130 bpm, atrial flutter Bronchitis this week,  COPD exacerbation Other than some cough with green phlegm reports he is asymptomatic Does not know when he went into atrial flutter This is consistent with previous office visit when he was in atrial flutter rate 130 and was asymptomatic  denies any orthostasis, dizziness, shortness of breath on exertion  Seen by myself 10/11/2017, was in atrial flutter Electrophysiology study and radiofrequency catheter ablation on 10/14/17 by Dr Curt Bears. Seen by Dr. Caryl Comes 11/01/2017:  NSR eliquis and cardizem stopped Now back in atrial flutter  EKG personally reviewed by myself on todays visit Shows atrial flutter rate 132 bpm left axis deviation  Other past medical  history reviewed Back in atrial flutter 02/2017 In setting of upper respiratory tract infection  rate 122 bpm when he was seen by pulmonary one month earlier Having no sx Restarted  digoxin one a day , eliquis 5 mg twice a day, diltiazem 180 mg once a day On April 26 had converted back to normal sinus rhythm  clinic September 06, 2017 noted to have atrial flutter Cardioversion 09/08/17 for atrial flutter  Heart rate 120 bpm when seen by oncology November 21 Presenting in clinic today in atrial flutter rate 120 bpm  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 Eddie Dibbles Compliant with his inhalers   total cholesterol 167, LDL 100, hemoglobin A1c 7.7  Previous cardioversion on 01/07/2015 which successfully restored normal sinus rhythm  TEE report showed ejection fraction 45-50%, right and left atrium mildly enlarged, RV mildly dilated with mildly reduced systolic function, small amount of plaque in the aorta  CT scan of the chest 10/29/2014 in the hospital showed bilateral predominantly lower lobe pulmonary opacity most consistent with pneumonia   PMH:   has a past medical history of Atrial flutter ([place]), CKD (chronic kidney disease), stage III ([place]), COPD (chronic obstructive pulmonary disease) ([place]), Diabetes mellitus without complication ([place]), Hyperlipidemia, Hypertension, and Polycythemia.  PSH:    Past Surgical History:  Procedure Laterality Date  . A-FLUTTER ABLATION N/A 10/14/2017   Procedure: A-FLUTTER ABLATION;  Surgeon: Constance Haw, MD;  Location: [facility];  Service: Cardiovascular;  Laterality: N/A;  . CARDIOVERSION N/A 09/08/2017  Procedure: CARDIOVERSION;  Surgeon: [name], MD;  Location: [facility];  Service: Cardiovascular;  Laterality: N/A;  . ELECTROPHYSIOLOGIC STUDY N/A 10/27/2016   Procedure:  A-Flutter Ablation;  Surgeon: Deboraha Sprang, MD;  Location: [facility];  Service: Cardiovascular;  Laterality: N/A;  . EUS N/A 12/22/2017   Procedure: FULL UPPER ENDOSCOPIC ULTRASOUND (EUS) RADIAL;  Surgeon: Reita Cliche, MD;  Location: [facility];  Service: Gastroenterology;  Laterality: N/A;  . TEE WITH CARDIOVERSION  12/16    Current Outpatient Medications  Medication Sig Dispense Refill  . acetaminophen (TYLENOL) 500 MG tablet Take 500-1,000 mg by mouth every 6 (six) hours as needed (for pain.).    Marland Kitchen budesonide-formoterol (SYMBICORT) 80-4.5 MCG/ACT inhaler Inhale 1 puff into the lungs 2 times daily at 12 noon and 4 pm. 1 Inhaler 0  . glucose blood (FREESTYLE LITE) test strip Check blood sugar 2x a day as directed    . losartan (COZAAR) 25 MG tablet Take 25 mg by mouth daily.    . metFORMIN (GLUCOPHAGE) 1000 MG tablet Take 1 tablet (1,000 mg total) by mouth 2 (two) times daily with a meal. 180 tablet 0  . metoprolol succinate (TOPROL-XL) 50 MG 24 hr tablet Take 50 mg by mouth every evening. Take with or immediately following a meal.     . rosuvastatin (CRESTOR) 40 MG tablet TAKE 1 TABLET BY MOUTH ONCE DAILY (Patient taking differently: TAKE 1 TABLET BY MOUTH ONCE DAILY AT BEDTIME.) 90 tablet 2  . vitamin B-12 (CYANOCOBALAMIN) 500 MCG tablet Take 500 mcg by mouth daily.     No current facility-administered medications for this visit.      Allergies:   Levaquin [levofloxacin in d5w]   Social History:  The patient  reports that he quit smoking about 5 years ago. His smoking use included cigarettes. He has a 67.50 pack-year smoking history. He has never used smokeless tobacco. He reports that he drinks about 1.2 oz of alcohol per week. He reports that he does not use drugs.   Family History:   family history includes Hypertension in his father.    Review of Systems: Review of Systems  Constitutional: Negative.   Respiratory: Positive for cough.   Cardiovascular:  Negative.   Gastrointestinal: Negative.   Musculoskeletal: Negative.   Neurological: Negative.   Psychiatric/Behavioral: Negative.   All other systems reviewed and are negative.    PHYSICAL EXAM: VS:  BP 100/60 (BP Location: Left Arm, Patient Position: Sitting, Cuff Size: Normal)   Pulse (!) 132   Ht 5\' 7"  (1.702 m)   Wt 197 lb 4 oz (89.5 kg)   BMI 30.89 kg/m  , BMI Body mass index is 30.89 kg/m. Constitutional:  oriented to person, place, and time. No distress. Cough HENT:  Head: Normocephalic and atraumatic.  Eyes:  no discharge. No scleral icterus.  Neck: Normal range of motion. Neck supple. No JVD present.  Cardiovascular: Tachycardia , regular rhythm ,  intact distal pulses. Exam reveals no gallop and no friction rub. No edema No murmur heard. Pulmonary/Chest: Effort normal and breath sounds normal. No stridor. No respiratory distress.  no wheezes.  no rales.  no tenderness.  Abdominal: Soft.  no distension.  no tenderness.  Musculoskeletal: Normal range of motion.  no  tenderness or deformity.  Neurological:  normal muscle tone. Coordination normal. No atrophy Skin: Skin is warm and dry. No rash noted. not diaphoretic.  Psychiatric:  normal mood and affect. behavior is normal. Thought content normal.  Recent Labs: 01/06/2018: ALT 22; BUN 43; Creatinine, Ser 2.07; Hemoglobin 16.3; Platelets 152; Potassium 4.9; Sodium 135    Lipid Panel No results found for: CHOL, HDL, LDLCALC, TRIG    Wt Readings from Last 3 Encounters:  02/07/18 197 lb 4 oz (89.5 kg)  01/06/18 205 lb 4.8 oz (93.1 kg)  12/22/17 203 lb (92.1 kg)       ASSESSMENT AND PLAN:   Essential hypertension -  For atrial flutter as detailed above we will restart his Cardizem continue metoprolol He will monitor heart rate and blood pressure at home  Coronary artery disease involving native coronary artery of native heart with angina pectoris ([place]) - Plan: EKG 12-Lead, CANCELED: EKG 12-Lead Currently  with no symptoms of angina. No further workup at this time. Continue current medication regimen.  Stable  Typical atrial flutter ([facility]) - Plan: EKG 12-Lead Case discussed with Dr. Caryl Comes Will work on management of his COPD exacerbation, active bronchitis Doxycycline started for 10 days Restart Cardizem, continue metoprolol, restart Eliquis 5 twice daily We will see him back in several days time Recommended he call us with heart rate in the next several days Suspect he will need TEE and cardioversion He is hoping to avoid repeat ablation  Centrilobular emphysema ([place]) Stopped smoking many years ago, maintained on his inhalers Active bronchitis on today's visit likely triggering his flutter  Uncontrolled type 2 diabetes mellitus with other circulatory complication, without long-term current use of insulin ([place])  recommend he continue to follow a low carbohydrate diet   Total encounter time more than 60 minutes  Greater than 50% was spent in counseling and coordination of care with the patient  Disposition:   F/U  1 week   Orders Placed This Encounter  Procedures  . EKG 12-Lead     Signed, Esmond Plants, M.D., Ph.D. 02/07/2018  [place], [place]

## 2018-02-07 ENCOUNTER — Ambulatory Visit (INDEPENDENT_AMBULATORY_CARE_PROVIDER_SITE_OTHER): Payer: PPO | Admitting: Internal Medicine

## 2018-02-07 ENCOUNTER — Ambulatory Visit: Payer: PPO | Admitting: Cardiovascular Disease

## 2018-02-07 ENCOUNTER — Encounter: Payer: Self-pay | Admitting: Cardiovascular Disease

## 2018-02-07 ENCOUNTER — Encounter: Payer: Self-pay | Admitting: Internal Medicine

## 2018-02-07 VITALS — BP 110/74 | HR 123 | Ht 67.0 in | Wt 198.0 lb

## 2018-02-07 VITALS — BP 100/60 | HR 132 | Ht 67.0 in | Wt 197.2 lb

## 2018-02-07 DIAGNOSIS — Z87891 Personal history of nicotine dependence: Secondary | ICD-10-CM

## 2018-02-07 DIAGNOSIS — I10 Essential (primary) hypertension: Secondary | ICD-10-CM

## 2018-02-07 DIAGNOSIS — I483 Typical atrial flutter: Secondary | ICD-10-CM | POA: Diagnosis not present

## 2018-02-07 DIAGNOSIS — J432 Centrilobular emphysema: Secondary | ICD-10-CM

## 2018-02-07 DIAGNOSIS — E1165 Type 2 diabetes mellitus with hyperglycemia: Secondary | ICD-10-CM | POA: Diagnosis not present

## 2018-02-07 DIAGNOSIS — J441 Chronic obstructive pulmonary disease with (acute) exacerbation: Secondary | ICD-10-CM | POA: Diagnosis not present

## 2018-02-07 MED ORDER — DOXYCYCLINE HYCLATE 100 MG PO CAPS: 100.0000 mg | ORAL_CAPSULE | Freq: Two times a day (BID) | ORAL | 0 refills | Status: DC

## 2018-02-07 MED ORDER — APIXABAN 5 MG PO TABS: 5.0000 mg | ORAL_TABLET | Freq: Two times a day (BID) | ORAL | 11 refills | Status: DC

## 2018-02-07 MED ORDER — PREDNISONE 20 MG PO TABS: 40.0000 mg | ORAL_TABLET | Freq: Every day | ORAL | 0 refills | Status: DC

## 2018-02-07 MED ORDER — DILTIAZEM HCL ER COATED BEADS 180 MG PO CP24: 180.0000 mg | ORAL_CAPSULE | Freq: Every day | ORAL | 3 refills | Status: DC

## 2018-02-07 MED ORDER — GUAIFENESIN-CODEINE 100-10 MG/5ML PO SOLN: 5.0000 mL | ORAL | 0 refills | Status: DC | PRN

## 2018-02-07 NOTE — Patient Instructions (Signed)
CONGRATULATIONS!!!!!! YOU STOPPED SMOKING!!!!    START DOXYCYCLINE START PREDNISONE 40 MG DAILY FOR 10 DAYS  COUGH MEDICINE AS NEEDED

## 2018-02-07 NOTE — Progress Notes (Signed)
MRN# [id number] [name] [birth year]   NG:EXBMWU up COPD  Brief History: HPI 01/03/15 -72 year old male with a past medical history significant for atrial flutter, COPD, hypertension referred by Dr.Golan for COPD optimization and preop pulmonary evaluation prior to scheduled cardioversion for atrial flutter one year ago   HPI Patient presents today for follow-up visit of his COPD.   Doing well off of Spiriva Has intermittent SOB but COPD seems to be well controlled Uses oxygen at night 2.5 l [place]  No distress at this time + signs of infection at this time +productive cough and cough and wheezing Feeling feverish +COPD exacerbation  Quit tobacco 2 months ago!!     Medication:    Current Outpatient Medications:  .  acetaminophen (TYLENOL) 500 MG tablet, Take 500-1,000 mg by mouth every 6 (six) hours as needed (for pain.)., Disp: , Rfl:  .  apixaban (ELIQUIS) 5 MG TABS tablet, Take 1 tablet (5 mg total) by mouth 2 (two) times daily., Disp: 60 tablet, Rfl: 11 .  budesonide-formoterol (SYMBICORT) 80-4.5 MCG/ACT inhaler, Inhale 1 puff into the lungs 2 times daily at 12 noon and 4 pm., Disp: 1 Inhaler, Rfl: 0 .  diltiazem (CARDIZEM CD) 180 MG 24 hr capsule, Take 1 capsule (180 mg total) by mouth daily., Disp: 90 capsule, Rfl: 3 .  doxycycline (VIBRAMYCIN) 100 MG capsule, Take 1 capsule (100 mg total) by mouth 2 (two) times daily., Disp: 20 capsule, Rfl: 0 .  glucose blood (FREESTYLE LITE) test strip, Check blood sugar 2x a day as directed, Disp: , Rfl:  .  metFORMIN (GLUCOPHAGE) 1000 MG tablet, Take 1 tablet (1,000 mg total) by mouth 2 (two) times daily with a meal., Disp: 180 tablet, Rfl: 0 .  metoprolol succinate (TOPROL-XL) 50 MG 24 hr tablet, Take 50 mg by mouth every evening. Take with or immediately following a meal. , Disp: , Rfl:  .  rosuvastatin (CRESTOR) 40 MG tablet, TAKE 1 TABLET BY MOUTH ONCE DAILY (Patient taking differently: TAKE 1 TABLET BY MOUTH ONCE DAILY AT  BEDTIME.), Disp: 90 tablet, Rfl: 2 .  vitamin B-12 (CYANOCOBALAMIN) 500 MCG tablet, Take 500 mcg by mouth daily., Disp: , Rfl: \   Review of Systems: Gen:  Denies  fever, sweats, chills HEENT: Denies blurred vision, double vision, ear pain, eye pain, hearing loss, nose bleeds, sore throat Cvc:  No dizziness, chest pain or heaviness Resp:   Admits to: Mild shortness of breath with exertion, this is chronic, mild productive yellow  sputum over the past 3 days +wheezing Gi: Denies swallowing difficulty, stomach pain, nausea or vomiting, diarrhea, constipation, bowel incontinence Other:  All other systems negative   Allergies:  Levaquin [levofloxacin in d5w]  Physical Examination:  BP 110/74 (BP Location: Left Arm, Cuff Size: Normal)   Pulse (!) 123   Ht 5\' 7"  (1.702 m)   Wt 198 lb (89.8 kg)   SpO2 93%   BMI 31.01 kg/m   General Appearance: No distress  HEENT: PERRLA, no ptosis, no other lesions noticed Pulmonary:normal breath sounds., diaphragmatic excursion normal + wheezing Cardiovascular:  Normal S1,S2.  No m/r/g.     Abdomen:Exam: Benign, Soft, non-tender, No masses  Skin:   warm, no rashes, no ecchymosis  Extremities: normal, no cyanosis, clubbing, warm with normal capillary refill.     Pulmonary function testing 05/07/2015 FEV1 62% FEV1/FVC 57% RV 136 TLC 93 RV/TLC 144 DLCO uncorrected 67% Impression: Moderate obstruction with no significant broncho-dilator response. Moderate decrease in DLCO.  Coronary  function testing 08/18/2016 FEV1 40% FEV1/FVC 58% RV 170 TLC 95 RV/TLC 175 DLCO 58 Impression: Moderate-severe obstruction with minor bronchodilator response, 14% change in FVC, moderate decrease in DLCO 6MWT - total distance 1181 feet/360 m, low saturation 92%, highest heart rate 64; dyspnea score at the end of test is 0.     Assessment and Plan:   72 year old male past medical history of tobacco abuse, seen in follow up  for Moderate COPD Gold Stage A with  chronic hypoxic resp failure with ACUTE COPD EXACERBATION   1.COPD type A Currently COPD is well controlled  Given optimal treatment with Symbicort and Spiriva for a number of years by his primary care physician. So Will plan stop Spiriva -continue Symbicort and albuterol as needed   2.Review of PFT's Pulmonary function testing 05/07/2015: FEV1 60%, FEV1/FVC 57%. Moderate obstruction and a +Bronchdilator response, moderate decrease in DLCO. PFT 08/24/16: FEV1 48%, FEV1/FVC: 58% - significant for  mod-severe obstruction -   3.SOB-likely from COPD, deconditioned states  4.Chronic Hypoxic resp failure -Patient benefits from oxygen therapy 2.5L [place] at night -recommend using oxygen as prescribed -patient needs this for survival   5.COPD exacerbation Will be prescribed Doxycycline and prednisone 40 mg daily for 10 days  Follow up in 6 months  Patient satisfied with Plan of action and management. All questions answered  Corrin Parker, M.D.  [facility]  Medical Director [place] [facility] Department

## 2018-02-07 NOTE — Patient Instructions (Addendum)
Medication Instructions:  Your physician has recommended you make the following change in your medication:  1. START Eliquis 5 mg twice a day 2. STOP Losartan 3. START Diltiazem 180 mg once a day 4. START Doxycycline 100 mg twice a day for 10 days  Medication Samples have been provided to the patient.  Drug name: Eliquis       Strength: 5 mg        Qty: 4 boxes  LOT: [id number]  Exp.Date: 6/21   Labwork:  No new labs needed  Testing/Procedures:  No further testing at this time   Follow-Up: It was a pleasure seeing you in the office today. Please call us if you have new issues that need to be addressed before your next appt.  [phone]  Your physician wants you to follow-up in: 1 week   If you need a refill on your cardiac medications before your next appointment, please call your pharmacy.  For educational health videos Log in to : [email or web address] Or : [email or web address], password : [email or web address]

## 2018-02-08 DIAGNOSIS — J449 Chronic obstructive pulmonary disease, unspecified: Secondary | ICD-10-CM | POA: Diagnosis not present

## 2018-02-13 NOTE — Progress Notes (Signed)
Cardiology Office Note  Date:  02/15/2018   ID:  [name], [name] Nov 23, 1945, MRN [id number]  PCP:  [name], MD   Chief Complaint  Patient presents with  . other    1 wk f/u no complaints today. Meds reviewed verbally with pt.    HPI:  [name] is a 72 year old gentleman with   hypertension,  diabetes,  COPD,  long smoking history, polycythemia,   tachycardia.  Cardiology evaluated the patient 10/29/2014.  EKG showed atrial flutter with heart rate 140s up to 150.  long smoking history for 45 years.  started on beta blockers, diltiazem, digoxin, eventually started on amiodarone infusion with anticoagulation, eliquis. He had TEE and cardioversion on 10/30/2014. Normal sinus rhythm was successfully Cardioversion February 2016 Flutter April 2018 converted on his own within 1 week He presents for follow-up of his atrial flutter Who catheter ablation for his flutter October 2017 Flutter ablation 10/14/2017 Successful radiofrequency ablation of atrial flutter along the cavotricuspid isthmus with complete bidirectional isthmus block achieved.  Age-55, DM-1 HTN-1  CHADSVASc 3 Presents today for follow-up of his atrial flutter  Last visit 1 week ago rate 130 bpm, atrial flutter Today rate is 120 bpm COPD exacerbation, now better on antibiotics Does not know when he went into atrial flutter  denies any orthostasis, dizziness, shortness of breath on exertion Reports that he feels well in general  Unclear if he is taking his diltiazem he thinks so Compliant with his anticoagulation and metoprolol  EKG personally reviewed by myself on todays visit Shows Atrial flutter ventricular rate 120 bpm nonspecific ST abnormality  Other past medical history reviewed 10/11/2017, was in atrial flutter Electrophysiology study and radiofrequency catheter ablation on 10/14/17 by Dr Curt Bears. Seen by Dr. 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 Eddie Dibbles Compliant with his inhalers   total cholesterol 167, LDL 100, hemoglobin A1c 7.7  Previous cardioversion on 01/07/2015 which successfully restored normal sinus rhythm  TEE report showed ejection fraction 45-50%, right and left atrium mildly enlarged, RV mildly dilated with mildly reduced systolic function, small amount of plaque in the aorta  CT scan of the chest 10/29/2014 in the hospital showed bilateral predominantly lower lobe pulmonary opacity most consistent with pneumonia   PMH:   has a past medical history of Atrial flutter ([place]), CKD (chronic kidney disease), stage III ([place]), COPD (chronic obstructive pulmonary disease) ([place]), Diabetes mellitus without complication ([place]), Hyperlipidemia, Hypertension, and Polycythemia.  PSH:    Past Surgical History:  Procedure Laterality Date  . A-FLUTTER ABLATION N/A 10/14/2017   Procedure: A-FLUTTER ABLATION;  Surgeon: Constance Haw, MD;  Location: [place] [facility];  Service: Cardiovascular;  Laterality: N/A;  . CARDIOVERSION N/A 09/08/2017   Procedure: CARDIOVERSION;  Surgeon: [name], MD;  Location: [facility];  Service: Cardiovascular;  Laterality: N/A;  . ELECTROPHYSIOLOGIC STUDY N/A 10/27/2016   Procedure: A-Flutter  Ablation;  Surgeon: Deboraha Sprang, MD;  Location: [place] [facility];  Service: Cardiovascular;  Laterality: N/A;  . EUS N/A 12/22/2017   Procedure: FULL UPPER ENDOSCOPIC ULTRASOUND (EUS) RADIAL;  Surgeon: Reita Cliche, MD;  Location: [facility];  Service: Gastroenterology;  Laterality: N/A;  . TEE WITH CARDIOVERSION  12/16    Current Outpatient Medications  Medication Sig Dispense Refill  . acetaminophen (TYLENOL) 500 MG tablet Take 500-1,000 mg by mouth every 6 (six) hours as needed (for pain.).    Marland Kitchen apixaban (ELIQUIS) 5 MG TABS tablet Take 1 tablet (5 mg total) by mouth 2 (two) times daily. 60 tablet 11  . budesonide-formoterol (SYMBICORT) 80-4.5 MCG/ACT inhaler Inhale 1 puff into the lungs 2 times daily at 12 noon and 4 pm. 1 Inhaler 0  . doxycycline (VIBRAMYCIN) 100 MG capsule Take 1 capsule (100 mg total) by mouth 2 (two) times daily. 20 capsule 0  . glucose blood (FREESTYLE LITE) test strip Check blood sugar 2x a day as directed    . guaiFENesin-codeine 100-10 MG/5ML syrup Take 5 mLs by mouth every 4 (four) hours as needed for cough. 120 mL 0  . metFORMIN (GLUCOPHAGE) 1000 MG tablet Take 1 tablet (1,000 mg total) by mouth 2 (two) times daily with a meal. 180 tablet 0  . metoprolol succinate (TOPROL-XL) 50 MG 24 hr tablet Take 50 mg by mouth every evening. Take with or immediately following a meal.     . predniSONE (DELTASONE) 20 MG tablet Take 2 tablets (40 mg total) by mouth daily with breakfast. 10 days 20 tablet 0  . rosuvastatin (CRESTOR) 40 MG tablet TAKE 1 TABLET BY MOUTH ONCE DAILY (Patient taking differently: TAKE 1 TABLET BY MOUTH ONCE DAILY AT BEDTIME.) 90 tablet 2  . vitamin B-12 (CYANOCOBALAMIN) 500 MCG tablet Take 500 mcg by mouth daily.    Marland Kitchen diltiazem (CARDIZEM CD) 120 MG 24 hr capsule Take 1 capsule (120 mg total) by mouth 2 (two) times daily. 180 capsule 3   No current facility-administered medications for this visit.      Allergies:   Levaquin [levofloxacin in  d5w]   Social History:  The patient  reports that he quit smoking about 5 years ago. His smoking use included cigarettes. He has a 67.50 pack-year smoking history. He has never used smokeless tobacco. He reports that he drinks about 1.2 oz of alcohol per week. He reports that he does not use drugs.   Family History:   family history includes Hypertension in his father.    Review of Systems: Review of Systems  Constitutional: Negative.   Respiratory: Positive for shortness of breath.   Cardiovascular: Negative.   Gastrointestinal: Negative.   Musculoskeletal: Negative.   Neurological: Negative.   Psychiatric/Behavioral: Negative.   All other systems reviewed and are negative.    PHYSICAL EXAM: VS:  BP 120/78 (BP Location: Left Arm, Patient Position: Sitting, Cuff Size: Normal)   Pulse (!) 120   Ht 5\' 7"  (1.702 m)   Wt 200 lb (90.7 kg)   BMI 31.32 kg/m  , BMI Body mass index is 31.32 kg/m.  No significant change in exam Constitutional:  oriented to person, place, and time. No distress. Cough HENT:  Head: Normocephalic and atraumatic.  Eyes:  no discharge. No scleral icterus.  Neck: Normal range of motion. Neck supple.  No JVD present.  Cardiovascular: Tachycardia , regular rhythm ,  intact distal pulses. Exam reveals no gallop and no friction rub. No edema No murmur heard. Pulmonary/Chest: Effort normal and breath sounds normal. No stridor. No respiratory distress.  no wheezes.  no rales.  no tenderness.  Abdominal: Soft.  no distension.  no tenderness.  Musculoskeletal: Normal range of motion.  no  tenderness or deformity.  Neurological:  normal muscle tone. Coordination normal. No atrophy Skin: Skin is warm and dry. No rash noted. not diaphoretic.  Psychiatric:  normal mood and affect. behavior is normal. Thought content normal.    Recent Labs: 01/06/2018: ALT 22; BUN 43; Creatinine, Ser 2.07; Hemoglobin 16.3; Platelets 152; Potassium 4.9; Sodium 135    Lipid Panel No  results found for: CHOL, HDL, LDLCALC, TRIG    Wt Readings from Last 3 Encounters:  02/15/18 200 lb (90.7 kg)  02/07/18 198 lb (89.8 kg)  02/07/18 197 lb 4 oz (89.5 kg)       ASSESSMENT AND PLAN:   Essential hypertension -  Reports he is compliant with his medication but we still need to confirm he is taking diltiazem He thinks he is Recommend he change the diltiazem up to 120 mg twice daily stay on metoprolol  Coronary artery disease involving native coronary artery of native heart with angina pectoris ([place]) - Plan: EKG 12-Lead, CANCELED: EKG 12-Lead Currently with no symptoms of angina. No further workup at this time. Continue current medication regimen.  Stable  Typical atrial flutter (HCC) - Plan: EKG 12-Lead Case previously discussed with Dr. Caryl Comes and note sent to Dr. Curt Bears Asymptomatic atrial flutter with rapid ventricular rate Will increase diltiazem up to 120 twice daily continue metoprolol Unable to start digoxin given renal dysfunction He does not want transesophageal echo prefers to wait 2-3 weeks and then try cardioversion  Centrilobular emphysema ([place]) Stopped smoking many years ago, maintained on his inhalers Previously given antibiotics for bronchitis, improved on today's visit  Uncontrolled type 2 diabetes mellitus with other circulatory complication, without long-term current use of insulin ([place])  recommend he continue to follow a low carbohydrate diet   Total encounter time more than 25 minutes  Greater than 50% was spent in counseling and coordination of care with the patient   F/U with nursing in 3 weeks for EKG , schedule cardioversion 3 weeks    Orders Placed This Encounter  Procedures  . EKG 12-Lead     Signed, Esmond Plants, M.D., Ph.D. 02/15/2018  [place], [place]

## 2018-02-15 ENCOUNTER — Encounter: Payer: Self-pay | Admitting: Cardiovascular Disease

## 2018-02-15 ENCOUNTER — Ambulatory Visit: Payer: PPO | Admitting: Cardiovascular Disease

## 2018-02-15 ENCOUNTER — Telehealth: Payer: Self-pay | Admitting: Cardiovascular Disease

## 2018-02-15 VITALS — BP 120/78 | HR 120 | Ht 67.0 in | Wt 200.0 lb

## 2018-02-15 DIAGNOSIS — I25118 Atherosclerotic heart disease of native coronary artery with other forms of angina pectoris: Secondary | ICD-10-CM | POA: Diagnosis not present

## 2018-02-15 DIAGNOSIS — J432 Centrilobular emphysema: Secondary | ICD-10-CM

## 2018-02-15 DIAGNOSIS — I483 Typical atrial flutter: Secondary | ICD-10-CM

## 2018-02-15 DIAGNOSIS — E1165 Type 2 diabetes mellitus with hyperglycemia: Secondary | ICD-10-CM

## 2018-02-15 DIAGNOSIS — Z87891 Personal history of nicotine dependence: Secondary | ICD-10-CM | POA: Diagnosis not present

## 2018-02-15 DIAGNOSIS — I10 Essential (primary) hypertension: Secondary | ICD-10-CM | POA: Diagnosis not present

## 2018-02-15 MED ORDER — METOPROLOL SUCCINATE ER 50 MG PO TB24: 50.0000 mg | ORAL_TABLET | Freq: Every evening | ORAL | 2 refills | Status: DC

## 2018-02-15 MED ORDER — DILTIAZEM HCL ER COATED BEADS 120 MG PO CP24: 120.0000 mg | ORAL_CAPSULE | Freq: Two times a day (BID) | ORAL | 3 refills | Status: DC

## 2018-02-15 NOTE — Telephone Encounter (Signed)
Patient calling to see if nurse can go over home meds with him

## 2018-02-15 NOTE — Telephone Encounter (Signed)
Called patient. He stated Dr Rockey Situ asked him to see if he had a "blue pill" at home he was taking. Patient did not have any blue pills at home. He did say he had Cartia 180 mg one a day in a bottle. He also said he stopped metoprolol on 02/07/18 because he didn't have any refills. Today, Dr Rockey Situ changed diltiazem to 120 mg by mouth twice a day. Patient verbalized understanding to not take the Cartia 180 mg but to take the diltiazem 120 mg twice a day as printed on his AVS today. Refill for metoprolol sent to patient's pharmacy. He did not have any further questions at this time. Routing to Dr Rockey Situ for review.

## 2018-02-15 NOTE — Patient Instructions (Addendum)
Medication Instructions:  Your physician has recommended you make the following change in your medication:  1. INCREASE Diltiazem 120 mg twice a day  Labwork:  No new labs needed  Testing/Procedures:  No further testing at this time   Follow-Up: It was a pleasure seeing you in the office today. Please call us if you have new issues that need to be addressed before your next appt.  [phone]  Your physician wants you to follow-up in: 3 weeks for EKG to set up cardioversion  Your physician recommends that you schedule a follow-up appointment in: 6 weeks.   If you need a refill on your cardiac medications before your next appointment, please call your pharmacy.  For educational health videos Log in to : [email or web address] Or : [email or web address], password : [email or web address]

## 2018-02-17 NOTE — Telephone Encounter (Signed)
Can we call to follow-up on any dizzy spells now on higher dose diltiazem and metoprolol Can he give [place] a pulse rate Was elevated and he needs to hang in there for several more weeks before cardioversion Previously was asymptomatic

## 2018-02-17 NOTE — Telephone Encounter (Signed)
S/w patient. Patient states he is doing well and has not had any dizzy spells. He was supposed to pick up metoprolol the other day but has not yet.  He is going today to get the metoprolol. He does not have anyway to check his pulse. I asked him to check it today at the pharmacy and call us with the readings. He has been taking the diltiazem 120 mg BID.

## 2018-02-20 NOTE — Telephone Encounter (Signed)
I called and spoke with the patient. He states he did pick up his metoprolol this weekend and started this yesterday. He confirms he has been taking diltiazem.  I inquired if he has been checking his HR's at home.  He states he could not find his monitor. I advised him that since he just started metoprolol yesterday, to wait a couple of days then go to the pharmacy for a HR check and call us back with the readings. He is agreeable and voices understanding.

## 2018-02-28 NOTE — Telephone Encounter (Signed)
S/w patient. He stated he got himself a new BP cuff. Yesterday, his BP was 107/42 and HR 86. He denies any dizziness, chest pain, or shortness of breath. Advised patient that was an appropriate reading and that he could check his BP/HR once a day. He verbalized understanding and will call us if his BP/HR makes any trending changes.

## 2018-02-28 NOTE — Telephone Encounter (Signed)
Pt calling stating he just received his monitor yesterday and is calling in his HR readings  02/27/18  HR 107 Night time HR 85  02/28/18  AM HR 86   Would like to know if he needs to keep taking them or is this enough   Please advise

## 2018-03-07 ENCOUNTER — Telehealth: Payer: Self-pay | Admitting: Cardiovascular Disease

## 2018-03-07 ENCOUNTER — Ambulatory Visit (INDEPENDENT_AMBULATORY_CARE_PROVIDER_SITE_OTHER): Payer: PPO

## 2018-03-07 VITALS — BP 120/74 | HR 106 | Resp 16 | Wt 197.4 lb

## 2018-03-07 DIAGNOSIS — I482 Chronic atrial fibrillation, unspecified: Secondary | ICD-10-CM

## 2018-03-07 NOTE — Progress Notes (Signed)
1.) Reason for visit: EKG  2.) Name of MD requesting visit: Dr. Rockey Situ  3). H&P: History of atrial fibrilllation/flutter, CAD, HTN  4). ROS related to problem: Patient in atrial flutter at 02/2018 OV. He was to continue metoprolol 50mg  QD, eliquis 5mg  BID and diltiazem increased to 120mg  BID.   3.) Assessment and plan per MD: Repeat EKG today showing atrial flutter. Reviewed with Dr. Rockey Situ who instructs to schedule cardioversion.    Education provided to patient, lab orders given. Will call patient with DCCV date. Patient agreeable with plan.

## 2018-03-07 NOTE — Patient Instructions (Addendum)
You are scheduled for a Cardioversion on ________________ with Dr.__Gollan___ Please arrive at the [facility] at _________ a.m. on the day of your procedure.  DIET INSTRUCTIONS:  Nothing to eat or drink after midnight except your medications with a small sip of water.         1) Labs: _Go to [facility] Entr[facility] to have labs done next week._  2) Medications:  HOLD Glucophage the morning of your procedure. YOU MAY TAKE ALL of your remaining medications with a small amount of water.  3) Must have a responsible person to drive you home.  4) Bring a current list of your medications and current insurance cards.    If you have any questions after you get home, please call the office at [phone]  Electrical Cardioversion Electrical cardioversion is the delivery of a jolt of electricity to restore a normal rhythm to the heart. A rhythm that is too fast or is not regular keeps the heart from pumping well. In this procedure, sticky patches or metal paddles are placed on the chest to deliver electricity to the heart from a device. This procedure may be done in an emergency if:  There is low or no blood pressure as a result of the heart rhythm.  Normal rhythm must be restored as fast as possible to protect the brain and heart from further damage.  It may save a life.  This procedure may also be done for irregular or fast heart rhythms that are not immediately life-threatening. Tell a health care provider about:  Any allergies you have.  All medicines you are taking, including vitamins, herbs, eye drops, creams, and over-the-counter medicines.  Any problems you or family members have had with anesthetic medicines.  Any blood disorders you have.  Any surgeries you have had.  Any medical conditions you have.  Whether you are pregnant or may be pregnant. What are the risks? Generally, this is a safe procedure. However, problems may occur, including:  Allergic reactions  to medicines.  A blood clot that breaks free and travels to other parts of your body.  The possible return of an abnormal heart rhythm within hours or days after the procedure.  Your heart stopping (cardiac arrest). This is rare.  What happens before the procedure? Medicines  Your health care provider may have you start taking: ? Blood-thinning medicines (anticoagulants) so your blood does not clot as easily. ? Medicines may be given to help stabilize your heart rate and rhythm.  Ask your health care provider about changing or stopping your regular medicines. This is especially important if you are taking diabetes medicines or blood thinners. General instructions  Plan to have someone take you home from the hospital or clinic.  If you will be going home right after the procedure, plan to have someone with you for 24 hours.  Follow instructions from your health care provider about eating or drinking restrictions. What happens during the procedure?  To lower your risk of infection: ? Your health care team will wash or sanitize their hands. ? Your skin will be washed with soap.  An IV tube will be inserted into one of your veins.  You will be given a medicine to help you relax (sedative).  Sticky patches (electrodes) or metal paddles may be placed on your chest.  An electrical shock will be delivered. The procedure may vary among health care providers and hospitals. What happens after the procedure?  Your blood pressure, heart rate,  breathing rate, and blood oxygen level will be monitored until the medicines you were given have worn off.  Do not drive for 24 hours if you were given a sedative.  Your heart rhythm will be watched to make sure it does not change. This information is not intended to replace advice given to you by your health care provider. Make sure you discuss any questions you have with your health care provider. Document Released: 10/22/2002 Document  Revised: 06/30/2016 Document Reviewed: 05/07/2016 Elsevier Interactive Patient Education  2017 [employer].

## 2018-03-07 NOTE — Telephone Encounter (Signed)
Spoke with patient and reviewed that we have him scheduled to come in May 8th for cardioversion over at the [facility] with arrival time of 07:00AM. Instructed him to have labs done next week when possible and he verbalized understanding of all instructions with no further questions at this time.

## 2018-03-09 ENCOUNTER — Other Ambulatory Visit: Payer: Self-pay

## 2018-03-09 DIAGNOSIS — I482 Chronic atrial fibrillation, unspecified: Secondary | ICD-10-CM

## 2018-03-11 DIAGNOSIS — J449 Chronic obstructive pulmonary disease, unspecified: Secondary | ICD-10-CM | POA: Diagnosis not present

## 2018-03-15 ENCOUNTER — Other Ambulatory Visit
Admission: RE | Admit: 2018-03-15 | Discharge: 2018-03-15 | Disposition: A | Discharge status: Home / Self Care | Payer: PPO | Source: Ambulatory Visit | Attending: Cardiovascular Disease | Admitting: Cardiovascular Disease

## 2018-03-15 DIAGNOSIS — I482 Chronic atrial fibrillation: Secondary | ICD-10-CM | POA: Diagnosis not present

## 2018-03-15 LAB — CBC WITH DIFFERENTIAL/PLATELET
BLASTS: 0 %
Band Neutrophils: 0 %
Basophils Absolute: 0.1 10*3/uL (ref 0–0.1)
Basophils Relative: 1 %
EOS ABS: 0.1 10*3/uL (ref 0–0.7)
Eosinophils Relative: 2 %
HEMATOCRIT: 40.6 % (ref 40.0–52.0)
HEMOGLOBIN: 13.5 g/dL (ref 13.0–18.0)
LYMPHS PCT: 22 %
Lymphs Abs: 1.5 10*3/uL (ref 1.0–3.6)
MCH: 31.7 pg (ref 26.0–34.0)
MCHC: 33.3 g/dL (ref 32.0–36.0)
MCV: 95.4 fL (ref 80.0–100.0)
Metamyelocytes Relative: 1 %
Monocytes Absolute: 0.5 10*3/uL (ref 0.2–1.0)
Monocytes Relative: 7 %
Myelocytes: 4 %
NEUTROS PCT: 63 %
NRBC: 0 /100{WBCs}
Neutro Abs: 4.7 10*3/uL (ref 1.4–6.5)
OTHER: 0 %
PROMYELOCYTES RELATIVE: 0 %
Platelets: 176 10*3/uL (ref 150–440)
RBC: 4.26 MIL/uL — AB (ref 4.40–5.90)
RDW: 13.1 % (ref 11.5–14.5)
WBC: 6.9 10*3/uL (ref 3.8–10.6)

## 2018-03-15 LAB — PROTIME-INR
INR: 1.1
PROTHROMBIN TIME: 14.1 s (ref 11.4–15.2)

## 2018-03-15 LAB — BASIC METABOLIC PANEL
Anion gap: 9 (ref 5–15)
BUN: 32 mg/dL — ABNORMAL HIGH (ref 6–20)
CO2: 28 mmol/L (ref 22–32)
CREATININE: 2.45 mg/dL — AB (ref 0.61–1.24)
Calcium: 9 mg/dL (ref 8.9–10.3)
Chloride: 103 mmol/L (ref 101–111)
GFR calc non Af Amer: 25 mL/min — ABNORMAL LOW (ref >60)
GFR, EST AFRICAN AMERICAN: 28 mL/min — AB (ref >60)
Glucose, Bld: 154 mg/dL — ABNORMAL HIGH (ref 65–99)
Potassium: 3.9 mmol/L (ref 3.5–5.1)
Sodium: 140 mmol/L (ref 135–145)

## 2018-03-15 LAB — PATHOLOGIST SMEAR REVIEW

## 2018-03-21 ENCOUNTER — Other Ambulatory Visit: Payer: Self-pay | Admitting: Cardiovascular Disease

## 2018-03-22 ENCOUNTER — Ambulatory Visit: Payer: PPO | Admitting: Anesthesiology

## 2018-03-22 ENCOUNTER — Encounter
Admission: RE | Disposition: A | Discharge status: Home / Self Care | Payer: Self-pay | Source: Ambulatory Visit | Attending: Cardiovascular Disease

## 2018-03-22 ENCOUNTER — Encounter: Payer: Self-pay | Admitting: *Deleted

## 2018-03-22 ENCOUNTER — Ambulatory Visit
Admission: RE | Admit: 2018-03-22 | Discharge: 2018-03-22 | Disposition: A | Discharge status: Home / Self Care | Payer: PPO | Source: Ambulatory Visit | Attending: Cardiovascular Disease | Admitting: Cardiovascular Disease

## 2018-03-22 DIAGNOSIS — E1122 Type 2 diabetes mellitus with diabetic chronic kidney disease: Secondary | ICD-10-CM | POA: Diagnosis not present

## 2018-03-22 DIAGNOSIS — Z7984 Long term (current) use of oral hypoglycemic drugs: Secondary | ICD-10-CM | POA: Diagnosis not present

## 2018-03-22 DIAGNOSIS — Z79899 Other long term (current) drug therapy: Secondary | ICD-10-CM | POA: Diagnosis not present

## 2018-03-22 DIAGNOSIS — Z87891 Personal history of nicotine dependence: Secondary | ICD-10-CM | POA: Diagnosis not present

## 2018-03-22 DIAGNOSIS — I129 Hypertensive chronic kidney disease with stage 1 through stage 4 chronic kidney disease, or unspecified chronic kidney disease: Secondary | ICD-10-CM | POA: Insufficient documentation

## 2018-03-22 DIAGNOSIS — J449 Chronic obstructive pulmonary disease, unspecified: Secondary | ICD-10-CM | POA: Insufficient documentation

## 2018-03-22 DIAGNOSIS — N183 Chronic kidney disease, stage 3 (moderate): Secondary | ICD-10-CM | POA: Diagnosis not present

## 2018-03-22 DIAGNOSIS — E785 Hyperlipidemia, unspecified: Secondary | ICD-10-CM | POA: Diagnosis not present

## 2018-03-22 DIAGNOSIS — I483 Typical atrial flutter: Secondary | ICD-10-CM | POA: Diagnosis not present

## 2018-03-22 DIAGNOSIS — I4891 Unspecified atrial fibrillation: Secondary | ICD-10-CM | POA: Diagnosis not present

## 2018-03-22 DIAGNOSIS — I2510 Atherosclerotic heart disease of native coronary artery without angina pectoris: Secondary | ICD-10-CM | POA: Diagnosis not present

## 2018-03-22 DIAGNOSIS — N189 Chronic kidney disease, unspecified: Secondary | ICD-10-CM | POA: Diagnosis not present

## 2018-03-22 HISTORY — PX: CARDIOVERSION: EP1203

## 2018-03-22 LAB — GLUCOSE, CAPILLARY: Glucose-Capillary: 125 mg/dL — ABNORMAL HIGH (ref 65–99)

## 2018-03-22 SURGERY — CARDIOVERSION (CATH LAB)
Anesthesia: General

## 2018-03-22 MED ORDER — SODIUM CHLORIDE 0.9 % IV SOLN
INTRAVENOUS | Status: DC
  Administered 2018-03-22 (×2): via INTRAVENOUS

## 2018-03-22 MED ORDER — PROPOFOL 10 MG/ML IV BOLUS
INTRAVENOUS | Status: DC | PRN
  Administered 2018-03-22: 10 mg via INTRAVENOUS
  Administered 2018-03-22: 40 mg via INTRAVENOUS

## 2018-03-22 NOTE — Anesthesia Procedure Notes (Signed)
Date/Time: 03/22/2018 7:50 AM Performed by: Allean Found, CRNA Pre-anesthesia Checklist: Emergency Drugs available, Suction available, Patient identified, Patient being monitored and Timeout performed Patient Re-evaluated:Patient Re-evaluated prior to induction Oxygen Delivery Method: Nasal cannula Placement Confirmation: positive ETCO2

## 2018-03-22 NOTE — Anesthesia Post-op Follow-up Note (Signed)
Anesthesia QCDR form completed.        

## 2018-03-22 NOTE — CV Procedure (Signed)
Cardioversion procedure note For atrial flutter, typical  Procedure Details:  Consent: Risks of procedure as well as the alternatives and risks of each were explained to the (patient/caregiver). Consent for procedure obtained.  Time Out: Verified patient identification, verified procedure, site/side was marked, verified correct patient position, special equipment/implants available, medications/allergies/relevent history reviewed, required imaging and test results available. Performed  Patient placed on cardiac monitor, pulse oximetry, supplemental oxygen as necessary.  Sedation given: propofol IV, Dr. Adams Pacer pads placed anterior and posterior chest.   Cardioverted 1 time(s).  Cardioverted at  150 J. Synchronized biphasic Converted to NSR   Evaluation: Findings: Post procedure EKG shows: NSR Complications: None Patient did tolerate procedure well.  Time Spent Directly with the Patient:  45 minutes   [name], M.D., Ph.D.  

## 2018-03-22 NOTE — Transfer of Care (Signed)
Immediate Anesthesia Transfer of Care Note  Patient: [name]  Procedure(s) Performed: CARDIOVERSION (N/A )  Patient Location: PACU  Anesthesia Type:General  Level of Consciousness: awake  Airway & Oxygen Therapy: Patient Spontanous Breathing and Patient connected to nasal cannula oxygen  Post-op Assessment: Report given to RN and Post -op Vital signs reviewed and stable  Post vital signs: Reviewed and stable  Last Vitals:  Vitals Value Taken Time  BP 83/49 03/22/2018  7:58 AM  Temp    Pulse    Resp    SpO2      Last Pain:  Vitals:   03/22/18 0718  TempSrc: Oral  PainSc: 0-No pain         Complications: No apparent anesthesia complications

## 2018-03-22 NOTE — H&P (Signed)
H&P Addendum, pre-cardioversion  Patient was seen and evaluated prior to -cardioversion procedure Symptoms, prior testing details again confirmed with the patient Patient examined, no significant change from prior exam Lab work reviewed in detail personally by myself Patient understands risk and benefit of the procedure, willing to proceed  Signed, Tim Cassy Sprowl, MD, Ph.D [facility]  

## 2018-03-22 NOTE — Anesthesia Postprocedure Evaluation (Signed)
Anesthesia Post Note  Patient: [name]  Procedure(s) Performed: CARDIOVERSION (N/A )  Patient location during evaluation: PACU Anesthesia Type: General Level of consciousness: awake and alert Pain management: pain level controlled Vital Signs Assessment: post-procedure vital signs reviewed and stable Respiratory status: spontaneous breathing, nonlabored ventilation, respiratory function stable and patient connected to nasal cannula oxygen Cardiovascular status: blood pressure returned to baseline and stable Postop Assessment: no apparent nausea or vomiting Anesthetic complications: no     Last Vitals:  Vitals:   03/22/18 0815 03/22/18 0830  BP: 109/71 119/73  Pulse:    Resp: 16   Temp:    SpO2: 97% 96%    Last Pain:  Vitals:   03/22/18 0830  TempSrc:   PainSc: 0-No pain                 Molli Barrows

## 2018-03-22 NOTE — Anesthesia Preprocedure Evaluation (Signed)
Anesthesia Evaluation  Patient identified by MRN, date of birth, ID band Patient awake    Reviewed: Allergy & Precautions, H&P , NPO status , Patient's Chart, lab work & pertinent test results, reviewed documented beta blocker date and time   Airway Mallampati: II   Neck ROM: full    Dental  (+) Poor Dentition   Pulmonary neg pulmonary ROS, COPD, former smoker,    Pulmonary exam normal        Cardiovascular Exercise Tolerance: Poor hypertension, On Medications + CAD  negative cardio ROS Normal cardiovascular exam Rhythm:regular Rate:Normal     Neuro/Psych negative neurological ROS  negative psych ROS   GI/Hepatic negative GI ROS, Neg liver ROS,   Endo/Other  negative endocrine ROSdiabetes, Well Controlled, Type 2, Oral Hypoglycemic Agents  Renal/GU CRFRenal diseasenegative Renal ROS  negative genitourinary   Musculoskeletal   Abdominal   Peds  Hematology negative hematology ROS (+)   Anesthesia Other Findings Past Medical History: No date: Atrial flutter ([place])     Comment:  a. s/p ablation 09/2017. No date: CKD (chronic kidney disease), stage III (HCC) No date: COPD (chronic obstructive pulmonary disease) (HCC) No date: Diabetes mellitus without complication (HCC) No date: Hyperlipidemia No date: Hypertension No date: Polycythemia Past Surgical History: 10/14/2017: A-FLUTTER ABLATION; N/A     Comment:  Procedure: A-FLUTTER ABLATION;  Surgeon: Constance Haw, MD;  Location: [place] [facility];  Service:               Cardiovascular;  Laterality: N/A; 09/08/2017: CARDIOVERSION; N/A     Comment:  Procedure: CARDIOVERSION;  Surgeon: [name],               MD;  Location: [facility];  Service: Cardiovascular;                Laterality: N/A; 10/27/2016: ELECTROPHYSIOLOGIC STUDY; N/A     Comment:  Procedure: A-Flutter Ablation;  Surgeon: Deboraha Sprang,              MD;  Location: [facility];  Service:               Cardiovascular;  Laterality: N/A; 12/22/2017: EUS; N/A     Comment:  Procedure: FULL UPPER ENDOSCOPIC ULTRASOUND (EUS)               RADIAL;  Surgeon: Reita Cliche, MD;  Location: [facility]               ENDOSCOPY;  Service: Gastroenterology;  Laterality: N/A; 12/16: TEE WITH CARDIOVERSION BMI    Body Mass Index:  30.85 kg/m     Reproductive/Obstetrics negative OB ROS                             Anesthesia Physical Anesthesia Plan  ASA: IV  Anesthesia Plan: General   Post-op Pain Management:    Induction:   PONV Risk Score and Plan:   Airway Management Planned:   Additional Equipment:   Intra-op Plan:   Post-operative Plan:   Informed Consent: I have reviewed the patients History and Physical, chart, labs and discussed the procedure including the risks, benefits and alternatives for the proposed anesthesia with the patient or authorized representative who has indicated his/her understanding and acceptance.   Dental Advisory Given  Plan Discussed with: CRNA  Anesthesia Plan Comments:  Anesthesia Quick Evaluation  

## 2018-03-23 ENCOUNTER — Encounter: Payer: Self-pay | Admitting: Cardiovascular Disease

## 2018-03-26 NOTE — Progress Notes (Signed)
Cardiology Office Note  Date:  03/27/2018   ID:  Carlos, Galvan 08-31-1946, MRN [id number]  PCP:  Carlos [place], MD   Chief Complaint  Patient presents with  . other    6 week follow up s/p cardioversion. Meds reviewed by the pt.'s bottles. "doing well."     HPI:  [name] is a 72 year old gentleman with   hypertension,  diabetes,  COPD,  long smoking history, polycythemia,   tachycardia.  Cardiology evaluated the patient 10/29/2014.  EKG showed atrial flutter with heart rate 140s up to 150.  long smoking history for 45 years.  started on beta blockers, diltiazem, digoxin, eventually started on amiodarone infusion with anticoagulation, eliquis. He had TEE and cardioversion on 10/30/2014. Normal sinus rhythm was successfully Cardioversion February 2016 Flutter April 2018 converted on his own within 1 week He presents for follow-up of his atrial flutter Who catheter ablation for his flutter October 2017 Flutter ablation 10/14/2017 Successful radiofrequency ablation of atrial flutter along the cavotricuspid isthmus with complete bidirectional isthmus block achieved.  Age-89, DM-1 HTN-1  CHADSVASc 3 Presents today for follow-up of his atrial flutter  Successful cardioversion 03/22/2018 in follow-up today he is back in atrial flutter, asymptomatic had no idea Active, no regular exercise program Uses Symbicort twice a day, no rescue inhaler Denies any chest pain Chronic baseline shortness of breath Reports he stopped smoking one year ago  Prior to cardioversion rate was 130 atrial flutter Possibly exacerbated by COPD exacerbation, improved on antibiotics  Compliant with his anticoagulation , Diltiazem and metoprolol  EKG personally reviewed by myself on todays visit Shows Atrial flutter ventricular rate 87 bpm nonspecific ST abnormality  Other past medical history reviewed 10/11/2017, was in atrial flutter Electrophysiology study and radiofrequency catheter  ablation on 10/14/17 by Dr Curt Bears. Seen by Dr. 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 Eddie Dibbles Compliant with his inhalers   total cholesterol 167, LDL 100, hemoglobin A1c 7.7  Previous cardioversion on 01/07/2015 which successfully restored normal sinus rhythm  TEE report showed ejection fraction 45-50%, right and left atrium mildly enlarged, RV mildly dilated with mildly reduced systolic function, small amount of plaque in the aorta  CT scan of the chest 10/29/2014 in the hospital showed bilateral predominantly lower lobe pulmonary opacity most consistent with pneumonia   PMH:   has a past medical history of Atrial flutter ([place]), CKD (chronic kidney disease), stage III ([place]), COPD (chronic obstructive pulmonary disease) ([place]), Diabetes mellitus without complication ([place]), Hyperlipidemia, Hypertension, and Polycythemia.  PSH:    Past Surgical History:  Procedure Laterality Date  . A-FLUTTER ABLATION N/A 10/14/2017   Procedure: A-FLUTTER ABLATION;  Surgeon: Constance Haw, MD;  Location: [place];  Service: Cardiovascular;  Laterality:  N/A;  . CARDIOVERSION N/A 09/08/2017   Procedure: CARDIOVERSION;  Surgeon: [name], MD;  Location: [place]  [facility];  Service: Cardiovascular;  Laterality: N/A;  . CARDIOVERSION N/A 03/22/2018   Procedure: CARDIOVERSION;  Surgeon: [name], MD;  Location: [facility];  Service: Cardiovascular;  Laterality: N/A;  . ELECTROPHYSIOLOGIC STUDY N/A 10/27/2016   Procedure: A-Flutter Ablation;  Surgeon: Deboraha Sprang, MD;  Location: [place] [facility];  Service: Cardiovascular;  Laterality: N/A;  . EUS N/A 12/22/2017   Procedure: FULL UPPER ENDOSCOPIC ULTRASOUND (EUS) RADIAL;  Surgeon: Reita Cliche, MD;  Location: [facility] END[facility];  Service: Gastroenterology;  Laterality: N/A;  . TEE WITH CARDIOVERSION  12/16    Current Outpatient Medications  Medication Sig Dispense Refill  . acetaminophen (TYLENOL) 500 MG tablet Take 500-1,000 mg by mouth every 6 (six) hours as needed for moderate pain or headache.     Marland Kitchen apixaban (ELIQUIS) 5 MG TABS tablet Take 1 tablet (5 mg total) by mouth 2 (two) times daily. 60 tablet 11  . budesonide-formoterol (SYMBICORT) 80-4.5 MCG/ACT inhaler Inhale 1 puff into the lungs 2 times daily at 12 noon and 4 pm. 1 Inhaler 0  . diltiazem (CARDIZEM CD) 120 MG 24 hr capsule Take 1 capsule (120 mg total) by mouth 2 (two) times daily. 180 capsule 3  . guaiFENesin-codeine 100-10 MG/5ML syrup Take 5 mLs by mouth every 4 (four) hours as needed for cough. 120 mL 0  . metFORMIN (GLUCOPHAGE) 1000 MG tablet Take 1 tablet (1,000 mg total) by mouth 2 (two) times daily with a meal. 180 tablet 0  . metoprolol succinate (TOPROL-XL) 50 MG 24 hr tablet Take 1 tablet (50 mg total) by mouth every evening. Take with or immediately following a meal. 90 tablet 2  . rosuvastatin (CRESTOR) 40 MG tablet TAKE 1 TABLET BY MOUTH ONCE DAILY (Patient taking differently: Take 40 mg by mouth at bedtime) 90 tablet 2  . vitamin B-12 (CYANOCOBALAMIN) 500 MCG tablet Take 500 mcg by mouth daily.     No current facility-administered medications for this visit.      Allergies:   Levaquin [levofloxacin in d5w]   Social  History:  The patient  reports that he quit smoking about 5 years ago. His smoking use included cigarettes. He has a 67.50 pack-year smoking history. He has never used smokeless tobacco. He reports that he drinks about 1.2 oz of alcohol per week. He reports that he does not use drugs.   Family History:   family history includes Hypertension in his father.    Review of Systems: Review of Systems  Constitutional: Negative.   Respiratory: Positive for shortness of breath.   Cardiovascular: Negative.   Gastrointestinal: Negative.   Musculoskeletal: Negative.   Neurological: Negative.   Psychiatric/Behavioral: Negative.   All other systems reviewed and are negative.    PHYSICAL EXAM: VS:  BP 120/60 (BP Location: Left Arm, Patient Position: Sitting, Cuff Size: Normal)   Pulse 87   Ht 5\' 7"  (1.702 m)   Wt 198 lb (89.8 kg)   BMI 31.01 kg/m  , BMI Body mass index is 31.01 kg/m.  No significant change in exam Constitutional:  oriented to person, place, and time. No distress HENT:  Head: Normocephalic and atraumatic.  Eyes:  no discharge. No scleral icterus.  Neck: Normal range of motion. Neck supple. No JVD present.  Cardiovascular:, irregularly irregular intact distal pulses. Exam reveals no gallop and no friction rub. No edema No murmur heard. Pulmonary/Chest:  Effort normal and breath sounds normal. No stridor. No respiratory distress.  no wheezes.  no rales.  no tenderness.  Abdominal: Soft.  no distension.  no tenderness.  Musculoskeletal: Normal range of motion.  no  tenderness or deformity.  Neurological:  normal muscle tone. Coordination normal. No atrophy Skin: Skin is warm and dry. No rash noted. not diaphoretic.  Psychiatric:  normal mood and affect. behavior is normal. Thought content normal.    Recent Labs: 01/06/2018: ALT 22 03/15/2018: BUN 32; Creatinine, Ser 2.45; Hemoglobin 13.5; Platelets 176; Potassium 3.9; Sodium 140    Lipid Panel No results found for: CHOL,  HDL, LDLCALC, TRIG    Wt Readings from Last 3 Encounters:  03/27/18 198 lb (89.8 kg)  03/22/18 197 lb (89.4 kg)  03/07/18 197 lb 6.4 oz (89.5 kg)       ASSESSMENT AND PLAN:   Essential hypertension -  Will restart low-dose losartan25 mg cut in half Continue other medications  Coronary artery disease involving native coronary artery of native heart with angina pectoris ([place]) - Plan: EKG 12-Lead, CANCELED: EKG 12-Lead Currently with no symptoms of angina. No further workup at this time. Continue current medication regimen.  Stable  Typical atrial flutter (HCC) - Plan: EKG 12-Lead Case previously discussed with Dr. Caryl Comes and note sent to Dr. Curt Bears Previous ablation, not holding normal sinus rhythm Cardioversion recently normal sinus rhythm restored back in atrial flutter today Again continues to be asymptomatic rate better controlled now 87 bpm We'll continue current medications, follow up in 6 months We'll monitor pulse at home  Centrilobular emphysema ([place]) Stopped smoking many years ago, maintained on his inhalers Recommended he talk with primary care about rescue inhaler  Uncontrolled type 2 diabetes mellitus with other circulatory complication, without long-term current use of insulin ([place])  recommend he continue to follow a low carbohydrate diet Restart low-dose losartan   Total encounter time more than 25 minutes  Greater than 50% was spent in counseling and coordination of care with the patient    Orders Placed This Encounter  Procedures  . EKG 12-Lead     Signed, Esmond Plants, M.D., Ph.D. 03/27/2018  [place], [place]

## 2018-03-27 ENCOUNTER — Encounter: Payer: Self-pay | Admitting: Cardiovascular Disease

## 2018-03-27 ENCOUNTER — Ambulatory Visit (INDEPENDENT_AMBULATORY_CARE_PROVIDER_SITE_OTHER): Payer: PPO | Admitting: Cardiovascular Disease

## 2018-03-27 VITALS — BP 120/60 | HR 87 | Ht 67.0 in | Wt 198.0 lb

## 2018-03-27 DIAGNOSIS — I10 Essential (primary) hypertension: Secondary | ICD-10-CM

## 2018-03-27 DIAGNOSIS — I25118 Atherosclerotic heart disease of native coronary artery with other forms of angina pectoris: Secondary | ICD-10-CM | POA: Diagnosis not present

## 2018-03-27 DIAGNOSIS — I483 Typical atrial flutter: Secondary | ICD-10-CM | POA: Diagnosis not present

## 2018-03-27 DIAGNOSIS — J432 Centrilobular emphysema: Secondary | ICD-10-CM

## 2018-03-27 DIAGNOSIS — E1165 Type 2 diabetes mellitus with hyperglycemia: Secondary | ICD-10-CM

## 2018-03-27 MED ORDER — LOSARTAN POTASSIUM 25 MG PO TABS: 25.0000 mg | ORAL_TABLET | Freq: Every day | ORAL | 3 refills | Status: DC

## 2018-03-27 NOTE — Patient Instructions (Signed)
Medication Instructions:   OK to restart losartan 1/2 pill daily  Labwork:  No new labs needed  Testing/Procedures:  No further testing at this time   Follow-Up: It was a pleasure seeing you in the office today. Please call us if you have new issues that need to be addressed before your next appt.  [phone]  Your physician wants you to follow-up in: 6 months.  You will receive a reminder letter in the mail two months in advance. If you don't receive a letter, please call our office to schedule the follow-up appointment.  If you need a refill on your cardiac medications before your next appointment, please call your pharmacy.  For educational health videos Log in to : [email or web address] Or : [email or web address], password : [email or web address]

## 2018-03-28 NOTE — H&P (Signed)
H&P Addendum, pre-cardioversion  Patient was seen and evaluated prior to -cardioversion procedure Symptoms, prior testing details again confirmed with the patient Patient examined, no significant change from prior exam Lab work reviewed in detail personally by myself Patient understands risk and benefit of the procedure, willing to proceed  Signed, Tim Aldona Bryner, MD, Ph.D [facility]  

## 2018-04-10 DIAGNOSIS — J449 Chronic obstructive pulmonary disease, unspecified: Secondary | ICD-10-CM | POA: Diagnosis not present

## 2018-04-11 ENCOUNTER — Other Ambulatory Visit: Payer: PPO

## 2018-04-11 ENCOUNTER — Ambulatory Visit: Payer: PPO | Admitting: Oncology

## 2018-04-12 ENCOUNTER — Ambulatory Visit
Admission: RE | Admit: 2018-04-12 | Discharge: 2018-04-12 | Disposition: A | Discharge status: Home / Self Care | Payer: PPO | Source: Ambulatory Visit | Attending: Oncology | Admitting: Oncology

## 2018-04-12 DIAGNOSIS — D3A8 Other benign neuroendocrine tumors: Secondary | ICD-10-CM

## 2018-04-12 DIAGNOSIS — K862 Cyst of pancreas: Secondary | ICD-10-CM | POA: Diagnosis not present

## 2018-04-20 ENCOUNTER — Inpatient Hospital Stay: Payer: PPO | Attending: Oncology | Admitting: Oncology

## 2018-04-20 ENCOUNTER — Encounter: Payer: Self-pay | Admitting: Oncology

## 2018-04-20 VITALS — BP 102/65 | HR 51 | Temp 97.5°F | Resp 18 | Ht 67.0 in | Wt 198.7 lb

## 2018-04-20 DIAGNOSIS — C784 Secondary malignant neoplasm of small intestine: Secondary | ICD-10-CM | POA: Diagnosis not present

## 2018-04-20 DIAGNOSIS — Z79899 Other long term (current) drug therapy: Secondary | ICD-10-CM | POA: Insufficient documentation

## 2018-04-20 DIAGNOSIS — J449 Chronic obstructive pulmonary disease, unspecified: Secondary | ICD-10-CM | POA: Diagnosis not present

## 2018-04-20 DIAGNOSIS — Z7984 Long term (current) use of oral hypoglycemic drugs: Secondary | ICD-10-CM | POA: Diagnosis not present

## 2018-04-20 DIAGNOSIS — C786 Secondary malignant neoplasm of retroperitoneum and peritoneum: Secondary | ICD-10-CM

## 2018-04-20 DIAGNOSIS — C7A8 Other malignant neuroendocrine tumors: Secondary | ICD-10-CM | POA: Insufficient documentation

## 2018-04-20 DIAGNOSIS — I129 Hypertensive chronic kidney disease with stage 1 through stage 4 chronic kidney disease, or unspecified chronic kidney disease: Secondary | ICD-10-CM | POA: Insufficient documentation

## 2018-04-20 DIAGNOSIS — D3A8 Other benign neuroendocrine tumors: Secondary | ICD-10-CM

## 2018-04-20 DIAGNOSIS — N183 Chronic kidney disease, stage 3 (moderate): Secondary | ICD-10-CM | POA: Diagnosis not present

## 2018-04-20 DIAGNOSIS — D696 Thrombocytopenia, unspecified: Secondary | ICD-10-CM | POA: Insufficient documentation

## 2018-04-20 DIAGNOSIS — Z87891 Personal history of nicotine dependence: Secondary | ICD-10-CM | POA: Insufficient documentation

## 2018-04-20 DIAGNOSIS — E1122 Type 2 diabetes mellitus with diabetic chronic kidney disease: Secondary | ICD-10-CM | POA: Diagnosis not present

## 2018-04-20 DIAGNOSIS — E785 Hyperlipidemia, unspecified: Secondary | ICD-10-CM | POA: Insufficient documentation

## 2018-04-20 NOTE — Progress Notes (Signed)
No new changes noted today 

## 2018-04-20 NOTE — Progress Notes (Signed)
Hematology/Oncology Consult note [facility]  [phone] [phone]  Patient Care Team: [name], MD as PCP - General (Cardiology) [name], MD as Consulting Physician (Cardiology) Clent Jacks, RN as Registered Nurse   Name of the patient: [name]  [id number]  05-04-1946   Date of visit: 04/20/18  Diagnosis- metastatic small bowel carcinoid   Chief complaint/ Reason for visit- discuss CT scan results  Heme/Onc history: Patient is a 72 year old male who was seen by me for immature cells noted inperipheral blood.Results of blood work from 12/03/2016 was as follows: CBC showed white count of 7.6, H&H of 16.2/46.9 and a platelet count of 137. Differential at that time was normal and no immature cells were noted. Review of peripheral blood smear showed mild thrombocytopenia. Blasts are not identified. Peripheral flow cytometry did not reveal any significant diagnostic immunophenotypic abnormality. EPO level was normal at 8.1.JAK2 testing was not completed because of insufficient sample. BCR abl testing done on 10/27/2016 did not reveal any evidence of 9:22 translocation suggestive of CML.  Patient was asked to f/u with PCP.   He  underwent CT abdomen on 9/18  for decreased kidney function and hematuria. CT showed:Abnormal partially calcified soft tissue density in the ileocolic mesentery could represent a carcinoid metastasis. Differential considerations include the sequelae of prior mesenteric adenitis/panniculitis. Given traversing and superior mesenteric artery branches on the prior exam from 2012, aneurysm is possible but felt less likely. Consider further evaluation with octreotide scintigraphy. 3. L5 vague sclerotic lesion is not readily apparent on the prior exam. This may represent a bone island. If the patient is eventually diagnosed with a malignancy, osseous metastasis would be a concern. 4.  Possible soft tissue fullness within the pancreatic body and tail with equivocal peripancreatic edema. Correlate with signs/symptoms of pancreatitis.  Dotatate scan in November 2018 showed:IMPRESSION: 1. 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 2. Single nodal metastasis at the root of the mesenteric vessels. 3. Focal activity within the the small bowel of the lower abdomen midline without CT correlation. This could represent a primary neuroendocrine tumor neoplasm. Consider CT enterography for further evaluation. 4. No abnormal radiotracer accumulation within the pancreas.   MRI abdomen and pelvis with and without contrast in January 2019 showed:IMPRESSION: 1.8 x 2.0 cm enhancing mass in the right mid abdominal mesentery, hypermetabolic on dotatate PET, consistent with well-differentiated neuroendocrine tumor metastasis.  Additional 9 mm nodal metastasis in the central abdominal mesentery.  2.2 x 1.7 cm mass in the pancreatic head, without hypermetabolism on prior dotatate PET. Associated segmental atrophy the pancreatic body. Dilatation of the main pancreatic duct in the pancreatic tail. These findings are worrisome for pancreatic neoplasm, but are less suggestive of pancreatic adenocarcinoma or well-differentiated neuroendocrine tumor. EUS is suggested. 2.3 cm left adrenal adenoma, benign.  This was followed by EUS and FNA of the pancreatic head mass which showed:DIAGNOSIS:  A. PANCREATIC HEAD MASS; ENDOSCOPIC ULTRASOUND GUIDED FNA:  - POSITIVE FOR MALIGNANCY.  - NON-DUCTAL NEOPLASM PRESENT, SEE COMMENT.  Based on cytologic features differential diagnosis includes neuroendocrine tumor and asthenic cell carcinoma.  Case sent to [facility] for second opinion who also favored the diagnosis of neuroendocrine tumor noting that acinar cell carcinoma cannot be excluded  Interval history- Overall  patient is doing well and denies any symptoms of fatigue, abdominal pain cramping or diarrhea.  His appetite is good and he denies any unintentional weight loss.  ECOG PS- 1 Pain scale- 0  Review of systems- Review of Systems  Constitutional: Negative for chills, fever, malaise/fatigue and weight loss.  HENT: Negative for congestion, ear discharge and nosebleeds.   Eyes: Negative for blurred vision.  Respiratory: Negative for cough, hemoptysis, sputum production, shortness of breath and wheezing.   Cardiovascular: Negative for chest pain, palpitations, orthopnea and claudication.  Gastrointestinal: Negative for abdominal pain, blood in stool, constipation, diarrhea, heartburn, melena, nausea and vomiting.  Genitourinary: Negative for dysuria, flank pain, frequency, hematuria and urgency.  Musculoskeletal: Negative for back pain, joint pain and myalgias.  Skin: Negative for rash.  Neurological: Negative for dizziness, tingling, focal weakness, seizures, weakness and headaches.  Endo/Heme/Allergies: Does not bruise/bleed easily.  Psychiatric/Behavioral: Negative for depression and suicidal ideas. The patient does not have insomnia.       Allergies  Allergen Reactions  . Levaquin [Levofloxacin In D5w] Swelling and Rash     Past Medical History:  Diagnosis Date  . Atrial flutter ([place])    a. s/p ablation 09/2017.  . CKD (chronic kidney disease), stage III ([place])   . COPD (chronic obstructive pulmonary disease) ([place])   . Diabetes mellitus without complication ([place])   . Hyperlipidemia   . Hypertension   . Polycythemia      Past Surgical History:  Procedure Laterality Date  . A-FLUTTER ABLATION N/A 10/14/2017   Procedure: A-FLUTTER ABLATION;  Surgeon: Constance Haw, MD;  Location: [place] [facility];  Service: Cardiovascular;  Laterality: N/A;  . CARDIOVERSION N/A 09/08/2017   Procedure: CARDIOVERSION;  Surgeon: [name], MD;  Location: [place] [facility];  Service:  Cardiovascular;  Laterality: N/A;  . CARDIOVERSION N/A 03/22/2018   Procedure: CARDIOVERSION;  Surgeon: [name], MD;  Location: [facility];  Service: Cardiovascular;  Laterality: N/A;  . ELECTROPHYSIOLOGIC STUDY N/A 10/27/2016   Procedure: A-Flutter Ablation;  Surgeon: Deboraha Sprang, MD;  Location: [place] [facility];  Service: Cardiovascular;  Laterality: N/A;  . EUS N/A 12/22/2017   Procedure: FULL UPPER ENDOSCOPIC ULTRASOUND (EUS) RADIAL;  Surgeon: Reita Cliche, MD;  Location: [facility];  Service: Gastroenterology;  Laterality: N/A;  . TEE WITH CARDIOVERSION  12/16    Social History   Socioeconomic History  . Marital status: Widowed    Spouse name: Not on file  . Number of children: Not on file  . Years of education: Not on file  . Highest education level: Not on file  Occupational History  . Not on file  Social Needs  . Financial resource strain: Not on file  . Food insecurity:    Worry: Not on file    Inability: Not on file  . Transportation needs:    Medical: Not on file    Non-medical: Not on file  Tobacco Use  . Smoking status: Former Smoker    Packs/day: 1.50    Years: 45.00    Pack years: 67.50    Types: Cigarettes    Last attempt to quit: 11/27/2012    Years since quitting: 5.3  . Smokeless tobacco: Never Used  Substance and Sexual Activity  . Alcohol use: Yes    Alcohol/week: 1.2 oz    Types: 2 Cans of beer per week    Comment: beer every once a while once a month  . Drug use: No  . Sexual activity: Not on file  Lifestyle  . Physical activity:    Days per week: Not on file    Minutes per session: Not on file  .  Stress: Not on file  Relationships  . Social connections:    Talks on phone: Not on file    Gets together: Not on file    Attends religious service: Not on file    Active member of club or organization: Not on file    Attends meetings of clubs or organizations: Not on file    Relationship status: Not on file  . Intimate partner  violence:    Fear of current or ex partner: Not on file    Emotionally abused: Not on file    Physically abused: Not on file    Forced sexual activity: Not on file  Other Topics Concern  . Not on file  Social History Narrative  . Not on file    Family History  Problem Relation Age of Onset  . Hypertension Father      Current Outpatient Medications:  .  apixaban (ELIQUIS) 5 MG TABS tablet, Take 1 tablet (5 mg total) by mouth 2 (two) times daily., Disp: 60 tablet, Rfl: 11 .  budesonide-formoterol (SYMBICORT) 80-4.5 MCG/ACT inhaler, Inhale 1 puff into the lungs 2 times daily at 12 noon and 4 pm., Disp: 1 Inhaler, Rfl: 0 .  diltiazem (CARDIZEM CD) 120 MG 24 hr capsule, Take 1 capsule (120 mg total) by mouth 2 (two) times daily., Disp: 180 capsule, Rfl: 3 .  losartan (COZAAR) 25 MG tablet, Take 1 tablet (25 mg total) by mouth daily., Disp: 90 tablet, Rfl: 3 .  metFORMIN (GLUCOPHAGE) 1000 MG tablet, Take 1 tablet (1,000 mg total) by mouth 2 (two) times daily with a meal., Disp: 180 tablet, Rfl: 0 .  metoprolol succinate (TOPROL-XL) 50 MG 24 hr tablet, Take 1 tablet (50 mg total) by mouth every evening. Take with or immediately following a meal., Disp: 90 tablet, Rfl: 2 .  rosuvastatin (CRESTOR) 40 MG tablet, TAKE 1 TABLET BY MOUTH ONCE DAILY (Patient taking differently: Take 40 mg by mouth at bedtime), Disp: 90 tablet, Rfl: 2 .  vitamin B-12 (CYANOCOBALAMIN) 500 MCG tablet, Take 500 mcg by mouth daily., Disp: , Rfl:  .  acetaminophen (TYLENOL) 500 MG tablet, Take 500-1,000 mg by mouth every 6 (six) hours as needed for moderate pain or headache. , Disp: , Rfl:  .  guaiFENesin-codeine 100-10 MG/5ML syrup, Take 5 mLs by mouth every 4 (four) hours as needed for cough. (Patient not taking: Reported on 04/20/2018), Disp: 120 mL, Rfl: 0  Physical exam:  Vitals:   04/20/18 0952  BP: 102/65  Pulse: (!) 51  Resp: 18  Temp: (!) 97.5 F (36.4 C)  TempSrc: Tympanic  SpO2: 96%  Weight: 198 lb 11.2  oz (90.1 kg)  Height: 5\' 7"  (1.702 m)   Physical Exam  Constitutional: He is oriented to person, place, and time. He appears well-developed and well-nourished.  HENT:  Head: Normocephalic and atraumatic.  Eyes: Pupils are equal, round, and reactive to light. EOM are normal.  Neck: Normal range of motion.  Cardiovascular: Normal rate, regular rhythm and normal heart sounds.  Pulmonary/Chest: Effort normal and breath sounds normal.  Abdominal: Soft. Bowel sounds are normal.  Neurological: He is alert and oriented to person, place, and time.  Skin: Skin is warm and dry.     CMP Latest Ref Rng & Units 03/15/2018  Glucose 65 - 99 mg/dL 154(H)  BUN 6 - 20 mg/dL 32(H)  Creatinine 0.61 - 1.24 mg/dL 2.45(H)  Sodium 135 - 145 mmol/L 140  Potassium 3.5 - 5.1 mmol/L 3.9  Chloride 101 - 111 mmol/L 103  CO2 22 - 32 mmol/L 28  Calcium 8.9 - 10.3 mg/dL 9.0  Total Protein 6.5 - 8.1 g/dL -  Total Bilirubin 0.3 - 1.2 mg/dL -  Alkaline Phos 38 - 126 U/L -  AST 15 - 41 U/L -  ALT 17 - 63 U/L -   CBC Latest Ref Rng & Units 03/15/2018  WBC 3.8 - 10.6 K/uL 6.9  Hemoglobin 13.0 - 18.0 g/dL 13.5  Hematocrit 40.0 - 52.0 % 40.6  Platelets 150 - 440 K/uL 176    No images are attached to the encounter.  Ct Abdomen Pelvis Wo Contrast  Result Date: 04/12/2018 CLINICAL DATA:  History of neuroendocrine neoplasm. EXAM: CT ABDOMEN AND PELVIS WITHOUT CONTRAST TECHNIQUE: Multidetector CT imaging of the abdomen and pelvis was performed following the standard protocol without IV contrast. COMPARISON:  MRI 11/16/2017 and Dotatate PET-CT from 06/27/2017 FINDINGS: Lower chest: The lung bases are clear of acute process. No pleural effusion or pulmonary lesions. The heart is normal in size. No pericardial effusion. The distal esophagus and aorta are unremarkable. Hepatobiliary: No focal hepatic lesions or intrahepatic biliary dilatation. The gallbladder is normal. No common bile duct dilatation. Pancreas: Stable 2.0 x 1.7  cm mass in the lower pancreatic head. This also persistent focal atrophy in the head body junction region and mild dilatation of the main pancreatic duct in the body and tail region. Spleen: Normal size.  No focal lesions. Adrenals/Urinary Tract: Stable left adrenal gland nodules consistent with benign adenomas. Both kidneys are unremarkable. Mild uniform bladder wall thickening Stomach/Bowel: The stomach, duodenum, small bowel and colon are grossly normal without oral contrast. No inflammatory changes, mass lesions or obstructive findings. The terminal ileum and appendix are normal. Vascular/Lymphatic: Stable atherosclerotic calcifications involving the aorta and iliac arteries. No focal aneurysm. Stable calcified mesenteric mass consistent with known carcinoid tumor. There is also a stable 9 mm nodal lesion on image number 34. No new or progressive findings are identified. Reproductive: The prostate gland and seminal vesicles are unremarkable. Other: No pelvic mass or adenopathy. No free pelvic fluid collections. No inguinal mass or adenopathy. Small left inguinal hernia containing fat. No abdominal wall hernia or subcutaneous lesions. Musculoskeletal: No significant bony findings. IMPRESSION: 1. Stable partially calcified mesenteric mass and nearby mesenteric nodule consistent with known carcinoid tumor. No new/progressive findings. 2. Stable 2 cm lesion in the lower pancreatic head. 3. No findings on this noncontrast examination for hepatic metastatic disease. 4. Stable left adrenal gland adenomas. 5. Stable advanced atherosclerotic disease. Electronically Signed   By: Marijo Sanes M.D.   On: 04/12/2018 10:37     Assessment and plan- Patient is a 72 y.o. 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  Overall the size of the mesenteric lymph node as well as the central mesenteric mass and the pancreatic head mass has remained stable as compared to prior scans from February as well as a dotatate scan from November 2018.  Patient continues to be asymptomatic and does not have any abdominal pain cramping or diarrhea.  I am going to continue to monitor his carcinoid without any active treatment with octreotide/lanreotide at this time.  I will see him back in 6 months time with a repeat CBC CMP and chromogranin A and a repeat CT  abdomen and pelvis without contrast prior given his history of CKD     Visit Diagnosis 1. Neuroendocrine tumor of pancreas      Dr. Randa Evens, MD, MPH [facility] [phone] 04/20/2018 3:37 PM

## 2018-04-21 ENCOUNTER — Other Ambulatory Visit: Payer: PPO

## 2018-04-21 ENCOUNTER — Ambulatory Visit: Payer: PPO | Admitting: Oncology

## 2018-04-24 DIAGNOSIS — E1122 Type 2 diabetes mellitus with diabetic chronic kidney disease: Secondary | ICD-10-CM | POA: Diagnosis not present

## 2018-04-24 DIAGNOSIS — I129 Hypertensive chronic kidney disease with stage 1 through stage 4 chronic kidney disease, or unspecified chronic kidney disease: Secondary | ICD-10-CM | POA: Diagnosis not present

## 2018-04-24 DIAGNOSIS — N183 Chronic kidney disease, stage 3 (moderate): Secondary | ICD-10-CM | POA: Diagnosis not present

## 2018-04-24 DIAGNOSIS — N2581 Secondary hyperparathyroidism of renal origin: Secondary | ICD-10-CM | POA: Diagnosis not present

## 2018-04-24 DIAGNOSIS — R809 Proteinuria, unspecified: Secondary | ICD-10-CM | POA: Diagnosis not present

## 2018-04-24 DIAGNOSIS — R319 Hematuria, unspecified: Secondary | ICD-10-CM | POA: Diagnosis not present

## 2018-04-25 DIAGNOSIS — E781 Pure hyperglyceridemia: Secondary | ICD-10-CM | POA: Diagnosis not present

## 2018-04-25 DIAGNOSIS — N184 Chronic kidney disease, stage 4 (severe): Secondary | ICD-10-CM | POA: Diagnosis not present

## 2018-04-25 DIAGNOSIS — E538 Deficiency of other specified B group vitamins: Secondary | ICD-10-CM | POA: Diagnosis not present

## 2018-04-25 DIAGNOSIS — E785 Hyperlipidemia, unspecified: Secondary | ICD-10-CM | POA: Diagnosis not present

## 2018-04-25 DIAGNOSIS — E1165 Type 2 diabetes mellitus with hyperglycemia: Secondary | ICD-10-CM | POA: Diagnosis not present

## 2018-04-25 DIAGNOSIS — E279 Disorder of adrenal gland, unspecified: Secondary | ICD-10-CM | POA: Diagnosis not present

## 2018-04-25 DIAGNOSIS — E1122 Type 2 diabetes mellitus with diabetic chronic kidney disease: Secondary | ICD-10-CM | POA: Diagnosis not present

## 2018-04-28 DIAGNOSIS — I4892 Unspecified atrial flutter: Secondary | ICD-10-CM | POA: Diagnosis not present

## 2018-04-28 DIAGNOSIS — E785 Hyperlipidemia, unspecified: Secondary | ICD-10-CM | POA: Diagnosis not present

## 2018-04-28 DIAGNOSIS — E669 Obesity, unspecified: Secondary | ICD-10-CM | POA: Diagnosis not present

## 2018-04-28 DIAGNOSIS — E1129 Type 2 diabetes mellitus with other diabetic kidney complication: Secondary | ICD-10-CM | POA: Diagnosis not present

## 2018-05-03 ENCOUNTER — Other Ambulatory Visit: Payer: Self-pay | Admitting: Cardiovascular Disease

## 2018-05-26 DIAGNOSIS — E785 Hyperlipidemia, unspecified: Secondary | ICD-10-CM | POA: Diagnosis not present

## 2018-05-26 DIAGNOSIS — D471 Chronic myeloproliferative disease: Secondary | ICD-10-CM | POA: Diagnosis not present

## 2018-05-26 DIAGNOSIS — E1129 Type 2 diabetes mellitus with other diabetic kidney complication: Secondary | ICD-10-CM | POA: Diagnosis not present

## 2018-05-26 DIAGNOSIS — E669 Obesity, unspecified: Secondary | ICD-10-CM | POA: Diagnosis not present

## 2018-06-13 DIAGNOSIS — E669 Obesity, unspecified: Secondary | ICD-10-CM | POA: Diagnosis not present

## 2018-06-13 DIAGNOSIS — E785 Hyperlipidemia, unspecified: Secondary | ICD-10-CM | POA: Diagnosis not present

## 2018-06-13 DIAGNOSIS — E1129 Type 2 diabetes mellitus with other diabetic kidney complication: Secondary | ICD-10-CM | POA: Diagnosis not present

## 2018-06-13 DIAGNOSIS — Z0000 Encounter for general adult medical examination without abnormal findings: Secondary | ICD-10-CM | POA: Diagnosis not present

## 2018-06-13 DIAGNOSIS — E119 Type 2 diabetes mellitus without complications: Secondary | ICD-10-CM | POA: Diagnosis not present

## 2018-06-13 DIAGNOSIS — E034 Atrophy of thyroid (acquired): Secondary | ICD-10-CM | POA: Diagnosis not present

## 2018-06-13 DIAGNOSIS — D471 Chronic myeloproliferative disease: Secondary | ICD-10-CM | POA: Diagnosis not present

## 2018-06-27 DIAGNOSIS — N401 Enlarged prostate with lower urinary tract symptoms: Secondary | ICD-10-CM | POA: Diagnosis not present

## 2018-06-27 DIAGNOSIS — R350 Frequency of micturition: Secondary | ICD-10-CM | POA: Diagnosis not present

## 2018-06-27 DIAGNOSIS — R351 Nocturia: Secondary | ICD-10-CM | POA: Diagnosis not present

## 2018-06-27 DIAGNOSIS — Z125 Encounter for screening for malignant neoplasm of prostate: Secondary | ICD-10-CM | POA: Diagnosis not present

## 2018-08-22 ENCOUNTER — Ambulatory Visit: Payer: PPO | Admitting: Internal Medicine

## 2018-08-24 DIAGNOSIS — D3131 Benign neoplasm of right choroid: Secondary | ICD-10-CM | POA: Diagnosis not present

## 2018-08-28 DIAGNOSIS — I10 Essential (primary) hypertension: Secondary | ICD-10-CM | POA: Diagnosis not present

## 2018-08-28 DIAGNOSIS — E1129 Type 2 diabetes mellitus with other diabetic kidney complication: Secondary | ICD-10-CM | POA: Diagnosis not present

## 2018-08-28 DIAGNOSIS — R809 Proteinuria, unspecified: Secondary | ICD-10-CM | POA: Diagnosis not present

## 2018-08-28 DIAGNOSIS — N183 Chronic kidney disease, stage 3 (moderate): Secondary | ICD-10-CM | POA: Diagnosis not present

## 2018-08-31 DIAGNOSIS — I129 Hypertensive chronic kidney disease with stage 1 through stage 4 chronic kidney disease, or unspecified chronic kidney disease: Secondary | ICD-10-CM | POA: Diagnosis not present

## 2018-08-31 DIAGNOSIS — N184 Chronic kidney disease, stage 4 (severe): Secondary | ICD-10-CM | POA: Diagnosis not present

## 2018-08-31 DIAGNOSIS — N2581 Secondary hyperparathyroidism of renal origin: Secondary | ICD-10-CM | POA: Diagnosis not present

## 2018-08-31 DIAGNOSIS — R809 Proteinuria, unspecified: Secondary | ICD-10-CM | POA: Diagnosis not present

## 2018-08-31 DIAGNOSIS — E1122 Type 2 diabetes mellitus with diabetic chronic kidney disease: Secondary | ICD-10-CM | POA: Diagnosis not present

## 2018-09-06 ENCOUNTER — Ambulatory Visit: Payer: PPO | Admitting: Internal Medicine

## 2018-09-07 ENCOUNTER — Encounter: Payer: Self-pay | Admitting: Internal Medicine

## 2018-09-07 ENCOUNTER — Ambulatory Visit: Payer: PPO | Admitting: Internal Medicine

## 2018-09-07 VITALS — BP 126/70 | HR 87 | Resp 16 | Ht 67.0 in | Wt 198.0 lb

## 2018-09-07 DIAGNOSIS — J449 Chronic obstructive pulmonary disease, unspecified: Secondary | ICD-10-CM | POA: Diagnosis not present

## 2018-09-07 NOTE — Progress Notes (Signed)
MRN# [id number] [name] 12/27/1945   TO:IZTIWP up COPD  Brief History: HPI 01/03/15 -72 year old male with a past medical history significant for atrial flutter, COPD, hypertension referred by Dr.Golan for COPD optimization and preop pulmonary evaluation prior to scheduled cardioversion for atrial flutter one year ago   HPI Patient is here for follow-up COPD Shortness of breath and dyspnea on exertion is stable Patient doing well on his inhaler therapy Intermittent shortness of breath Uses oxygen at nighttime 2.5 L nasal cannula and benefits from oxygen therapy  No acute respiratory distress at this time No signs of infection No signs of heart failure  It seems that patient stated that he quit several weeks ago but at last office visit he quit 2 months ago so I do assume that patient is intermittently smoking   Smoking Assessment and Cessation Counseling   Upon further questioning, Patient smokes 2-3 cigs per day  I have advised patient to quit/stop smoking as soon as possible due to high risk for multiple medical problems  Patient is willing to quit smoking   I have advised patient that we can assist and have options of Nicotine replacement therapy. I also advised patient on behavioral therapy and can provide oral medication therapy in conjunction with the other therapies  Follow up next Office visit  for assessment of smoking cessation  Smoking cessation counseling advised for 4 minutes         Medication:    Current Outpatient Medications:  .  acetaminophen (TYLENOL) 500 MG tablet, Take 500-1,000 mg by mouth every 6 (six) hours as needed for moderate pain or headache. , Disp: , Rfl:  .  albuterol (PROAIR HFA) 108 (90 Base) MCG/ACT inhaler, Inhale 2 puffs into the lungs every 6 (six) hours as needed for wheezing or shortness of breath., Disp: , Rfl:  .  apixaban (ELIQUIS) 5 MG TABS tablet, Take 1 tablet (5 mg total) by mouth 2 (two) times daily., Disp: 60  tablet, Rfl: 11 .  budesonide-formoterol (SYMBICORT) 80-4.5 MCG/ACT inhaler, Inhale 1 puff into the lungs 2 times daily at 12 noon and 4 pm., Disp: 1 Inhaler, Rfl: 0 .  diltiazem (CARDIZEM CD) 180 MG 24 hr capsule, Take 180 mg by mouth daily., Disp: , Rfl:  .  guaiFENesin-codeine 100-10 MG/5ML syrup, Take 5 mLs by mouth every 4 (four) hours as needed for cough., Disp: 120 mL, Rfl: 0 .  JANUMET XR 50-500 MG TB24, Take 1 tablet by mouth daily., Disp: , Rfl: 7 .  levothyroxine (SYNTHROID, LEVOTHROID) 50 MCG tablet, Take 50 mcg by mouth daily., Disp: , Rfl:  .  losartan (COZAAR) 25 MG tablet, Take 1 tablet (25 mg total) by mouth daily., Disp: 90 tablet, Rfl: 3 .  metoprolol succinate (TOPROL-XL) 50 MG 24 hr tablet, Take 1 tablet (50 mg total) by mouth every evening. Take with or immediately following a meal., Disp: 90 tablet, Rfl: 2 .  rosuvastatin (CRESTOR) 40 MG tablet, TAKE 1 TABLET BY MOUTH ONCE DAILY, Disp: 90 tablet, Rfl: 2 .  Tiotropium Bromide Monohydrate (SPIRIVA RESPIMAT) 1.25 MCG/ACT AERS, Inhale 2 puffs into the lungs daily., Disp: , Rfl:  .  vitamin B-12 (CYANOCOBALAMIN) 500 MCG tablet, Take 500 mcg by mouth daily., Disp: , Rfl: \      Review of Systems:  Gen:  Denies  fever, sweats, chills weigh loss  HEENT: Denies blurred vision, double vision, ear pain, eye pain, hearing loss, nose bleeds, sore throat Cardiac:  No dizziness, chest pain  or heaviness, chest tightness,edema, No JVD Resp:    +shortness of breath Gi: Denies swallowing difficulty, stomach pain, nausea or vomiting, diarrhea, constipation, bowel incontinence Gu:  Denies bladder incontinence, burning urine Ext:   Denies Joint pain, stiffness or swelling Skin: Denies  skin rash, easy bruising or bleeding or hives Endoc:  Denies polyuria, polydipsia , polyphagia or weight change Psych:   Denies depression, insomnia or hallucinations  Other:  All other systems negative    Allergies:  Levaquin [levofloxacin in  d5w]  Physical Examination:  BP 126/70 (BP Location: Left Arm, Cuff Size: Normal)   Pulse 87   Resp 16   Ht 5\' 7"  (1.702 m)   Wt 198 lb (89.8 kg)   SpO2 91%   BMI 31.01 kg/m   Physical Examination:   GENERAL:NAD, no fevers, chills, no weakness no fatigue HEAD: Normocephalic, atraumatic.  EYES: Pupils equal, round, reactive to light. Extraocular muscles intact. No scleral icterus.  MOUTH: Moist mucosal membrane. Dentition intact. No abscess noted.  EAR, NOSE, THROAT: Clear without exudates. No external lesions.  NECK: Supple. No thyromegaly. No nodules. No JVD.  PULMONARY: CTA B/L no wheezing, rhonchi, crackles CARDIOVASCULAR: S1 and S2. Regular rate and rhythm. No murmurs, rubs, or gallops. No edema. Pedal pulses 2+ bilaterally.  GASTROINTESTINAL: Soft, nontender, nondistended. No masses. Positive bowel sounds. No hepatosplenomegaly.  MUSCULOSKELETAL: No swelling, clubbing, or edema. Range of motion full in all extremities.  NEUROLOGIC: Cranial nerves II through XII are intact. No gross focal neurological deficits. Sensation intact. Reflexes intact.  SKIN: No ulceration, lesions, rashes, or cyanosis. Skin warm and dry. Turgor intact.  PSYCHIATRIC: Mood, affect within normal limits. The patient is awake, alert and oriented x 3. Insight, judgment intact.  ALL OTHER ROS ARE NEGATIVE       Pulmonary function testing 05/07/2015 FEV1 62% FEV1/FVC 57% RV 136 TLC 93 RV/TLC 144 DLCO uncorrected 67% Impression: Moderate obstruction with no significant broncho-dilator response. Moderate decrease in DLCO.  Coronary function testing 08/18/2016 FEV1 40% FEV1/FVC 58% RV 170 TLC 95 RV/TLC 175 DLCO 58 Impression: Moderate-severe obstruction with minor bronchodilator response, 14% change in FVC, moderate decrease in DLCO 6MWT - total distance 1181 feet/360 m, low saturation 92%, highest heart rate 64; dyspnea score at the end of test is 0.     Assessment and Plan:   72 year old  pleasant male with a history of smoking and tobacco abuse seen today for follow-up of moderate COPD Gold stage B with chronic hypoxic respiratory failure from underlying COPD   #1 COPD moderate Gold stage B Seems to be doing well on his current regimen of Symbicort daily and Spiriva as needed Continue albuterol as needed  #2 based on review review of last pulmonary function testing in October 2017 shows an FEV1 of 48% predicted which has declined from an FEV1 of 60% predicted in 2016  #3 shortness of breath and dyspnea on exertion multifactorial related to COPD and deconditioned state   #4 chronic hypoxic respiratory failure From COPD Patient uses and benefits oxygen therapy 2.5 L nasal cannula at night Patient needs this for survival Continue oxygen as prescribed  #5 will assess CT lung cancer screening at next office   #6 smoking cessation counseling provided  follow up in 6 months  Patient satisfied with Plan of action and management. All questions answered  Corrin Parker, M.D.  [facility]  Medical Director [place] Director [facility] Cardio-Pulmonary Department

## 2018-09-07 NOTE — Patient Instructions (Signed)
Continue inhalers as prescribed. Flu shot up to date.

## 2018-09-24 NOTE — Progress Notes (Signed)
Cardiology Office Note  Date:  09/26/2018   ID:  Siddhartha, Hoback 12/03/1945, MRN [id number]  PCP:  [name], MD   Chief Complaint  Patient presents with  . OTHER    6 month f/u no complaints today. Meds reviewed verbally with pt.    HPI:  Mr. Kann is a 72 year old gentleman with   hypertension,  diabetes,  COPD, long smoking history, polycythemia,   atrial flutter with heart rate 140s up to 150. started on beta blockers, diltiazem, digoxin, eventually started on amiodarone infusion with anticoagulation, eliquis. TEE and cardioversion on 10/30/2014. 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.4 metastatic small bowel carcinoid Presents today for follow-up of his atrial flutter  No sx, still in atrial flutter Does not want cardioversion or repeat ablation Reports he is active, no significant shortness of breath, chest pain, weight gain, abdominal bloating Continues on Symbicort Did not start smoking again, stopped over one year ago  Has a part-time job, delivers mail  Lab work reviewed with him in detail showing creatinine 2.43 in October 2019 potassium 4.6  Successful cardioversion 03/22/2018 in follow-up had atrial flutter  Has a CT scan scheduled December 2019 to evaluate small bowel carcinoid  Prior to cardioversion rate was 130 atrial flutter Possibly exacerbated by COPD exacerbation, improved on antibiotics  EKG personally reviewed by myself on todays visit Shows atrial flutter ventricular rate ranging between 80 and 90 bpm, PVCs noted, poor R wave progression  Other past medical history reviewed 10/11/2017, was in atrial flutter Electrophysiology study and radiofrequency catheter ablation on  10/14/17 by Dr Curt Bears. Seen by Dr. Caryl Comes 11/01/2017:  NSR eliquis and cardizem stopped Now back in atrial flutter  Back in atrial flutter 02/2017 In setting of upper respiratory tract infection  rate 122 bpm when he was seen by pulmonary one month earlier Having no sx Restarted  digoxin one a day , eliquis 5 mg twice a day, diltiazem 180 mg once a day On April 26 had converted back to normal sinus rhythm  clinic September 06, 2017 noted to have atrial flutter Cardioversion 09/08/17 for atrial flutter  Heart rate 120 bpm when seen by oncology November 21 Presenting in clinic today in atrial flutter rate 120 bpm  Previous cardioversion on 01/07/2015 which successfully restored normal sinus rhythm  TEE report showed ejection fraction 45-50%, right and left atrium mildly enlarged, RV mildly dilated with mildly reduced systolic function, small amount of plaque in the aorta  CT scan of the chest 10/29/2014 in the hospital showed bilateral predominantly lower lobe pulmonary opacity most consistent with pneumonia   PMH:   has a past medical history of Atrial flutter ([place]), CKD (chronic kidney disease), stage III ([place]), COPD (chronic obstructive pulmonary disease) ([place]), Diabetes mellitus without complication ([place]), Hyperlipidemia, Hypertension, and Polycythemia.  PSH:    Past Surgical History:  Procedure Laterality Date  . A-FLUTTER ABLATION N/A 10/14/2017   Procedure: A-FLUTTER ABLATION;  Surgeon: Constance Haw, MD;  Location: [place] [facility];  Service: Cardiovascular;  Laterality: N/A;  . CARDIOVERSION N/A 09/08/2017   Procedure: CARDIOVERSION;  Surgeon: [name], MD;  Location: [facility];  Service: Cardiovascular;  Laterality: N/A;  . CARDIOVERSION N/A 03/22/2018   Procedure: CARDIOVERSION;  Surgeon: [name], MD;  Location: [facility];  Service: Cardiovascular;  Laterality: N/A;  . ELECTROPHYSIOLOGIC STUDY N/A 10/27/2016   Procedure: A-Flutter Ablation;   Surgeon: Deboraha Sprang, MD;  Location: [place] [facility];  Service: Cardiovascular;  Laterality: N/A;  . EUS N/A 12/22/2017   Procedure: FULL UPPER ENDOSCOPIC ULTRASOUND (EUS) RADIAL;  Surgeon: Reita Cliche, MD;  Location: [facility];  Service: Gastroenterology;  Laterality: N/A;  . TEE WITH CARDIOVERSION  12/16    Current Outpatient Medications  Medication Sig Dispense Refill  . acetaminophen (TYLENOL) 500 MG tablet Take 500-1,000 mg by mouth every 6 (six) hours as needed for moderate pain or headache.     . albuterol (PROAIR HFA) 108 (90 Base) MCG/ACT inhaler Inhale 2 puffs into the lungs every 6 (six) hours as needed for wheezing or shortness of breath.    Marland Kitchen apixaban (ELIQUIS) 5 MG TABS tablet Take 1 tablet (5 mg total) by mouth 2 (two) times daily. 60 tablet 11  . budesonide-formoterol (SYMBICORT) 80-4.5 MCG/ACT inhaler Inhale 1 puff into the lungs 2 times daily at 12 noon and 4 pm. 1 Inhaler 0  . diltiazem (CARDIZEM CD) 180 MG 24 hr capsule Take 180 mg by mouth daily.    Marland Kitchen guaiFENesin-codeine 100-10 MG/5ML syrup Take 5 mLs by mouth every 4 (four) hours as needed for cough. 120 mL 0  . JANUMET XR 50-500 MG TB24 Take 1 tablet by mouth daily.  7  . levothyroxine (SYNTHROID, LEVOTHROID) 50 MCG tablet Take 50 mcg by mouth daily.    Marland Kitchen losartan (COZAAR) 25 MG tablet Take 1 tablet (25 mg total) by mouth daily. 90 tablet 3  . metoprolol succinate (TOPROL-XL) 50 MG 24 hr tablet Take 1 tablet (50 mg total) by mouth every evening. Take with or immediately following a meal. 90 tablet 2  . rosuvastatin (CRESTOR) 40 MG tablet TAKE 1 TABLET BY MOUTH ONCE DAILY 90 tablet 2  . Tiotropium Bromide Monohydrate (SPIRIVA RESPIMAT) 1.25 MCG/ACT AERS Inhale 2 puffs into the lungs daily.    . vitamin B-12 (CYANOCOBALAMIN) 500 MCG tablet Take 500 mcg by mouth daily.     No current facility-administered medications for this visit.      Allergies:   Levaquin [levofloxacin in d5w]   Social History:  The  patient  reports that he quit smoking about 5 years ago. His smoking use included cigarettes. He has a 67.50 pack-year smoking history. He has never used smokeless tobacco. He reports that he drinks about 2.0 standard drinks of alcohol per week. He reports that he does not use drugs.   Family History:   family history includes Hypertension in his father.    Review of Systems: Review of Systems  Constitutional: Negative.   Respiratory: Positive for shortness of breath.   Cardiovascular: Negative.   Gastrointestinal: Negative.   Musculoskeletal: Negative.   Neurological: Negative.   Psychiatric/Behavioral: Negative.   All other systems reviewed and are negative.    PHYSICAL EXAM: VS:  BP 121/66 (BP Location: Left Arm, Patient Position: Sitting, Cuff Size: Normal)   Pulse 82   Ht 5\' 7"  (1.702 m)   Wt 195 lb 8 oz (88.7 kg)   BMI 30.62 kg/m  , BMI Body mass index is 30.62 kg/m.  Constitutional:  oriented to person, place, and time. No distress.  HENT:  Head: Grossly normal Eyes:  no discharge. No scleral icterus.  Neck: No JVD, no carotid bruits  Cardiovascular: Regular rate and rhythm, no murmurs appreciated Pulmonary/Chest: Moderately decreased breath  sounds throughout,  no wheezes or rails Abdominal: Soft.  no distension.  no tenderness.  Musculoskeletal: Normal range of motion Neurological:  normal muscle tone. Coordination normal. No atrophy Skin: Skin warm and dry Psychiatric: normal affect, pleasant   Recent Labs: 01/06/2018: ALT 22 03/15/2018: BUN 32; Creatinine, Ser 2.45; Hemoglobin 13.5; Platelets 176; Potassium 3.9; Sodium 140    Lipid Panel No results found for: CHOL, HDL, LDLCALC, TRIG    Wt Readings from Last 3 Encounters:  09/26/18 195 lb 8 oz (88.7 kg)  09/07/18 198 lb (89.8 kg)  04/20/18 198 lb 11.2 oz (90.1 kg)      ASSESSMENT AND PLAN:   Essential hypertension -  Blood pressure is well controlled on today's visit. No changes made to the  medications.  Coronary artery disease involving native coronary artery of native heart with angina pectoris (HCC) -  Stable chronic mild shortness of breath secondary to COPD No change in clinical status We have not ordered ischemic work-up at this time  Typical atrial flutter ([place]) - Plan: EKG 12-Lead Case previously discussed with Dr. Caryl Comes , Dr. Curt Bears Previous ablation,x2,  not holding normal sinus rhythm He is relatively asymptomatic, Tolerating anticoagulation, no further plans for restoring normal sinus rhythm Rate well controlled  Centrilobular emphysema ([place]) Stopped smoking many years ago, maintained on his inhalers No recent COPD exacerbation  Uncontrolled type 2 diabetes mellitus with other circulatory complication, without long-term current use of insulin ([place]) With complications including chronic renal insufficiency, creatinine 2.4 Stressed the importance of aggressive diabetes control   Total encounter time more than 25 minutes  Greater than 50% was spent in counseling and coordination of care with the patient    Orders Placed This Encounter  Procedures  . EKG 12-Lead     Signed, Esmond Plants, M.D., Ph.D. 09/26/2018  [place], [place]

## 2018-09-26 ENCOUNTER — Encounter: Payer: Self-pay | Admitting: Cardiovascular Disease

## 2018-09-26 ENCOUNTER — Ambulatory Visit (INDEPENDENT_AMBULATORY_CARE_PROVIDER_SITE_OTHER): Payer: PPO | Admitting: Cardiovascular Disease

## 2018-09-26 VITALS — BP 121/66 | HR 82 | Ht 67.0 in | Wt 195.5 lb

## 2018-09-26 DIAGNOSIS — I483 Typical atrial flutter: Secondary | ICD-10-CM

## 2018-09-26 DIAGNOSIS — E1165 Type 2 diabetes mellitus with hyperglycemia: Secondary | ICD-10-CM | POA: Diagnosis not present

## 2018-09-26 DIAGNOSIS — J432 Centrilobular emphysema: Secondary | ICD-10-CM | POA: Diagnosis not present

## 2018-09-26 NOTE — Patient Instructions (Signed)

## 2018-10-18 ENCOUNTER — Ambulatory Visit
Admission: RE | Admit: 2018-10-18 | Discharge: 2018-10-18 | Disposition: A | Discharge status: Home / Self Care | Payer: PPO | Source: Ambulatory Visit | Attending: Oncology | Admitting: Oncology

## 2018-10-18 DIAGNOSIS — D3A8 Other benign neuroendocrine tumors: Secondary | ICD-10-CM | POA: Diagnosis not present

## 2018-10-18 DIAGNOSIS — E279 Disorder of adrenal gland, unspecified: Secondary | ICD-10-CM | POA: Diagnosis not present

## 2018-10-20 ENCOUNTER — Other Ambulatory Visit: Payer: PPO

## 2018-10-20 ENCOUNTER — Ambulatory Visit: Payer: PPO | Admitting: Oncology

## 2018-10-26 DIAGNOSIS — N184 Chronic kidney disease, stage 4 (severe): Secondary | ICD-10-CM | POA: Diagnosis not present

## 2018-10-26 DIAGNOSIS — E1165 Type 2 diabetes mellitus with hyperglycemia: Secondary | ICD-10-CM | POA: Diagnosis not present

## 2018-10-26 DIAGNOSIS — E781 Pure hyperglyceridemia: Secondary | ICD-10-CM | POA: Diagnosis not present

## 2018-10-26 DIAGNOSIS — E538 Deficiency of other specified B group vitamins: Secondary | ICD-10-CM | POA: Diagnosis not present

## 2018-10-26 DIAGNOSIS — E1122 Type 2 diabetes mellitus with diabetic chronic kidney disease: Secondary | ICD-10-CM | POA: Diagnosis not present

## 2018-10-27 ENCOUNTER — Encounter: Payer: Self-pay | Admitting: Oncology

## 2018-10-27 ENCOUNTER — Inpatient Hospital Stay: Payer: PPO | Attending: Oncology

## 2018-10-27 ENCOUNTER — Inpatient Hospital Stay (HOSPITAL_BASED_OUTPATIENT_CLINIC_OR_DEPARTMENT_OTHER): Payer: PPO | Admitting: Oncology

## 2018-10-27 VITALS — BP 107/65 | HR 88 | Temp 97.9°F | Resp 18 | Ht 67.0 in | Wt 192.5 lb

## 2018-10-27 DIAGNOSIS — I10 Essential (primary) hypertension: Secondary | ICD-10-CM | POA: Diagnosis not present

## 2018-10-27 DIAGNOSIS — N183 Chronic kidney disease, stage 3 (moderate): Secondary | ICD-10-CM | POA: Diagnosis not present

## 2018-10-27 DIAGNOSIS — Z7901 Long term (current) use of anticoagulants: Secondary | ICD-10-CM | POA: Diagnosis not present

## 2018-10-27 DIAGNOSIS — C7B04 Secondary carcinoid tumors of peritoneum: Secondary | ICD-10-CM | POA: Diagnosis not present

## 2018-10-27 DIAGNOSIS — Z08 Encounter for follow-up examination after completed treatment for malignant neoplasm: Secondary | ICD-10-CM

## 2018-10-27 DIAGNOSIS — I4892 Unspecified atrial flutter: Secondary | ICD-10-CM

## 2018-10-27 DIAGNOSIS — Z79899 Other long term (current) drug therapy: Secondary | ICD-10-CM

## 2018-10-27 DIAGNOSIS — E119 Type 2 diabetes mellitus without complications: Secondary | ICD-10-CM | POA: Diagnosis not present

## 2018-10-27 DIAGNOSIS — Z87891 Personal history of nicotine dependence: Secondary | ICD-10-CM

## 2018-10-27 DIAGNOSIS — D3A8 Other benign neuroendocrine tumors: Secondary | ICD-10-CM

## 2018-10-27 DIAGNOSIS — C7A019 Malignant carcinoid tumor of the small intestine, unspecified portion: Secondary | ICD-10-CM | POA: Insufficient documentation

## 2018-10-27 DIAGNOSIS — C7B00 Secondary carcinoid tumors, unspecified site: Secondary | ICD-10-CM

## 2018-10-27 DIAGNOSIS — Z859 Personal history of malignant neoplasm, unspecified: Secondary | ICD-10-CM

## 2018-10-27 LAB — COMPREHENSIVE METABOLIC PANEL
ALK PHOS: 79 U/L (ref 38–126)
ALT: 12 U/L (ref 0–44)
ANION GAP: 8 (ref 5–15)
AST: 13 U/L — ABNORMAL LOW (ref 15–41)
Albumin: 3.7 g/dL (ref 3.5–5.0)
BILIRUBIN TOTAL: 0.6 mg/dL (ref 0.3–1.2)
BUN: 30 mg/dL — ABNORMAL HIGH (ref 8–23)
CALCIUM: 9.5 mg/dL (ref 8.9–10.3)
CO2: 28 mmol/L (ref 22–32)
CREATININE: 2.54 mg/dL — AB (ref 0.61–1.24)
Chloride: 98 mmol/L (ref 98–111)
GFR calc Af Amer: 28 mL/min — ABNORMAL LOW (ref >60)
GFR, EST NON AFRICAN AMERICAN: 24 mL/min — AB (ref >60)
Glucose, Bld: 306 mg/dL — ABNORMAL HIGH (ref 70–99)
Potassium: 4.8 mmol/L (ref 3.5–5.1)
SODIUM: 134 mmol/L — AB (ref 135–145)
TOTAL PROTEIN: 6.5 g/dL (ref 6.5–8.1)

## 2018-10-27 LAB — CBC WITH DIFFERENTIAL/PLATELET
Abs Immature Granulocytes: 0.54 10*3/uL — ABNORMAL HIGH (ref 0.00–0.07)
BASOS PCT: 1 %
Basophils Absolute: 0.1 10*3/uL (ref 0.0–0.1)
EOS ABS: 0.3 10*3/uL (ref 0.0–0.5)
EOS PCT: 3 %
HEMATOCRIT: 46.8 % (ref 39.0–52.0)
Hemoglobin: 15 g/dL (ref 13.0–17.0)
IMMATURE GRANULOCYTES: 6 %
LYMPHS ABS: 1 10*3/uL (ref 0.7–4.0)
Lymphocytes Relative: 11 %
MCH: 31.2 pg (ref 26.0–34.0)
MCHC: 32.1 g/dL (ref 30.0–36.0)
MCV: 97.3 fL (ref 80.0–100.0)
Monocytes Absolute: 1 10*3/uL (ref 0.1–1.0)
Monocytes Relative: 11 %
Neutro Abs: 6.3 10*3/uL (ref 1.7–7.7)
Neutrophils Relative %: 68 %
PLATELETS: 142 10*3/uL — AB (ref 150–400)
RBC: 4.81 MIL/uL (ref 4.22–5.81)
RDW: 11.5 % (ref 11.5–15.5)
Smear Review: NORMAL
WBC: 9.3 10*3/uL (ref 4.0–10.5)
nRBC: 0 % (ref 0.0–0.2)

## 2018-10-27 NOTE — Progress Notes (Signed)
No new changes noted today 

## 2018-10-30 NOTE — Progress Notes (Signed)
Hematology/Oncology Consult note [facility]  [phone] [phone]  Patient Care Team: [name], MD as PCP - General (Cardiology) [name], MD as Consulting Physician (Cardiology) Clent Jacks, RN as Registered Nurse   Name of the patient: [name]  [id number]  [birth year]   Date of visit: 10/30/18  Diagnosis- metastatic small bowel carcinoid  Chief complaint/ Reason for visit-routine follow-up for small bowel carcinoid  Heme/Onc history: Patient is a 72 year old male who was seen by me for immature cells noted inperipheral blood.Results of blood work from 12/03/2016 was as follows: CBC showed white count of 7.6, H&H of 16.2/46.9 and a platelet count of 137. Differential at that time was normal and no immature cells were noted. Review of peripheral blood smear showed mild thrombocytopenia. Blasts are not identified. Peripheral flow cytometry did not reveal any significant diagnostic immunophenotypic abnormality. EPO level was normal at 8.1.JAK2 testing was not completed because of insufficient sample. BCR abl testing done on 10/27/2016 did not reveal any evidence of 9:22 translocation suggestive of CML.  Patient was asked to f/u with PCP.   He underwent CT abdomen on 9/18for decreased kidney function and hematuria. CT showed:Abnormal partially calcified soft tissue density in the ileocolic mesentery could represent a carcinoid metastasis. Differential considerations include the sequelae of prior mesenteric adenitis/panniculitis. Given traversing and superior mesenteric artery branches on the prior exam from 2012, aneurysm is possible but felt less likely. Consider further evaluation with octreotide scintigraphy. 3. L5 vague sclerotic lesion is not readily apparent on the prior exam. This may represent a bone island. If the patient is eventually diagnosed with a malignancy, osseous metastasis would be a  concern. 4. Possible soft tissue fullness within the pancreatic body and tail with equivocal peripancreatic edema. Correlate with signs/symptoms of pancreatitis.  Dotatatescan in November 2018 showed:IMPRESSION: 1. 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 2. Single nodal metastasis at the root of the mesenteric vessels. 3. Focal activity within the the small bowel of the lower abdomen midline without CT correlation. This could represent a primary neuroendocrine tumor neoplasm. Consider CT enterography for further evaluation. 4. No abnormal radiotracer accumulation within the pancreas.   MRI abdomen and pelvis with and without contrast in January 2019 showed:IMPRESSION: 1.8 x 2.0 cm enhancing mass in the right mid abdominal mesentery, hypermetabolic on dotatate PET, consistent with well-differentiated neuroendocrine tumor metastasis.  Additional 9 mm nodal metastasis in the central abdominal mesentery.  2.2 x 1.7 cm mass in the pancreatic head, without hypermetabolism on prior dotatate PET. Associated segmental atrophy the pancreatic body. Dilatation of the main pancreatic duct in the pancreatic tail. These findings are worrisome for pancreatic neoplasm, but are less suggestive of pancreatic adenocarcinoma or well-differentiated neuroendocrine tumor. EUS is suggested. 2.3 cm left adrenal adenoma, benign.  This was followed by EUS and FNA of the pancreatic head mass which showed:DIAGNOSIS:  A. PANCREATIC HEAD MASS; ENDOSCOPIC ULTRASOUND GUIDED FNA:  - POSITIVE FOR MALIGNANCY.  - NON-DUCTAL NEOPLASM PRESENT, SEE COMMENT.Based on cytologic features differential diagnosis includes neuroendocrine tumor and asthenic cell carcinoma. Case sent to [facility] for second opinion who also favored the diagnosis of neuroendocrine tumor noting that acinar cell carcinoma cannot be excluded  Interval  history-he is feeling well overall.  His appetite is good and he denies any unintentional weight loss.  Denies any abdominal cramping or diarrhea.  ECOG PS- 1 Pain scale- 0 Opioid associated constipation- no  Review of  systems- Review of Systems  Constitutional: Positive for malaise/fatigue. Negative for chills, fever and weight loss.  HENT: Negative for congestion, ear discharge and nosebleeds.   Eyes: Negative for blurred vision.  Respiratory: Negative for cough, hemoptysis, sputum production, shortness of breath and wheezing.   Cardiovascular: Negative for chest pain, palpitations, orthopnea and claudication.  Gastrointestinal: Negative for abdominal pain, blood in stool, constipation, diarrhea, heartburn, melena, nausea and vomiting.  Genitourinary: Negative for dysuria, flank pain, frequency, hematuria and urgency.  Musculoskeletal: Negative for back pain, joint pain and myalgias.  Skin: Negative for rash.  Neurological: Negative for dizziness, tingling, focal weakness, seizures, weakness and headaches.  Endo/Heme/Allergies: Does not bruise/bleed easily.  Psychiatric/Behavioral: Negative for depression and suicidal ideas. The patient does not have insomnia.       Allergies  Allergen Reactions  . Levaquin [Levofloxacin In D5w] Swelling and Rash     Past Medical History:  Diagnosis Date  . Atrial flutter ([place])    a. s/p ablation 09/2017.  . CKD (chronic kidney disease), stage III ([place])   . COPD (chronic obstructive pulmonary disease) ([place])   . Diabetes mellitus without complication ([place])   . Hyperlipidemia   . Hypertension   . Polycythemia      Past Surgical History:  Procedure Laterality Date  . A-FLUTTER ABLATION N/A 10/14/2017   Procedure: A-FLUTTER ABLATION;  Surgeon: Constance Haw, MD;  Location: [place] [facility];  Service: Cardiovascular;  Laterality: N/A;  . CARDIOVERSION N/A 09/08/2017   Procedure: CARDIOVERSION;  Surgeon: [name], MD;   Location: [place] [facility];  Service: Cardiovascular;  Laterality: N/A;  . CARDIOVERSION N/A 03/22/2018   Procedure: CARDIOVERSION;  Surgeon: [name], MD;  Location: [facility];  Service: Cardiovascular;  Laterality: N/A;  . ELECTROPHYSIOLOGIC STUDY N/A 10/27/2016   Procedure: A-Flutter Ablation;  Surgeon: Deboraha Sprang, MD;  Location: [place] [facility];  Service: Cardiovascular;  Laterality: N/A;  . EUS N/A 12/22/2017   Procedure: FULL UPPER ENDOSCOPIC ULTRASOUND (EUS) RADIAL;  Surgeon: Reita Cliche, MD;  Location: [facility];  Service: Gastroenterology;  Laterality: N/A;  . TEE WITH CARDIOVERSION  12/16    Social History   Socioeconomic History  . Marital status: Widowed    Spouse name: Not on file  . Number of children: Not on file  . Years of education: Not on file  . Highest education level: Not on file  Occupational History  . Not on file  Social Needs  . Financial resource strain: Not on file  . Food insecurity:    Worry: Not on file    Inability: Not on file  . Transportation needs:    Medical: Not on file    Non-medical: Not on file  Tobacco Use  . Smoking status: Former Smoker    Packs/day: 1.50    Years: 45.00    Pack years: 67.50    Types: Cigarettes    Last attempt to quit: 11/27/2012    Years since quitting: 5.9  . Smokeless tobacco: Never Used  Substance and Sexual Activity  . Alcohol use: Yes    Alcohol/week: 2.0 standard drinks    Types: 2 Cans of beer per week    Comment: beer every once a while once a month  . Drug use: No  . Sexual activity: Not on file  Lifestyle  . Physical activity:    Days per week: Not on file    Minutes per session: Not on file  . Stress: Not on file  Relationships  . Social connections:    Talks on phone: Not on file    Gets together: Not on file    Attends religious service: Not on file    Active member of club or organization: Not on file    Attends meetings of clubs or organizations: Not on file     Relationship status: Not on file  . Intimate partner violence:    Fear of current or ex partner: Not on file    Emotionally abused: Not on file    Physically abused: Not on file    Forced sexual activity: Not on file  Other Topics Concern  . Not on file  Social History Narrative  . Not on file    Family History  Problem Relation Age of Onset  . Hypertension Father      Current Outpatient Medications:  .  apixaban (ELIQUIS) 5 MG TABS tablet, Take 1 tablet (5 mg total) by mouth 2 (two) times daily., Disp: 60 tablet, Rfl: 11 .  budesonide-formoterol (SYMBICORT) 80-4.5 MCG/ACT inhaler, Inhale 1 puff into the lungs 2 times daily at 12 noon and 4 pm., Disp: 1 Inhaler, Rfl: 0 .  diltiazem (CARDIZEM CD) 180 MG 24 hr capsule, Take 180 mg by mouth daily., Disp: , Rfl:  .  guaiFENesin-codeine 100-10 MG/5ML syrup, Take 5 mLs by mouth every 4 (four) hours as needed for cough., Disp: 120 mL, Rfl: 0 .  JANUMET XR 50-500 MG TB24, Take 1 tablet by mouth daily., Disp: , Rfl: 7 .  levothyroxine (SYNTHROID, LEVOTHROID) 50 MCG tablet, Take 50 mcg by mouth daily., Disp: , Rfl:  .  losartan (COZAAR) 25 MG tablet, Take 1 tablet (25 mg total) by mouth daily., Disp: 90 tablet, Rfl: 3 .  metoprolol succinate (TOPROL-XL) 50 MG 24 hr tablet, Take 1 tablet (50 mg total) by mouth every evening. Take with or immediately following a meal., Disp: 90 tablet, Rfl: 2 .  rosuvastatin (CRESTOR) 40 MG tablet, TAKE 1 TABLET BY MOUTH ONCE DAILY, Disp: 90 tablet, Rfl: 2 .  Tiotropium Bromide Monohydrate (SPIRIVA RESPIMAT) 1.25 MCG/ACT AERS, Inhale 2 puffs into the lungs daily., Disp: , Rfl:  .  vitamin B-12 (CYANOCOBALAMIN) 500 MCG tablet, Take 500 mcg by mouth daily., Disp: , Rfl:  .  acetaminophen (TYLENOL) 500 MG tablet, Take 500-1,000 mg by mouth every 6 (six) hours as needed for moderate pain or headache. , Disp: , Rfl:  .  albuterol (PROAIR HFA) 108 (90 Base) MCG/ACT inhaler, Inhale 2 puffs into the lungs every 6 (six)  hours as needed for wheezing or shortness of breath., Disp: , Rfl:   Physical exam:  Vitals:   10/27/18 0928 10/27/18 0931  BP: 107/65   Pulse: 88   Resp: 18   Temp: 97.9 F (36.6 C)   TempSrc: Tympanic   SpO2: (!) 89% 90%  Weight: 192 lb 8 oz (87.3 kg)   Height: 5\' 7"  (1.702 m)    Physical Exam Constitutional:      General: He is not in acute distress. HENT:     Head: Normocephalic and atraumatic.  Eyes:     Pupils: Pupils are equal, round, and reactive to light.  Neck:     Musculoskeletal: Normal range of motion.  Cardiovascular:     Rate and Rhythm: Normal rate.     Heart sounds: Normal heart sounds.     Comments: Heart rate irregular Pulmonary:     Effort: Pulmonary effort is normal.  Breath sounds: Normal breath sounds.  Abdominal:     General: Bowel sounds are normal.     Palpations: Abdomen is soft.     Tenderness: There is no abdominal tenderness.  Skin:    General: Skin is warm and dry.  Neurological:     Mental Status: He is alert and oriented to person, place, and time.      CMP Latest Ref Rng & Units 10/27/2018  Glucose 70 - 99 mg/dL 306(H)  BUN 8 - 23 mg/dL 30(H)  Creatinine 0.61 - 1.24 mg/dL 2.54(H)  Sodium 135 - 145 mmol/L 134(L)  Potassium 3.5 - 5.1 mmol/L 4.8  Chloride 98 - 111 mmol/L 98  CO2 22 - 32 mmol/L 28  Calcium 8.9 - 10.3 mg/dL 9.5  Total Protein 6.5 - 8.1 g/dL 6.5  Total Bilirubin 0.3 - 1.2 mg/dL 0.6  Alkaline Phos 38 - 126 U/L 79  AST 15 - 41 U/L 13(L)  ALT 0 - 44 U/L 12   CBC Latest Ref Rng & Units 10/27/2018  WBC 4.0 - 10.5 K/uL 9.3  Hemoglobin 13.0 - 17.0 g/dL 15.0  Hematocrit 39.0 - 52.0 % 46.8  Platelets 150 - 400 K/uL 142(L)    No images are attached to the encounter.  Ct Abdomen Pelvis Wo Contrast  Result Date: 10/18/2018 CLINICAL DATA:  Pancreas lesions.  Constipation.  Carcinoid. EXAM: CT ABDOMEN AND PELVIS WITHOUT CONTRAST TECHNIQUE: Multidetector CT imaging of the abdomen and pelvis was performed following  the standard protocol without IV contrast. COMPARISON:  04/12/2018. FINDINGS: Lower chest: Lung bases show no acute findings. Heart size normal. Coronary artery calcification. No pericardial or pleural effusion. Distal esophagus is grossly unremarkable. Hepatobiliary: Liver margin is slightly irregular inferiorly with a recanalized paraumbilical vein. Liver and gallbladder are otherwise unremarkable. No biliary ductal dilatation. Pancreas: Pancreatic duct measures up to approximately 7 mm. There is haziness about the body and proximal tail of the pancreas, similar. Pancreatic head mass measures approximately 1.9 x 2.1 cm, similar. Spleen: Negative. Adrenals/Urinary Tract: Right adrenal gland is unremarkable. Fluid density nodule in the body of the left adrenal gland measures 2.7 cm, unchanged. Kidneys are unremarkable. Ureters are decompressed. Bladder is low in volume. Stomach/Bowel: Stomach and small bowel are unremarkable. Appendix is not readily visualized. Stool is seen throughout the colon, indicative of constipation. Vascular/Lymphatic: Atherosclerotic calcification of the arterial vasculature without aneurysm. High mesenteric lymph node, inferior to the pancreas, measures 10 mm (series 2, image 34). Otherwise, no pathologically enlarged lymph nodes. Reproductive: Prostate is visualized. Other: No free fluid. Calcified central small bowel mesenteric lesion measures 1.8 x 2.5 cm, stable. Mesenteries and peritoneum are otherwise unremarkable. Small left inguinal hernia contains fat. Musculoskeletal: Sclerotic lesion in the left iliac wing is likely bone island. Degenerative changes in the spine. IMPRESSION: 1. Calcified mesenteric nodule and adjacent lymph node are stable and consistent with the given history of carcinoid. 2. Pancreatic head mass and associated ductal obstruction, stable. 3. Marginal irregularity of the inferior liver and recanalized paraumbilical vein, findings indicative of cirrhosis. 4. Left  adrenal adenoma. 5. Aortic atherosclerosis (ICD10-170.0). Coronary artery calcification. Electronically Signed   By: Lorin Picket M.D.   On: 10/18/2018 14:25     Assessment and plan- Patient is a 72 y.o. 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  He is here to discuss the results of the CT scan  I have reviewed CT abdomen images  independently and discussed findings with the patient.  Overall his pancreatic head mass (which is a known neuroendocrine tumor) along with adjacent calcified mesenteric nodule and lymph nodes are essentially stable.  There is no new evidence of disease progression or metastases.  We will continue to observe his tumor at this time without initiating octreotide at this time.  I will see him back in 6 months time with repeat CT chest abdomen and pelvis without contrast given his chronic kidney disease.  We will touch base with Dr. Rebecka Apley regarding findings of cirrhosis as well as his mildly worsening creatinine on today's labs  Visit Diagnosis 1. Metastatic carcinoid tumor ([place])   2. Encounter for follow-up surveillance of malignant carcinoid tumor      Dr. Randa Evens, MD, MPH [facility] [phone] 10/30/2018 8:45 AM

## 2018-10-31 LAB — CHROMOGRANIN A: Chromogranin A: 6 nmol/L — ABNORMAL HIGH (ref 0–5)

## 2018-11-02 DIAGNOSIS — E781 Pure hyperglyceridemia: Secondary | ICD-10-CM | POA: Diagnosis not present

## 2018-11-02 DIAGNOSIS — E279 Disorder of adrenal gland, unspecified: Secondary | ICD-10-CM | POA: Diagnosis not present

## 2018-11-02 DIAGNOSIS — N184 Chronic kidney disease, stage 4 (severe): Secondary | ICD-10-CM | POA: Diagnosis not present

## 2018-11-02 DIAGNOSIS — E1165 Type 2 diabetes mellitus with hyperglycemia: Secondary | ICD-10-CM | POA: Diagnosis not present

## 2018-11-02 DIAGNOSIS — E785 Hyperlipidemia, unspecified: Secondary | ICD-10-CM | POA: Diagnosis not present

## 2018-11-02 DIAGNOSIS — E1122 Type 2 diabetes mellitus with diabetic chronic kidney disease: Secondary | ICD-10-CM | POA: Diagnosis not present

## 2018-11-25 ENCOUNTER — Emergency Department: Payer: PPO

## 2018-11-25 ENCOUNTER — Encounter: Payer: Self-pay | Admitting: Emergency Medicine

## 2018-11-25 ENCOUNTER — Other Ambulatory Visit: Payer: Self-pay

## 2018-11-25 ENCOUNTER — Inpatient Hospital Stay
Admission: EM | Admit: 2018-11-25 | Discharge: 2018-11-28 | DRG: 439 | Disposition: A | Discharge status: Home / Self Care | Payer: PPO | Attending: Specialist | Admitting: Specialist

## 2018-11-25 DIAGNOSIS — E1122 Type 2 diabetes mellitus with diabetic chronic kidney disease: Secondary | ICD-10-CM | POA: Diagnosis present

## 2018-11-25 DIAGNOSIS — I4891 Unspecified atrial fibrillation: Secondary | ICD-10-CM | POA: Diagnosis present

## 2018-11-25 DIAGNOSIS — Z7984 Long term (current) use of oral hypoglycemic drugs: Secondary | ICD-10-CM

## 2018-11-25 DIAGNOSIS — R1013 Epigastric pain: Secondary | ICD-10-CM | POA: Diagnosis not present

## 2018-11-25 DIAGNOSIS — R933 Abnormal findings on diagnostic imaging of other parts of digestive tract: Secondary | ICD-10-CM | POA: Diagnosis not present

## 2018-11-25 DIAGNOSIS — I4892 Unspecified atrial flutter: Secondary | ICD-10-CM | POA: Diagnosis not present

## 2018-11-25 DIAGNOSIS — C259 Malignant neoplasm of pancreas, unspecified: Secondary | ICD-10-CM | POA: Diagnosis not present

## 2018-11-25 DIAGNOSIS — K8689 Other specified diseases of pancreas: Secondary | ICD-10-CM | POA: Diagnosis not present

## 2018-11-25 DIAGNOSIS — Z881 Allergy status to other antibiotic agents status: Secondary | ICD-10-CM | POA: Diagnosis not present

## 2018-11-25 DIAGNOSIS — Z87891 Personal history of nicotine dependence: Secondary | ICD-10-CM

## 2018-11-25 DIAGNOSIS — E785 Hyperlipidemia, unspecified: Secondary | ICD-10-CM | POA: Diagnosis not present

## 2018-11-25 DIAGNOSIS — Z79899 Other long term (current) drug therapy: Secondary | ICD-10-CM

## 2018-11-25 DIAGNOSIS — C7A8 Other malignant neuroendocrine tumors: Secondary | ICD-10-CM | POA: Diagnosis present

## 2018-11-25 DIAGNOSIS — N184 Chronic kidney disease, stage 4 (severe): Secondary | ICD-10-CM | POA: Diagnosis not present

## 2018-11-25 DIAGNOSIS — C786 Secondary malignant neoplasm of retroperitoneum and peritoneum: Secondary | ICD-10-CM | POA: Diagnosis not present

## 2018-11-25 DIAGNOSIS — Z7901 Long term (current) use of anticoagulants: Secondary | ICD-10-CM | POA: Diagnosis not present

## 2018-11-25 DIAGNOSIS — R109 Unspecified abdominal pain: Secondary | ICD-10-CM | POA: Diagnosis not present

## 2018-11-25 DIAGNOSIS — J449 Chronic obstructive pulmonary disease, unspecified: Secondary | ICD-10-CM | POA: Diagnosis not present

## 2018-11-25 DIAGNOSIS — Z7951 Long term (current) use of inhaled steroids: Secondary | ICD-10-CM | POA: Diagnosis not present

## 2018-11-25 DIAGNOSIS — E1165 Type 2 diabetes mellitus with hyperglycemia: Secondary | ICD-10-CM

## 2018-11-25 DIAGNOSIS — K859 Acute pancreatitis without necrosis or infection, unspecified: Secondary | ICD-10-CM | POA: Diagnosis not present

## 2018-11-25 DIAGNOSIS — E039 Hypothyroidism, unspecified: Secondary | ICD-10-CM | POA: Diagnosis not present

## 2018-11-25 DIAGNOSIS — I10 Essential (primary) hypertension: Secondary | ICD-10-CM | POA: Diagnosis not present

## 2018-11-25 DIAGNOSIS — I129 Hypertensive chronic kidney disease with stage 1 through stage 4 chronic kidney disease, or unspecified chronic kidney disease: Secondary | ICD-10-CM | POA: Diagnosis not present

## 2018-11-25 DIAGNOSIS — E119 Type 2 diabetes mellitus without complications: Secondary | ICD-10-CM | POA: Diagnosis not present

## 2018-11-25 LAB — COMPREHENSIVE METABOLIC PANEL
ALBUMIN: 4.1 g/dL (ref 3.5–5.0)
ALK PHOS: 82 U/L (ref 38–126)
ALT: 11 U/L (ref 0–44)
ANION GAP: 13 (ref 5–15)
AST: 13 U/L — AB (ref 15–41)
BUN: 30 mg/dL — ABNORMAL HIGH (ref 8–23)
CALCIUM: 9.2 mg/dL (ref 8.9–10.3)
CO2: 23 mmol/L (ref 22–32)
CREATININE: 2.37 mg/dL — AB (ref 0.61–1.24)
Chloride: 103 mmol/L (ref 98–111)
GFR, EST AFRICAN AMERICAN: 30 mL/min — AB (ref >60)
GFR, EST NON AFRICAN AMERICAN: 26 mL/min — AB (ref >60)
Glucose, Bld: 145 mg/dL — ABNORMAL HIGH (ref 70–99)
Potassium: 4 mmol/L (ref 3.5–5.1)
SODIUM: 139 mmol/L (ref 135–145)
Total Bilirubin: 0.8 mg/dL (ref 0.3–1.2)
Total Protein: 7 g/dL (ref 6.5–8.1)

## 2018-11-25 LAB — URINALYSIS, COMPLETE (UACMP) WITH MICROSCOPIC
Bilirubin Urine: NEGATIVE
GLUCOSE, UA: NEGATIVE mg/dL
Ketones, ur: NEGATIVE mg/dL
NITRITE: NEGATIVE
Protein, ur: 100 mg/dL — AB
SPECIFIC GRAVITY, URINE: 1.021 (ref 1.005–1.030)
pH: 5 (ref 5.0–8.0)

## 2018-11-25 LAB — CBC
HCT: 50.9 % (ref 39.0–52.0)
Hemoglobin: 15.9 g/dL (ref 13.0–17.0)
MCH: 31.7 pg (ref 26.0–34.0)
MCHC: 31.2 g/dL (ref 30.0–36.0)
MCV: 101.4 fL — ABNORMAL HIGH (ref 80.0–100.0)
NRBC: 0 % (ref 0.0–0.2)
PLATELETS: 142 10*3/uL — AB (ref 150–400)
RBC: 5.02 MIL/uL (ref 4.22–5.81)
RDW: 11.3 % — AB (ref 11.5–15.5)
WBC: 8.2 10*3/uL (ref 4.0–10.5)

## 2018-11-25 LAB — GLUCOSE, CAPILLARY: Glucose-Capillary: 92 mg/dL (ref 70–99)

## 2018-11-25 LAB — LIPASE, BLOOD: LIPASE: 473 U/L — AB (ref 11–51)

## 2018-11-25 LAB — TROPONIN I

## 2018-11-25 MED ORDER — ONDANSETRON HCL 4 MG PO TABS: 4.0000 mg | ORAL_TABLET | Freq: Four times a day (QID) | ORAL | Status: DC | PRN

## 2018-11-25 MED ORDER — ACETAMINOPHEN 325 MG PO TABS
650.0000 mg | ORAL_TABLET | Freq: Four times a day (QID) | ORAL | Status: DC | PRN
  Administered 2018-11-25 – 2018-11-26 (×3): 650 mg via ORAL
  Filled 2018-11-25 (×3): qty 2

## 2018-11-25 MED ORDER — LEVOTHYROXINE SODIUM 50 MCG PO TABS
50.0000 ug | ORAL_TABLET | Freq: Every day | ORAL | Status: DC
  Administered 2018-11-26 – 2018-11-28 (×3): 50 ug via ORAL
  Filled 2018-11-25 (×3): qty 1

## 2018-11-25 MED ORDER — MOMETASONE FURO-FORMOTEROL FUM 100-5 MCG/ACT IN AERO
2.0000 | INHALATION_SPRAY | Freq: Two times a day (BID) | RESPIRATORY_TRACT | Status: DC
  Administered 2018-11-26 – 2018-11-28 (×5): 2 via RESPIRATORY_TRACT
  Filled 2018-11-25: qty 8.8

## 2018-11-25 MED ORDER — METOPROLOL SUCCINATE ER 50 MG PO TB24
50.0000 mg | ORAL_TABLET | Freq: Every evening | ORAL | Status: DC
  Administered 2018-11-25 – 2018-11-27 (×3): 50 mg via ORAL
  Filled 2018-11-25 (×3): qty 1

## 2018-11-25 MED ORDER — HYDROCODONE-ACETAMINOPHEN 5-325 MG PO TABS: 1.0000 | ORAL_TABLET | ORAL | Status: DC | PRN

## 2018-11-25 MED ORDER — DILTIAZEM HCL ER COATED BEADS 180 MG PO CP24
180.0000 mg | ORAL_CAPSULE | Freq: Every day | ORAL | Status: DC
  Administered 2018-11-26 – 2018-11-28 (×3): 180 mg via ORAL
  Filled 2018-11-25 (×4): qty 1

## 2018-11-25 MED ORDER — INSULIN ASPART 100 UNIT/ML ~~LOC~~ SOLN: 0.0000 [IU] | Freq: Three times a day (TID) | SUBCUTANEOUS | Status: DC

## 2018-11-25 MED ORDER — ALBUTEROL SULFATE (2.5 MG/3ML) 0.083% IN NEBU: 3.0000 mL | INHALATION_SOLUTION | RESPIRATORY_TRACT | Status: DC | PRN

## 2018-11-25 MED ORDER — INSULIN ASPART 100 UNIT/ML ~~LOC~~ SOLN: 0.0000 [IU] | Freq: Four times a day (QID) | SUBCUTANEOUS | Status: DC

## 2018-11-25 MED ORDER — ROSUVASTATIN CALCIUM 20 MG PO TABS
40.0000 mg | ORAL_TABLET | Freq: Every day | ORAL | Status: DC
  Administered 2018-11-25 – 2018-11-28 (×4): 40 mg via ORAL
  Filled 2018-11-25 (×4): qty 2

## 2018-11-25 MED ORDER — ACETAMINOPHEN 650 MG RE SUPP: 650.0000 mg | Freq: Four times a day (QID) | RECTAL | Status: DC | PRN

## 2018-11-25 MED ORDER — ONDANSETRON HCL 4 MG/2ML IJ SOLN: 4.0000 mg | Freq: Four times a day (QID) | INTRAMUSCULAR | Status: DC | PRN

## 2018-11-25 MED ORDER — TIOTROPIUM BROMIDE MONOHYDRATE 18 MCG IN CAPS
1.0000 | ORAL_CAPSULE | Freq: Every day | RESPIRATORY_TRACT | Status: DC
  Administered 2018-11-26 – 2018-11-28 (×3): 18 ug via RESPIRATORY_TRACT
  Filled 2018-11-25: qty 5

## 2018-11-25 MED ORDER — SODIUM CHLORIDE 0.9 % IV SOLN
INTRAVENOUS | Status: DC
  Administered 2018-11-25 – 2018-11-27 (×3): via INTRAVENOUS

## 2018-11-25 MED ORDER — SODIUM CHLORIDE 0.9 % IV BOLUS
1000.0000 mL | Freq: Once | INTRAVENOUS | Status: AC
  Administered 2018-11-25: 1000 mL via INTRAVENOUS

## 2018-11-25 MED ORDER — LOSARTAN POTASSIUM 25 MG PO TABS
25.0000 mg | ORAL_TABLET | Freq: Every day | ORAL | Status: DC
  Administered 2018-11-26 – 2018-11-28 (×3): 25 mg via ORAL
  Filled 2018-11-25 (×3): qty 1

## 2018-11-25 MED ORDER — APIXABAN 5 MG PO TABS
5.0000 mg | ORAL_TABLET | Freq: Two times a day (BID) | ORAL | Status: DC
  Administered 2018-11-25 – 2018-11-28 (×6): 5 mg via ORAL
  Filled 2018-11-25 (×6): qty 1

## 2018-11-25 MED ORDER — VITAMIN B-12 1000 MCG PO TABS
500.0000 ug | ORAL_TABLET | Freq: Every day | ORAL | Status: DC
  Administered 2018-11-26 – 2018-11-28 (×3): 500 ug via ORAL
  Filled 2018-11-25 (×3): qty 1

## 2018-11-25 MED ORDER — MORPHINE SULFATE (PF) 2 MG/ML IV SOLN: 2.0000 mg | INTRAVENOUS | Status: DC | PRN

## 2018-11-25 NOTE — H&P (Addendum)
[facility] [place] NAME: [name]    MR#:  [id number]  DATE OF BIRTH:  01/13/1946  DATE OF ADMISSION:  11/25/2018  PRIMARY CARE PHYSICIAN: [name], MD   REQUESTING/REFERRING PHYSICIAN: Joni Fears MD  CHIEF COMPLAINT:   Chief Complaint  Patient presents with  . Abdominal Pain    HISTORY OF PRESENT ILLNESS: [name]  is a 73 y.o. male with a known history of metastatic carcinoid tumor, atrial flutter, chronic kidney disease stage III, COPD, diabetes, diet hypertension, hyperlipidemia and polycythemia who is presenting with abdominal pain ongoing for the last 2 days.  Describes it as a sharp type abdominal pain with radiation to the back.  He has not had any nausea vomiting or diarrhea.  Denies any fevers or chills.   PAST MEDICAL HISTORY:   Past Medical History:  Diagnosis Date  . Atrial flutter ([place])    a. s/p ablation 09/2017.  . CKD (chronic kidney disease), stage III ([place])   . COPD (chronic obstructive pulmonary disease) ([place])   . Diabetes mellitus without complication ([place])   . Hyperlipidemia   . Hypertension   . Polycythemia     PAST SURGICAL HISTORY:  Past Surgical History:  Procedure Laterality Date  . A-FLUTTER ABLATION N/A 10/14/2017   Procedure: A-FLUTTER ABLATION;  Surgeon: Constance Haw, MD;  Location: [facility];  Service: Cardiovascular;  Laterality: N/A;  . CARDIOVERSION N/A 09/08/2017   Procedure: CARDIOVERSION;  Surgeon: [name], MD;  Location: [facility];  Service: Cardiovascular;  Laterality: N/A;  . CARDIOVERSION N/A 03/22/2018   Procedure: CARDIOVERSION;  Surgeon: [name], MD;  Location: [facility];  Service: Cardiovascular;  Laterality: N/A;  . ELECTROPHYSIOLOGIC STUDY N/A 10/27/2016   Procedure: A-Flutter Ablation;  Surgeon: Deboraha Sprang, MD;  Location: [facility];  Service: Cardiovascular;  Laterality: N/A;  . EUS N/A 12/22/2017   Procedure: FULL UPPER  ENDOSCOPIC ULTRASOUND (EUS) RADIAL;  Surgeon: Reita Cliche, MD;  Location: [facility];  Service: Gastroenterology;  Laterality: N/A;  . TEE WITH CARDIOVERSION  12/16    SOCIAL HISTORY:  Social History   Tobacco Use  . Smoking status: Former Smoker    Packs/day: 1.50    Years: 45.00    Pack years: 67.50    Types: Cigarettes    Last attempt to quit: 11/27/2012    Years since quitting: 5.9  . Smokeless tobacco: Never Used  Substance Use Topics  . Alcohol use: Yes    Alcohol/week: 2.0 standard drinks    Types: 2 Cans of beer per week    Comment: beer every once a while once a month    FAMILY HISTORY:  Family History  Problem Relation Age of Onset  . Hypertension Father     DRUG ALLERGIES:  Allergies  Allergen Reactions  . Levaquin [Levofloxacin In D5w] Swelling and Rash    REVIEW OF SYSTEMS:   CONSTITUTIONAL: No fever, fatigue or weakness.  EYES: No blurred or double vision.  EARS, NOSE, AND THROAT: No tinnitus or ear pain.  RESPIRATORY: No cough, shortness of breath, wheezing or hemoptysis.  CARDIOVASCULAR: No chest pain, orthopnea, edema.  GASTROINTESTINAL: No nausea, vomiting, diarrhea or positive abdominal pain.  GENITOURINARY: No dysuria, hematuria.  ENDOCRINE: No polyuria, nocturia,  HEMATOLOGY: No anemia, easy bruising or bleeding SKIN: No rash or lesion. MUSCULOSKELETAL: No joint pain or arthritis.   NEUROLOGIC: No tingling, numbness, weakness.  PSYCHIATRY: No anxiety or depression.  MEDICATIONS AT HOME:  Prior to Admission medications   Medication Sig Start Date End Date Taking? Authorizing Provider  acetaminophen (TYLENOL) 500 MG tablet Take 500-1,000 mg by mouth every 6 (six) hours as needed for moderate pain or headache.     [provider]  albuterol (PROAIR HFA) 108 (90 Base) MCG/ACT inhaler Inhale 2 puffs into the lungs every 6 (six) hours as needed for wheezing or shortness of breath.    [provider]  apixaban (ELIQUIS)  5 MG TABS tablet Take 1 tablet (5 mg total) by mouth 2 (two) times daily. 02/07/18   [name], MD  budesonide-formoterol (SYMBICORT) 80-4.5 MCG/ACT inhaler Inhale 1 puff into the lungs 2 times daily at 12 noon and 4 pm. 04/05/17   Flora Lipps, MD  diltiazem (CARDIZEM CD) 180 MG 24 hr capsule Take 180 mg by mouth daily.    [provider]  guaiFENesin-codeine 100-10 MG/5ML syrup Take 5 mLs by mouth every 4 (four) hours as needed for cough. 02/07/18   Flora Lipps, MD  JANUMET XR 50-500 MG TB24 Take 1 tablet by mouth daily. 07/28/18   [provider]  levothyroxine (SYNTHROID, LEVOTHROID) 50 MCG tablet Take 50 mcg by mouth daily. 06/13/18   [provider]  losartan (COZAAR) 25 MG tablet Take 1 tablet (25 mg total) by mouth daily. 03/27/18   [name], MD  metoprolol succinate (TOPROL-XL) 50 MG 24 hr tablet Take 1 tablet (50 mg total) by mouth every evening. Take with or immediately following a meal. 02/15/18   Gollan, Kathlene November, MD  rosuvastatin (CRESTOR) 40 MG tablet TAKE 1 TABLET BY MOUTH ONCE DAILY 05/03/18   [name], MD  Tiotropium Bromide Monohydrate (SPIRIVA RESPIMAT) 1.25 MCG/ACT AERS Inhale 2 puffs into the lungs daily.    [provider]  vitamin B-12 (CYANOCOBALAMIN) 500 MCG tablet Take 500 mcg by mouth daily.    [provider]      PHYSICAL EXAMINATION:   VITAL SIGNS: Blood pressure (!) 142/84, pulse (!) 127, temperature 98.1 F (36.7 C), temperature source Oral, resp. rate 16, height 5\' 7"  (1.702 m), weight 85.3 kg, SpO2 93 %.  GENERAL:  73 y.o.-year-old patient lying in the bed with no acute distress.  EYES: Pupils equal, round, reactive to light and accommodation. No scleral icterus. Extraocular muscles intact.  HEENT: Head atraumatic, normocephalic. Oropharynx and nasopharynx clear.  NECK:  Supple, no jugular venous distention. No thyroid enlargement, no tenderness.  LUNGS: Normal breath sounds bilaterally, no  wheezing, rales,rhonchi or crepitation. No use of accessory muscles of respiration.  CARDIOVASCULAR: S1, S2 normal. No murmurs, rubs, or gallops.  ABDOMEN: Soft, epigastric tenderness nondistended. Bowel sounds present. No organomegaly or mass.  EXTREMITIES: No pedal edema, cyanosis, or clubbing.  NEUROLOGIC: Cranial nerves II through XII are intact. Muscle strength 5/5 in all extremities. Sensation intact. Gait not checked.  PSYCHIATRIC: The patient is alert and oriented x 3.  SKIN: No obvious rash, lesion, or ulcer.   LABORATORY PANEL:   CBC Recent Labs  Lab 11/25/18 1719  WBC 8.2  HGB 15.9  HCT 50.9  PLT 142*  MCV 101.4*  MCH 31.7  MCHC 31.2  RDW 11.3*   ------------------------------------------------------------------------------------------------------------------  Chemistries  Recent Labs  Lab 11/25/18 1719  NA 139  K 4.0  CL 103  CO2 23  GLUCOSE 145*  BUN 30*  CREATININE 2.37*  CALCIUM 9.2  AST 13*  ALT 11  ALKPHOS 82  BILITOT 0.8   ------------------------------------------------------------------------------------------------------------------  estimated creatinine clearance is 28.5 mL/min (A) (by C-G formula based on SCr of 2.37 mg/dL (H)). ------------------------------------------------------------------------------------------------------------------ No results for input(s): TSH, T4TOTAL, T3FREE, THYROIDAB in the last 72 hours.  Invalid input(s): FREET3   Coagulation profile No results for input(s): INR, PROTIME in the last 168 hours. ------------------------------------------------------------------------------------------------------------------- No results for input(s): DDIMER in the last 72 hours. -------------------------------------------------------------------------------------------------------------------  Cardiac Enzymes Recent Labs  Lab 11/25/18 1719  TROPONINI <0.03    ------------------------------------------------------------------------------------------------------------------ Invalid input(s): POCBNP  ---------------------------------------------------------------------------------------------------------------  Urinalysis    Component Value Date/Time   COLORURINE YELLOW (A) 11/25/2018 1719   APPEARANCEUR CLOUDY (A) 11/25/2018 1719   APPEARANCEUR Clear 10/29/2014 0909   LABSPEC 1.021 11/25/2018 1719   LABSPEC 1.017 10/29/2014 0909   PHURINE 5.0 11/25/2018 1719   GLUCOSEU NEGATIVE 11/25/2018 1719   GLUCOSEU Negative 10/29/2014 0909   HGBUR MODERATE (A) 11/25/2018 1719   BILIRUBINUR NEGATIVE 11/25/2018 1719   BILIRUBINUR Negative 10/29/2014 0909   KETONESUR NEGATIVE 11/25/2018 1719   PROTEINUR 100 (A) 11/25/2018 1719   NITRITE NEGATIVE 11/25/2018 1719   LEUKOCYTESUR TRACE (A) 11/25/2018 1719   LEUKOCYTESUR Negative 10/29/2014 0909     RADIOLOGY: Ct Abdomen Pelvis Wo Contrast  Result Date: 11/25/2018 CLINICAL DATA:  Pt c/o abdominal pain to epigastric area that radiates to lower abdomen straight down center of abdomen. Also c/o back pain but describes as all over back. Unsure if chest and back pain are related as pt having difficulty describing both. Has had vomiting. No bowel movement in 2-3 days EXAM: CT ABDOMEN AND PELVIS WITHOUT CONTRAST TECHNIQUE: Multidetector CT imaging of the abdomen and pelvis was performed following the standard protocol without IV contrast. COMPARISON:  10/18/2018 and older exams. FINDINGS: Lower chest: Clear lung bases. Hepatobiliary: Liver is unremarkable. Possible small stones in the gallbladder neck. No gallbladder wall thickening. No bile duct dilation. Pancreas: Mass head of the pancreas measuring 2.1 x 1.7 cm, without significant change prior CT, as well as an MRI dated 11/16/2017. There is peripancreatic inflammation. The superior head through the tail of the pancreas is mildly thickened with an indistinct  margin. Pancreatic duct is irregularly dilated. No pancreatic mass. No fluid collection is seen to suggest a pseudocyst. Spleen: Normal in size without focal abnormality. Adrenals/Urinary Tract: 2.1 cm left adrenal adenoma, stable. Normal right adrenal gland. Kidneys normal in size, orientation and position. No masses, collecting system stones or hydronephrosis. Normal ureters. Bladder is decompressed but otherwise unremarkable. Stomach/Bowel: Lobulated, partly calcified mesenteric mass in the right mid abdomen measures 2.5 x 1.9 x 1.3 cm, unchanged from the prior CT. Stomach is unremarkable. Small bowel and colon are normal in caliber. No wall thickening or inflammation. No evidence of appendicitis. Vascular/Lymphatic: Aortic atherosclerotic calcifications. No aneurysm. There are prominent and enlarged peripancreatic lymph nodes, largest measuring 1.4 cm in short axis, between the pancreatic head and duodenum. Reproductive: Unremarkable. Other: No abdominal wall hernia.  No ascites. Musculoskeletal: No fracture or acute finding. No osteoblastic or osteolytic lesions. 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 IMPRESSION: 1. Findings consistent with acute pancreatitis. 2. 2.1 cm head of the pancreas mass is stable from the prior exam and abdomen MRI, 11/16/2017. 3. Partly calcified mesenteric mass is also stable. 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 4. Peripancreatic adenopathy, similar to the prior CT. 5. Left adrenal adenoma, stable. 6. Aortic atherosclerosis. Electronically Signed   By: Lajean Manes M.D.   On: 11/25/2018 18:13    EKG: Orders placed or  performed during the hospital encounter of 11/25/18  . ED EKG  . ED EKG  . EKG 12-Lead  . EKG 12-Lead  . EKG 12-Lead  . EKG 12-Lead    IMPRESSION AND PLAN: Patient 73 year old presenting with abdominal pain  1.  Acute pancreatitis etiology unclear patient states that he  only rarely drinks Has a history of pancreatic mass apparently that is benign. I will check a lipid panel in the morning Give IV fluid Recheck lipase in the morning If no improvement consider GI input and oncology input  2.  Atrial fibrillation with intermittently elevated heart rate Will monitor on telemetry Continue Eliquis, continue rate controlling medications once medications are reviewed   3.  Diabetes type 2 Place on sliding scale insulin Hold Janumet  4.  Hypothyroidism continue Synthroid  5.  Hyperlipidemia continue Crestor  6.  Carcinoid tumor outpatient follow-up with oncology     All the records are reviewed and case discussed with ED provider. Management plans discussed with the patient, family and they are in agreement.  CODE STATUS: Code Status History    Date Active Date Inactive Code Status Order ID Comments User Context   10/14/2017 1310 10/15/2017 1714 Full Code [id number]  Constance Haw, MD Inpatient   10/27/2016 1138 10/28/2016 1429 Full Code [id number]  Deboraha Sprang, MD Inpatient       TOTAL TIME TAKING CARE OF THIS PATIENT:55 minutes.    Dustin Flock M.D on 11/25/2018 at 9:31 PM  Between 7am to 6pm - Pager - [phone]  After 6pm go to [email or web address] - password [email or web address]  [phone]  CC: Primary care physician; [name], MD

## 2018-11-25 NOTE — ED Notes (Signed)
Patient transported to CT 

## 2018-11-25 NOTE — ED Triage Notes (Signed)
Pt c/o abdominal pain to epigastric area that radiates to lower abdomen straight down center of abdomen. Also c/o back pain but describes as all over back.  Unsure if chest and back pain are related as pt having difficulty describing both. Has had vomiting. No bowel movement in 2-3 days.

## 2018-11-25 NOTE — ED Notes (Signed)
This RN attempted to call report. Secretary informed this RN that April, RN would be taking report but she was currently in a pt's room. Secretary informed this RN that April, RN would call back as soon as she was finished in pt's room to take report.

## 2018-11-25 NOTE — Progress Notes (Signed)
Advanced care plan.  Purpose of the Encounter: CODE STATUS  Parties in Attendance: Patient himself  Patient's Decision Capacity: Intact  Subjective/Patient's story: [name]  is a 73 y.o. male with a known history of metastatic carcinoid tumor, atrial flutter, chronic kidney disease stage III, COPD, diabetes, diet hypertension, hyperlipidemia and polycythemia who is presenting with abdominal pain.  Patient's evaluation shows he is got acute pancreatitis   Objective/Medical story  I discussed with the patient regarding his desires for cardiac and pulmonary resuscitation  Goals of care determination:   Patient states that he would like to be a full code CODE STATUS:  Full code  Time spent discussing advanced care planning: 16 minutes

## 2018-11-25 NOTE — ED Provider Notes (Signed)
[facility] Emergency Department Provider Note  ____________________________________________  Time seen: Approximately 7:23 PM  I have reviewed the triage vital signs and the nursing notes.   HISTORY  Chief Complaint Abdominal Pain    HPI Keston Seever is a 73 y.o. male with a history of atrial flutter, CKD, COPD, hypertension, diabetes who comes the ED complaining of epigastric pain radiating to the back for the past 2 to 3 days.  Also reports no bowel movement in the last 3 days.  Or oral intake, vomiting when he tries to eat.  No fever chills chest pain or shortness of breath.  Saw his doctor 2 days ago who told him it was probably a strained muscle.   Pain is moderate intensity, 5/10, described as sharp.     Past Medical History:  Diagnosis Date  . Atrial flutter ([place])    a. s/p ablation 09/2017.  . CKD (chronic kidney disease), stage III ([place])   . COPD (chronic obstructive pulmonary disease) ([place])   . Diabetes mellitus without complication ([place])   . Hyperlipidemia   . Hypertension   . Polycythemia      Patient Active Problem List   Diagnosis Date Noted  . S/P ablation of atrial flutter 10/15/2017  . Atrial flutter ([place]) 10/14/2017  . Leukocytosis 11/19/2016  . Erythrocytosis 11/19/2016  . Diabetes type 2, uncontrolled ([place]) 09/01/2015  . 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 CAD (coronary artery disease) 09/01/2015  . Left main coronary artery disease 09/01/2015  . COPD type A ([place]) 01/02/2015  . Encounter for preoperative pulmonary examination 01/02/2015  . Typical atrial flutter ([place]) 11/27/2014  . Smoking history 11/27/2014  . Emphysema of lung ([place]) 11/27/2014  . Pneumonia, organism unspecified([place]) 11/27/2014  . Essential hypertension 11/27/2014  . Adrenal nodule ([place]) 06/11/2014  . B12 deficiency 06/11/2014  . Hypertriglyceridemia 06/11/2014  . Microalbuminuria 06/11/2014     Past Surgical History:   Procedure Laterality Date  . A-FLUTTER ABLATION N/A 10/14/2017   Procedure: A-FLUTTER ABLATION;  Surgeon: Constance Haw, MD;  Location: [facility];  Service: Cardiovascular;  Laterality: N/A;  . CARDIOVERSION N/A 09/08/2017   Procedure: CARDIOVERSION;  Surgeon: [name], MD;  Location: [facility];  Service: Cardiovascular;  Laterality: N/A;  . CARDIOVERSION N/A 03/22/2018   Procedure: CARDIOVERSION;  Surgeon: [name], MD;  Location: [facility];  Service: Cardiovascular;  Laterality: N/A;  . ELECTROPHYSIOLOGIC STUDY N/A 10/27/2016   Procedure: A-Flutter Ablation;  Surgeon: Deboraha Sprang, MD;  Location: [place] [facility];  Service: Cardiovascular;  Laterality: N/A;  . EUS N/A 12/22/2017   Procedure: FULL UPPER ENDOSCOPIC ULTRASOUND (EUS) RADIAL;  Surgeon: Reita Cliche, MD;  Location: [facility];  Service: Gastroenterology;  Laterality: N/A;  . TEE WITH CARDIOVERSION  12/16     Prior to Admission medications   Medication Sig Start Date End Date Taking? Authorizing Provider  acetaminophen (TYLENOL) 500 MG tablet Take 500-1,000 mg by mouth every 6 (six) hours as needed for moderate pain or headache.     [provider]  albuterol (PROAIR HFA) 108 (90 Base) MCG/ACT inhaler Inhale 2 puffs into the lungs every 6 (six) hours as needed for wheezing or shortness of breath.    [provider]  apixaban (ELIQUIS) 5 MG TABS tablet Take 1 tablet (5 mg total) by mouth 2 (two) times daily. 02/07/18   [name], MD  budesonide-formoterol (SYMBICORT) 80-4.5 MCG/ACT inhaler Inhale 1 puff into the  lungs 2 times daily at 12 noon and 4 pm. 04/05/17   Flora Lipps, MD  diltiazem (CARDIZEM CD) 180 MG 24 hr capsule Take 180 mg by mouth daily.    [provider]  guaiFENesin-codeine 100-10 MG/5ML syrup Take 5 mLs by mouth every 4 (four) hours as needed for cough. 02/07/18   Flora Lipps, MD  JANUMET XR 50-500 MG TB24 Take 1 tablet by mouth daily.  07/28/18   [provider]  levothyroxine (SYNTHROID, LEVOTHROID) 50 MCG tablet Take 50 mcg by mouth daily. 06/13/18   [provider]  losartan (COZAAR) 25 MG tablet Take 1 tablet (25 mg total) by mouth daily. 03/27/18   [name], MD  metoprolol succinate (TOPROL-XL) 50 MG 24 hr tablet Take 1 tablet (50 mg total) by mouth every evening. Take with or immediately following a meal. 02/15/18   Gollan, Kathlene November, MD  rosuvastatin (CRESTOR) 40 MG tablet TAKE 1 TABLET BY MOUTH ONCE DAILY 05/03/18   [name], MD  Tiotropium Bromide Monohydrate (SPIRIVA RESPIMAT) 1.25 MCG/ACT AERS Inhale 2 puffs into the lungs daily.    [provider]  vitamin B-12 (CYANOCOBALAMIN) 500 MCG tablet Take 500 mcg by mouth daily.    [provider]     Allergies Levaquin [levofloxacin in d5w]   Family History  Problem Relation Age of Onset  . Hypertension Father     Social History Social History   Tobacco Use  . Smoking status: Former Smoker    Packs/day: 1.50    Years: 45.00    Pack years: 67.50    Types: Cigarettes    Last attempt to quit: 11/27/2012    Years since quitting: 5.9  . Smokeless tobacco: Never Used  Substance Use Topics  . Alcohol use: Yes    Alcohol/week: 2.0 standard drinks    Types: 2 Cans of beer per week    Comment: beer every once a while once a month  . Drug use: No    Review of Systems  Constitutional:   No fever or chills.  ENT:   No sore throat. No rhinorrhea. Cardiovascular:   No chest pain or syncope. Respiratory:   No dyspnea or cough. Gastrointestinal: Positive as above for abdominal pain, vomiting, constipation  musculoskeletal:   Negative for focal pain or swelling All other systems reviewed and are negative except as documented above in ROS and HPI.  ____________________________________________   PHYSICAL EXAM:  VITAL SIGNS: ED Triage Vitals  Enc Vitals Group     BP 11/25/18 1708 (!) 110/53     Pulse Rate  11/25/18 1708 (!) 132     Resp 11/25/18 1708 20     Temp 11/25/18 1708 97.6 F (36.4 C)     Temp Source 11/25/18 1708 Oral     SpO2 11/25/18 1708 94 %     Weight 11/25/18 1709 188 lb (85.3 kg)     Height 11/25/18 1709 5\' 7"  (1.702 m)     Head Circumference --      Peak Flow --      Pain Score 11/25/18 1709 5     Pain Loc --      Pain Edu? --      Excl. in [place]? --     Vital signs reviewed, nursing assessments reviewed.   Constitutional:   Alert and oriented. Non-toxic appearance. Eyes:   Conjunctivae are normal. EOMI. PERRL. ENT      Head:   Normocephalic and atraumatic.  Nose:   No congestion/rhinnorhea.       Mouth/Throat:   MMM, no pharyngeal erythema. No peritonsillar mass.       Neck:   No meningismus. Full ROM. Hematological/Lymphatic/Immunilogical:   No cervical lymphadenopathy. Cardiovascular:   RRR. Symmetric bilateral radial and DP pulses.  No murmurs. Cap refill less than 2 seconds. Respiratory:   Normal respiratory effort without tachypnea/retractions. Breath sounds are clear and equal bilaterally. No wheezes/rales/rhonchi. Gastrointestinal:   Soft with epigastric tenderness. Non distended. There is no CVA tenderness.  No rebound, rigidity, or guarding. Musculoskeletal:   Normal range of motion in all extremities. No joint effusions.  No lower extremity tenderness.  No edema. Neurologic:   Normal speech and language.  Motor grossly intact. No acute focal neurologic deficits are appreciated.  Skin:    Skin is warm, dry and intact. No rash noted.  No petechiae, purpura, or bullae.  ____________________________________________    LABS (pertinent positives/negatives) (all labs ordered are listed, but only abnormal results are displayed) Labs Reviewed  LIPASE, BLOOD - Abnormal; Notable for the following components:      Result Value   Lipase 473 (*)    All other components within normal limits  COMPREHENSIVE METABOLIC PANEL - Abnormal; Notable for the following  components:   Glucose, Bld 145 (*)    BUN 30 (*)    Creatinine, Ser 2.37 (*)    AST 13 (*)    GFR calc non Af Amer 26 (*)    GFR calc Af Amer 30 (*)    All other components within normal limits  CBC - Abnormal; Notable for the following components:   MCV 101.4 (*)    RDW 11.3 (*)    Platelets 142 (*)    All other components within normal limits  URINALYSIS, COMPLETE (UACMP) WITH MICROSCOPIC - Abnormal; Notable for the following components:   Color, Urine YELLOW (*)    APPearance CLOUDY (*)    Hgb urine dipstick MODERATE (*)    Protein, ur 100 (*)    Leukocytes, UA TRACE (*)    Bacteria, UA FEW (*)    All other components within normal limits  TROPONIN I   ____________________________________________   EKG Interpreted by me Atrial flutter with 2-1 conduction, rate of 130.  Left axis, normal intervals.  Normal QRS ST segments and T waves.  Repeat EKG interpreted by me Atrial flutter, 2-1 conduction, rate 127.  Grossly unchanged, no ischemic changes.  Right-sided leads in V5 and V6 are nonischemic.   ____________________________________________    RADIOLOGY  Ct Abdomen Pelvis Wo Contrast  Result Date: 11/25/2018 CLINICAL DATA:  Pt c/o abdominal pain to epigastric area that radiates to lower abdomen straight down center of abdomen. Also c/o back pain but describes as all over back. Unsure if chest and back pain are related as pt having difficulty describing both. Has had vomiting. No bowel movement in 2-3 days EXAM: CT ABDOMEN AND PELVIS WITHOUT CONTRAST TECHNIQUE: Multidetector CT imaging of the abdomen and pelvis was performed following the standard protocol without IV contrast. COMPARISON:  10/18/2018 and older exams. FINDINGS: Lower chest: Clear lung bases. Hepatobiliary: Liver is unremarkable. Possible small stones in the gallbladder neck. No gallbladder wall thickening. No bile duct dilation. Pancreas: Mass head of the pancreas measuring 2.1 x 1.7 cm, without significant  change prior CT, as well as an MRI dated 11/16/2017. There is peripancreatic inflammation. The superior head through the tail of the pancreas is mildly thickened with an indistinct margin.  Pancreatic duct is irregularly dilated. No pancreatic mass. No fluid collection is seen to suggest a pseudocyst. Spleen: Normal in size without focal abnormality. Adrenals/Urinary Tract: 2.1 cm left adrenal adenoma, stable. Normal right adrenal gland. Kidneys normal in size, orientation and position. No masses, collecting system stones or hydronephrosis. Normal ureters. Bladder is decompressed but otherwise unremarkable. Stomach/Bowel: Lobulated, partly calcified mesenteric mass in the right mid abdomen measures 2.5 x 1.9 x 1.3 cm, unchanged from the prior CT. Stomach is unremarkable. Small bowel and colon are normal in caliber. No wall thickening or inflammation. No evidence of appendicitis. Vascular/Lymphatic: Aortic atherosclerotic calcifications. No aneurysm. There are prominent and enlarged peripancreatic lymph nodes, largest measuring 1.4 cm in short axis, between the pancreatic head and duodenum. Reproductive: Unremarkable. Other: No abdominal wall hernia.  No ascites. Musculoskeletal: No fracture or acute finding. No osteoblastic or osteolytic lesions. 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 IMPRESSION: 1. Findings consistent with acute pancreatitis. 2. 2.1 cm head of the pancreas mass is stable from the prior exam and abdomen MRI, 11/16/2017. 3. Partly calcified mesenteric mass is also stable. 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 4. Peripancreatic adenopathy, similar to the prior CT. 5. Left adrenal adenoma, stable. 6. Aortic atherosclerosis. Electronically Signed   By: Lajean Manes M.D.   On: 11/25/2018 18:13     ____________________________________________   PROCEDURES Procedures  ____________________________________________  DIFFERENTIAL DIAGNOSIS   Bowel obstruction, perforation, diverticulitis, pancreatitis, biliary disease, gastritis  CLINICAL IMPRESSION / ASSESSMENT AND PLAN / ED COURSE  Pertinent labs & imaging results that were available during my care of the patient were reviewed by me and considered in my medical decision making (see chart for details).    Patient presents with epigastric pain radiating to the back.  EMR shows a history of metastatic carcinoid tumor as well as a mass in the head of the pancreas raising possibility for intussusception abscess perforation small bowel obstruction or pancreatitis.  CT from 1 month ago shows that the patient does not have a AAA.  Will need a repeat CT scan today, noncontrast due to his CKD.  Check labs.  Patient declines pain medicine or antiemetics at this time.  IV fluids for hydration.  Clinical Course as of Nov 25 1921  Sat Nov 25, 2018  1824 Baseline ckd  Creatinine(!): 2.37 [PS]  1825 Ct c/w acute pancreatitis. Will continue IVF, plan to keep NPO and admit  CT ABDOMEN PELVIS WO CONTRAST [PS]    Clinical Course User Index [PS] Carrie Mew, MD     ----------------------------------------- 8:14 PM on 11/25/2018 -----------------------------------------  Case discussed with Dr. Posey Pronto for further management.  ____________________________________________   FINAL CLINICAL IMPRESSION(S) / ED DIAGNOSES    Final diagnoses:  Epigastric pain  Acute pancreatitis, unspecified complication status, unspecified pancreatitis type     ED Discharge Orders    None      Portions of this note were generated with dragon dictation software. Dictation errors may occur despite best attempts at proofreading.   Carrie Mew, MD 11/25/18 2014

## 2018-11-25 NOTE — ED Notes (Signed)
Pt given warm blanket. Pt in NAD, respirations even and unlabored. Pt denies any needs at this time. This RN will continue to monitor.

## 2018-11-26 LAB — GLUCOSE, CAPILLARY
Glucose-Capillary: 62 mg/dL — ABNORMAL LOW (ref 70–99)
Glucose-Capillary: 145 mg/dL — ABNORMAL HIGH (ref 70–99)
Glucose-Capillary: 152 mg/dL — ABNORMAL HIGH (ref 70–99)

## 2018-11-26 LAB — BASIC METABOLIC PANEL
Anion gap: 9 (ref 5–15)
BUN: 30 mg/dL — ABNORMAL HIGH (ref 8–23)
CO2: 22 mmol/L (ref 22–32)
Calcium: 8.7 mg/dL — ABNORMAL LOW (ref 8.9–10.3)
Chloride: 109 mmol/L (ref 98–111)
Creatinine, Ser: 2.12 mg/dL — ABNORMAL HIGH (ref 0.61–1.24)
GFR calc Af Amer: 34 mL/min — ABNORMAL LOW (ref >60)
GFR calc non Af Amer: 30 mL/min — ABNORMAL LOW (ref >60)
Glucose, Bld: 77 mg/dL (ref 70–99)
POTASSIUM: 4 mmol/L (ref 3.5–5.1)
Sodium: 140 mmol/L (ref 135–145)

## 2018-11-26 LAB — CBC
HCT: 46.6 % (ref 39.0–52.0)
Hemoglobin: 14.6 g/dL (ref 13.0–17.0)
MCH: 31.3 pg (ref 26.0–34.0)
MCHC: 31.3 g/dL (ref 30.0–36.0)
MCV: 99.8 fL (ref 80.0–100.0)
PLATELETS: 122 10*3/uL — AB (ref 150–400)
RBC: 4.67 MIL/uL (ref 4.22–5.81)
RDW: 11.3 % — ABNORMAL LOW (ref 11.5–15.5)
WBC: 6.2 10*3/uL (ref 4.0–10.5)
nRBC: 0 % (ref 0.0–0.2)

## 2018-11-26 LAB — LIPID PANEL
Cholesterol: 79 mg/dL (ref 0–200)
HDL: 32 mg/dL — ABNORMAL LOW (ref >40)
LDL Cholesterol: 27 mg/dL (ref 0–99)
Total CHOL/HDL Ratio: 2.5 RATIO
Triglycerides: 98 mg/dL (ref <150)
VLDL: 20 mg/dL (ref 0–40)

## 2018-11-26 LAB — LIPASE, BLOOD: Lipase: 358 U/L — ABNORMAL HIGH (ref 11–51)

## 2018-11-26 MED ORDER — INSULIN ASPART 100 UNIT/ML ~~LOC~~ SOLN
0.0000 [IU] | Freq: Three times a day (TID) | SUBCUTANEOUS | Status: DC
  Administered 2018-11-26: 12:00:00 1 [IU] via SUBCUTANEOUS
  Administered 2018-11-26: 2 [IU] via SUBCUTANEOUS
  Administered 2018-11-27 (×3): 1 [IU] via SUBCUTANEOUS
  Administered 2018-11-28: 13:00:00 5 [IU] via SUBCUTANEOUS
  Filled 2018-11-26 (×5): qty 1

## 2018-11-26 MED ORDER — INSULIN ASPART 100 UNIT/ML ~~LOC~~ SOLN: 0.0000 [IU] | Freq: Every day | SUBCUTANEOUS | Status: DC

## 2018-11-26 NOTE — Plan of Care (Signed)
  Problem: Education: Goal: Knowledge of General Education information will improve Description Including pain rating scale, medication(s)/side effects and non-pharmacologic comfort measures Outcome: Progressing   Problem: Education: Goal: Knowledge of Pancreatitis treatment and prevention will improve Outcome: Progressing   Problem: Health Behavior/Discharge Planning: Goal: Ability to formulate a plan to maintain an alcohol-free life will improve Outcome: Progressing   Problem: Nutritional: Goal: Ability to achieve adequate nutritional intake will improve Outcome: Progressing   Problem: Clinical Measurements: Goal: Complications related to the disease process, condition or treatment will be avoided or minimized Outcome: Progressing

## 2018-11-26 NOTE — Progress Notes (Signed)
[facility] [place] NAME: [name]    MR#:  [id number]  DATE OF BIRTH:  [birth year]  SUBJECTIVE:   Patient still complaining of abdominal pain but improved since yesterday.  Lipase has trended down.  Denies any nausea or vomiting.  REVIEW OF SYSTEMS:    Review of Systems  Constitutional: Negative for chills and fever.  HENT: Negative for congestion and tinnitus.   Eyes: Negative for blurred vision and double vision.  Respiratory: Negative for cough, shortness of breath and wheezing.   Cardiovascular: Negative for chest pain, orthopnea and PND.  Gastrointestinal: Positive for abdominal pain. Negative for diarrhea, nausea and vomiting.  Genitourinary: Negative for dysuria and hematuria.  Neurological: Negative for dizziness, sensory change and focal weakness.  All other systems reviewed and are negative.   Nutrition: Clear Liquids Tolerating Diet: Yes Tolerating PT: Ambulatory   DRUG ALLERGIES:   Allergies  Allergen Reactions  . Levaquin [Levofloxacin In D5w] Swelling and Rash    VITALS:  Blood pressure 131/80, pulse 88, temperature 98.1 F (36.7 C), temperature source Oral, resp. rate (!) 28, height 5\' 7"  (1.702 m), weight 85.3 kg, SpO2 91 %.  PHYSICAL EXAMINATION:   Physical Exam  GENERAL:  73 y.o.-year-old patient lying in bed in no acute distress.  EYES: Pupils equal, round, reactive to light and accommodation. No scleral icterus. Extraocular muscles intact.  HEENT: Head atraumatic, normocephalic. Oropharynx and nasopharynx clear.  NECK:  Supple, no jugular venous distention. No thyroid enlargement, no tenderness.  LUNGS: Normal breath sounds bilaterally, no wheezing, rales, rhonchi. No use of accessory muscles of respiration.  CARDIOVASCULAR: S1, S2 normal. No murmurs, rubs, or gallops.  ABDOMEN: Soft, mid abdomen/epigastric abdominal pain but no rebound, rigidity, nondistended. Bowel sounds present. No  organomegaly or mass.  EXTREMITIES: No cyanosis, clubbing or edema b/l.    NEUROLOGIC: Cranial nerves II through XII are intact. No focal Motor or sensory deficits b/l.   PSYCHIATRIC: The patient is alert and oriented x 3.  SKIN: No obvious rash, lesion, or ulcer.    LABORATORY PANEL:   CBC Recent Labs  Lab 11/26/18 0504  WBC 6.2  HGB 14.6  HCT 46.6  PLT 122*   ------------------------------------------------------------------------------------------------------------------  Chemistries  Recent Labs  Lab 11/25/18 1719 11/26/18 0504  NA 139 140  K 4.0 4.0  CL 103 109  CO2 23 22  GLUCOSE 145* 77  BUN 30* 30*  CREATININE 2.37* 2.12*  CALCIUM 9.2 8.7*  AST 13*  --   ALT 11  --   ALKPHOS 82  --   BILITOT 0.8  --    ------------------------------------------------------------------------------------------------------------------  Cardiac Enzymes Recent Labs  Lab 11/25/18 1719  TROPONINI <0.03   ------------------------------------------------------------------------------------------------------------------  RADIOLOGY:  Ct Abdomen Pelvis Wo Contrast  Result Date: 11/25/2018 CLINICAL DATA:  Pt c/o abdominal pain to epigastric area that radiates to lower abdomen straight down center of abdomen. Also c/o back pain but describes as all over back. Unsure if chest and back pain are related as pt having difficulty describing both. Has had vomiting. No bowel movement in 2-3 days EXAM: CT ABDOMEN AND PELVIS WITHOUT CONTRAST TECHNIQUE: Multidetector CT imaging of the abdomen and pelvis was performed following the standard protocol without IV contrast. COMPARISON:  10/18/2018 and older exams. FINDINGS: Lower chest: Clear lung bases. Hepatobiliary: Liver is unremarkable. Possible small stones in the gallbladder neck. No gallbladder wall thickening. No bile duct dilation. Pancreas: Mass head of the pancreas measuring  2.1 x 1.7 cm, without significant change prior CT, as well as an MRI  dated 11/16/2017. There is peripancreatic inflammation. The superior head through the tail of the pancreas is mildly thickened with an indistinct margin. Pancreatic duct is irregularly dilated. No pancreatic mass. No fluid collection is seen to suggest a pseudocyst. Spleen: Normal in size without focal abnormality. Adrenals/Urinary Tract: 2.1 cm left adrenal adenoma, stable. Normal right adrenal gland. Kidneys normal in size, orientation and position. No masses, collecting system stones or hydronephrosis. Normal ureters. Bladder is decompressed but otherwise unremarkable. Stomach/Bowel: Lobulated, partly calcified mesenteric mass in the right mid abdomen measures 2.5 x 1.9 x 1.3 cm, unchanged from the prior CT. Stomach is unremarkable. Small bowel and colon are normal in caliber. No wall thickening or inflammation. No evidence of appendicitis. Vascular/Lymphatic: Aortic atherosclerotic calcifications. No aneurysm. There are prominent and enlarged peripancreatic lymph nodes, largest measuring 1.4 cm in short axis, between the pancreatic head and duodenum. Reproductive: Unremarkable. Other: No abdominal wall hernia.  No ascites. Musculoskeletal: No fracture or acute finding. No osteoblastic or osteolytic lesions. 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 IMPRESSION: 1. Findings consistent with acute pancreatitis. 2. 2.1 cm head of the pancreas mass is stable from the prior exam and abdomen MRI, 11/16/2017. 3. Partly calcified mesenteric mass is also stable. 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 4. Peripancreatic adenopathy, similar to the prior CT. 5. Left adrenal adenoma, stable. 6. Aortic atherosclerosis. Electronically Signed   By: Lajean Manes M.D.   On: 11/25/2018 18:13     ASSESSMENT AND PLAN:   73 year old male with past medical history of recently diagnosed metastatic carcinoid tumor currently not on treatment, diabetes,  atrial fibrillation, hyperlipidemia, hypothyroidism who presented to the hospital due to abdominal pain and nausea.  1.  Acute pancreatitis- cause of patient's abdominal pain and nausea.  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 - Patient's lipid profile is normal, no evidence of alcohol abuse.  Continue supportive care with IV fluids, antiemetics, pain control.  Lipase has trended down.  Will start on clear liquid diet today.  2.  Carcinoid tumor-recently diagnosed, currently followed by oncology but not on treatment. - see's Dr. Janese Banks.   3. A. Fib- rate controlled, continue Cardizem.  Continue Eliquis.  4.  CKD stage IV-creatinine is close to baseline we will continue to monitor. -May need outpatient nephrology follow-up.  5.  Essential hypertension-continue Toprol, losartan.  6.  Hypothyroidism-continue Synthroid.  7.  Hyperlipidemia-continue Crestor.     All the records are reviewed and case discussed with Care Management/Social Worker. Management plans discussed with the patient, family and they are in agreement.  CODE STATUS: Full code  DVT Prophylaxis: Eliquis  TOTAL TIME TAKING CARE OF THIS PATIENT: 30 minutes.   POSSIBLE D/C IN 1-2 DAYS, DEPENDING ON CLINICAL CONDITION.   Henreitta Leber M.D on 11/26/2018 at 11:20 AM  Between 7am to 6pm - Pager - [phone]  After 6pm go to [email or web address] - Proofreader  [facility]  Office  [phone]  CC: Primary care physician; [name], MD

## 2018-11-27 ENCOUNTER — Other Ambulatory Visit: Payer: Self-pay | Admitting: Oncology

## 2018-11-27 ENCOUNTER — Inpatient Hospital Stay: Payer: PPO

## 2018-11-27 DIAGNOSIS — K859 Acute pancreatitis without necrosis or infection, unspecified: Principal | ICD-10-CM

## 2018-11-27 DIAGNOSIS — Z87891 Personal history of nicotine dependence: Secondary | ICD-10-CM

## 2018-11-27 DIAGNOSIS — C259 Malignant neoplasm of pancreas, unspecified: Secondary | ICD-10-CM

## 2018-11-27 LAB — GLUCOSE, CAPILLARY
Glucose-Capillary: 117 mg/dL — ABNORMAL HIGH (ref 70–99)
Glucose-Capillary: 121 mg/dL — ABNORMAL HIGH (ref 70–99)
Glucose-Capillary: 148 mg/dL — ABNORMAL HIGH (ref 70–99)
Glucose-Capillary: 127 mg/dL — ABNORMAL HIGH (ref 70–99)
Glucose-Capillary: 164 mg/dL — ABNORMAL HIGH (ref 70–99)

## 2018-11-27 LAB — COMPREHENSIVE METABOLIC PANEL
ALT: 10 U/L (ref 0–44)
AST: 11 U/L — ABNORMAL LOW (ref 15–41)
Albumin: 3.4 g/dL — ABNORMAL LOW (ref 3.5–5.0)
Alkaline Phosphatase: 76 U/L (ref 38–126)
Anion gap: 6 (ref 5–15)
BUN: 24 mg/dL — ABNORMAL HIGH (ref 8–23)
CO2: 25 mmol/L (ref 22–32)
CREATININE: 1.83 mg/dL — AB (ref 0.61–1.24)
Calcium: 8.4 mg/dL — ABNORMAL LOW (ref 8.9–10.3)
Chloride: 108 mmol/L (ref 98–111)
GFR calc Af Amer: 41 mL/min — ABNORMAL LOW (ref >60)
GFR calc non Af Amer: 36 mL/min — ABNORMAL LOW (ref >60)
Glucose, Bld: 116 mg/dL — ABNORMAL HIGH (ref 70–99)
Potassium: 4 mmol/L (ref 3.5–5.1)
Sodium: 139 mmol/L (ref 135–145)
Total Bilirubin: 0.5 mg/dL (ref 0.3–1.2)
Total Protein: 5.9 g/dL — ABNORMAL LOW (ref 6.5–8.1)

## 2018-11-27 NOTE — Consult Note (Signed)
[name] , MD [address] [place], [place], [place], [id number], [place], [place], [place], [address] Phone: [phone]  Fax: [phone]  Consultation  Referring Provider:  Dr Verdell Carmine Primary Care Physician:  [name], MD Primary Gastroenterologist:  None          Reason for Consultation:    Pancreatitis  Date of Admission:  11/25/2018 Date of Consultation:  11/27/2018         HPI:   [name] is a 73 y.o. male who follows with oncology for history of carcinoid with metastatic disease.  Metastatic disease is noted in the mesentery.  Nodal metastasis of the root of mesenteric vessels.  On the dotatate scan in November 2018 focal activity within the small bowel noted that could represent a primary neuroendocrine tumor neoplasm.  MRI in January 2019 showed a 1.8 x 2 cm hypoenhancing mass in the abdominal mesentery, 2.2 x 1.8 cm mass in the pancreatic head without hypermetabolism.  Dilation of the main pancreatic duct in the pancreatic tail.  Subsequently underwent EUS plus FNA that revealed neuroendocrine tumor.  He has been observed as an outpatient and not receiving any specific treatment at this point of time.  He was admitted on 11/25/2018 a 2-day history abdominal pain.  Admission had a lipase of 473 normal triglycerides, hemoglobin of 14.6 platelet count of 122, creatinine of 2.12.  No elevation of transaminases.  CT scan of the abdomen demonstrated features consistent with acute pancreatitis.  Pancreatic mass is stable.  Peripancreatic adenopathy noted.  States the pain began about 2 days back all of a sudden.  Lower part of the abdomen radiating to the sides.  Presently has no pain.  Denies similar episodes of pancreatitis in the past.  Denies excess alcohol consumption in the past.  Denies any new medications that he has commenced.  Will tolerate clears at this point of time.   Past Medical History:  Diagnosis Date  . Atrial flutter ([place])    a. s/p ablation 09/2017.  . CKD (chronic kidney disease), stage III ([place])   . COPD (chronic obstructive pulmonary disease) ([place])   . Diabetes mellitus without complication ([place])   . Hyperlipidemia   . Hypertension   . Polycythemia     Past Surgical History:  Procedure Laterality Date  . A-FLUTTER ABLATION N/A 10/14/2017   Procedure: A-FLUTTER ABLATION;  Surgeon: Constance Haw, MD;  Location: [facility];  Service: Cardiovascular;  Laterality: N/A;  . CARDIOVERSION N/A 09/08/2017   Procedure: CARDIOVERSION;  Surgeon: [name], MD;  Location: [facility];  Service: Cardiovascular;  Laterality: N/A;  . CARDIOVERSION N/A 03/22/2018   Procedure: CARDIOVERSION;  Surgeon: [name], MD;  Location: [facility];  Service: Cardiovascular;  Laterality: N/A;  . ELECTROPHYSIOLOGIC STUDY N/A 10/27/2016   Procedure: A-Flutter Ablation;  Surgeon: Deboraha Sprang, MD;  Location: [facility];  Service: Cardiovascular;  Laterality: N/A;  . EUS N/A 12/22/2017   Procedure: FULL UPPER ENDOSCOPIC ULTRASOUND (EUS) RADIAL;  Surgeon: Reita Cliche, MD;  Location: [facility];  Service: Gastroenterology;  Laterality: N/A;  . TEE WITH CARDIOVERSION  12/16    Prior to Admission medications   Medication Sig Start Date End Date Taking? Authorizing Provider  acetaminophen (TYLENOL) 500 MG tablet Take 500-1,000 mg by mouth every 6 (six) hours as needed for moderate pain or headache.    Yes [provider]  albuterol (PROAIR HFA) 108 (90 Base) MCG/ACT inhaler  Inhale 2 puffs into the lungs every 4 (four) hours as needed for wheezing or shortness of breath.    Yes [provider]  apixaban (ELIQUIS) 5 MG TABS tablet Take 1 tablet (5 mg total) by mouth 2 (two) times daily. 02/07/18  Yes [name], MD  budesonide-formoterol (SYMBICORT) 80-4.5 MCG/ACT inhaler Inhale 1 puff into the lungs 2 times daily at 12 noon and 4 pm. 04/05/17  Yes Flora Lipps, MD  diltiazem  (CARDIZEM CD) 180 MG 24 hr capsule Take 180 mg by mouth daily.   Yes [provider]  JANUMET XR 50-500 MG TB24 Take 1 tablet by mouth daily. 07/28/18  Yes [provider]  levothyroxine (SYNTHROID, LEVOTHROID) 50 MCG tablet Take 50 mcg by mouth daily. 06/13/18  Yes [provider]  losartan (COZAAR) 25 MG tablet Take 1 tablet (25 mg total) by mouth daily. 03/27/18  Yes [name], MD  metoprolol succinate (TOPROL-XL) 50 MG 24 hr tablet Take 1 tablet (50 mg total) by mouth every evening. Take with or immediately following a meal. 02/15/18  Yes Gollan, Kathlene November, MD  rosuvastatin (CRESTOR) 40 MG tablet TAKE 1 TABLET BY MOUTH ONCE DAILY Patient taking differently: Take 40 mg by mouth daily.  05/03/18  Yes Gollan, Kathlene November, MD  Tiotropium Bromide Monohydrate (SPIRIVA RESPIMAT) 1.25 MCG/ACT AERS Inhale 1 puff into the lungs daily.    Yes [provider]  vitamin B-12 (CYANOCOBALAMIN) 500 MCG tablet Take 500 mcg by mouth daily.   Yes [provider]  guaiFENesin-codeine 100-10 MG/5ML syrup Take 5 mLs by mouth every 4 (four) hours as needed for cough. 02/07/18   Flora Lipps, MD    Family History  Problem Relation Age of Onset  . Hypertension Father      Social History   Tobacco Use  . Smoking status: Former Smoker    Packs/day: 1.50    Years: 45.00    Pack years: 67.50    Types: Cigarettes    Last attempt to quit: 11/27/2012    Years since quitting: 6.0  . Smokeless tobacco: Never Used  Substance Use Topics  . Alcohol use: Yes    Alcohol/week: 2.0 standard drinks    Types: 2 Cans of beer per week    Comment: beer every once a while once a month  . Drug use: No    Allergies as of 11/25/2018 - Review Complete 11/25/2018  Allergen Reaction Noted  . Levaquin [levofloxacin in d5w] Swelling and Rash 11/27/2014    Review of Systems:    All systems reviewed and negative except where noted in HPI.   Physical Exam:  Vital signs in last 24  hours: Temp:  [97.6 F (36.4 C)-98.6 F (37 C)] 98.5 F (36.9 C) (01/13 0845) Pulse Rate:  [81-100] 83 (01/13 0845) Resp:  [18-20] 18 (01/13 0845) BP: (98-132)/(57-87) 128/86 (01/13 0845) SpO2:  [93 %-97 %] 97 % (01/13 0845) Last BM Date: 11/26/18 General:   Pleasant, cooperative in NAD Head:  Normocephalic and atraumatic. Eyes:   No icterus.   Conjunctiva pink. PERRLA. Ears:  Normal auditory acuity. Neck:  Supple; no masses or thyroidomegaly Lungs: Respirations even and unlabored. Lungs clear to auscultation bilaterally.   No wheezes, crackles, or rhonchi.  Heart:  Regular rate and rhythm;  Without murmur, clicks, rubs or gallops Abdomen:  Soft, nondistended, nontender. Normal bowel sounds. No appreciable masses or hepatomegaly.  No rebound or guarding.  Neurologic:  Alert and oriented x3;  grossly normal  neurologically. Skin:  Intact without significant lesions or rashes. Cervical Nodes:  No significant cervical adenopathy. Psych:  Alert and cooperative. Normal affect.  LAB RESULTS: Recent Labs    11/25/18 1719 11/26/18 0504  WBC 8.2 6.2  HGB 15.9 14.6  HCT 50.9 46.6  PLT 142* 122*   BMET Recent Labs    11/25/18 1719 11/26/18 0504 11/27/18 0410  NA 139 140 139  K 4.0 4.0 4.0  CL 103 109 108  CO2 23 22 25   GLUCOSE 145* 77 116*  BUN 30* 30* 24*  CREATININE 2.37* 2.12* 1.83*  CALCIUM 9.2 8.7* 8.4*   LFT Recent Labs    11/27/18 0410  PROT 5.9*  ALBUMIN 3.4*  AST 11*  ALT 10  ALKPHOS 76  BILITOT 0.5   PT/INR No results for input(s): LABPROT, INR in the last 72 hours.  STUDIES: Ct Abdomen Pelvis Wo Contrast  Result Date: 11/25/2018 CLINICAL DATA:  Pt c/o abdominal pain to epigastric area that radiates to lower abdomen straight down center of abdomen. Also c/o back pain but describes as all over back. Unsure if chest and back pain are related as pt having difficulty describing both. Has had vomiting. No bowel movement in 2-3 days EXAM: CT ABDOMEN AND PELVIS  WITHOUT CONTRAST TECHNIQUE: Multidetector CT imaging of the abdomen and pelvis was performed following the standard protocol without IV contrast. COMPARISON:  10/18/2018 and older exams. FINDINGS: Lower chest: Clear lung bases. Hepatobiliary: Liver is unremarkable. Possible small stones in the gallbladder neck. No gallbladder wall thickening. No bile duct dilation. Pancreas: Mass head of the pancreas measuring 2.1 x 1.7 cm, without significant change prior CT, as well as an MRI dated 11/16/2017. There is peripancreatic inflammation. The superior head through the tail of the pancreas is mildly thickened with an indistinct margin. Pancreatic duct is irregularly dilated. No pancreatic mass. No fluid collection is seen to suggest a pseudocyst. Spleen: Normal in size without focal abnormality. Adrenals/Urinary Tract: 2.1 cm left adrenal adenoma, stable. Normal right adrenal gland. Kidneys normal in size, orientation and position. No masses, collecting system stones or hydronephrosis. Normal ureters. Bladder is decompressed but otherwise unremarkable. Stomach/Bowel: Lobulated, partly calcified mesenteric mass in the right mid abdomen measures 2.5 x 1.9 x 1.3 cm, unchanged from the prior CT. Stomach is unremarkable. Small bowel and colon are normal in caliber. No wall thickening or inflammation. No evidence of appendicitis. Vascular/Lymphatic: Aortic atherosclerotic calcifications. No aneurysm. There are prominent and enlarged peripancreatic lymph nodes, largest measuring 1.4 cm in short axis, between the pancreatic head and duodenum. Reproductive: Unremarkable. Other: No abdominal wall hernia.  No ascites. Musculoskeletal: No fracture or acute finding. No osteoblastic or osteolytic lesions. 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 IMPRESSION: 1. Findings consistent with acute pancreatitis. 2. 2.1 cm head of the pancreas mass is stable from the prior exam and abdomen MRI,  11/16/2017. 3. Partly calcified mesenteric mass is also stable. 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 4. Peripancreatic adenopathy, similar to the prior CT. 5. Left adrenal adenoma, stable. 6. Aortic atherosclerosis. Electronically Signed   By: Lajean Manes M.D.   On: 11/25/2018 18:13      Impression / Plan:   [name] is a 73 y.o. y/o male with a history of metastatic neuroendocrine tumor.  Admitted to the hospital with acute abdominal pain.  He has biochemical and radiological evidence of acute pancreatitis.  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  In terms of management of his pancreatitis I would suggest to continue on IV Ringer lactate while we gradually advance his diet.  I would suggest to advance diet as tolerated.  Analgesia as needed.  Check IgG4 levels.  It is possible that the pancreatic mass is causing compression of the pancreatic duct and leading to pancreatitis.  I have discussed with Dr. Verdell Carmine and he has ordered an MRCP.  There are new findings of compression of the pancreatic duct we would have to discuss with an advanced endoscopist to determine if they would be willing to place a pancreatic stent.  Thank you for involving me in the care of this patient.      LOS: 2 days   Carlos Bellows, MD  11/27/2018, 9:13 AM

## 2018-11-27 NOTE — Consult Note (Signed)
Hematology/Oncology Consult note [facility] Telephone:([phone] [phone]  Patient Care Team: [name], MD as PCP - General (Cardiology) [name], MD as Consulting Physician (Cardiology) Clent Jacks, RN as Registered Nurse   Name of the patient: [name]  [id number]  1946-05-08    Reason for consult: pancreatic carcinoid and ongoing pancreatitis   Requesting physician: Dr. 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  Patient was also found to have a 2.2 x 1.7 cm mass in his pancreatic head without overt hypermetabolism on his dotatate scan which was also biopsy-proven well-differentiated neuroendocrine tumor.  He has had this for over a year now and has been monitored conservatively and he has not required any treatment for this as he has remained overtly asymptomatic and was found to have low volume disease.Recent CT abdomen on 10/18/2018 also showed stable findings.  He presented to the ER with symptoms of worsening epigastric pain and repeat CT abdomen pelvis without contrast again showed 2.1 cm head of the pancreatic mass which was essentially stable over the last 1 year along with findings consistent with acute pancreatitis.  MRCP showed progressive dilation of the main pancreatic duct in the body and tail with pancreatic parenchymal edema and inflammatory changes consistent with pancreatitis.  Findings may be related to known suspected mass involving the pancreatic head which is not well studied in the noncontrast study.  No evidence of obvious tumor progression.  Currently is being treated conservatively for his pancreatitis with IV fluids and slow advancement of his diet.  His lipase has been going up  since the last 1 day  He still reports abdominal pain and back pain not significantly changed since admission. No fever. No nausea or vomiting. He is tolerating clear liquids well. He has had bowel movements  ECOG PS- 1  Pain scale- 4   Review of systems- Review of Systems  Constitutional: Negative for chills, fever, malaise/fatigue and weight loss.  HENT: Negative for congestion, ear discharge and nosebleeds.   Eyes: Negative for blurred vision.  Respiratory: Negative for cough, hemoptysis, sputum production, shortness of breath and wheezing.   Cardiovascular: Negative for chest pain, palpitations, orthopnea and claudication.  Gastrointestinal: Positive for abdominal pain. Negative for blood in stool, constipation, diarrhea, heartburn, melena, nausea and vomiting.  Genitourinary: Negative for dysuria, flank pain, frequency, hematuria and urgency.  Musculoskeletal: Positive for back pain. Negative for joint pain and myalgias.  Skin: Negative for rash.  Neurological: Negative for dizziness, tingling, focal weakness, seizures, weakness and headaches.  Endo/Heme/Allergies: Does not bruise/bleed easily.  Psychiatric/Behavioral: Negative for depression and suicidal ideas. The patient does not have insomnia.     Allergies  Allergen Reactions  . Levaquin [Levofloxacin In D5w] Swelling and Rash    Patient Active Problem List   Diagnosis Date Noted  . Acute pancreatitis 11/25/2018  . S/P ablation of atrial flutter 10/15/2017  . Atrial flutter ([place]) 10/14/2017  . Leukocytosis 11/19/2016  . Erythrocytosis 11/19/2016  . Diabetes type 2, uncontrolled ([place]) 09/01/2015  . 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 CAD (coronary artery disease) 09/01/2015  . Left main coronary artery disease 09/01/2015  . 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 Encounter for preoperative pulmonary examination 01/02/2015  . Typical  atrial flutter ([place]) 11/27/2014  . Smoking history 11/27/2014  .  Emphysema of lung ([place]) 11/27/2014  . Pneumonia, organism unspecified([place]) 11/27/2014  . Essential hypertension 11/27/2014  . Adrenal nodule ([place]) 06/11/2014  . B12 deficiency 06/11/2014  . Hypertriglyceridemia 06/11/2014  . Microalbuminuria 06/11/2014     Past Medical History:  Diagnosis Date  . Atrial flutter ([place])    a. s/p ablation 09/2017.  . CKD (chronic kidney disease), stage III ([place])   . COPD (chronic obstructive pulmonary disease) ([place])   . Diabetes mellitus without complication ([place])   . Hyperlipidemia   . Hypertension   . Polycythemia      Past Surgical History:  Procedure Laterality Date  . A-FLUTTER ABLATION N/A 10/14/2017   Procedure: A-FLUTTER ABLATION;  Surgeon: Constance Haw, MD;  Location: [place] [facility];  Service: Cardiovascular;  Laterality: N/A;  . CARDIOVERSION N/A 09/08/2017   Procedure: CARDIOVERSION;  Surgeon: [name], MD;  Location: [place] [facility];  Service: Cardiovascular;  Laterality: N/A;  . CARDIOVERSION N/A 03/22/2018   Procedure: CARDIOVERSION;  Surgeon: [name], MD;  Location: [facility];  Service: Cardiovascular;  Laterality: N/A;  . ELECTROPHYSIOLOGIC STUDY N/A 10/27/2016   Procedure: A-Flutter Ablation;  Surgeon: Deboraha Sprang, MD;  Location: [place] [facility];  Service: Cardiovascular;  Laterality: N/A;  . EUS N/A 12/22/2017   Procedure: FULL UPPER ENDOSCOPIC ULTRASOUND (EUS) RADIAL;  Surgeon: Reita Cliche, MD;  Location: [facility];  Service: Gastroenterology;  Laterality: N/A;  . TEE WITH CARDIOVERSION  12/16    Social History   Socioeconomic History  . Marital status: Widowed    Spouse name: Not on file  . Number of children: Not on file  . Years of education: Not on file  . Highest education level: Not on file  Occupational History  . Not on file  Social Needs  . Financial resource strain: Not on file  . Food insecurity:    Worry: Not on file    Inability: Not on file  . Transportation needs:      Medical: Not on file    Non-medical: Not on file  Tobacco Use  . Smoking status: Former Smoker    Packs/day: 1.50    Years: 45.00    Pack years: 67.50    Types: Cigarettes    Last attempt to quit: 11/27/2012    Years since quitting: 6.0  . Smokeless tobacco: Never Used  Substance and Sexual Activity  . Alcohol use: Yes    Alcohol/week: 2.0 standard drinks    Types: 2 Cans of beer per week    Comment: beer every once a while once a month  . Drug use: No  . Sexual activity: Not on file  Lifestyle  . Physical activity:    Days per week: Not on file    Minutes per session: Not on file  . Stress: Not on file  Relationships  . Social connections:    Talks on phone: Not on file    Gets together: Not on file    Attends religious service: Not on file    Active member of club or organization: Not on file    Attends meetings of clubs or organizations: Not on file    Relationship status: Not on file  . Intimate partner violence:    Fear of current or ex partner: Not on file    Emotionally abused: Not on file    Physically abused: Not on file    Forced  sexual activity: Not on file  Other Topics Concern  . Not on file  Social History Narrative  . Not on file     Family History  Problem Relation Age of Onset  . Hypertension Father      Current Facility-Administered Medications:  .  0.9 %  sodium chloride infusion, , Intravenous, Continuous, Dustin Flock, MD, Last Rate: 100 mL/hr at 11/27/18 1421 .  acetaminophen (TYLENOL) tablet 650 mg, 650 mg, Oral, Q6H PRN, 650 mg at 11/26/18 1417 **OR** acetaminophen (TYLENOL) suppository 650 mg, 650 mg, Rectal, Q6H PRN, Dustin Flock, MD .  albuterol (PROVENTIL) (2.5 MG/3ML) 0.083% nebulizer solution 3 mL, 3 mL, Inhalation, Q4H PRN, Dustin Flock, MD .  apixaban (ELIQUIS) tablet 5 mg, 5 mg, Oral, BID, Dustin Flock, MD, 5 mg at 11/27/18 0841 .  diltiazem (CARDIZEM CD) 24 hr capsule 180 mg, 180 mg, Oral, Daily, Dustin Flock,  MD, 180 mg at 11/27/18 0841 .  HYDROcodone-acetaminophen (NORCO/VICODIN) 5-325 MG per tablet 1-2 tablet, 1-2 tablet, Oral, Q4H PRN, Dustin Flock, MD .  insulin aspart (novoLOG) injection 0-5 Units, 0-5 Units, Subcutaneous, QHS, Sainani, Vivek J, MD .  insulin aspart (novoLOG) injection 0-9 Units, 0-9 Units, Subcutaneous, TID WC, Henreitta Leber, MD, 1 Units at 11/27/18 1141 .  levothyroxine (SYNTHROID, LEVOTHROID) tablet 50 mcg, 50 mcg, Oral, QAC breakfast, Dustin Flock, MD, 50 mcg at 11/27/18 0544 .  losartan (COZAAR) tablet 25 mg, 25 mg, Oral, Daily, Dustin Flock, MD, 25 mg at 11/27/18 0841 .  metoprolol succinate (TOPROL-XL) 24 hr tablet 50 mg, 50 mg, Oral, QPM, Dustin Flock, MD, 50 mg at 11/26/18 1722 .  mometasone-formoterol (DULERA) 100-5 MCG/ACT inhaler 2 puff, 2 puff, Inhalation, BID, Dustin Flock, MD, 2 puff at 11/27/18 0840 .  morphine 2 MG/ML injection 2 mg, 2 mg, Intravenous, Q4H PRN, Dustin Flock, MD .  ondansetron (ZOFRAN) tablet 4 mg, 4 mg, Oral, Q6H PRN **OR** ondansetron (ZOFRAN) injection 4 mg, 4 mg, Intravenous, Q6H PRN, Dustin Flock, MD .  rosuvastatin (CRESTOR) tablet 40 mg, 40 mg, Oral, Daily, Dustin Flock, MD, 40 mg at 11/27/18 0841 .  tiotropium (SPIRIVA) inhalation capsule ([facility] use ONLY) 18 mcg, 1 capsule, Inhalation, Daily, Dustin Flock, MD, 18 mcg at 11/27/18 0847 .  vitamin B-12 (CYANOCOBALAMIN) tablet 500 mcg, 500 mcg, Oral, Daily, Dustin Flock, MD, 500 mcg at 11/27/18 0841   Physical exam:  Vitals:   11/26/18 1259 11/27/18 0544 11/27/18 0845 11/27/18 1610  BP: 132/87 (!) 98/57 128/86 109/73  Pulse: 100 81 83 86  Resp: 20 18 18 20   Temp: 98.6 F (37 C) 97.6 F (36.4 C) 98.5 F (36.9 C) 98.1 F (36.7 C)  TempSrc: Oral  Oral Oral  SpO2: 93% 94% 97% 97%  Weight:      Height:       Physical Exam Constitutional:      General: He is not in acute distress. HENT:     Head: Normocephalic and atraumatic.  Eyes:     Pupils: Pupils  are equal, round, and reactive to light.  Neck:     Musculoskeletal: Normal range of motion.  Cardiovascular:     Rate and Rhythm: Normal rate and regular rhythm.     Heart sounds: Normal heart sounds.  Pulmonary:     Effort: Pulmonary effort is normal.     Breath sounds: Normal breath sounds.  Abdominal:     General: Bowel sounds are normal. There is no distension.     Palpations: Abdomen is soft.  Tenderness: There is no abdominal tenderness.  Skin:    General: Skin is warm and dry.  Neurological:     Mental Status: He is alert and oriented to person, place, and time.        CMP Latest Ref Rng & Units 11/27/2018  Glucose 70 - 99 mg/dL 116(H)  BUN 8 - 23 mg/dL 24(H)  Creatinine 0.61 - 1.24 mg/dL 1.83(H)  Sodium 135 - 145 mmol/L 139  Potassium 3.5 - 5.1 mmol/L 4.0  Chloride 98 - 111 mmol/L 108  CO2 22 - 32 mmol/L 25  Calcium 8.9 - 10.3 mg/dL 8.4(L)  Total Protein 6.5 - 8.1 g/dL 5.9(L)  Total Bilirubin 0.3 - 1.2 mg/dL 0.5  Alkaline Phos 38 - 126 U/L 76  AST 15 - 41 U/L 11(L)  ALT 0 - 44 U/L 10   CBC Latest Ref Rng & Units 11/26/2018  WBC 4.0 - 10.5 K/uL 6.2  Hemoglobin 13.0 - 17.0 g/dL 14.6  Hematocrit 39.0 - 52.0 % 46.6  Platelets 150 - 400 K/uL 122(L)    @IMAGES @  Ct Abdomen Pelvis Wo Contrast  Result Date: 11/25/2018 CLINICAL DATA:  Pt c/o abdominal pain to epigastric area that radiates to lower abdomen straight down center of abdomen. Also c/o back pain but describes as all over back. Unsure if chest and back pain are related as pt having difficulty describing both. Has had vomiting. No bowel movement in 2-3 days EXAM: CT ABDOMEN AND PELVIS WITHOUT CONTRAST TECHNIQUE: Multidetector CT imaging of the abdomen and pelvis was performed following the standard protocol without IV contrast. COMPARISON:  10/18/2018 and older exams. FINDINGS: Lower chest: Clear lung bases. Hepatobiliary: Liver is unremarkable. Possible small stones in the gallbladder neck. No gallbladder  wall thickening. No bile duct dilation. Pancreas: Mass head of the pancreas measuring 2.1 x 1.7 cm, without significant change prior CT, as well as an MRI dated 11/16/2017. There is peripancreatic inflammation. The superior head through the tail of the pancreas is mildly thickened with an indistinct margin. Pancreatic duct is irregularly dilated. No pancreatic mass. No fluid collection is seen to suggest a pseudocyst. Spleen: Normal in size without focal abnormality. Adrenals/Urinary Tract: 2.1 cm left adrenal adenoma, stable. Normal right adrenal gland. Kidneys normal in size, orientation and position. No masses, collecting system stones or hydronephrosis. Normal ureters. Bladder is decompressed but otherwise unremarkable. Stomach/Bowel: Lobulated, partly calcified mesenteric mass in the right mid abdomen measures 2.5 x 1.9 x 1.3 cm, unchanged from the prior CT. Stomach is unremarkable. Small bowel and colon are normal in caliber. No wall thickening or inflammation. No evidence of appendicitis. Vascular/Lymphatic: Aortic atherosclerotic calcifications. No aneurysm. There are prominent and enlarged peripancreatic lymph nodes, largest measuring 1.4 cm in short axis, between the pancreatic head and duodenum. Reproductive: Unremarkable. Other: No abdominal wall hernia.  No ascites. Musculoskeletal: No fracture or acute finding. No osteoblastic or osteolytic lesions. 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 IMPRESSION: 1. Findings consistent with acute pancreatitis. 2. 2.1 cm head of the pancreas mass is stable from the prior exam and abdomen MRI, 11/16/2017. 3. Partly calcified mesenteric mass is also stable. 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 4. Peripancreatic adenopathy, similar to the prior CT. 5. Left adrenal adenoma, stable. 6. Aortic atherosclerosis. Electronically Signed   By: Lajean Manes M.D.   On: 11/25/2018 18:13   Mr  Abdomen Mrcp Wo Contrast  Result Date: 11/27/2018 CLINICAL DATA:  Abdominal pain for 2  days, radiating into the back. History of metastatic carcinoid tumor, chronic kidney disease, diabetes and hypertension. EXAM: MRI ABDOMEN WITHOUT CONTRAST  (INCLUDING MRCP) TECHNIQUE: Multiplanar multisequence MR imaging of the abdomen was performed. Heavily T2-weighted images of the biliary and pancreatic ducts were obtained, and three-dimensional MRCP images were rendered by post processing. COMPARISON:  Abdominal CT 10/28/2018 and 11/25/2018. Abdominal MRI 11/16/2017. FINDINGS: Lower chest:  The visualized lower chest appears unremarkable. Hepatobiliary: The liver is normal in signal without steatosis or focal abnormality on noncontrast imaging. No evidence of gallstones or gallbladder wall thickening. There is no extrahepatic biliary dilatation or evidence of choledocholithiasis. There is a 9 mm cystic structure within the porta hepatis, adjacent to the left intrahepatic bile duct. On the MRCP images, this may communicate with the duct. This appears unchanged from the previous MRI which did not include MRCP images. Pancreas: As seen on recent CTs, there is progressive dilatation of the main pancreatic duct within the pancreatic body and tail, measuring up to 11 mm in diameter. There is associated side duct dilatation, parenchymal edema and surrounding inflammation. Patient has a known suspected mass within the pancreatic head which is not well visualized. There is abrupt transition in the caliber of the main pancreatic duct in the pancreatic head. Spleen: Normal in size without focal abnormality. Adrenals/Urinary Tract: Stable 2.5 cm left adrenal adenoma with signal dropout on the out of phase gradient echo images. The right adrenal gland appears normal. Small bilateral renal cysts. No evidence of renal mass or hydronephrosis. Stomach/Bowel: No evidence of bowel wall thickening, distention or surrounding inflammatory  change. Vascular/Lymphatic: Small lymph nodes in the porta hepatis are similar to previous study. Calcified central mesenteric lesion on CT is incompletely visualized, but grossly stable based on the coronal images. Aortic and branch vessel atherosclerosis, better seen on CT. No evidence of acute vascular findings. Other: Mild mesenteric edema without focal fluid collection. Musculoskeletal: Heterogeneous marrow signal without focally suspicious lesion. No acute osseous findings. IMPRESSION: 1. Progressive dilatation of the main pancreatic duct in the pancreatic body and tail with pancreatic parenchymal edema and surrounding inflammatory changes consistent with pancreatitis in this patient with an elevated serum lipase level. 2. Findings may be related to known suspected mass involving the pancreatic head which is not well seen on this noncontrast study. No obvious tumor progression identified. 3. No evidence of biliary dilatation or choledocholithiasis. Possible cyst or diverticulum involving the intrahepatic left bile duct, not grossly changed from previous MRI of 11/16/2017. 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 Electronically Signed   By: Richardean Sale M.D.   On: 11/27/2018 13:04    Assessment and plan- Patient is a 73 y.o. 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  Well-differentiated neuroendocrine tumor of pancreas which is otherwise asymptomatic is  unlikely to shrink significantly with octreotide and even if it is the putative cause of his pancreatitis it may not prevent future episodes of pancreatitis.  Of note patient had some features of chronic pancreatitis  and his prior scans but has not had any known admissions for acute pancreatitis and this is his first episode.  If pancreatic stenting is not feasible as determined by [facility], I will consider referring him to [facility] to see if laparoscopy resection of this pancreatic head mass is a possibility  I will follow-up with the patient as an outpatient a week after discharge   Thank you for this kind referral and the opportunity to participate in the care of this patient   Visit Diagnosis 1. Epigastric pain   2. Acute pancreatitis, unspecified complication status, unspecified pancreatitis type     Dr. Randa Evens, MD, MPH [facility] at [facility] [phone] 11/27/2018  5:07 PM

## 2018-11-27 NOTE — Progress Notes (Signed)
[facility] [place] NAME: [name]    MR#:  [id number]  DATE OF BIRTH:  16-Feb-1946  SUBJECTIVE:   Patient still has some epigastric abdominal pain radiating to his back but no nausea or vomiting.  Tolerating clear liquids well.  Lipase is slightly more elevated today.  REVIEW OF SYSTEMS:    Review of Systems  Constitutional: Negative for chills and fever.  HENT: Negative for congestion and tinnitus.   Eyes: Negative for blurred vision and double vision.  Respiratory: Negative for cough, shortness of breath and wheezing.   Cardiovascular: Negative for chest pain, orthopnea and PND.  Gastrointestinal: Positive for abdominal pain. Negative for diarrhea, nausea and vomiting.  Genitourinary: Negative for dysuria and hematuria.  Neurological: Negative for dizziness, sensory change and focal weakness.  All other systems reviewed and are negative.   Nutrition: Clear Liquids Tolerating Diet: Yes Tolerating PT: Ambulatory   DRUG ALLERGIES:   Allergies  Allergen Reactions  . Levaquin [Levofloxacin In D5w] Swelling and Rash    VITALS:  Blood pressure 128/86, pulse 83, temperature 98.5 F (36.9 C), temperature source Oral, resp. rate 18, height 5\' 7"  (1.702 m), weight 85.3 kg, SpO2 97 %.  PHYSICAL EXAMINATION:   Physical Exam  GENERAL:  73 y.o.-year-old patient lying in bed in no acute distress.  EYES: Pupils equal, round, reactive to light and accommodation. No scleral icterus. Extraocular muscles intact.  HEENT: Head atraumatic, normocephalic. Oropharynx and nasopharynx clear.  NECK:  Supple, no jugular venous distention. No thyroid enlargement, no tenderness.  LUNGS: Normal breath sounds bilaterally, no wheezing, rales, rhonchi. No use of accessory muscles of respiration.  CARDIOVASCULAR: S1, S2 normal. No murmurs, rubs, or gallops.  ABDOMEN: Soft, mid abdomen/epigastric abdominal pain but no rebound, rigidity,  nondistended. Bowel sounds present. No organomegaly or mass.  EXTREMITIES: No cyanosis, clubbing or edema b/l.    NEUROLOGIC: Cranial nerves II through XII are intact. No focal Motor or sensory deficits b/l.   PSYCHIATRIC: The patient is alert and oriented x 3.  SKIN: No obvious rash, lesion, or ulcer.    LABORATORY PANEL:   CBC Recent Labs  Lab 11/26/18 0504  WBC 6.2  HGB 14.6  HCT 46.6  PLT 122*   ------------------------------------------------------------------------------------------------------------------  Chemistries  Recent Labs  Lab 11/27/18 0410  NA 139  K 4.0  CL 108  CO2 25  GLUCOSE 116*  BUN 24*  CREATININE 1.83*  CALCIUM 8.4*  AST 11*  ALT 10  ALKPHOS 76  BILITOT 0.5   ------------------------------------------------------------------------------------------------------------------  Cardiac Enzymes Recent Labs  Lab 11/25/18 1719  TROPONINI <0.03   ------------------------------------------------------------------------------------------------------------------  RADIOLOGY:  Ct Abdomen Pelvis Wo Contrast  Result Date: 11/25/2018 CLINICAL DATA:  Pt c/o abdominal pain to epigastric area that radiates to lower abdomen straight down center of abdomen. Also c/o back pain but describes as all over back. Unsure if chest and back pain are related as pt having difficulty describing both. Has had vomiting. No bowel movement in 2-3 days EXAM: CT ABDOMEN AND PELVIS WITHOUT CONTRAST TECHNIQUE: Multidetector CT imaging of the abdomen and pelvis was performed following the standard protocol without IV contrast. COMPARISON:  10/18/2018 and older exams. FINDINGS: Lower chest: Clear lung bases. Hepatobiliary: Liver is unremarkable. Possible small stones in the gallbladder neck. No gallbladder wall thickening. No bile duct dilation. Pancreas: Mass head of the pancreas measuring 2.1 x 1.7 cm, without significant change prior CT, as well as an MRI dated 11/16/2017.  There is  peripancreatic inflammation. The superior head through the tail of the pancreas is mildly thickened with an indistinct margin. Pancreatic duct is irregularly dilated. No pancreatic mass. No fluid collection is seen to suggest a pseudocyst. Spleen: Normal in size without focal abnormality. Adrenals/Urinary Tract: 2.1 cm left adrenal adenoma, stable. Normal right adrenal gland. Kidneys normal in size, orientation and position. No masses, collecting system stones or hydronephrosis. Normal ureters. Bladder is decompressed but otherwise unremarkable. Stomach/Bowel: Lobulated, partly calcified mesenteric mass in the right mid abdomen measures 2.5 x 1.9 x 1.3 cm, unchanged from the prior CT. Stomach is unremarkable. Small bowel and colon are normal in caliber. No wall thickening or inflammation. No evidence of appendicitis. Vascular/Lymphatic: Aortic atherosclerotic calcifications. No aneurysm. There are prominent and enlarged peripancreatic lymph nodes, largest measuring 1.4 cm in short axis, between the pancreatic head and duodenum. Reproductive: Unremarkable. Other: No abdominal wall hernia.  No ascites. Musculoskeletal: No fracture or acute finding. No osteoblastic or osteolytic lesions. 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 IMPRESSION: 1. Findings consistent with acute pancreatitis. 2. 2.1 cm head of the pancreas mass is stable from the prior exam and abdomen MRI, 11/16/2017. 3. Partly calcified mesenteric mass is also stable. 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 4. Peripancreatic adenopathy, similar to the prior CT. 5. Left adrenal adenoma, stable. 6. Aortic atherosclerosis. Electronically Signed   By: Lajean Manes M.D.   On: 11/25/2018 18:13   Mr Abdomen Mrcp Wo Contrast  Result Date: 11/27/2018 CLINICAL DATA:  Abdominal pain for 2 days, radiating into the back. History of metastatic carcinoid tumor, chronic kidney disease,  diabetes and hypertension. EXAM: MRI ABDOMEN WITHOUT CONTRAST  (INCLUDING MRCP) TECHNIQUE: Multiplanar multisequence MR imaging of the abdomen was performed. Heavily T2-weighted images of the biliary and pancreatic ducts were obtained, and three-dimensional MRCP images were rendered by post processing. COMPARISON:  Abdominal CT 10/28/2018 and 11/25/2018. Abdominal MRI 11/16/2017. FINDINGS: Lower chest:  The visualized lower chest appears unremarkable. Hepatobiliary: The liver is normal in signal without steatosis or focal abnormality on noncontrast imaging. No evidence of gallstones or gallbladder wall thickening. There is no extrahepatic biliary dilatation or evidence of choledocholithiasis. There is a 9 mm cystic structure within the porta hepatis, adjacent to the left intrahepatic bile duct. On the MRCP images, this may communicate with the duct. This appears unchanged from the previous MRI which did not include MRCP images. Pancreas: As seen on recent CTs, there is progressive dilatation of the main pancreatic duct within the pancreatic body and tail, measuring up to 11 mm in diameter. There is associated side duct dilatation, parenchymal edema and surrounding inflammation. Patient has a known suspected mass within the pancreatic head which is not well visualized. There is abrupt transition in the caliber of the main pancreatic duct in the pancreatic head. Spleen: Normal in size without focal abnormality. Adrenals/Urinary Tract: Stable 2.5 cm left adrenal adenoma with signal dropout on the out of phase gradient echo images. The right adrenal gland appears normal. Small bilateral renal cysts. No evidence of renal mass or hydronephrosis. Stomach/Bowel: No evidence of bowel wall thickening, distention or surrounding inflammatory change. Vascular/Lymphatic: Small lymph nodes in the porta hepatis are similar to previous study. Calcified central mesenteric lesion on CT is incompletely visualized, but grossly stable  based on the coronal images. Aortic and branch vessel atherosclerosis, better seen on CT. No evidence of acute vascular findings. Other: Mild mesenteric edema without focal fluid  collection. Musculoskeletal: Heterogeneous marrow signal without focally suspicious lesion. No acute osseous findings. IMPRESSION: 1. Progressive dilatation of the main pancreatic duct in the pancreatic body and tail with pancreatic parenchymal edema and surrounding inflammatory changes consistent with pancreatitis in this patient with an elevated serum lipase level. 2. Findings may be related to known suspected mass involving the pancreatic head which is not well seen on this noncontrast study. No obvious tumor progression identified. 3. No evidence of biliary dilatation or choledocholithiasis. Possible cyst or diverticulum involving the intrahepatic left bile duct, not grossly changed from previous MRI of 11/16/2017. 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 Electronically Signed   By: Richardean Sale M.D.   On: 11/27/2018 13:04     ASSESSMENT AND PLAN:   73 year old male with past medical history of recently diagnosed metastatic carcinoid tumor currently not on treatment, diabetes, atrial fibrillation, hyperlipidemia, hypothyroidism who presented to the hospital due to abdominal pain and nausea.  1.  Acute pancreatitis- cause of patient's abdominal pain and nausea.  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 -Lipase level was trending up and therefore discussed with gastroenterology who recommended getting an MRCP.  MRCP showing no cholelithiasis or cholelithiasis.  As per GI patient's source of pancreatitis is likely to the pancreatic mass.  Patient may possibly benefit from a pancreatic stent but not urgently.  -Continue supportive care with IV fluids, antiemetics, pain control.  Will advance diet to full liquids and monitor.   2.  Carcinoid  tumor-recently diagnosed, currently followed by oncology but not on treatment. - will get Oncology consult (discussed with Dr. Janese Banks).   3. A. Fib- rate controlled, continue Cardizem.  Continue Eliquis.  4.  CKD stage IV-creatinine is close to baseline we will continue to monitor. -May need outpatient nephrology follow-up.  5.  Essential hypertension-continue Toprol, losartan.  6.  Hypothyroidism-continue Synthroid.  7.  Hyperlipidemia-continue Crestor.     All the records are reviewed and case discussed with Care Management/Social Worker. Management plans discussed with the patient, family and they are in agreement.  CODE STATUS: Full code  DVT Prophylaxis: Eliquis  TOTAL TIME TAKING CARE OF THIS PATIENT: 30 minutes.   POSSIBLE D/C IN 1-2 DAYS, DEPENDING ON CLINICAL CONDITION.   Henreitta Leber M.D on 11/27/2018 at 2:05 PM  Between 7am to 6pm - Pager - [phone]  After 6pm go to [email or web address] - Proofreader  [facility]  Office  [phone]  CC: Primary care physician; [name], MD

## 2018-11-27 NOTE — Progress Notes (Signed)
tmor board

## 2018-11-28 LAB — COMPREHENSIVE METABOLIC PANEL
ALT: 9 U/L (ref 0–44)
AST: 11 U/L — ABNORMAL LOW (ref 15–41)
Albumin: 3.3 g/dL — ABNORMAL LOW (ref 3.5–5.0)
Alkaline Phosphatase: 73 U/L (ref 38–126)
Anion gap: 5 (ref 5–15)
BUN: 18 mg/dL (ref 8–23)
CO2: 23 mmol/L (ref 22–32)
Calcium: 8.4 mg/dL — ABNORMAL LOW (ref 8.9–10.3)
Chloride: 109 mmol/L (ref 98–111)
Creatinine, Ser: 1.63 mg/dL — ABNORMAL HIGH (ref 0.61–1.24)
GFR calc Af Amer: 47 mL/min — ABNORMAL LOW (ref >60)
GFR calc non Af Amer: 41 mL/min — ABNORMAL LOW (ref >60)
Glucose, Bld: 162 mg/dL — ABNORMAL HIGH (ref 70–99)
Potassium: 3.8 mmol/L (ref 3.5–5.1)
SODIUM: 137 mmol/L (ref 135–145)
Total Bilirubin: 0.4 mg/dL (ref 0.3–1.2)
Total Protein: 5.6 g/dL — ABNORMAL LOW (ref 6.5–8.1)

## 2018-11-28 LAB — IGG 4: IGG 4: 11 mg/dL (ref 2–96)

## 2018-11-28 LAB — LIPASE, BLOOD: Lipase: 532 U/L — ABNORMAL HIGH (ref 11–51)

## 2018-11-28 LAB — GLUCOSE, CAPILLARY
Glucose-Capillary: 109 mg/dL — ABNORMAL HIGH (ref 70–99)
Glucose-Capillary: 265 mg/dL — ABNORMAL HIGH (ref 70–99)

## 2018-11-28 MED ORDER — BISACODYL 5 MG PO TBEC
10.0000 mg | DELAYED_RELEASE_TABLET | Freq: Every day | ORAL | Status: DC | PRN
  Filled 2018-11-28 (×2): qty 2

## 2018-11-28 MED ORDER — HYDROCODONE-ACETAMINOPHEN 5-325 MG PO TABS: 1.0000 | ORAL_TABLET | ORAL | 0 refills | Status: DC | PRN

## 2018-11-28 MED ORDER — POLYETHYLENE GLYCOL 3350 17 G PO PACK
17.0000 g | PACK | Freq: Every day | ORAL | Status: DC | PRN
  Administered 2018-11-28: 13:00:00 17 g via ORAL
  Filled 2018-11-28: qty 1

## 2018-11-28 NOTE — Care Management Important Message (Signed)
Important Message  Patient Details  Name: [name] MRN: [id number] Date of Birth: 06/01/1946   Medicare Important Message Given:  Yes    Juliann Pulse A Seung Nidiffer 11/28/2018, 11:11 AM

## 2018-11-28 NOTE — Progress Notes (Signed)
[name] , MD [address] [place], [place], [place], [address] [id number] [place], [place], [place], [address] Phone: [phone]  Fax: [phone]   [name] is being followed for acute pancreatisi   Subjective: Doing well no complaints    Objective: Vital signs in last 24 hours: Vitals:   11/27/18 1610 11/27/18 1936 11/28/18 0357 11/28/18 0923  BP: 109/73 123/83 112/65 136/89  Pulse: 86 88 86   Resp: 20 19 19    Temp: 98.1 F (36.7 C) 98.2 F (36.8 C) 98.3 F (36.8 C)   TempSrc: Oral Oral    SpO2: 97% 98% 97%   Weight:      Height:       Weight change:   Intake/Output Summary (Last 24 hours) at 11/28/2018 0946 Last data filed at 11/28/2018 0746 Gross per 24 hour  Intake 2293.03 ml  Output 1100 ml  Net 1193.03 ml     Exam: Heart:: Regular rate and rhythm, S1S2 present or without murmur or extra heart sounds Lungs: normal, clear to auscultation and clear to auscultation and percussion Abdomen: soft, nontender, normal bowel sounds   Lab Results: @LABTEST2 @ Micro Results: No results found for this or any previous visit (from the past 240 hour(s)). Studies/Results: Mr Abdomen Mrcp Wo Contrast  Result Date: 11/27/2018 CLINICAL DATA:  Abdominal pain for 2 days, radiating into the back. History of metastatic carcinoid tumor, chronic kidney disease, diabetes and hypertension. EXAM: MRI ABDOMEN WITHOUT CONTRAST  (INCLUDING MRCP) TECHNIQUE: Multiplanar multisequence MR imaging of the abdomen was performed. Heavily T2-weighted images of the biliary and pancreatic ducts were obtained, and three-dimensional MRCP images were rendered by post processing. COMPARISON:  Abdominal CT 10/28/2018 and 11/25/2018. Abdominal MRI 11/16/2017. FINDINGS: Lower chest:  The visualized lower chest appears unremarkable. Hepatobiliary: The liver is normal in signal without steatosis or focal abnormality on noncontrast imaging. No evidence of gallstones or gallbladder  wall thickening. There is no extrahepatic biliary dilatation or evidence of choledocholithiasis. There is a 9 mm cystic structure within the porta hepatis, adjacent to the left intrahepatic bile duct. On the MRCP images, this may communicate with the duct. This appears unchanged from the previous MRI which did not include MRCP images. Pancreas: As seen on recent CTs, there is progressive dilatation of the main pancreatic duct within the pancreatic body and tail, measuring up to 11 mm in diameter. There is associated side duct dilatation, parenchymal edema and surrounding inflammation. Patient has a known suspected mass within the pancreatic head which is not well visualized. There is abrupt transition in the caliber of the main pancreatic duct in the pancreatic head. Spleen: Normal in size without focal abnormality. Adrenals/Urinary Tract: Stable 2.5 cm left adrenal adenoma with signal dropout on the out of phase gradient echo images. The right adrenal gland appears normal. Small bilateral renal cysts. No evidence of renal mass or hydronephrosis. Stomach/Bowel: No evidence of bowel wall thickening, distention or surrounding inflammatory change. Vascular/Lymphatic: Small lymph nodes in the porta hepatis are similar to previous study. Calcified central mesenteric lesion on CT is incompletely visualized, but grossly stable based on the coronal images. Aortic and branch vessel atherosclerosis, better seen on CT. No evidence of acute vascular findings. Other: Mild mesenteric edema without focal fluid collection. Musculoskeletal: Heterogeneous marrow signal without focally suspicious lesion. No acute osseous findings. IMPRESSION: 1. Progressive dilatation of the main pancreatic duct in the pancreatic body and tail with pancreatic parenchymal edema and surrounding inflammatory changes consistent with pancreatitis in this  patient with an elevated serum lipase level. 2. Findings may be related to known suspected mass  involving the pancreatic head which is not well seen on this noncontrast study. No obvious tumor progression identified. 3. No evidence of biliary dilatation or choledocholithiasis. Possible cyst or diverticulum involving the intrahepatic left bile duct, not grossly changed from previous MRI of 11/16/2017. 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 Electronically Signed   By: Richardean Sale M.D.   On: 11/27/2018 13:04   Medications: I have reviewed the patient's current medications. Scheduled Meds: . apixaban  5 mg Oral BID  . diltiazem  180 mg Oral Daily  . insulin aspart  0-5 Units Subcutaneous QHS  . insulin aspart  0-9 Units Subcutaneous TID WC  . levothyroxine  50 mcg Oral QAC breakfast  . losartan  25 mg Oral Daily  . metoprolol succinate  50 mg Oral QPM  . mometasone-formoterol  2 puff Inhalation BID  . rosuvastatin  40 mg Oral Daily  . tiotropium  1 capsule Inhalation Daily  . vitamin B-12  500 mcg Oral Daily   Continuous Infusions: . sodium chloride 100 mL/hr at 11/28/18 0253   PRN Meds:.acetaminophen **OR** acetaminophen, albuterol, HYDROcodone-acetaminophen, morphine injection, ondansetron **OR** ondansetron (ZOFRAN) IV   Assessment: Active Problems:   Acute pancreatitis  [name] is a 73 y.o. y/o male with a history of metastatic neuroendocrine tumor.  Admitted to the hospital with acute abdominal pain.  He has biochemical and radiological evidence of acute pancreatitis.  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  MRCP shows progression of pancreatic ductal dilation. 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   Discussed with Dr Janese Banks , since he is improving with conservative management , suggest discussion at the tumor board where [facility] advanced endoscopy will be present to determine if he would benefit from stenting of the pancreatic duct. It is possible that the pancreatic mass is causing compression and leading to pancreatitis. If tolerated oral  diet adequately with no pain or vomiting can go home and follow up with Oncology.   No further inpatient recommendations, suggest advance diet as tolerated.   I will sign off.  Please call me if any further GI concerns or questions.  We would like to thank you for the opportunity to participate in the care of [name].    Thank you for involving me in the care of this patient.     LOS: 3 days   Carlos Bellows, MD 11/28/2018, 9:46 AM

## 2018-11-28 NOTE — Plan of Care (Signed)
Pt d/ced home.  Pt's pain is tolerable. He tolerated advanced diet.   He didn't need any pain medicine today but we are sending him home w/a script.  Pt will f/u outpt w/Dr. Janese Banks. She will consult her tumor board. Pt would have to go to [facility] if they recommend stent placement.  Will remove IV and review d/c instructions and f/u appts.  Pt's friend will transport him home.

## 2018-11-28 NOTE — Discharge Summary (Signed)
[facility] [place] NAME: [name]    MR#:  [id number]  DATE OF BIRTH:  11-Feb-1946  DATE OF ADMISSION:  11/25/2018 ADMITTING PHYSICIAN: Dustin Flock, MD  DATE OF DISCHARGE: No discharge date for patient encounter.  PRIMARY CARE PHYSICIAN: [name], MD    ADMISSION DIAGNOSIS:  Epigastric pain [R10.13] Acute pancreatitis, unspecified complication status, unspecified pancreatitis type [K85.90]  DISCHARGE DIAGNOSIS:  Active Problems:   Acute pancreatitis   SECONDARY DIAGNOSIS:   Past Medical History:  Diagnosis Date  . Atrial flutter ([place])    a. s/p ablation 09/2017.  . CKD (chronic kidney disease), stage III ([place])   . COPD (chronic obstructive pulmonary disease) ([place])   . Diabetes mellitus without complication ([place])   . Hyperlipidemia   . Hypertension   . Polycythemia     HOSPITAL COURSE:   73 year old male with past medical history of recently diagnosed metastatic carcinoid tumor currently not on treatment, diabetes, atrial fibrillation, hyperlipidemia, hypothyroidism who presented to the hospital due to abdominal pain and nausea.  1.  Acute pancreatitis- cause of patient's abdominal pain and nausea.  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 -Patient was treated supportively with IV fluids, antiemetics, pain control.  A gastroenterology consult was obtained.  Patient underwent an MRCP which showed no cholelithiasis or choledocholithiasis.  As per GI the patient source of pancreatitis was likely secondary to the pancreatic mass which is from his underlying neuroendocrine tumor.  Oncology did not recommend treatment of the tumor at this time. -Patient's diet was slowly advanced from a clear to a full and then to a low-fat diet which he is tolerating any worsening abdominal pain, nausea or vomiting.  He will be discharged home with outpatient follow-up  with oncology.  Oncology to discuss this case at tumor board and to have him evaluated by a advanced endoscopist for possible need for pancreatic stent.   2.  Carcinoid tumor-recently diagnosed, currently followed by oncology but not on treatment. -Patient was seen by oncology during hospitalization, they will discuss the case at tumor board this week and consider possible pancreatic stent placement given his symptoms as mentioned above.  This is to be done by advanced endoscopist from [facility].  Further care is as per oncology.  3. A. Fib- rate controlled, pt. Will continue Cardizem.  Continue Eliquis.  4.  Acute on CKD stage IV- which improved with IV fluid hydration, patient presented to the hospital with a creatinine of 2.3 and now it is down to 1.6.  This can be further followed as an outpatient.  5.  Essential hypertension-pt. Will continue Toprol, losartan.  6.  Hypothyroidism-pt. Will continue Synthroid.  7.  Hyperlipidemia-pt. Will continue Crestor.   DISCHARGE CONDITIONS:   Stable.   CONSULTS OBTAINED:  Treatment Team:  [name], MD Sindy Guadeloupe, MD  DRUG ALLERGIES:   Allergies  Allergen Reactions  . Levaquin [Levofloxacin In D5w] Swelling and Rash    DISCHARGE MEDICATIONS:   Allergies as of 11/28/2018      Reactions   Levaquin [levofloxacin In D5w] Swelling, Rash      Medication List    TAKE these medications   acetaminophen 500 MG tablet Commonly known as:  TYLENOL Take 500-1,000 mg by mouth every 6 (six) hours as needed for moderate pain or headache.   apixaban 5 MG Tabs tablet Commonly known as:  ELIQUIS Take 1 tablet (5  mg total) by mouth 2 (two) times daily.   budesonide-formoterol 80-4.5 MCG/ACT inhaler Commonly known as:  SYMBICORT Inhale 1 puff into the lungs 2 times daily at 12 noon and 4 pm.   diltiazem 180 MG 24 hr capsule Commonly known as:  CARDIZEM CD Take 180 mg by mouth daily.   guaiFENesin-codeine 100-10 MG/5ML syrup Take 5  mLs by mouth every 4 (four) hours as needed for cough.   HYDROcodone-acetaminophen 5-325 MG tablet Commonly known as:  NORCO/VICODIN Take 1-2 tablets by mouth every 4 (four) hours as needed for moderate pain.   JANUMET XR 50-500 MG Tb24 Generic drug:  SitaGLIPtin-MetFORMIN HCl Take 1 tablet by mouth daily.   levothyroxine 50 MCG tablet Commonly known as:  SYNTHROID, LEVOTHROID Take 50 mcg by mouth daily.   losartan 25 MG tablet Commonly known as:  COZAAR Take 1 tablet (25 mg total) by mouth daily.   metoprolol succinate 50 MG 24 hr tablet Commonly known as:  TOPROL-XL Take 1 tablet (50 mg total) by mouth every evening. Take with or immediately following a meal.   PROAIR HFA 108 (90 Base) MCG/ACT inhaler Generic drug:  albuterol Inhale 2 puffs into the lungs every 4 (four) hours as needed for wheezing or shortness of breath.   rosuvastatin 40 MG tablet Commonly known as:  CRESTOR TAKE 1 TABLET BY MOUTH ONCE DAILY   SPIRIVA RESPIMAT 1.25 MCG/ACT Aers Generic drug:  Tiotropium Bromide Monohydrate Inhale 1 puff into the lungs daily.   vitamin B-12 500 MCG tablet Commonly known as:  CYANOCOBALAMIN Take 500 mcg by mouth daily.         DISCHARGE INSTRUCTIONS:   DIET:  Cardiac diet and Diabetic diet  DISCHARGE CONDITION:  Stable  ACTIVITY:  Activity as tolerated  OXYGEN:  Home Oxygen: No.   Oxygen Delivery: room air  DISCHARGE LOCATION:  home   If you experience worsening of your admission symptoms, develop shortness of breath, life threatening emergency, suicidal or homicidal thoughts you must seek medical attention immediately by calling 911 or calling your MD immediately  if symptoms less severe.  You Must read complete instructions/literature along with all the possible adverse reactions/side effects for all the Medicines you take and that have been prescribed to you. Take any new Medicines after you have completely understood and accpet all the possible  adverse reactions/side effects.   Please note  You were cared for by a hospitalist during your hospital stay. If you have any questions about your discharge medications or the care you received while you were in the hospital after you are discharged, you can call the unit and asked to speak with the hospitalist on call if the hospitalist that took care of you is not available. Once you are discharged, your primary care physician will handle any further medical issues. Please note that NO REFILLS for any discharge medications will be authorized once you are discharged, as it is imperative that you return to your primary care physician (or establish a relationship with a primary care physician if you do not have one) for your aftercare needs so that they can reassess your need for medications and monitor your lab values.     Today   STill having some abdominal pain but it is not any worse with eating or any worse since admission.  Patient is tolerating a full liquid diet well, will advance to low-fat and if tolerated will discharge home today.  VITAL SIGNS:  Blood pressure 131/84, pulse 95, temperature  97.6 F (36.4 C), temperature source Oral, resp. rate 20, height 5\' 7"  (1.702 m), weight 85.3 kg, SpO2 96 %.  I/O:    Intake/Output Summary (Last 24 hours) at 11/28/2018 1532 Last data filed at 11/28/2018 1418 Gross per 24 hour  Intake 1245.24 ml  Output 700 ml  Net 545.24 ml    PHYSICAL EXAMINATION:   GENERAL:  73 y.o.-year-old patient lying in bed in no acute distress.  EYES: Pupils equal, round, reactive to light and accommodation. No scleral icterus. Extraocular muscles intact.  HEENT: Head atraumatic, normocephalic. Oropharynx and nasopharynx clear.  NECK:  Supple, no jugular venous distention. No thyroid enlargement, no tenderness.  LUNGS: Normal breath sounds bilaterally, no wheezing, rales, rhonchi. No use of accessory muscles of respiration.  CARDIOVASCULAR: S1, S2 normal. No  murmurs, rubs, or gallops.  ABDOMEN: Soft, mid abdomen/epigastric abdominal pain but no rebound, rigidity, nondistended. Bowel sounds present. No organomegaly or mass.  EXTREMITIES: No cyanosis, clubbing or edema b/l.    NEUROLOGIC: Cranial nerves II through XII are intact. No focal Motor or sensory deficits b/l.   PSYCHIATRIC: The patient is alert and oriented x 3.  SKIN: No obvious rash, lesion, or ulcer.   DATA REVIEW:   CBC Recent Labs  Lab 11/26/18 0504  WBC 6.2  HGB 14.6  HCT 46.6  PLT 122*    Chemistries  Recent Labs  Lab 11/28/18 0251  NA 137  K 3.8  CL 109  CO2 23  GLUCOSE 162*  BUN 18  CREATININE 1.63*  CALCIUM 8.4*  AST 11*  ALT 9  ALKPHOS 73  BILITOT 0.4    Cardiac Enzymes Recent Labs  Lab 11/25/18 1719  TROPONINI <0.03    Microbiology Results  No results found for this or any previous visit.  RADIOLOGY:  Mr Abdomen Mrcp Wo Contrast  Result Date: 11/27/2018 CLINICAL DATA:  Abdominal pain for 2 days, radiating into the back. History of metastatic carcinoid tumor, chronic kidney disease, diabetes and hypertension. EXAM: MRI ABDOMEN WITHOUT CONTRAST  (INCLUDING MRCP) TECHNIQUE: Multiplanar multisequence MR imaging of the abdomen was performed. Heavily T2-weighted images of the biliary and pancreatic ducts were obtained, and three-dimensional MRCP images were rendered by post processing. COMPARISON:  Abdominal CT 10/28/2018 and 11/25/2018. Abdominal MRI 11/16/2017. FINDINGS: Lower chest:  The visualized lower chest appears unremarkable. Hepatobiliary: The liver is normal in signal without steatosis or focal abnormality on noncontrast imaging. No evidence of gallstones or gallbladder wall thickening. There is no extrahepatic biliary dilatation or evidence of choledocholithiasis. There is a 9 mm cystic structure within the porta hepatis, adjacent to the left intrahepatic bile duct. On the MRCP images, this may communicate with the duct. This appears unchanged  from the previous MRI which did not include MRCP images. Pancreas: As seen on recent CTs, there is progressive dilatation of the main pancreatic duct within the pancreatic body and tail, measuring up to 11 mm in diameter. There is associated side duct dilatation, parenchymal edema and surrounding inflammation. Patient has a known suspected mass within the pancreatic head which is not well visualized. There is abrupt transition in the caliber of the main pancreatic duct in the pancreatic head. Spleen: Normal in size without focal abnormality. Adrenals/Urinary Tract: Stable 2.5 cm left adrenal adenoma with signal dropout on the out of phase gradient echo images. The right adrenal gland appears normal. Small bilateral renal cysts. No evidence of renal mass or hydronephrosis. Stomach/Bowel: No evidence of bowel wall thickening, distention or surrounding inflammatory change. Vascular/Lymphatic:  Small lymph nodes in the porta hepatis are similar to previous study. Calcified central mesenteric lesion on CT is incompletely visualized, but grossly stable based on the coronal images. Aortic and branch vessel atherosclerosis, better seen on CT. No evidence of acute vascular findings. Other: Mild mesenteric edema without focal fluid collection. Musculoskeletal: Heterogeneous marrow signal without focally suspicious lesion. No acute osseous findings. IMPRESSION: 1. Progressive dilatation of the main pancreatic duct in the pancreatic body and tail with pancreatic parenchymal edema and surrounding inflammatory changes consistent with pancreatitis in this patient with an elevated serum lipase level. 2. Findings may be related to known suspected mass involving the pancreatic head which is not well seen on this noncontrast study. No obvious tumor progression identified. 3. No evidence of biliary dilatation or choledocholithiasis. Possible cyst or diverticulum involving the intrahepatic left bile duct, not grossly changed from  previous MRI of 11/16/2017. 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 Electronically Signed   By: Richardean Sale M.D.   On: 11/27/2018 13:04      Management plans discussed with the patient, family and they are in agreement.  CODE STATUS:     Code Status Orders  (From admission, onward)         Start     Ordered   11/25/18 1959  Full code  Continuous     11/25/18 1958         TOTAL TIME TAKING CARE OF THIS PATIENT: 40 minutes.    Henreitta Leber M.D on 11/28/2018 at 3:32 PM  Between 7am to 6pm - Pager - [phone]  After 6pm go to [email or web address] - Proofreader  [facility]  Office  [phone]  CC: Primary care physician; [name], MD

## 2018-11-30 ENCOUNTER — Other Ambulatory Visit: Payer: Self-pay

## 2018-11-30 NOTE — Patient Outreach (Signed)
[facility]) Care Management  [place]   11/30/2018  [name] [birth year] [id number]  Reason for referral: Medication Reconciliation Post Discharge  Current insurance:[employer]  PMHx includes but not limited to:  Atrial flutter, hypertension, coronary artery disease, emphysema, COPD, acute pancreatitis   Outreach:  Successful telephone call with [name].  HIPAA identifiers verified.   Subjective:  [name] reports his pain "comes and it goes".  He states he will pick up his Norco today.  He could not have it filled earlier because it had not been signed. He has been taking acetaminophen in the interim for pain. He states that he has trouble affording his medications when he goes in the coverage gap.  He reports that he checks his CBGs every morning with usual values between 130-150 mg/dL.  He states that he has a PCP appointment tomorrow.   Objective: Lab Results  Component Value Date   CREATININE 1.63 (H) 11/28/2018   CREATININE 1.83 (H) 11/27/2018   CREATININE 2.12 (H) 11/26/2018    Lab Results  Component Value Date   HGBA1C 7.0 (H) 10/30/2014    Lipid Panel     Component Value Date/Time   CHOL 79 11/26/2018 0504   TRIG 98 11/26/2018 0504   HDL 32 (L) 11/26/2018 0504   CHOLHDL 2.5 11/26/2018 0504   VLDL 20 11/26/2018 0504   LDLCALC 27 11/26/2018 0504    BP Readings from Last 3 Encounters:  11/28/18 131/84  10/27/18 107/65  09/26/18 121/66    Allergies  Allergen Reactions  . Levaquin [Levofloxacin In D5w] Swelling and Rash    Medications Reviewed Today    Reviewed by Dionne Milo, [facility] (Pharmacist) on 11/30/18 at 1452  Med List Status: <None>  Medication Order Taking? Sig Documenting Provider Last Dose Status Informant  acetaminophen (TYLENOL) 500 MG tablet [id number] Yes Take 500-1,000 mg by mouth every 6 (six) hours as needed for moderate pain or headache.  [provider] Taking  Active Self  albuterol (PROAIR HFA) 108 (90 Base) MCG/ACT inhaler [id number] Yes Inhale 2 puffs into the lungs every 4 (four) hours as needed for wheezing or shortness of breath.  [provider] Taking Active Self  apixaban (ELIQUIS) 5 MG TABS tablet [id number] Yes Take 1 tablet (5 mg total) by mouth 2 (two) times daily. [name], MD Taking Active Self  budesonide-formoterol [facility]) 80-4.5 MCG/ACT inhaler [id number] Yes Inhale 2 puffs into the lungs 2 (two) times daily. Takes at noon and at 4pm. [provider] Taking Active Self  diltiazem (DILTIAZEM CD) 120 MG 24 hr capsule [id number] Yes Take 120 mg by mouth 2 (two) times daily. Verified with [facility]. [name], MD Taking Active Self  HYDROcodone-acetaminophen (NORCO/VICODIN) 5-325 MG tablet [id number] Yes Take 1-2 tablets by mouth every 4 (four) hours as needed for moderate pain. Henreitta Leber, MD Taking Active   JANUMET XR 50-500 MG TB24 [id number] Yes Take 1 tablet by mouth daily. [provider] Taking Active Self  levothyroxine (SYNTHROID, LEVOTHROID) 50 MCG tablet [id number] Yes Take 50 mcg by mouth daily. [provider] Taking Active Self  losartan (COZAAR) 25 MG tablet [id number] Yes Take 1 tablet (25 mg total) by mouth daily. [name], MD Taking Active Self  metoprolol succinate (TOPROL-XL) 50 MG 24 hr tablet [id number] Yes Take 1 tablet (50 mg total) by mouth every evening. Take with or immediately following a meal. Gollan, Kathlene November, MD Taking Active Self  rosuvastatin (CRESTOR) 40 MG tablet [id number] Yes TAKE 1 TABLET BY MOUTH ONCE DAILY  Patient taking differently:  Take 40 mg by mouth daily.    [name], MD Taking Active Self  Tiotropium Bromide Monohydrate (SPIRIVA RESPIMAT) 1.25 MCG/ACT AERS [id number] Yes Inhale 1 puff into the lungs daily.  [provider] Taking Active Self  vitamin B-12 (CYANOCOBALAMIN) 500 MCG tablet [id number] Yes Take 1,000 mcg by  mouth daily.  [provider] Taking Active Self         ASSESSMENT: Date Discharged from Hospital: 11/28/18 Date Medication Reconciliation Performed: 11/30/2018  Medications:  New at Discharge: . Hydrocodone/APAP  Patient was recently discharged from hospital and all medications have been reviewed.  Assessment:  Drugs sorted by system:  Cardiovascular: diltiazem, apixaban, losartan, metoprolol succinate, rosuvastatin  Pulmonary/Allergy: albuterol MDI, budesonide/formoterol, tiotropium  Endocrine: sitagliptin/metformin, levothryoxine  Pain: acetaminophen, hydrocodone/APAP  Vitamins/Minerals/Supplements: vitamin B-12   Medication Assistance Findings:  Medication assistance needs identified.   Extra Help:   _0  Already receiving Full Extra Help  _1  Already receiving Partial Extra Help  _2  Eligible based on reported income and assets  _3  Not Eligible based on reported income and assets  Patient Assistance Programs: 1) Janumet XR made by [employer] o Income requirement met: _4  Yes _5  No _6  Unknown o Out-of-pocket prescription expenditure met:    _7  Yes _8  No  _9  Unknown  <GEXBMWUXLKGMWNUU>_7<\/OZDGUYQIHKVQQVZD>_63  Not applicable - Patient has met application requirements to apply for this patient assistance program.          2)  Spiriva Respimat made by [employer] o Income requirement met: _11  Yes _12  No  _13  Unknown o Out-of-pocket prescription expenditure met:   _14  Yes _15  No   _16  Unknown <OVFIEPPIRJJOACZY>_6<\/AYTKZSWFUXNATFTD>_32  Not applicable - Patient has met application requirements to apply for this patient assistance program.     Patient is aware he will need to spend ~$810 to be eligible to apply for Eliquis and Symbicort patient assistance.   Plan: I will route patient assistance letter to [place] technician who will coordinate patient assistance program application process for medications listed above.  [facility] pharmacy technician will assist with obtaining all required documents from both patient and provider(s) and  submit application(s) once completed.   Route note to Dr. Lavera Guise.  Joetta Manners, PharmD Clinical Pharmacist [place] [phone]

## 2018-12-01 ENCOUNTER — Other Ambulatory Visit: Payer: Self-pay | Admitting: Pharmacy Technician

## 2018-12-01 NOTE — Patient Outreach (Signed)
[facility]) Care Management  12/01/2018  [name] Jul 30, 1946 [id number]                                                  Medication Assistance Referral  Referral From: [facility] RPh Clearnce Sorrel.  Medication/Company: Carlyn Reichert XR / [employer] Patient application portion:  Mailed Provider application portion:  Mailed to Dr. Lavera Guise  Medication/Company: Stann Ore Respimat / [employer] Patient application portion:  Mailed Provider application portion: Faxed  to Dr. Mortimer Fries  Follow up:  Will follow up with patient in 5-7 business days to confirm application(s) have been received.  Maud Deed Chana Bode [place] Certified Pharmacy Technician [place] Management Direct Dial:[phone]

## 2018-12-07 ENCOUNTER — Other Ambulatory Visit: Payer: PPO

## 2018-12-07 NOTE — Progress Notes (Signed)
Tumor Board Documentation  [name] was presented by Dr Janese Banks at our Tumor Board on 12/07/2018, which included representatives from medical oncology, radiation oncology, surgical, internal medicine, navigation, pathology, radiology, research, palliative care, pulmonology, nutrition.  [name] currently presents as a current patient, for discussion with history of the following treatments:  .  Additionally, we reviewed previous medical and familial history, history of present illness, and recent lab results along with all available histopathologic and imaging studies. The tumor board considered available treatment options and made the following recommendations: Active surveillance    The following procedures/referrals were also placed: No orders of the defined types were placed in this encounter.   Clinical Trial Status: not discussed   Staging used:   AJCC National site-specific guidelines  NCCN were discussed with respect to the case.  Tumor board is a meeting of clinicians from various specialty areas who evaluate and discuss patients for whom a multidisciplinary approach is being considered. Final determinations in the plan of care are those of the provider(s). The responsibility for follow up of recommendations given during tumor board is that of the provider.   Today's extended care, comprehensive team conference, [name] was not present for the discussion and was not examined.   Multidisciplinary Tumor Board is a multidisciplinary case peer review process.  Decisions discussed in the Multidisciplinary Tumor Board reflect the opinions of the specialists present at the conference without having examined the patient.  Ultimately, treatment and diagnostic decisions rest with the primary provider(s) and the patient.

## 2018-12-12 ENCOUNTER — Other Ambulatory Visit: Payer: Self-pay | Admitting: Cardiovascular Disease

## 2018-12-19 ENCOUNTER — Other Ambulatory Visit: Payer: Self-pay | Admitting: Pharmacy Technician

## 2018-12-19 NOTE — Patient Outreach (Signed)
[facility]) Care Management  12/19/2018  [name] 1946/09/11 [id number]    Unsuccessful call #1 placed to patient regarding patient assistance application(s) for Spriva Respimat and Janumet XR , HIPAA compliant voicemail left.   Will make 2nd call attempt in 2-3 business days if call has not been returned.  Maud Deed Chana Bode [place] Certified Pharmacy Technician [place] Management Direct [phone]

## 2018-12-22 DIAGNOSIS — D3131 Benign neoplasm of right choroid: Secondary | ICD-10-CM | POA: Diagnosis not present

## 2018-12-25 ENCOUNTER — Other Ambulatory Visit: Payer: Self-pay | Admitting: Pharmacy Technician

## 2018-12-25 NOTE — Patient Outreach (Signed)
[facility]) Care Management  12/25/2018  Treyson Axel 1946/02/05 [id number]    Unsuccessful call #1 placed to patient regarding patient assistance application(s) for Spiriva and Janumet XR , HIPAA compliant voicemail left. Mr. Steers had contacted me back and I had left a message for him less than a week ago.  Will make 2nd call attempt in 2-3 business days if call has not been returned.  Maud Deed Chana Bode [place] Certified Pharmacy Technician [place] Management Direct Dial:[phone]

## 2018-12-26 ENCOUNTER — Other Ambulatory Visit: Payer: Self-pay | Admitting: Pharmacy Technician

## 2018-12-26 NOTE — Patient Outreach (Signed)
[facility]) Care Management  12/26/2018  [name] November 23, 1945 [id number]   Incoming call from patient stating he received patient assistance applications for Janumet XR and Spiriva Respimat. He states he has not filled them out yet. Encouraged him to fill them out because we aren't able to start the process unless I have his portions of the applications. He stated he would.  Will follow up with patient in 14-21 business days if documents have not been received.  Maud Deed Chana Bode [place] Certified Pharmacy Technician [place] Management Direct Dial:[phone]

## 2018-12-28 DIAGNOSIS — R319 Hematuria, unspecified: Secondary | ICD-10-CM | POA: Diagnosis not present

## 2018-12-28 DIAGNOSIS — R809 Proteinuria, unspecified: Secondary | ICD-10-CM | POA: Diagnosis not present

## 2018-12-28 DIAGNOSIS — N2889 Other specified disorders of kidney and ureter: Secondary | ICD-10-CM | POA: Diagnosis not present

## 2018-12-28 DIAGNOSIS — N184 Chronic kidney disease, stage 4 (severe): Secondary | ICD-10-CM | POA: Diagnosis not present

## 2019-01-24 DIAGNOSIS — I129 Hypertensive chronic kidney disease with stage 1 through stage 4 chronic kidney disease, or unspecified chronic kidney disease: Secondary | ICD-10-CM | POA: Diagnosis not present

## 2019-01-24 DIAGNOSIS — R809 Proteinuria, unspecified: Secondary | ICD-10-CM | POA: Diagnosis not present

## 2019-01-24 DIAGNOSIS — N179 Acute kidney failure, unspecified: Secondary | ICD-10-CM | POA: Diagnosis not present

## 2019-01-24 DIAGNOSIS — E1122 Type 2 diabetes mellitus with diabetic chronic kidney disease: Secondary | ICD-10-CM | POA: Diagnosis not present

## 2019-01-24 DIAGNOSIS — N183 Chronic kidney disease, stage 3 (moderate): Secondary | ICD-10-CM | POA: Diagnosis not present

## 2019-01-24 DIAGNOSIS — R319 Hematuria, unspecified: Secondary | ICD-10-CM | POA: Diagnosis not present

## 2019-02-01 DIAGNOSIS — E1165 Type 2 diabetes mellitus with hyperglycemia: Secondary | ICD-10-CM | POA: Diagnosis not present

## 2019-02-08 DIAGNOSIS — N184 Chronic kidney disease, stage 4 (severe): Secondary | ICD-10-CM | POA: Diagnosis not present

## 2019-02-08 DIAGNOSIS — E785 Hyperlipidemia, unspecified: Secondary | ICD-10-CM | POA: Diagnosis not present

## 2019-02-08 DIAGNOSIS — E1122 Type 2 diabetes mellitus with diabetic chronic kidney disease: Secondary | ICD-10-CM | POA: Diagnosis not present

## 2019-02-08 DIAGNOSIS — E1165 Type 2 diabetes mellitus with hyperglycemia: Secondary | ICD-10-CM | POA: Diagnosis not present

## 2019-02-08 DIAGNOSIS — Z794 Long term (current) use of insulin: Secondary | ICD-10-CM | POA: Diagnosis not present

## 2019-02-19 ENCOUNTER — Other Ambulatory Visit: Payer: Self-pay | Admitting: Cardiovascular Disease

## 2019-02-19 NOTE — Telephone Encounter (Signed)
Please review for refill Diltiazem is listed in medications.

## 2019-02-20 ENCOUNTER — Telehealth: Payer: Self-pay | Admitting: *Deleted

## 2019-02-20 MED ORDER — DILTIAZEM HCL ER COATED BEADS 120 MG PO CP24: 120.0000 mg | ORAL_CAPSULE | Freq: Two times a day (BID) | ORAL | 1 refills | Status: DC

## 2019-02-20 NOTE — Telephone Encounter (Signed)
Pt called back to verify that he is taking diltiazem 120 mg tablet. I refilled pt's medication and sent it to the pharmacy as requested. Confirmation received.

## 2019-02-20 NOTE — Telephone Encounter (Signed)
Pt's medication was sent to pt's pharmacy as requested. Confirmation received.  °

## 2019-02-20 NOTE — Telephone Encounter (Signed)
Left message for patient to call [facility] in [place] to verify Diltilazem 180 mg dosage that he is taking, per Alvis Lemmings, RN.

## 2019-02-20 NOTE — Telephone Encounter (Signed)
Do you mind calling the patient to confirm what dose of diltilazem he is taking?  Per 09/26/18 OV with Dr. Rockey Situ - taking diltazem 180 mg once daily  Per 11/28/18 discharge summary- taking diltiazem 180 mg once daily  Per 11/30/18 [facility] pharmacy note w/ Marnee Guarneri, Pharmacy- taking diltiazem 120 mg BID  I'm not sure where the change in dose would have come in.   Thanks!

## 2019-02-21 NOTE — Telephone Encounter (Signed)
LMOV for patient to call back.

## 2019-02-22 ENCOUNTER — Telehealth: Payer: Self-pay

## 2019-02-22 NOTE — Telephone Encounter (Signed)
ERROR

## 2019-03-12 ENCOUNTER — Other Ambulatory Visit: Payer: Self-pay | Admitting: Cardiovascular Disease

## 2019-03-12 NOTE — Telephone Encounter (Signed)
Pt's age 73, wt 85.3 kg, serum creatinine 1.63, CrCl 48.14.

## 2019-03-12 NOTE — Telephone Encounter (Signed)
Please review for refill.  

## 2019-03-23 ENCOUNTER — Other Ambulatory Visit: Payer: Self-pay | Admitting: Pharmacy Technician

## 2019-03-23 NOTE — Patient Outreach (Signed)
[facility]) Care Management  03/23/2019  [name] [birth year] [id number]   Per last conversation with patient. He stated he had not filled out patient assistance application for [employer] and Spiriva however he would and mail documents back. Have not received documents.  Patient has since started being followed by [facility].  Will route note to [place] for case closure.  Maud Deed Chana Bode [place] Certified Pharmacy Technician [place] Management Direct Dial:[phone]

## 2019-03-30 ENCOUNTER — Other Ambulatory Visit: Payer: Self-pay | Admitting: Pharmacist

## 2019-03-30 NOTE — Patient Outreach (Signed)
[facility]) Care Management  03/30/2019  [name] 1946-02-10 [id number]  Received message from pharmacy technician, Caryl Pina, patient has not yet returned patient assistance applications.  See note from 03/23/2019 for details.    Plan:   Case closed at this time.    Karrie Meres, PharmD, [place] [phone]

## 2019-04-18 DIAGNOSIS — E1129 Type 2 diabetes mellitus with other diabetic kidney complication: Secondary | ICD-10-CM | POA: Diagnosis not present

## 2019-04-18 DIAGNOSIS — N184 Chronic kidney disease, stage 4 (severe): Secondary | ICD-10-CM | POA: Diagnosis not present

## 2019-04-18 DIAGNOSIS — I10 Essential (primary) hypertension: Secondary | ICD-10-CM | POA: Diagnosis not present

## 2019-04-18 DIAGNOSIS — R809 Proteinuria, unspecified: Secondary | ICD-10-CM | POA: Diagnosis not present

## 2019-04-23 DIAGNOSIS — N2581 Secondary hyperparathyroidism of renal origin: Secondary | ICD-10-CM | POA: Diagnosis not present

## 2019-04-23 DIAGNOSIS — E1122 Type 2 diabetes mellitus with diabetic chronic kidney disease: Secondary | ICD-10-CM | POA: Diagnosis not present

## 2019-04-23 DIAGNOSIS — N183 Chronic kidney disease, stage 3 (moderate): Secondary | ICD-10-CM | POA: Diagnosis not present

## 2019-04-23 DIAGNOSIS — R809 Proteinuria, unspecified: Secondary | ICD-10-CM | POA: Diagnosis not present

## 2019-04-23 DIAGNOSIS — I129 Hypertensive chronic kidney disease with stage 1 through stage 4 chronic kidney disease, or unspecified chronic kidney disease: Secondary | ICD-10-CM | POA: Diagnosis not present

## 2019-04-24 ENCOUNTER — Other Ambulatory Visit: Payer: Self-pay

## 2019-04-24 ENCOUNTER — Ambulatory Visit
Admission: RE | Admit: 2019-04-24 | Discharge: 2019-04-24 | Disposition: A | Discharge status: Home / Self Care | Payer: PPO | Source: Ambulatory Visit | Attending: Oncology | Admitting: Oncology

## 2019-04-24 DIAGNOSIS — R590 Localized enlarged lymph nodes: Secondary | ICD-10-CM | POA: Diagnosis not present

## 2019-04-24 DIAGNOSIS — K869 Disease of pancreas, unspecified: Secondary | ICD-10-CM | POA: Insufficient documentation

## 2019-04-24 DIAGNOSIS — K639 Disease of intestine, unspecified: Secondary | ICD-10-CM | POA: Diagnosis not present

## 2019-04-24 DIAGNOSIS — C7B00 Secondary carcinoid tumors, unspecified site: Secondary | ICD-10-CM

## 2019-04-24 DIAGNOSIS — C801 Malignant (primary) neoplasm, unspecified: Secondary | ICD-10-CM | POA: Diagnosis not present

## 2019-04-25 ENCOUNTER — Ambulatory Visit: Payer: PPO

## 2019-04-27 ENCOUNTER — Ambulatory Visit: Payer: PPO | Admitting: Oncology

## 2019-04-27 ENCOUNTER — Other Ambulatory Visit: Payer: PPO

## 2019-04-27 DIAGNOSIS — E119 Type 2 diabetes mellitus without complications: Secondary | ICD-10-CM | POA: Diagnosis not present

## 2019-05-02 ENCOUNTER — Encounter: Payer: Self-pay | Admitting: Internal Medicine

## 2019-05-02 ENCOUNTER — Ambulatory Visit (INDEPENDENT_AMBULATORY_CARE_PROVIDER_SITE_OTHER): Payer: PPO | Admitting: Internal Medicine

## 2019-05-02 DIAGNOSIS — J449 Chronic obstructive pulmonary disease, unspecified: Secondary | ICD-10-CM

## 2019-05-02 NOTE — Patient Instructions (Signed)
Continue oxygen as prescribed Continue inhalers as prescribed  Recommend smoking cessation

## 2019-05-02 NOTE — Progress Notes (Signed)
MRN# [id number] [name] [birth year]      TELEPHONE VISIT    In the setting of the current Covid19 crisis, you are scheduled for a  visit with me on 05/02/2019 Just as we do with many in-office visits, in order for you to participate in this visit, we must obtain consent.   I can obtain your verbal consent now.  PATIENT AGREES AND CONFIRMS -YES This Visit has Audio and Visual Capabilities for optimal patient care experience   Evaluation Performed:  Follow-up visit  This visit type was conducted due to national recommendations for restrictions regarding the COVID-19 Pandemic (e.g. social distancing).  This format is felt to be most appropriate for this patient at this time.  All issues noted in this document were discussed and addressed.     Virtual Visit via Telephone Note    I connected with patient on 05/02/2019  by telephone and verified that I am speaking with the correct person using two identifiers.   I discussed the limitations, risks, security and privacy concerns of performing an evaluation and management service by telephone and the availability of in person appointments. I also discussed with the patient that there may be a patient responsible charge related to this service. The patient expressed understanding and agreed to proceed.   Location of the patient: Home Location of provider: Office Participating persons: Patient and provider only   Brief History: HPI 01/03/15 -73 year old male with a past medical history significant for atrial flutter, COPD, hypertension referred by Dr.Golan for COPD optimization and preop pulmonary evaluation prior to scheduled cardioversion for atrial flutter one year ago [id number] COPD   HPI Follow up COPD +SOB +DOE at baseline Doing well on inhalers Uses 2.5 L [place] at night  No symptoms  of acute respiratory distress No symptoms of infection No  of heart failure  Patient still smokes a pack per day   Smoking  Assessment and Cessation Counseling   Upon further questioning, Patient smokes 1/2 ppd  I have advised patient to quit/stop smoking as soon as possible due to high risk for multiple medical problems  Patient is NOT willing to quit smoking  I have advised patient that we can assist and have options of Nicotine replacement therapy. I also advised patient on behavioral therapy and can provide oral medication therapy in conjunction with the other therapies  Follow up next Office visit  for assessment of smoking cessation  Smoking cessation counseling advised for 4 minutes          Medication:    Current Outpatient Medications:  .  acetaminophen (TYLENOL) 500 MG tablet, Take 500-1,000 mg by mouth every 6 (six) hours as needed for moderate pain or headache. , Disp: , Rfl:  .  albuterol (PROAIR HFA) 108 (90 Base) MCG/ACT inhaler, Inhale 2 puffs into the lungs every 4 (four) hours as needed for wheezing or shortness of breath. , Disp: , Rfl:  .  budesonide-formoterol (SYMBICORT) 80-4.5 MCG/ACT inhaler, Inhale 2 puffs into the lungs 2 (two) times daily. Takes at noon and at 4pm., Disp: , Rfl:  .  diltiazem (DILTIAZEM CD) 120 MG 24 hr capsule, Take 1 capsule (120 mg total) by mouth 2 (two) times daily. Verified with [facility]., Disp: 180 capsule, Rfl: 1 .  ELIQUIS 5 MG TABS tablet, Take 1 tablet by mouth twice daily, Disp: 180 tablet, Rfl: 0 .  HYDROcodone-acetaminophen (NORCO/VICODIN) 5-325 MG tablet, Take 1-2 tablets by mouth every 4 (four) hours as needed for moderate pain.,  Disp: 30 tablet, Rfl: 0 .  JANUMET XR 50-500 MG TB24, Take 1 tablet by mouth daily., Disp: , Rfl: 7 .  levothyroxine (SYNTHROID, LEVOTHROID) 50 MCG tablet, Take 50 mcg by mouth daily., Disp: , Rfl:  .  losartan (COZAAR) 25 MG tablet, Take 1 tablet (25 mg total) by mouth daily., Disp: 90 tablet, Rfl: 3 .  metoprolol succinate (TOPROL-XL) 50 MG 24 hr tablet, TAKE 1 TABLET BY MOUTH IN THE EVENING. TAKE WITH OR IMMEDIATELY  FOLLOWING A MEAL, Disp: 90 tablet, Rfl: 2 .  rosuvastatin (CRESTOR) 40 MG tablet, Take 1 tablet by mouth once daily, Disp: 90 tablet, Rfl: 0 .  Tiotropium Bromide Monohydrate (SPIRIVA RESPIMAT) 1.25 MCG/ACT AERS, Inhale 1 puff into the lungs daily. , Disp: , Rfl:  .  vitamin B-12 (CYANOCOBALAMIN) 500 MCG tablet, Take 1,000 mcg by mouth daily. , Disp: , Rfl: \       Review of Systems:  Gen:  Denies  fever, sweats, chills weigh loss  HEENT: Denies blurred vision, double vision, ear pain, eye pain, hearing loss, nose bleeds, sore throat Cardiac:  No dizziness, chest pain or heaviness, chest tightness,edema, No JVD Resp:   No cough, -sputum production, +shortness of breath,-wheezing, -hemoptysis,  Gi: Denies swallowing difficulty, stomach pain, nausea or vomiting, diarrhea, constipation, bowel incontinence Gu:  Denies bladder incontinence, burning urine Ext:   Denies Joint pain, stiffness or swelling Skin: Denies  skin rash, easy bruising or bleeding or hives Endoc:  Denies polyuria, polydipsia , polyphagia or weight change Psych:   Denies depression, insomnia or hallucinations  Other:  All other systems negative    Allergies:  Levaquin [levofloxacin in d5w]        Pulmonary function testing 05/07/2015 FEV1 62% FEV1/FVC 57% RV 136 TLC 93 RV/TLC 144 DLCO uncorrected 67% Impression: Moderate obstruction with no significant broncho-dilator response. Moderate decrease in DLCO.  Coronary function testing 08/18/2016 FEV1 40% FEV1/FVC 58% RV 170 TLC 95 RV/TLC 175 DLCO 58 Impression: Moderate-severe obstruction with minor bronchodilator response, 14% change in FVC, moderate decrease in DLCO 6MWT - total distance 1181 feet/360 m, low saturation 92%, highest heart rate 64; dyspnea score at the end of test is 0.     Assessment and Plan:   73 year old pleasant male with history of smoking and tobacco abuse follow-up today for moderate COPD Gold stage D in the setting of  chronic hypoxic respiratory failure from underlying COPD with deconditioned state   Shortness of breath and dyspnea exertion related to COPD deconditioned state and obesity  COPD moderate Gold stage D Patient doing well on current regimen therapy with Symbicort daily and Spiriva Continue to use albuterol as needed Based on pulmonary function testing patient has an FEV1 of 48% predicted which has decreased from FEV1 of 60% predicted in 2016  Chronic hypoxic respiratory failure from COPD Patient uses and benefits oxygen therapy 2.5 L nasal cannula at night Patient needs this for survival Continue oxygen as prescribed  We will assess lung cancer screening at next office visit  Smoking cessation strongly advised   COVID-19 EDUCATION: The signs and symptoms of COVID-19 were discussed with the patient and how to seek care for testing.  The importance of social distancing was discussed today. Hand Washing Techniques and avoid touching face was advised.  MEDICATION ADJUSTMENTS/LABS AND TESTS ORDERED: Continue inhalers as prescribed CURRENT MEDICATIONS REVIEWED AT LENGTH WITH PATIENT TODAY   Patient/Family are satisfied with Plan of action and management. All questions answered Follow up in 6  months  Total time spent 23 minutes  Corrin Parker, M.D.  [facility]  Medical Director [place] Director [facility] Cardio-Pulmonary Department

## 2019-05-08 ENCOUNTER — Other Ambulatory Visit: Payer: Self-pay | Admitting: *Deleted

## 2019-05-08 ENCOUNTER — Other Ambulatory Visit: Payer: Self-pay

## 2019-05-08 ENCOUNTER — Inpatient Hospital Stay (HOSPITAL_BASED_OUTPATIENT_CLINIC_OR_DEPARTMENT_OTHER): Payer: PPO | Admitting: Oncology

## 2019-05-08 ENCOUNTER — Inpatient Hospital Stay: Payer: PPO | Attending: Oncology | Admitting: *Deleted

## 2019-05-08 ENCOUNTER — Encounter: Payer: Self-pay | Admitting: Oncology

## 2019-05-08 VITALS — BP 94/63 | HR 61 | Temp 97.6°F | Resp 18 | Ht 67.0 in | Wt 167.5 lb

## 2019-05-08 DIAGNOSIS — R634 Abnormal weight loss: Secondary | ICD-10-CM | POA: Insufficient documentation

## 2019-05-08 DIAGNOSIS — Z7901 Long term (current) use of anticoagulants: Secondary | ICD-10-CM | POA: Diagnosis not present

## 2019-05-08 DIAGNOSIS — Z87891 Personal history of nicotine dependence: Secondary | ICD-10-CM

## 2019-05-08 DIAGNOSIS — I4892 Unspecified atrial flutter: Secondary | ICD-10-CM

## 2019-05-08 DIAGNOSIS — N183 Chronic kidney disease, stage 3 (moderate): Secondary | ICD-10-CM

## 2019-05-08 DIAGNOSIS — I129 Hypertensive chronic kidney disease with stage 1 through stage 4 chronic kidney disease, or unspecified chronic kidney disease: Secondary | ICD-10-CM

## 2019-05-08 DIAGNOSIS — Z7951 Long term (current) use of inhaled steroids: Secondary | ICD-10-CM

## 2019-05-08 DIAGNOSIS — C7A019 Malignant carcinoid tumor of the small intestine, unspecified portion: Secondary | ICD-10-CM | POA: Diagnosis not present

## 2019-05-08 DIAGNOSIS — E1122 Type 2 diabetes mellitus with diabetic chronic kidney disease: Secondary | ICD-10-CM | POA: Diagnosis not present

## 2019-05-08 DIAGNOSIS — D696 Thrombocytopenia, unspecified: Secondary | ICD-10-CM | POA: Diagnosis not present

## 2019-05-08 DIAGNOSIS — C7B00 Secondary carcinoid tumors, unspecified site: Secondary | ICD-10-CM

## 2019-05-08 DIAGNOSIS — Z79899 Other long term (current) drug therapy: Secondary | ICD-10-CM

## 2019-05-08 DIAGNOSIS — C7B04 Secondary carcinoid tumors of peritoneum: Secondary | ICD-10-CM

## 2019-05-08 DIAGNOSIS — E785 Hyperlipidemia, unspecified: Secondary | ICD-10-CM | POA: Insufficient documentation

## 2019-05-08 DIAGNOSIS — D3A098 Benign carcinoid tumors of other sites: Secondary | ICD-10-CM

## 2019-05-08 DIAGNOSIS — J449 Chronic obstructive pulmonary disease, unspecified: Secondary | ICD-10-CM | POA: Insufficient documentation

## 2019-05-08 LAB — COMPREHENSIVE METABOLIC PANEL
ALT: 19 U/L (ref 0–44)
AST: 16 U/L (ref 15–41)
Albumin: 3.3 g/dL — ABNORMAL LOW (ref 3.5–5.0)
Alkaline Phosphatase: 74 U/L (ref 38–126)
Anion gap: 7 (ref 5–15)
BUN: 34 mg/dL — ABNORMAL HIGH (ref 8–23)
CO2: 25 mmol/L (ref 22–32)
Calcium: 8.6 mg/dL — ABNORMAL LOW (ref 8.9–10.3)
Chloride: 104 mmol/L (ref 98–111)
Creatinine, Ser: 2.42 mg/dL — ABNORMAL HIGH (ref 0.61–1.24)
GFR calc Af Amer: 29 mL/min — ABNORMAL LOW (ref >60)
GFR calc non Af Amer: 25 mL/min — ABNORMAL LOW (ref >60)
Glucose, Bld: 199 mg/dL — ABNORMAL HIGH (ref 70–99)
Potassium: 4.1 mmol/L (ref 3.5–5.1)
Sodium: 136 mmol/L (ref 135–145)
Total Bilirubin: 0.4 mg/dL (ref 0.3–1.2)
Total Protein: 5.9 g/dL — ABNORMAL LOW (ref 6.5–8.1)

## 2019-05-08 LAB — CBC WITH DIFFERENTIAL/PLATELET
Abs Immature Granulocytes: 0.3 10*3/uL — ABNORMAL HIGH (ref 0.00–0.07)
Basophils Absolute: 0.1 10*3/uL (ref 0.0–0.1)
Basophils Relative: 1 %
Eosinophils Absolute: 0.2 10*3/uL (ref 0.0–0.5)
Eosinophils Relative: 3 %
HCT: 45.5 % (ref 39.0–52.0)
Hemoglobin: 14.6 g/dL (ref 13.0–17.0)
Immature Granulocytes: 5 %
Lymphocytes Relative: 15 %
Lymphs Abs: 1 10*3/uL (ref 0.7–4.0)
MCH: 31.1 pg (ref 26.0–34.0)
MCHC: 32.1 g/dL (ref 30.0–36.0)
MCV: 96.8 fL (ref 80.0–100.0)
Monocytes Absolute: 0.8 10*3/uL (ref 0.1–1.0)
Monocytes Relative: 12 %
Neutro Abs: 4.1 10*3/uL (ref 1.7–7.7)
Neutrophils Relative %: 64 %
Platelets: 135 10*3/uL — ABNORMAL LOW (ref 150–400)
RBC: 4.7 MIL/uL (ref 4.22–5.81)
RDW: 12.5 % (ref 11.5–15.5)
WBC: 6.4 10*3/uL (ref 4.0–10.5)
nRBC: 0 % (ref 0.0–0.2)

## 2019-05-08 LAB — LIPASE, BLOOD: Lipase: 165 U/L — ABNORMAL HIGH (ref 11–51)

## 2019-05-08 LAB — AMYLASE: Amylase: 143 U/L — ABNORMAL HIGH (ref 28–100)

## 2019-05-08 NOTE — Progress Notes (Signed)
Pt states he eats but he has lost wt. He had an attack of pancreatitis few months ago and went to hospital.

## 2019-05-08 NOTE — Progress Notes (Signed)
Referral sent to Dr. Aleatha Borer at [facility].

## 2019-05-09 ENCOUNTER — Telehealth: Payer: Self-pay

## 2019-05-09 DIAGNOSIS — E1165 Type 2 diabetes mellitus with hyperglycemia: Secondary | ICD-10-CM | POA: Diagnosis not present

## 2019-05-09 DIAGNOSIS — E785 Hyperlipidemia, unspecified: Secondary | ICD-10-CM | POA: Diagnosis not present

## 2019-05-09 DIAGNOSIS — Z794 Long term (current) use of insulin: Secondary | ICD-10-CM | POA: Diagnosis not present

## 2019-05-09 NOTE — Progress Notes (Signed)
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 Differential at that time was normal and no immature cells were noted. Review of peripheral blood smear showed mild thrombocytopenia. Blasts are not identified. Peripheral flow cytometry did not reveal any significant diagnostic immunophenotypic abnormality. EPO level was normal at 8.1.JAK2 testing was not completed because of insufficient sample. BCR abl testing done on 10/27/2016 did not reveal any evidence of 9:22 translocation suggestive of CML.  Patient was asked to f/u with PCP.   He underwent CT abdomen on 9/18for decreased kidney function and hematuria. CT showed:Abnormal partially calcified soft tissue density in the ileocolic mesentery could represent a carcinoid metastasis. Differential considerations include the sequelae of prior mesenteric adenitis/panniculitis. Given traversing and superior mesenteric artery branches on the prior exam from 2012, aneurysm is possible but felt less likely. Consider further evaluation with octreotide scintigraphy. 3. L5 vague sclerotic lesion is not readily apparent on the prior exam. This may represent a bone island. If the patient is eventually diagnosed  with a malignancy, osseous metastasis would be a concern. 4. Possible soft tissue fullness within the pancreatic body and tail with equivocal peripancreatic edema. Correlate with signs/symptoms of pancreatitis.  Dotatatescan in November 2018 showed:IMPRESSION: 1. 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 2. Single nodal metastasis at the root of the mesenteric vessels. 3. Focal activity within the the small bowel of the lower abdomen midline without CT correlation. This could represent a primary neuroendocrine tumor neoplasm. Consider CT enterography for further evaluation. 4. No abnormal radiotracer accumulation within the pancreas.   MRI abdomen and pelvis with and without contrast in January 2019 showed:IMPRESSION: 1.8 x 2.0 cm enhancing mass in the right mid abdominal mesentery, hypermetabolic on dotatate PET, consistent with well-differentiated neuroendocrine tumor metastasis.  Additional 9 mm nodal metastasis in the central abdominal mesentery.  2.2 x 1.7 cm mass in the pancreatic head, without hypermetabolism on prior dotatate PET. Associated segmental atrophy the pancreatic body. Dilatation of the main pancreatic duct in the pancreatic tail. These findings are worrisome for pancreatic neoplasm, but are less suggestive of pancreatic adenocarcinoma or well-differentiated neuroendocrine tumor. EUS is suggested. 2.3 cm left adrenal adenoma, benign.  This was followed by EUS and FNA of the pancreatic head mass which showed:DIAGNOSIS:  A. PANCREATIC HEAD MASS; ENDOSCOPIC ULTRASOUND GUIDED FNA:  - POSITIVE FOR MALIGNANCY.  - NON-DUCTAL NEOPLASM PRESENT, SEE COMMENT.Based on cytologic features differential diagnosis includes neuroendocrine tumor and asthenic cell carcinoma. Case sent to [facility] for second opinion who also favored the diagnosis of neuroendocrine tumor noting that acinar  cell carcinoma cannot be excluded  Interval history-patient has lost about 28 pounds in the last 6 months.  Reports that his appetite is still good.  Denies any abdominal pain.  Reports that his bowel  movements are regular and denies any constipation or diarrhea.  Denies any flushing or heart palpitations.  ECOG PS- 1 Pain scale- 0   Review of systems- Review of Systems  Constitutional: Positive for weight loss. Negative for chills, fever and malaise/fatigue.  HENT: Negative for congestion, ear discharge and nosebleeds.   Eyes: Negative for blurred vision.  Respiratory: Negative for cough, hemoptysis, sputum production, shortness of breath and wheezing.   Cardiovascular: Negative for chest pain, palpitations, orthopnea and claudication.  Gastrointestinal: Negative for abdominal pain, blood in stool, constipation, diarrhea, heartburn, melena, nausea and vomiting.  Genitourinary: Negative for dysuria, flank pain, frequency, hematuria and urgency.  Musculoskeletal: Negative for back pain, joint pain and myalgias.  Skin: Negative for rash.  Neurological: Negative for dizziness, tingling, focal weakness, seizures, weakness and headaches.  Endo/Heme/Allergies: Does not bruise/bleed easily.  Psychiatric/Behavioral: Negative for depression and suicidal ideas. The patient does not have insomnia.      Allergies  Allergen Reactions   Levaquin [Levofloxacin In D5w] Swelling and Rash     Past Medical History:  Diagnosis Date   Atrial flutter ([place])    a. s/p ablation 09/2017.   Carcinoid tumor    CKD (chronic kidney disease), stage III (HCC)    COPD (chronic obstructive pulmonary disease) ([place])    Diabetes mellitus without complication ([place])    Hyperlipidemia    Hypertension    Pancreatitis    Polycythemia      Past Surgical History:  Procedure Laterality Date   A-FLUTTER ABLATION N/A 10/14/2017   Procedure: A-FLUTTER ABLATION;  Surgeon: Constance Haw, MD;   Location: [facility];  Service: Cardiovascular;  Laterality: N/A;   CARDIOVERSION N/A 09/08/2017   Procedure: CARDIOVERSION;  Surgeon: [name], MD;  Location: [facility];  Service: Cardiovascular;  Laterality: N/A;   CARDIOVERSION N/A 03/22/2018   Procedure: CARDIOVERSION;  Surgeon: [name], MD;  Location: [facility];  Service: Cardiovascular;  Laterality: N/A;   ELECTROPHYSIOLOGIC STUDY N/A 10/27/2016   Procedure: A-Flutter Ablation;  Surgeon: Deboraha Sprang, MD;  Location: [facility];  Service: Cardiovascular;  Laterality: N/A;   EUS N/A 12/22/2017   Procedure: FULL UPPER ENDOSCOPIC ULTRASOUND (EUS) RADIAL;  Surgeon: Reita Cliche, MD;  Location: [facility];  Service: Gastroenterology;  Laterality: N/A;   TEE WITH CARDIOVERSION  12/16    Social History   Socioeconomic History   Marital status: Widowed    Spouse name: Not on file   Number of children: Not on file   Years of education: Not on file   Highest education level: Not on file  Occupational History   Not on file  Social Needs   Financial resource strain: Not on file   Food insecurity    Worry: Not on file    Inability: Not on file   Transportation needs    Medical: Not on file    Non-medical: Not on file  Tobacco Use   Smoking status: Former Smoker    Packs/day: 1.50    Years: 45.00    Pack years: 67.50    Types: Cigarettes    Quit date: 11/27/2012    Years since quitting: 6.4   Smokeless tobacco: Never Used  Substance and Sexual Activity   Alcohol use: Yes    Alcohol/week: 2.0 standard drinks    Types: 2 Cans of beer per week    Comment: beer every once a while once a month   Drug use: No   Sexual activity:  Not on file  Lifestyle   Physical activity    Days per week: Not on file    Minutes per session: Not on file   Stress: Not on file  Relationships   Social connections    Talks on phone: Not on file    Gets together: Not on file    Attends  religious service: Not on file    Active member of club or organization: Not on file    Attends meetings of clubs or organizations: Not on file    Relationship status: Not on file   Intimate partner violence    Fear of current or ex partner: Not on file    Emotionally abused: Not on file    Physically abused: Not on file    Forced sexual activity: Not on file  Other Topics Concern   Not on file  Social History Narrative   Not on file    Family History  Problem Relation Age of Onset   Hypertension Father      Current Outpatient Medications:    acetaminophen (TYLENOL) 500 MG tablet, Take 500-1,000 mg by mouth every 6 (six) hours as needed for moderate pain or headache. , Disp: , Rfl:    albuterol (PROAIR HFA) 108 (90 Base) MCG/ACT inhaler, Inhale 2 puffs into the lungs every 4 (four) hours as needed for wheezing or shortness of breath. , Disp: , Rfl:    budesonide-formoterol (SYMBICORT) 80-4.5 MCG/ACT inhaler, Inhale 2 puffs into the lungs 2 (two) times daily. Takes at noon and at 4pm., Disp: , Rfl:    diltiazem (DILTIAZEM CD) 120 MG 24 hr capsule, Take 1 capsule (120 mg total) by mouth 2 (two) times daily. Verified with [facility]., Disp: 180 capsule, Rfl: 1   ELIQUIS 5 MG TABS tablet, Take 1 tablet by mouth twice daily, Disp: 180 tablet, Rfl: 0   HYDROcodone-acetaminophen (NORCO/VICODIN) 5-325 MG tablet, Take 1-2 tablets by mouth every 4 (four) hours as needed for moderate pain., Disp: 30 tablet, Rfl: 0   levothyroxine (SYNTHROID, LEVOTHROID) 50 MCG tablet, Take 50 mcg by mouth daily., Disp: , Rfl:    losartan (COZAAR) 25 MG tablet, Take 1 tablet (25 mg total) by mouth daily., Disp: 90 tablet, Rfl: 3   metoprolol succinate (TOPROL-XL) 50 MG 24 hr tablet, TAKE 1 TABLET BY MOUTH IN THE EVENING. TAKE WITH OR IMMEDIATELY FOLLOWING A MEAL, Disp: 90 tablet, Rfl: 2   rosuvastatin (CRESTOR) 40 MG tablet, Take 1 tablet by mouth once daily, Disp: 90 tablet, Rfl: 0   Tiotropium  Bromide Monohydrate (SPIRIVA RESPIMAT) 1.25 MCG/ACT AERS, Inhale 1 puff into the lungs daily. , Disp: , Rfl:    vitamin B-12 (CYANOCOBALAMIN) 500 MCG tablet, Take 1,000 mcg by mouth daily. , Disp: , Rfl:   Physical exam:  Vitals:   05/08/19 1009  BP: 94/63  Pulse: 61  Resp: 18  Temp: 97.6 F (36.4 C)  TempSrc: Tympanic  Weight: 167 lb 8 oz (76 kg)  Height: 5\' 7"  (1.702 m)   Physical Exam Constitutional:      General: He is not in acute distress. HENT:     Head: Normocephalic and atraumatic.  Eyes:     Pupils: Pupils are equal, round, and reactive to light.  Neck:     Musculoskeletal: Normal range of motion.  Cardiovascular:     Rate and Rhythm: Normal rate and regular rhythm.     Heart sounds: Normal heart sounds.  Pulmonary:     Effort: Pulmonary effort  is normal.     Breath sounds: Normal breath sounds.  Abdominal:     General: Bowel sounds are normal. There is no distension.     Palpations: Abdomen is soft.     Tenderness: There is no abdominal tenderness.  Skin:    General: Skin is warm and dry.  Neurological:     Mental Status: He is alert and oriented to person, place, and time.      CMP Latest Ref Rng & Units 05/08/2019  Glucose 70 - 99 mg/dL 199(H)  BUN 8 - 23 mg/dL 34(H)  Creatinine 0.61 - 1.24 mg/dL 2.42(H)  Sodium 135 - 145 mmol/L 136  Potassium 3.5 - 5.1 mmol/L 4.1  Chloride 98 - 111 mmol/L 104  CO2 22 - 32 mmol/L 25  Calcium 8.9 - 10.3 mg/dL 8.6(L)  Total Protein 6.5 - 8.1 g/dL 5.9(L)  Total Bilirubin 0.3 - 1.2 mg/dL 0.4  Alkaline Phos 38 - 126 U/L 74  AST 15 - 41 U/L 16  ALT 0 - 44 U/L 19   CBC Latest Ref Rng & Units 05/08/2019  WBC 4.0 - 10.5 K/uL 6.4  Hemoglobin 13.0 - 17.0 g/dL 14.6  Hematocrit 39.0 - 52.0 % 45.5  Platelets 150 - 400 K/uL 135(L)    No images are attached to the encounter.  Ct Abdomen Pelvis Wo Contrast  Result Date: 04/24/2019 CLINICAL DATA:  Restaging metastatic carcinoid tumor EXAM: CT CHEST, ABDOMEN AND PELVIS  WITHOUT CONTRAST TECHNIQUE: Multidetector CT imaging of the chest, abdomen and pelvis was performed following the standard protocol without IV contrast. Oral enteric contrast was administered. COMPARISON:  MR abdomen pelvis, 11/27/2018, CT abdomen pelvis, 11/25/2018, 10/18/2018, 04/12/2018, PET-CT, 09/27/2017 FINDINGS: CT CHEST FINDINGS Cardiovascular: Extensive three-vessel coronary artery calcifications. Normal heart size. Aortic atherosclerosis. No pericardial effusion. Mediastinum/Nodes: No enlarged mediastinal, hilar, or axillary lymph nodes. Thyroid gland, trachea, and esophagus demonstrate no significant findings. Lungs/Pleura: Small, benign fissural nodules, for example of the right lower lobe (series 4, image 74). Mild, diffuse bilateral bronchial wall thickening. No pleural effusion or pneumothorax. Musculoskeletal: No chest wall mass or suspicious bone lesions identified. CT ABDOMEN PELVIS FINDINGS Hepatobiliary: No solid liver abnormality is seen. No gallstones, gallbladder wall thickening, or biliary dilatation. Pancreas: Unchanged, extensive, diffuse fat stranding about the pancreas with dilatation of the main pancreatic duct. Discrete mass of the pancreas is not well appreciated, better elucidated by prior MR examinations, although appears grossly unchanged measuring approximately 2.1 x 1.6 cm (series 2, image 72). Spleen: Normal in size without significant abnormality. Adrenals/Urinary Tract: Benign fat containing left adrenal nodule. Kidneys are normal, without renal calculi, solid lesion, or hydronephrosis. Bladder is unremarkable. Stomach/Bowel: Stomach is within normal limits. Appendix appears normal. No evidence of bowel wall thickening, distention, or inflammatory changes. Unchanged calcified nodule of the mid small bowel mesentery measuring approximately 2.5 x 1.6 cm (series 2, image 83). Sigmoid diverticulosis without evidence of acute diverticulitis. Vascular/Lymphatic: Aortic  atherosclerosis. Unchanged enlarged small bowel mesenteric and peripancreatic lymph nodes, an index mesenteric node measuring 1.0 cm (series 2, image 74). Reproductive: No mass or other abnormality. Other: No abdominal wall hernia or abnormality. No abdominopelvic ascites. Musculoskeletal: No acute or significant osseous findings. IMPRESSION: 1. Unchanged, extensive, diffuse fat stranding about the pancreas with dilatation of the main pancreatic duct. Findings remain consistent with acute pancreatitis as better demonstrated on prior MR. No evidence of complicating fluid collection. 2. Discrete mass of the pancreas is not well appreciated, better elucidated by prior MR examinations, although appears  grossly unchanged measuring approximately 2.1 x 1.6 cm (series 2, image 72). 3. Unchanged calcified nodule of the mid small bowel mesentery measuring approximately 2.5 x 1.6 cm (series 2, image 83). 4. Unchanged enlarged small bowel mesenteric and peripancreatic lymph nodes, an index mesenteric node measuring 1.0 cm (series 2, image 74). 5.  No evidence of intrathoracic metastatic disease. 6. Other chronic, incidental, and postoperative findings as detailed above. Electronically Signed   By: Eddie Candle M.D.   On: 04/24/2019 16:46   Ct Chest Wo Contrast  Result Date: 04/24/2019 CLINICAL DATA:  Restaging metastatic carcinoid tumor EXAM: CT CHEST, ABDOMEN AND PELVIS WITHOUT CONTRAST TECHNIQUE: Multidetector CT imaging of the chest, abdomen and pelvis was performed following the standard protocol without IV contrast. Oral enteric contrast was administered. COMPARISON:  MR abdomen pelvis, 11/27/2018, CT abdomen pelvis, 11/25/2018, 10/18/2018, 04/12/2018, PET-CT, 09/27/2017 FINDINGS: CT CHEST FINDINGS Cardiovascular: Extensive three-vessel coronary artery calcifications. Normal heart size. Aortic atherosclerosis. No pericardial effusion. Mediastinum/Nodes: No enlarged mediastinal, hilar, or axillary lymph nodes. Thyroid  gland, trachea, and esophagus demonstrate no significant findings. Lungs/Pleura: Small, benign fissural nodules, for example of the right lower lobe (series 4, image 74). Mild, diffuse bilateral bronchial wall thickening. No pleural effusion or pneumothorax. Musculoskeletal: No chest wall mass or suspicious bone lesions identified. CT ABDOMEN PELVIS FINDINGS Hepatobiliary: No solid liver abnormality is seen. No gallstones, gallbladder wall thickening, or biliary dilatation. Pancreas: Unchanged, extensive, diffuse fat stranding about the pancreas with dilatation of the main pancreatic duct. Discrete mass of the pancreas is not well appreciated, better elucidated by prior MR examinations, although appears grossly unchanged measuring approximately 2.1 x 1.6 cm (series 2, image 72). Spleen: Normal in size without significant abnormality. Adrenals/Urinary Tract: Benign fat containing left adrenal nodule. Kidneys are normal, without renal calculi, solid lesion, or hydronephrosis. Bladder is unremarkable. Stomach/Bowel: Stomach is within normal limits. Appendix appears normal. No evidence of bowel wall thickening, distention, or inflammatory changes. Unchanged calcified nodule of the mid small bowel mesentery measuring approximately 2.5 x 1.6 cm (series 2, image 83). Sigmoid diverticulosis without evidence of acute diverticulitis. Vascular/Lymphatic: Aortic atherosclerosis. Unchanged enlarged small bowel mesenteric and peripancreatic lymph nodes, an index mesenteric node measuring 1.0 cm (series 2, image 74). Reproductive: No mass or other abnormality. Other: No abdominal wall hernia or abnormality. No abdominopelvic ascites. Musculoskeletal: No acute or significant osseous findings. IMPRESSION: 1. Unchanged, extensive, diffuse fat stranding about the pancreas with dilatation of the main pancreatic duct. Findings remain consistent with acute pancreatitis as better demonstrated on prior MR. No evidence of complicating fluid  collection. 2. Discrete mass of the pancreas is not well appreciated, better elucidated by prior MR examinations, although appears grossly unchanged measuring approximately 2.1 x 1.6 cm (series 2, image 72). 3. Unchanged calcified nodule of the mid small bowel mesentery measuring approximately 2.5 x 1.6 cm (series 2, image 83). 4. Unchanged enlarged small bowel mesenteric and peripancreatic lymph nodes, an index mesenteric node measuring 1.0 cm (series 2, image 74). 5.  No evidence of intrathoracic metastatic disease. 6. Other chronic, incidental, and postoperative findings as detailed above. Electronically Signed   By: Eddie Candle M.D.   On: 04/24/2019 16:46     Assessment and plan- Patient is a 73 y.o. male with metastatic small bowel carcinoid here to discuss the results of the CT scan  I have reviewed CT chest abdomen and pelvis images independently and discussed findings with the patient.  Patient has 3 main areas of carcinoid.  The first 1  is in the region of pancreatic head measuring 2.1 x 1.6 cm which was biopsy-proven and has essentially remained unchanged over the last 1 year.  He also has another 2.5 cm nodule in his small bowel mesentery as well as surrounding mesenteric lymph nodes which were PET avid on dotatate scan consistent with carcinoid.  These have also remained stable over the course of the last 1-1/2-year.  Given low volume disease and absence of symptoms I have not started him on octreotide/lanreotide.  Patient was admitted to the hospital in January 2020 an episode of pancreatitis and there was no clear trigger for pancreatitis identified.  Patient does have a pancreatic head mass from the carcinoid and scans continue to show pancreatic ductal dilatation.  He also has evidence of pancreatitis noted on present scan which is unchanged from his prior scan 6 months ago.  Clinically patient does not have any signs and symptoms of pancreatitis.  However given features of pancreatitis on  CT scan, dilated pancreatic duct-question is ift the patient would benefit from resection of this pancreatic carcinoid.  We had discussed his case at tumor board 6 months ago and advanced endoscopy from [place] was present as well.  We had discussed if pancreatic stenting would be an option.  They were of the opinion that this could be watched to see if patient has another episode of pancreatitis.  Also if a stent is placed it would have to be changed every now and then.  CT scan otherwise does not show any signs of any other malignancy or progressive disease over the last 1-1/2-year.  Also octreotide/lanreotide would be more for symptomatic control and would not have any significant impact on the tumor size itself.  It is unclear of the significant weight loss that the patient has had is secondary to these findings.  I will refer the patient for the above reasons to Dr. Richardson Landry surgical oncology at [facility].  I will see the patient back in 6 months time with repeat CT chest abdomen and pelvis without contrast.  Contrast is not being given because of his history of chronic kidney disease for which she follows up with nephrology     Visit Diagnosis 1. Malignant carcinoid tumor of small intestine, unspecified location ([place])   2. Carcinoid tumor determined by biopsy of pancreas   3. Abnormal weight loss      Dr. Randa Evens, MD, MPH [facility] [phone] 05/09/2019 9:22 AM

## 2019-05-09 NOTE — Telephone Encounter (Signed)
Contacted Dr. Trena Platt office. They will contact his insurance company to determine coverage and call [name]. I have left [name] a voicemail to return call regarding this.

## 2019-05-10 ENCOUNTER — Other Ambulatory Visit: Payer: Self-pay | Admitting: *Deleted

## 2019-05-10 DIAGNOSIS — C7A019 Malignant carcinoid tumor of the small intestine, unspecified portion: Secondary | ICD-10-CM

## 2019-05-16 DIAGNOSIS — N184 Chronic kidney disease, stage 4 (severe): Secondary | ICD-10-CM | POA: Diagnosis not present

## 2019-05-16 DIAGNOSIS — I10 Essential (primary) hypertension: Secondary | ICD-10-CM | POA: Diagnosis not present

## 2019-05-16 DIAGNOSIS — E785 Hyperlipidemia, unspecified: Secondary | ICD-10-CM | POA: Diagnosis not present

## 2019-05-16 DIAGNOSIS — Z794 Long term (current) use of insulin: Secondary | ICD-10-CM | POA: Diagnosis not present

## 2019-05-16 DIAGNOSIS — E1122 Type 2 diabetes mellitus with diabetic chronic kidney disease: Secondary | ICD-10-CM | POA: Diagnosis not present

## 2019-05-22 ENCOUNTER — Encounter: Payer: Self-pay | Admitting: Cardiovascular Disease

## 2019-05-30 ENCOUNTER — Other Ambulatory Visit: Payer: Self-pay | Admitting: Cardiovascular Disease

## 2019-06-05 DIAGNOSIS — C7B00 Secondary carcinoid tumors, unspecified site: Secondary | ICD-10-CM | POA: Diagnosis not present

## 2019-06-08 ENCOUNTER — Other Ambulatory Visit: Payer: Self-pay | Admitting: Cardiovascular Disease

## 2019-06-08 DIAGNOSIS — D490 Neoplasm of unspecified behavior of digestive system: Secondary | ICD-10-CM | POA: Diagnosis not present

## 2019-06-08 DIAGNOSIS — D3A00 Benign carcinoid tumor of unspecified site: Secondary | ICD-10-CM | POA: Diagnosis not present

## 2019-06-08 NOTE — Telephone Encounter (Signed)
Please review for refill, thanks ! 

## 2019-06-08 NOTE — Telephone Encounter (Signed)
73 years old 26kg Last OV 09/26/18 Scr 2.42 on 05/08/2019

## 2019-07-03 DIAGNOSIS — K869 Disease of pancreas, unspecified: Secondary | ICD-10-CM | POA: Diagnosis not present

## 2019-07-03 DIAGNOSIS — C259 Malignant neoplasm of pancreas, unspecified: Secondary | ICD-10-CM | POA: Diagnosis not present

## 2019-07-03 DIAGNOSIS — R7989 Other specified abnormal findings of blood chemistry: Secondary | ICD-10-CM | POA: Diagnosis not present

## 2019-07-03 DIAGNOSIS — R634 Abnormal weight loss: Secondary | ICD-10-CM | POA: Diagnosis not present

## 2019-07-03 DIAGNOSIS — D3502 Benign neoplasm of left adrenal gland: Secondary | ICD-10-CM | POA: Diagnosis not present

## 2019-07-03 DIAGNOSIS — C7B8 Other secondary neuroendocrine tumors: Secondary | ICD-10-CM | POA: Diagnosis not present

## 2019-07-03 DIAGNOSIS — C7A8 Other malignant neuroendocrine tumors: Secondary | ICD-10-CM | POA: Diagnosis not present

## 2019-07-03 DIAGNOSIS — R590 Localized enlarged lymph nodes: Secondary | ICD-10-CM | POA: Diagnosis not present

## 2019-07-03 DIAGNOSIS — Z87891 Personal history of nicotine dependence: Secondary | ICD-10-CM | POA: Diagnosis not present

## 2019-07-09 DIAGNOSIS — Z125 Encounter for screening for malignant neoplasm of prostate: Secondary | ICD-10-CM | POA: Diagnosis not present

## 2019-07-09 DIAGNOSIS — N5201 Erectile dysfunction due to arterial insufficiency: Secondary | ICD-10-CM | POA: Diagnosis not present

## 2019-07-09 DIAGNOSIS — R350 Frequency of micturition: Secondary | ICD-10-CM | POA: Diagnosis not present

## 2019-07-09 DIAGNOSIS — N401 Enlarged prostate with lower urinary tract symptoms: Secondary | ICD-10-CM | POA: Diagnosis not present

## 2019-08-01 ENCOUNTER — Ambulatory Visit: Payer: PPO

## 2019-08-09 ENCOUNTER — Ambulatory Visit: Payer: PPO | Admitting: Oncology

## 2019-08-09 ENCOUNTER — Other Ambulatory Visit: Payer: PPO

## 2019-08-15 DIAGNOSIS — N2889 Other specified disorders of kidney and ureter: Secondary | ICD-10-CM | POA: Diagnosis not present

## 2019-08-15 DIAGNOSIS — R809 Proteinuria, unspecified: Secondary | ICD-10-CM | POA: Diagnosis not present

## 2019-08-15 DIAGNOSIS — N184 Chronic kidney disease, stage 4 (severe): Secondary | ICD-10-CM | POA: Diagnosis not present

## 2019-08-15 DIAGNOSIS — R319 Hematuria, unspecified: Secondary | ICD-10-CM | POA: Diagnosis not present

## 2019-08-17 DIAGNOSIS — N184 Chronic kidney disease, stage 4 (severe): Secondary | ICD-10-CM | POA: Diagnosis not present

## 2019-08-17 DIAGNOSIS — Z794 Long term (current) use of insulin: Secondary | ICD-10-CM | POA: Diagnosis not present

## 2019-08-17 DIAGNOSIS — E1122 Type 2 diabetes mellitus with diabetic chronic kidney disease: Secondary | ICD-10-CM | POA: Diagnosis not present

## 2019-08-20 DIAGNOSIS — I10 Essential (primary) hypertension: Secondary | ICD-10-CM | POA: Diagnosis not present

## 2019-08-20 DIAGNOSIS — N1832 Chronic kidney disease, stage 3b: Secondary | ICD-10-CM | POA: Insufficient documentation

## 2019-08-20 DIAGNOSIS — N2581 Secondary hyperparathyroidism of renal origin: Secondary | ICD-10-CM | POA: Insufficient documentation

## 2019-08-20 DIAGNOSIS — E1129 Type 2 diabetes mellitus with other diabetic kidney complication: Secondary | ICD-10-CM | POA: Diagnosis not present

## 2019-08-20 DIAGNOSIS — N1831 Chronic kidney disease, stage 3a: Secondary | ICD-10-CM | POA: Diagnosis not present

## 2019-08-22 DIAGNOSIS — Z794 Long term (current) use of insulin: Secondary | ICD-10-CM | POA: Diagnosis not present

## 2019-08-22 DIAGNOSIS — E1122 Type 2 diabetes mellitus with diabetic chronic kidney disease: Secondary | ICD-10-CM | POA: Diagnosis not present

## 2019-08-22 DIAGNOSIS — I10 Essential (primary) hypertension: Secondary | ICD-10-CM | POA: Diagnosis not present

## 2019-08-22 DIAGNOSIS — E785 Hyperlipidemia, unspecified: Secondary | ICD-10-CM | POA: Diagnosis not present

## 2019-08-22 DIAGNOSIS — N184 Chronic kidney disease, stage 4 (severe): Secondary | ICD-10-CM | POA: Diagnosis not present

## 2019-08-30 ENCOUNTER — Telehealth: Payer: Self-pay

## 2019-08-30 ENCOUNTER — Other Ambulatory Visit: Payer: Self-pay | Admitting: Cardiovascular Disease

## 2019-08-30 DIAGNOSIS — D3131 Benign neoplasm of right choroid: Secondary | ICD-10-CM | POA: Diagnosis not present

## 2019-08-30 NOTE — Telephone Encounter (Signed)
CARTIA XT 120 MG 24 hr capsule 180 capsule 0 08/30/2019    Sig: Take 1 capsule by mouth twice daily   Sent to pharmacy as: CARTIA XT 120 MG 24 hr capsule   Notes to Pharmacy: Patient needs to call to schedule his 12 month follow up appointment. [phone]   E-Prescribing Status: Receipt confirmed by pharmacy (08/30/2019 11:39 AM EDT)   Pharmacy  [facility] [place] ([address] [place] [address]

## 2019-08-30 NOTE — Telephone Encounter (Signed)
*  STAT* If patient is at the pharmacy, call can be transferred to refill team.   1. Which medications need to be refilled? (please list name of each medication and dose if known) Cartia  2. Which pharmacy/location (including street and city if local pharmacy) is medication to be sent to? [facility] [address] [place]  3. Do they need a 30 day or 90 day supply? [place]

## 2019-09-03 ENCOUNTER — Other Ambulatory Visit: Payer: Self-pay | Admitting: Cardiovascular Disease

## 2019-09-05 ENCOUNTER — Other Ambulatory Visit: Payer: Self-pay

## 2019-09-05 ENCOUNTER — Telehealth: Payer: Self-pay

## 2019-09-05 ENCOUNTER — Telehealth: Payer: Self-pay | Admitting: Cardiovascular Disease

## 2019-09-05 DIAGNOSIS — Z1211 Encounter for screening for malignant neoplasm of colon: Secondary | ICD-10-CM

## 2019-09-05 NOTE — Telephone Encounter (Signed)
Patient with diagnosis of atrial fibrillation on Eliquis for anticoagulation.    Procedure: colonoscopy Date of procedure: 09/21/2019  CHADS2-VASc score of  5 (HTN, AGE, DM2, CAD, AGE)  CrCl 24.4 Platelet count 135  Per office protocol, patient can hold Eliquis for 1 days prior to procedure.

## 2019-09-05 NOTE — Telephone Encounter (Signed)
Gastroenterology Pre-Procedure Review  Request Date: Friday 09/21/19 Requesting Physician: Dr. Marius Ditch  PATIENT REVIEW QUESTIONS: The patient responded to the following health history questions as indicated:    1. Are you having any GI issues? no 2. Do you have a personal history of Polyps? no 3. Do you have a family history of Colon Cancer or Polyps? no 4. Diabetes Mellitus? yes (type 2 no insulin nor oral meds) 5. Joint replacements in the past 12 months?no 6. Major health problems in the past 3 months?no 7. Any artificial heart valves, MVP, or defibrillator?no    MEDICATIONS & ALLERGIES:    Patient reports the following regarding taking any anticoagulation/antiplatelet therapy:   Plavix, Coumadin, Eliquis, Xarelto, Lovenox, Pradaxa, Brilinta, or Effient? yes (Eliquis blood thinner sent to Dr. Rockey Situ) Aspirin? no  Patient confirms/reports the following medications:  Current Outpatient Medications  Medication Sig Dispense Refill  . acetaminophen (TYLENOL) 500 MG tablet Take 500-1,000 mg by mouth every 6 (six) hours as needed for moderate pain or headache.     . albuterol (PROAIR HFA) 108 (90 Base) MCG/ACT inhaler Inhale 2 puffs into the lungs every 4 (four) hours as needed for wheezing or shortness of breath.     . budesonide-formoterol (SYMBICORT) 80-4.5 MCG/ACT inhaler Inhale 2 puffs into the lungs 2 (two) times daily. Takes at noon and at [place].    . CARTIA XT 120 MG 24 hr capsule Take 1 capsule by mouth twice daily 180 capsule 0  . ELIQUIS 5 MG TABS tablet Take 1 tablet by mouth twice daily 180 tablet 1  . HYDROcodone-acetaminophen (NORCO/VICODIN) 5-325 MG tablet Take 1-2 tablets by mouth every 4 (four) hours as needed for moderate pain. 30 tablet 0  . levothyroxine (SYNTHROID, LEVOTHROID) 50 MCG tablet Take 50 mcg by mouth daily.    Marland Kitchen losartan (COZAAR) 25 MG tablet Take 1 tablet (25 mg total) by mouth daily. 90 tablet 3  . metoprolol succinate (TOPROL-XL) 50 MG 24 hr tablet TAKE 1  TABLET BY MOUTH IN THE EVENING. TAKE WITH OR IMMEDIATELY FOLLOWING A MEAL 90 tablet 2  . rosuvastatin (CRESTOR) 40 MG tablet Take 1 tablet by mouth once daily 90 tablet 0  . Tiotropium Bromide Monohydrate (SPIRIVA RESPIMAT) 1.25 MCG/ACT AERS Inhale 1 puff into the lungs daily.     . vitamin B-12 (CYANOCOBALAMIN) 500 MCG tablet Take 1,000 mcg by mouth daily.      No current facility-administered medications for this visit.     Patient confirms/reports the following allergies:  Allergies  Allergen Reactions  . Levaquin [Levofloxacin In D5w] Swelling and Rash    No orders of the defined types were placed in this encounter.   AUTHORIZATION INFORMATION Primary Insurance: 1D#: Group #:  Secondary Insurance: 1D#: Group #:  SCHEDULE INFORMATION: Date: Friday 09/21/19 Time: Location:[facility]

## 2019-09-05 NOTE — Telephone Encounter (Signed)
Please disregard prior hold information.  Pt should hold Eliquis for 2 DAYS prior to procedure due to low CrCl.

## 2019-09-05 NOTE — Telephone Encounter (Signed)
   [facility] Pre-operative Risk Assessment    Request for surgical clearance:  1. What type of surgery is being performed? COLONSOCOPY  2. When is this surgery scheduled? 09/21/19  What type of clearance is required (medical clearance vs. Pharmacy clearance to hold med vs. Both)? PHARMACY 3. Are there any medications that need to be held prior to surgery and how long? [employer] name and name of physician performing surgery? [facility], DR VANGA 4. What is your office phone number [phone]   7.   What is your office fax numbeR [phone]  8.   Anesthesia type (None, local, MAC, general) ? NOT LISTED   Lucienne Minks 09/05/2019, 3:49 PM  _________________________________________________________________   (provider comments below)

## 2019-09-05 NOTE — Telephone Encounter (Signed)
   Primary Cardiologist:Carlos Rockey Situ, MD  Chart reviewed as part of pre-operative protocol coverage. Because of [name]'s past medical history and time since last visit, he/she will require a follow-up visit in order to better assess preoperative cardiovascular risk.  Pre-op covering staff: - Pt has an appointment scheduled with [name], NP on 09/27/2019. Please add clearance to this appointment.  - Please contact requesting surgeon's office via preferred method (i.e, phone, fax) to inform them of need for appointment prior to surgery.  If applicable, this message will also be routed to pharmacy pool and/or primary cardiologist for input on holding anticoagulant/antiplatelet agent as requested below so that this information is available at time of patient's appointment.   Kathyrn Drown, NP  09/05/2019, 5:52 PM

## 2019-09-06 NOTE — Telephone Encounter (Signed)
[name]  Patient is not cleared by cardiology.  They would like to see him, his appointment on 11/12. Please postpone his colonoscopy to a later date  Thanks RV

## 2019-09-06 NOTE — Telephone Encounter (Signed)
I have faxed clearance recommendations to Dr. Marius Ditch at [place]. I have also route to [name], NP for appt 09/27/19. I will remove from the pre op call back pool.

## 2019-09-17 ENCOUNTER — Telehealth: Payer: Self-pay | Admitting: Gastroenterology

## 2019-09-17 NOTE — Telephone Encounter (Signed)
Returned patients call.  LVM to remind him of our earlier conversation that his colonoscopy has been canceled until we get clearance from his cardiologist on the 12th or 13th when he goes in for his appt.  Thanks [employer]

## 2019-09-17 NOTE — Telephone Encounter (Signed)
Pt left vm he statets [facility] his procedure on Friday

## 2019-09-21 ENCOUNTER — Ambulatory Visit: Admit: 2019-09-21 | Payer: PPO | Admitting: Gastroenterology

## 2019-09-21 SURGERY — COLONOSCOPY WITH PROPOFOL
Anesthesia: General

## 2019-09-27 ENCOUNTER — Other Ambulatory Visit: Payer: Self-pay

## 2019-09-27 ENCOUNTER — Other Ambulatory Visit
Admission: RE | Admit: 2019-09-27 | Discharge: 2019-09-27 | Disposition: A | Discharge status: Home / Self Care | Payer: PPO | Source: Ambulatory Visit | Attending: Nurse Practitioner | Admitting: Nurse Practitioner

## 2019-09-27 ENCOUNTER — Ambulatory Visit (INDEPENDENT_AMBULATORY_CARE_PROVIDER_SITE_OTHER): Payer: PPO | Admitting: Physician Assistant

## 2019-09-27 ENCOUNTER — Encounter: Payer: Self-pay | Admitting: Nurse Practitioner

## 2019-09-27 VITALS — BP 110/70 | HR 97 | Temp 96.7°F | Ht 67.0 in | Wt 169.5 lb

## 2019-09-27 DIAGNOSIS — J449 Chronic obstructive pulmonary disease, unspecified: Secondary | ICD-10-CM | POA: Diagnosis not present

## 2019-09-27 DIAGNOSIS — E1165 Type 2 diabetes mellitus with hyperglycemia: Secondary | ICD-10-CM | POA: Diagnosis not present

## 2019-09-27 DIAGNOSIS — I10 Essential (primary) hypertension: Secondary | ICD-10-CM

## 2019-09-27 DIAGNOSIS — Z01810 Encounter for preprocedural cardiovascular examination: Secondary | ICD-10-CM | POA: Diagnosis not present

## 2019-09-27 DIAGNOSIS — F17200 Nicotine dependence, unspecified, uncomplicated: Secondary | ICD-10-CM | POA: Diagnosis not present

## 2019-09-27 DIAGNOSIS — Z794 Long term (current) use of insulin: Secondary | ICD-10-CM | POA: Diagnosis not present

## 2019-09-27 DIAGNOSIS — I2510 Atherosclerotic heart disease of native coronary artery without angina pectoris: Secondary | ICD-10-CM | POA: Diagnosis not present

## 2019-09-27 DIAGNOSIS — I483 Typical atrial flutter: Secondary | ICD-10-CM | POA: Diagnosis not present

## 2019-09-27 DIAGNOSIS — I700 Atherosclerosis of aorta: Secondary | ICD-10-CM

## 2019-09-27 DIAGNOSIS — E785 Hyperlipidemia, unspecified: Secondary | ICD-10-CM | POA: Diagnosis not present

## 2019-09-27 DIAGNOSIS — N184 Chronic kidney disease, stage 4 (severe): Secondary | ICD-10-CM

## 2019-09-27 LAB — BASIC METABOLIC PANEL
Anion gap: 7 (ref 5–15)
BUN: 39 mg/dL — ABNORMAL HIGH (ref 8–23)
CO2: 27 mmol/L (ref 22–32)
Calcium: 8.6 mg/dL — ABNORMAL LOW (ref 8.9–10.3)
Chloride: 103 mmol/L (ref 98–111)
Creatinine, Ser: 2.69 mg/dL — ABNORMAL HIGH (ref 0.61–1.24)
GFR calc Af Amer: 26 mL/min — ABNORMAL LOW (ref >60)
GFR calc non Af Amer: 22 mL/min — ABNORMAL LOW (ref >60)
Glucose, Bld: 199 mg/dL — ABNORMAL HIGH (ref 70–99)
Potassium: 4.2 mmol/L (ref 3.5–5.1)
Sodium: 137 mmol/L (ref 135–145)

## 2019-09-27 LAB — MAGNESIUM: Magnesium: 2.3 mg/dL (ref 1.7–2.4)

## 2019-09-27 MED ORDER — METOPROLOL SUCCINATE ER 50 MG PO TB24: ORAL_TABLET | ORAL | 3 refills | Status: DC

## 2019-09-27 NOTE — Progress Notes (Signed)
Office Visit    Patient Name: [name] Date of Encounter: 09/27/2019  Primary Care Provider:  [name], MD Primary Cardiologist:  Ida Rogue, MD  Chief Complaint    73 year old male with history of asymptomatic atrial flutter with 2:1 block on [facility], s/p cardioversion 2015/2016/2019 and ablation 2017/2018, extensive 3v CAD and aortic atherosclerosis by CT 04/2019, HFpEF with EF 60-65% by 2017 echo, hypertension, metastatic small bowel carcinoid, h/o pancreatitis, L adrenal adenoma, CKDIII, DM2 on insulin, HLD, COPD stage B with chronic hypoxic respiratory failure on 2.5L night oxygen, current smoker (1-2 cigarettes / week), polycythemia, hypothyroidism / secondary hyperparathyroidsm, and seen today for follow-up and cardiac evaluation before routine colonoscopy.  Past Medical History    Past Medical History:  Diagnosis Date  . Aortic atherosclerosis ([facility])    As seen on 04/2019 CT  . Atrial flutter ([place])    a. s/p ablation 2017/2018 and DCCV 2015/2016/2019 b. 2017 echo EF 60-65%, no RWMA, mild MR,  . CAD (coronary artery disease)    3v CAD 04/2019 CT  . Carcinoid tumor    metastatic  . CKD (chronic kidney disease), stage III   . COPD (chronic obstructive pulmonary disease) (HCC)    PM oxygen  . Current occasional smoker    1-2 cigarettes / week  . Diabetes mellitus type 2, insulin dependent ([place])   . Heart failure with preserved ejection fraction ([place])    2017 echo EF 60-65%, no RWMA, mild MR,    . Hyperlipidemia   . Hypertension   . Pancreatitis   . Polycythemia    Past Surgical History:  Procedure Laterality Date  . A-FLUTTER ABLATION N/A 10/14/2017   Procedure: A-FLUTTER ABLATION;  Surgeon: Constance Haw, MD;  Location: [place] [facility];  Service: Cardiovascular;  Laterality: N/A;  . CARDIOVERSION N/A 09/08/2017   Procedure: CARDIOVERSION;  Surgeon: [name], MD;  Location: [facility];  Service: Cardiovascular;  Laterality: N/A;  .  CARDIOVERSION N/A 03/22/2018   Procedure: CARDIOVERSION;  Surgeon: [name], MD;  Location: [facility];  Service: Cardiovascular;  Laterality: N/A;  . ELECTROPHYSIOLOGIC STUDY N/A 10/27/2016   Procedure: A-Flutter Ablation;  Surgeon: Deboraha Sprang, MD;  Location: [place] [facility];  Service: Cardiovascular;  Laterality: N/A;  . EUS N/A 12/22/2017   Procedure: FULL UPPER ENDOSCOPIC ULTRASOUND (EUS) RADIAL;  Surgeon: Reita Cliche, MD;  Location: [facility];  Service: Gastroenterology;  Laterality: N/A;  . TEE WITH CARDIOVERSION  12/16    Allergies  Allergies  Allergen Reactions  . Levaquin [Levofloxacin In D5w] Swelling and Rash    History of Present Illness    73 yo male with complex PMH as above and including a long history of atrial flutter. He is s/p cardioversion 2015/2016, ablation 2017/2018, and cardioversion 2019. His last echo was in 2017, at which time he had an EF 60-65% and no regional wall motion abnormalities. He is followed by pulmonology for his COPD and is a current smoker. He also has a history of CKDIII, followed by [facility] and DM2 / hyperparathyroid/hypothyroid followed by endocrinology (recently started on insulin). He is followed by [facility] oncology for metastatic small bowel carcinoid with a recent reported weight loss of almost 40 lbs over 6 months. He had a 04/2019 CT showed 3v CAD and aortic atherosclerosis but no history of cardiac catheterization.   He last saw his primary cardiologist 09/26/2018 for 66-month follow-up.  At that time, he had no complaints of chest  pain, palpitations, or racing heart rate.  He was asymptomatic in atrial flutter.  He did not want a repeat cardioversion or ablation.  He reported that he was active without any significant shortness of breath, weight gain, or abdominal distention.  He had stopped smoking.  Since that time, the patient reports that he has been in his usual state of health and doing well. He denies any recent  chest pain and continues to remain asymptomatic in atrial flutter without feelings of racing heart rate or palpitations.  He denies any symptoms of heart failure including shortness of breath, DOE, early satiety, orthopnea, abdominal distention, or lower extremity edema.  He reports that he is able to wash dishes, vacuum, walk a block, and go up a flight of stairs without any shortness of breath.  He does admit that he would likely become short of breath if running a block or playing a game of doubles tennis.  He denies any recent falls or symptoms of presyncope.  He denies any signs or symptoms of acute bleed, including BRBPR, hematochezia, or melena.  He reports medication compliance, including his Eliquis. He recently started on insulin for his DM2 but reports no other medication changes. He checks his BP 1x per month but cannot recall the specific BP readings. He is smoking approximately 1 to 2 cigarettes/week, reporting today that he has never truly quit smoking and still will smoke a cigarette with his 1-2 beers / week.  He reports a healthy diet; however, he does admit that he still has yet to gain back all of the weight that he lost as reported above.   Home Medications    Prior to Admission medications   Medication Sig Start Date End Date Taking? Authorizing Provider  acetaminophen (TYLENOL) 500 MG tablet Take 500-1,000 mg by mouth every 6 (six) hours as needed for moderate pain or headache.     [provider]  albuterol (PROAIR HFA) 108 (90 Base) MCG/ACT inhaler Inhale 2 puffs into the lungs every 4 (four) hours as needed for wheezing or shortness of breath.     [provider]  budesonide-formoterol (SYMBICORT) 80-4.5 MCG/ACT inhaler Inhale 2 puffs into the lungs 2 (two) times daily. Takes at noon and at 4pm.    [provider]  CARTIA XT 120 MG 24 hr capsule Take 1 capsule by mouth twice daily 08/30/19   [name], MD  ELIQUIS 5 MG TABS tablet Take 1 tablet  by mouth twice daily 06/08/19   [name], MD  HYDROcodone-acetaminophen (NORCO/VICODIN) 5-325 MG tablet Take 1-2 tablets by mouth every 4 (four) hours as needed for moderate pain. 11/28/18   Henreitta Leber, MD  levothyroxine (SYNTHROID, LEVOTHROID) 50 MCG tablet Take 50 mcg by mouth daily. 06/13/18   [provider]  losartan (COZAAR) 25 MG tablet Take 1 tablet (25 mg total) by mouth daily. 03/27/18   [name], MD  metoprolol succinate (TOPROL-XL) 50 MG 24 hr tablet TAKE 1 TABLET BY MOUTH IN THE EVENING. TAKE WITH OR IMMEDIATELY FOLLOWING A MEAL 12/12/18   [name], MD  rosuvastatin (CRESTOR) 40 MG tablet Take 1 tablet by mouth once daily 09/03/19   Theora Gianotti, NP  Tiotropium Bromide Monohydrate (SPIRIVA RESPIMAT) 1.25 MCG/ACT AERS Inhale 1 puff into the lungs daily.     [provider]  vitamin B-12 (CYANOCOBALAMIN) 500 MCG tablet Take 1,000 mcg by mouth daily.     [provider]    Review  of Systems    He denies chest pain, palpitations, dyspnea, pnd, orthopnea, n, v, dizziness, syncope, edema, weight gain, or early satiety.  All other systems reviewed and are otherwise negative except as noted above.  Physical Exam    VS:  BP 110/70 (BP Location: Left Arm, Patient Position: Sitting, Cuff Size: Normal)   Pulse 97   Temp (!) 96.7 F (35.9 C)   Ht 5\' 7"  (1.702 m)   Wt 169 lb 8 oz (76.9 kg)   SpO2 94%   BMI 26.55 kg/m  , BMI Body mass index is 26.55 kg/m. GEN: Well nourished, well developed, in no acute distress. HEENT: normal. Neck: Supple, no JVD, carotid bruits, or masses. Cardiac: RRR with extrasystole appreciated. No murmurs, rubs, or gallops. No clubbing, cyanosis, edema.  Radials/DP/PT 1+ and equal bilaterally.  Respiratory: Diffuse bilateral wheezing and reduced breath sounds bilaterally. GI: Soft, nontender, nondistended, BS + x 4. MS: no deformity or atrophy. Upper extremity ecchymosis noted. Skin: warm and  dry, no rash. Neuro:  Strength and sensation are intact. Psych: Normal affect.  Accessory Clinical Findings    ECG personally reviewed by me today - Atrial flutter with 2:1 block, IVCD with LAFB, LAD, poor R wave progression - no acute changes.   Echo 09/2016 Study Conclusions - Left ventricle: The cavity size was normal. Systolic function was   normal. The estimated ejection fraction was in the range of 60%   to 65%. Wall motion was normal; there were no regional wall   motion abnormalities. The study is not technically sufficient to   allow evaluation of LV diastolic function. - Mitral valve: There was mild regurgitation. - Left atrium: The atrium was normal in size. - Right ventricle: Systolic function was normal. - Pulmonary arteries: Systolic pressure was within the normal   range.  Labs 05/08/2019 Na 136, K 4.1, Cr 2.42, BUN 34, Ca 8.6, glucose 199, Albumin 3.3, Hgb 14.6, RBC 4.70  Labs today 09/27/2019 Na 137, K 4.2, Cr 2.69. BUN 39, Ca 8.6, glucose 199  Assessment & Plan    Preoperative evaluation, routine colonoscopy --No concerning symptoms of chest pain or SOB. 2017 echo with nl EF 6-65% (2017). EKG today without acute ST/T abnormality. Clinical risk factors include HFpEF, renal insufficiency with Cr above 2.0 (11/12 Cr 2.69), and DM2 on insulin.  Function capacity moderate to good (4-10 METs) as above in HPI. [facility] activity status index ~28.7. Surgery specific risk low for routine colonoscopy. RCRI 2 RF  6.6% risk of major cardiac event.  --Reasonable to proceed with colonoscopy without additional cardiac testing or intervention. --Apixaban should be held 3 days prior to surgery with reinitiation per GI following the colonoscopy. Continue statin, BB, CCB.  --Hold Losartan before the colonoscopy prep given Cr elevation and per nephrology. Restart Losartan after the procedure with recheck of BMET in 2 weeks.    Atrial Flutter with 2:1 block --Remains asymptomatic in  atrial flutter with 2:1 block and PVCs and rate controlled. History of repeat cardioversions and ablations as above in HPI with no plan for repeat ablation / cardioversion per patient preference. CHA2DS2VASc score of at least 6 (CHF, HTN, agex2, DM2, vascular) on [place] and tolerating Eliquis well without s/sx of bleeding.  --Continue Cartia XT 120 mg twice daily and Toprol-XL 50 mg qd for rate control. Continue Eliquis 5mg  BID for anticoagulation with instructions to hold Eliquis 3 days before colonoscopy with restart per GI.  3v CAD and Aortic atherosclerosis by CT scan --No  concerning symptoms of chest pain or SOB. Most recent echo as above with normal EF and without RWMA. No plan for further ischemic workup at this time. Continue medical management and risk factor modification with statin.  CKD --BMET today showed Cr 2.69 with BUN 39, K 4.2. Mg 2.3. As above, reached out to nephrology given elevated Cr on labs today.  --Hold Losartan before colonoscopy prep. Restart after the procedure with recheck of BMET in 2 weeks.   HTN --BP well controlled. Encouraged patient to check BP at home as currently only checks 1x each month. Continue Losartan 25mg  daily, ToprolXL 50mg  daily, Cartia XT 120mg  BID.  --As above, hold Losartan before colonoscopy prep. Restart after the procedure with recheck of BMET in 2 weeks.  HLD --LDL well controlled with 11/2018 LDL of 27. Continue statin therapy with Crestor 40mg  daily.  Current smoker / COPD --Smokes 1-2 cigarettes a week. Cessation advised with patient indicating he will continue to try to completely quit smoking. Followed by pulmonology.  DM2, poorly controlled, on insulin --Followed by [facility] endocrinology with recent start of insulin. Last A1C 10/2014 of 7.0. Glucose today 199. Discussed importance of healthy diet and control of diabetes.   Disposition:  Obtained BMET and Mg today as above.  Recheck BMET in 2 weeks s/p colonoscopy.  Refill Toprol XL.  Follow-up in 6 months with Dr. Rockey Situ or APP.   Arvil Chaco, PA-C 09/27/2019, 4:52 PM

## 2019-09-27 NOTE — Patient Instructions (Signed)
Medication Instructions:  Your physician recommends that you continue on your current medications as directed. Please refer to the Current Medication list given to you today.  *If you need a refill on your cardiac medications before your next appointment, please call your pharmacy*  Lab Work: Your physician recommends that you return for lab work today at the [facility] mall. (BMET, Mag)  No appt is needed. Hours are M-F 7AM- 6 PM.  If you have labs (blood work) drawn today and your tests are completely normal, you will receive your results only by: Marland Kitchen MyChart Message (if you have MyChart) OR . A paper copy in the mail If you have any lab test that is abnormal or we need to change your treatment, we will call you to review the results.  Testing/Procedures: None ordered   Follow-Up: At [facility], you and your health needs are our priority.  As part of our continuing mission to provide you with exceptional heart care, we have created designated Provider Care Teams.  These Care Teams include your primary Cardiologist (physician) and Advanced Practice Providers (APPs -  Physician Assistants and Nurse Practitioners) who all work together to provide you with the care you need, when you need it.  Your next appointment:   6 months  The format for your next appointment:   In Person  Provider:     You may see Ida Rogue, MD or Marrianne Mood, PA-C.   Other Instructions

## 2019-09-28 ENCOUNTER — Telehealth: Payer: Self-pay

## 2019-09-28 NOTE — Telephone Encounter (Signed)
-----   Message from Lin Landsman, MD sent at 09/28/2019  8:31 AM EST ----- Sharyn Lull  Please see message from cards below about holding losartan. Go ahead and schedule him for screening colonoscopy and let them know the date  Thanks RV ----- Message ----- From: Arvil Chaco, PA-C Sent: 09/27/2019   5:09 PM EST To: Lin Landsman, MD, [facility] Triage  Please let the patient know that his most recent labs showed a bump in his renal function.   Please have him hold his Losartan the day of his colonoscopy prep with restart the day after the procedure and a follow-up BMET/ Mg 2 weeks after the procedure.   I will Cc Dr. Marius Ditch on this result note, as we currently do not know the patient's scheduled date of his colonoscopy.  Once we know this colonoscopy date, we can schedule his follow-up BMET/Mg for 2 weeks after the procedure.

## 2019-09-28 NOTE — Telephone Encounter (Signed)
Called patient to let him know that Dr. Janese Banks stated that we would cancel the CT Scan from here and that he should have the one from [facility]. Patient was told that we would call to cancel ours. Patient agreed and understood.

## 2019-09-28 NOTE — Telephone Encounter (Signed)
LVM for pt to call office in regards to scheduling his colonoscopy. -Advise patient to hold Losartan the day of his colonoscopy prep and restart 1 day after his cololonoscopy. -F/IU w/ PCP 2 weeks after to recheck Renal Function (BMet, and Mg)  Thanks,  Sharyn Lull

## 2019-10-01 ENCOUNTER — Telehealth: Payer: Self-pay | Admitting: Cardiovascular Disease

## 2019-10-01 DIAGNOSIS — I10 Essential (primary) hypertension: Secondary | ICD-10-CM

## 2019-10-01 NOTE — Telephone Encounter (Signed)
Patient calling to discuss recent testing results  ° °Please call  ° °

## 2019-10-01 NOTE — Telephone Encounter (Signed)
Made call to patient to discuss results and POC from Elenor Quinones, PA for preprocedural instructions.   Pt verbalized understanding and requested that I send instructions in writing. I agreed.   Letter sent.   Advised pt to call for any further questions or concerns.

## 2019-10-05 ENCOUNTER — Other Ambulatory Visit: Payer: Self-pay

## 2019-10-05 ENCOUNTER — Telehealth: Payer: Self-pay

## 2019-10-05 DIAGNOSIS — Z1211 Encounter for screening for malignant neoplasm of colon: Secondary | ICD-10-CM

## 2019-10-05 NOTE — Telephone Encounter (Signed)
Patients colonoscopy has been scheduled with Dr. Marius Ditch Wed 10/31/19 at [facility].  He has been advised the following:  1. [facility] Test Friday [facility] at [facility] between 10:30am and 12:30pm  2. Hold Eliquis 3 days prior to colonoscopy. Resume 1 day after.  3. Hold Losartan 1 day prior to colonoscopy. Restart 1 day after.  4. Follow up with Dr. Donivan Scull office BMET/ Mg 2 weeks after the procedure.  New instructions will be sent to patient. Patient has been asked to call me back at anytime between now and procedure date if he is unclear on anything.  Thanks [employer]

## 2019-10-05 NOTE — Telephone Encounter (Signed)
-----   Message from Arvil Chaco, PA-C sent at 10/05/2019  2:36 PM EST ----- Please schedule Mr. Resor for a BMET/Mg two weeks after 10/31/19 (the date of his colonoscopy). He should hold his Losartan the day of the colonoscopy prep.

## 2019-10-23 DIAGNOSIS — K862 Cyst of pancreas: Secondary | ICD-10-CM | POA: Diagnosis not present

## 2019-10-23 DIAGNOSIS — K869 Disease of pancreas, unspecified: Secondary | ICD-10-CM | POA: Diagnosis not present

## 2019-10-23 DIAGNOSIS — R1907 Generalized intra-abdominal and pelvic swelling, mass and lump: Secondary | ICD-10-CM | POA: Diagnosis not present

## 2019-10-23 DIAGNOSIS — C7B8 Other secondary neuroendocrine tumors: Secondary | ICD-10-CM | POA: Diagnosis not present

## 2019-10-23 DIAGNOSIS — D3A8 Other benign neuroendocrine tumors: Secondary | ICD-10-CM | POA: Diagnosis not present

## 2019-10-23 DIAGNOSIS — Z87891 Personal history of nicotine dependence: Secondary | ICD-10-CM | POA: Diagnosis not present

## 2019-10-23 DIAGNOSIS — C7A8 Other malignant neuroendocrine tumors: Secondary | ICD-10-CM | POA: Diagnosis not present

## 2019-10-23 DIAGNOSIS — K859 Acute pancreatitis without necrosis or infection, unspecified: Secondary | ICD-10-CM | POA: Diagnosis not present

## 2019-10-26 ENCOUNTER — Other Ambulatory Visit
Admission: RE | Admit: 2019-10-26 | Discharge: 2019-10-26 | Disposition: A | Discharge status: Home / Self Care | Payer: PPO | Source: Ambulatory Visit | Attending: Gastroenterology | Admitting: Gastroenterology

## 2019-10-26 DIAGNOSIS — Z20828 Contact with and (suspected) exposure to other viral communicable diseases: Secondary | ICD-10-CM | POA: Diagnosis not present

## 2019-10-26 DIAGNOSIS — Z01812 Encounter for preprocedural laboratory examination: Secondary | ICD-10-CM | POA: Insufficient documentation

## 2019-10-27 LAB — SARS CORONAVIRUS 2 (TAT 6-24 HRS): SARS Coronavirus 2: NEGATIVE

## 2019-10-30 ENCOUNTER — Ambulatory Visit: Payer: PPO

## 2019-10-31 ENCOUNTER — Encounter: Payer: Self-pay | Admitting: Gastroenterology

## 2019-10-31 ENCOUNTER — Ambulatory Visit: Payer: PPO | Admitting: Anesthesiology

## 2019-10-31 ENCOUNTER — Ambulatory Visit
Admission: RE | Admit: 2019-10-31 | Discharge: 2019-10-31 | Disposition: A | Discharge status: Home / Self Care | Payer: PPO | Attending: Gastroenterology | Admitting: Gastroenterology

## 2019-10-31 ENCOUNTER — Encounter
Admission: RE | Disposition: A | Discharge status: Home / Self Care | Payer: Self-pay | Source: Home / Self Care | Attending: Gastroenterology

## 2019-10-31 ENCOUNTER — Other Ambulatory Visit: Payer: Self-pay

## 2019-10-31 DIAGNOSIS — Z9981 Dependence on supplemental oxygen: Secondary | ICD-10-CM | POA: Insufficient documentation

## 2019-10-31 DIAGNOSIS — K5520 Angiodysplasia of colon without hemorrhage: Secondary | ICD-10-CM | POA: Diagnosis not present

## 2019-10-31 DIAGNOSIS — E1122 Type 2 diabetes mellitus with diabetic chronic kidney disease: Secondary | ICD-10-CM | POA: Diagnosis not present

## 2019-10-31 DIAGNOSIS — Z79890 Hormone replacement therapy (postmenopausal): Secondary | ICD-10-CM | POA: Insufficient documentation

## 2019-10-31 DIAGNOSIS — D122 Benign neoplasm of ascending colon: Secondary | ICD-10-CM | POA: Diagnosis not present

## 2019-10-31 DIAGNOSIS — K635 Polyp of colon: Secondary | ICD-10-CM

## 2019-10-31 DIAGNOSIS — I2510 Atherosclerotic heart disease of native coronary artery without angina pectoris: Secondary | ICD-10-CM | POA: Insufficient documentation

## 2019-10-31 DIAGNOSIS — I130 Hypertensive heart and chronic kidney disease with heart failure and stage 1 through stage 4 chronic kidney disease, or unspecified chronic kidney disease: Secondary | ICD-10-CM | POA: Insufficient documentation

## 2019-10-31 DIAGNOSIS — K5730 Diverticulosis of large intestine without perforation or abscess without bleeding: Secondary | ICD-10-CM | POA: Diagnosis not present

## 2019-10-31 DIAGNOSIS — Z7951 Long term (current) use of inhaled steroids: Secondary | ICD-10-CM | POA: Insufficient documentation

## 2019-10-31 DIAGNOSIS — N183 Chronic kidney disease, stage 3 unspecified: Secondary | ICD-10-CM | POA: Insufficient documentation

## 2019-10-31 DIAGNOSIS — D125 Benign neoplasm of sigmoid colon: Secondary | ICD-10-CM | POA: Diagnosis not present

## 2019-10-31 DIAGNOSIS — D123 Benign neoplasm of transverse colon: Secondary | ICD-10-CM | POA: Diagnosis not present

## 2019-10-31 DIAGNOSIS — Z79899 Other long term (current) drug therapy: Secondary | ICD-10-CM | POA: Diagnosis not present

## 2019-10-31 DIAGNOSIS — Q2733 Arteriovenous malformation of digestive system vessel: Secondary | ICD-10-CM | POA: Diagnosis not present

## 2019-10-31 DIAGNOSIS — E785 Hyperlipidemia, unspecified: Secondary | ICD-10-CM | POA: Insufficient documentation

## 2019-10-31 DIAGNOSIS — Z1211 Encounter for screening for malignant neoplasm of colon: Secondary | ICD-10-CM | POA: Diagnosis not present

## 2019-10-31 DIAGNOSIS — I509 Heart failure, unspecified: Secondary | ICD-10-CM | POA: Diagnosis not present

## 2019-10-31 DIAGNOSIS — D124 Benign neoplasm of descending colon: Secondary | ICD-10-CM | POA: Diagnosis not present

## 2019-10-31 DIAGNOSIS — J449 Chronic obstructive pulmonary disease, unspecified: Secondary | ICD-10-CM | POA: Insufficient documentation

## 2019-10-31 DIAGNOSIS — F17210 Nicotine dependence, cigarettes, uncomplicated: Secondary | ICD-10-CM | POA: Diagnosis not present

## 2019-10-31 DIAGNOSIS — K5790 Diverticulosis of intestine, part unspecified, without perforation or abscess without bleeding: Secondary | ICD-10-CM | POA: Diagnosis not present

## 2019-10-31 DIAGNOSIS — D120 Benign neoplasm of cecum: Secondary | ICD-10-CM | POA: Insufficient documentation

## 2019-10-31 DIAGNOSIS — I129 Hypertensive chronic kidney disease with stage 1 through stage 4 chronic kidney disease, or unspecified chronic kidney disease: Secondary | ICD-10-CM | POA: Diagnosis not present

## 2019-10-31 DIAGNOSIS — Z881 Allergy status to other antibiotic agents status: Secondary | ICD-10-CM | POA: Insufficient documentation

## 2019-10-31 DIAGNOSIS — Z7901 Long term (current) use of anticoagulants: Secondary | ICD-10-CM | POA: Insufficient documentation

## 2019-10-31 DIAGNOSIS — Z794 Long term (current) use of insulin: Secondary | ICD-10-CM | POA: Diagnosis not present

## 2019-10-31 HISTORY — PX: COLONOSCOPY WITH PROPOFOL: SHX5780

## 2019-10-31 LAB — GLUCOSE, CAPILLARY: Glucose-Capillary: 149 mg/dL — ABNORMAL HIGH (ref 70–99)

## 2019-10-31 SURGERY — COLONOSCOPY WITH PROPOFOL
Anesthesia: General

## 2019-10-31 MED ORDER — PROPOFOL 500 MG/50ML IV EMUL
INTRAVENOUS | Status: DC | PRN
  Administered 2019-10-31: 100 ug/kg/min via INTRAVENOUS

## 2019-10-31 MED ORDER — PROPOFOL 10 MG/ML IV BOLUS
INTRAVENOUS | Status: DC | PRN
  Administered 2019-10-31: 50 mg via INTRAVENOUS

## 2019-10-31 MED ORDER — SODIUM CHLORIDE 0.9 % IV SOLN: INTRAVENOUS | Status: DC

## 2019-10-31 MED ORDER — PHENYLEPHRINE HCL (PRESSORS) 10 MG/ML IV SOLN
INTRAVENOUS | Status: DC | PRN
  Administered 2019-10-31 (×2): 200 ug via INTRAVENOUS
  Administered 2019-10-31 (×4): 100 ug via INTRAVENOUS
  Administered 2019-10-31 (×4): 200 ug via INTRAVENOUS

## 2019-10-31 MED ORDER — MIDAZOLAM HCL 5 MG/5ML IJ SOLN
INTRAMUSCULAR | Status: DC | PRN
  Administered 2019-10-31: 1 mg via INTRAVENOUS

## 2019-10-31 MED ORDER — METOPROLOL TARTRATE 5 MG/5ML IV SOLN
INTRAVENOUS | Status: AC
  Filled 2019-10-31: qty 5

## 2019-10-31 MED ORDER — LIDOCAINE 2% (20 MG/ML) 5 ML SYRINGE
INTRAMUSCULAR | Status: DC | PRN
  Administered 2019-10-31: 20 mg via INTRAVENOUS

## 2019-10-31 MED ORDER — PROPOFOL 500 MG/50ML IV EMUL
INTRAVENOUS | Status: AC
  Filled 2019-10-31: qty 50

## 2019-10-31 MED ORDER — MIDAZOLAM HCL 2 MG/2ML IJ SOLN
INTRAMUSCULAR | Status: AC
  Filled 2019-10-31: qty 2

## 2019-10-31 NOTE — Anesthesia Preprocedure Evaluation (Signed)
Anesthesia Evaluation  Patient identified by MRN, date of birth, ID band Patient awake    Reviewed: Allergy & Precautions, H&P , NPO status , Patient's Chart, lab work & pertinent test results, reviewed documented beta blocker date and time   Airway Mallampati: II   Neck ROM: full    Dental  (+) Poor Dentition   Pulmonary COPD,  COPD inhaler, Current Smoker and Patient abstained from smoking.,    Pulmonary exam normal        Cardiovascular Exercise Tolerance: Good hypertension, On Medications + CAD  Normal cardiovascular exam Rhythm:regular Rate:Normal     Neuro/Psych negative neurological ROS  negative psych ROS   GI/Hepatic negative GI ROS, Neg liver ROS,   Endo/Other  negative endocrine ROSdiabetes  Renal/GU CRFRenal disease  negative genitourinary   Musculoskeletal   Abdominal   Peds  Hematology negative hematology ROS (+)   Anesthesia Other Findings Past Medical History: No date: Aortic atherosclerosis ([place])     Comment:  As seen on 04/2019 CT No date: Atrial flutter [facility])     Comment:  a. s/p ablation 2017/2018 and DCCV 2015/2016/2019 b.               2017 echo EF 60-65%, no RWMA, mild MR, No date: CAD (coronary artery disease)     Comment:  3v CAD 04/2019 CT No date: Carcinoid tumor     Comment:  metastatic No date: CKD (chronic kidney disease), stage III No date: COPD (chronic obstructive pulmonary disease) ([facility])     Comment:  PM oxygen No date: Current occasional smoker     Comment:  1-2 cigarettes / week No date: Diabetes mellitus type 2, insulin dependent ([place]) No date: Heart failure with preserved ejection fraction ([place])     Comment:  2017 echo EF 60-65%, no RWMA, mild MR,   No date: Hyperlipidemia No date: Hypertension No date: Pancreatitis No date: Polycythemia Past Surgical History: 10/14/2017: A-FLUTTER ABLATION; N/A     Comment:  Procedure: A-FLUTTER ABLATION;  Surgeon: Constance Haw, MD;  Location: [place] CV LAB;  Service:               Cardiovascular;  Laterality: N/A; 09/08/2017: CARDIOVERSION; N/A     Comment:  Procedure: CARDIOVERSION;  Surgeon: [name],               MD;  Location: [facility] ORS;  Service: Cardiovascular;                Laterality: N/A; 03/22/2018: CARDIOVERSION; N/A     Comment:  Procedure: CARDIOVERSION;  Surgeon: [name],               MD;  Location: [facility] ORS;  Service: Cardiovascular;                Laterality: N/A; No date: COLONOSCOPY 10/27/2016: ELECTROPHYSIOLOGIC STUDY; N/A     Comment:  Procedure: A-Flutter Ablation;  Surgeon: Deboraha Sprang,              MD;  Location: [place] CV LAB;  Service:               Cardiovascular;  Laterality: N/A; 12/22/2017: EUS; N/A     Comment:  Procedure: FULL UPPER ENDOSCOPIC ULTRASOUND (EUS)               RADIAL;  Surgeon: Reita Cliche, MD;  Location:  [facility]               ENDOSCOPY;  Service: Gastroenterology;  Laterality: N/A; 12/16: TEE WITH CARDIOVERSION   Reproductive/Obstetrics negative OB ROS                             Anesthesia Physical Anesthesia Plan  ASA: III  Anesthesia Plan: General   Post-op Pain Management:    Induction:   PONV Risk Score and Plan:   Airway Management Planned:   Additional Equipment:   Intra-op Plan:   Post-operative Plan:   Informed Consent: I have reviewed the patients History and Physical, chart, labs and discussed the procedure including the risks, benefits and alternatives for the proposed anesthesia with the patient or authorized representative who has indicated his/her understanding and acceptance.     Dental Advisory Given  Plan Discussed with: CRNA  Anesthesia Plan Comments:         Anesthesia Quick Evaluation

## 2019-10-31 NOTE — Transfer of Care (Signed)
Immediate Anesthesia Transfer of Care Note  Patient: [name]  Procedure(s) Performed: COLONOSCOPY WITH PROPOFOL (N/A )  Patient Location: Endoscopy Unit  Anesthesia Type:General  Level of Consciousness: awake  Airway & Oxygen Therapy: Patient Spontanous Breathing and Patient connected to nasal cannula oxygen  Post-op Assessment: Report given to RN and Post -op Vital signs reviewed and stable  Post vital signs: Reviewed  Last Vitals:  Vitals Value Taken Time  BP 108/67 10/31/19 1206  Temp    Pulse 99 10/31/19 1207  Resp 19 10/31/19 1207  SpO2 100 % 10/31/19 1207  Vitals shown include unvalidated device data.  Last Pain: There were no vitals filed for this visit.       Complications: No apparent anesthesia complications

## 2019-10-31 NOTE — Op Note (Signed)
[facility] Gastroenterology Patient Name: [name] Procedure Date: 10/31/2019 7:22 AM MRN: [id number] Account #: [id number] Date of Birth: Mar 19, 1946 Admit Type: Outpatient Age: 73 Room: [facility] ENDO ROOM 3 Gender: Male Note Status: Finalized Procedure:             Colonoscopy Indications:           Screening for colorectal malignant neoplasm, Last                         colonoscopy: April 2011 Providers:             Lin Landsman MD, MD Referring MD:          [name], MD (Referring MD) Medicines:             Monitored Anesthesia Care Complications:         No immediate complications. Estimated blood loss: None. Procedure:             Pre-Anesthesia Assessment:                        - Prior to the procedure, a History and Physical was                         performed, and patient medications and allergies were                         reviewed. The patient is competent. The risks and                         benefits of the procedure and the sedation options and                         risks were discussed with the patient. All questions                         were answered and informed consent was obtained.                         Patient identification and proposed procedure were                         verified by the physician, the nurse, the                         anesthesiologist, the anesthetist and the technician                         in the pre-procedure area in the procedure room in the                         endoscopy suite. Mental Status Examination: alert and                         oriented. Airway Examination: normal oropharyngeal                         airway and neck mobility. Respiratory Examination:  clear to auscultation. CV Examination: normal.                         Prophylactic Antibiotics: The patient does not require                         prophylactic antibiotics. Prior Anticoagulants: The                        patient has taken Eliquis (apixaban), last dose was 3                         days prior to procedure. ASA Grade Assessment: III - A                         patient with severe systemic disease. After reviewing                         the risks and benefits, the patient was deemed in                         satisfactory condition to undergo the procedure. The                         anesthesia plan was to use monitored anesthesia care                         (MAC). Immediately prior to administration of                         medications, the patient was re-assessed for adequacy                         to receive sedatives. The heart rate, respiratory                         rate, oxygen saturations, blood pressure, adequacy of                         pulmonary ventilation, and response to care were                         monitored throughout the procedure. The physical                         status of the patient was re-assessed after the                         procedure.                        After obtaining informed consent, the colonoscope was                         passed under direct vision. Throughout the procedure,                         the patient's blood pressure, pulse, and oxygen  saturations were monitored continuously. The                         Colonoscope was introduced through the anus and                         advanced to the the cecum, identified by appendiceal                         orifice and ileocecal valve. The colonoscopy was                         performed without difficulty. The patient tolerated                         the procedure well. The quality of the bowel                         preparation was evaluated using the BBPS [facility] Bowel                         Preparation Scale) with scores of: Right Colon = 3,                         Transverse Colon = 3 and Left Colon = 3 (entire mucosa                          seen well with no residual staining, small fragments                         of stool or opaque liquid). The total BBPS score                         equals 9. Findings:      The perianal and digital rectal examinations were normal. Pertinent       negatives include normal sphincter tone and no palpable rectal lesions.      A 6 mm polyp was found in the cecum. The polyp was sessile. The polyp       was removed with a cold snare. Resection and retrieval were complete.      A 12 mm polyp was found in the ascending colon. The polyp was sessile.       The polyp was removed with a hot snare. Resection and retrieval were       complete.      A 12 mm polyp was found in the transverse colon. The polyp was flat.       Preparations were made for mucosal resection. Eleview was injected to       raise the lesion. Snare mucosal resection was performed. Resection and       retrieval were complete. To prevent bleeding after mucosal resection,       three hemostatic clips were successfully placed (MR conditional). There       was no bleeding during, or at the end, of the procedure.      A 8 mm polyp was found in the descending colon. The polyp was sessile.       The polyp was removed with a hot snare. Resection and retrieval were  complete.      Five sessile polyps were found in the descending colon. The polyps were       4 to 5 mm in size. These polyps were removed with a cold snare.       Resection and retrieval were complete.      Four sessile polyps were found in the sigmoid colon. The polyps were 3       to 5 mm in size. These polyps were removed with a cold snare. Resection       and retrieval were complete.      Two small angioectasias without bleeding were found in the cecum.       Coagulation for bleeding prevention using argon plasma was successful.      Multiple diverticula were found in the sigmoid colon and transverse       colon.      The retroflexed view of the distal  rectum and anal verge was normal and       showed no anal or rectal abnormalities.      The terminal ileum appeared normal. Impression:            - One 6 mm polyp in the cecum, removed with a cold                         snare. Resected and retrieved.                        - One 12 mm polyp in the ascending colon, removed with                         a hot snare. Resected and retrieved.                        - One 12 mm polyp in the transverse colon, removed                         with mucosal resection. Resected and retrieved. Clips                         (MR conditional) were placed.                        - One 8 mm polyp in the descending colon, removed with                         a hot snare. Resected and retrieved.                        - Five 4 to 5 mm polyps in the descending colon,                         removed with a cold snare. Resected and retrieved.                        - Four 3 to 5 mm polyps in the sigmoid colon, removed                         with a cold snare. Resected and retrieved.                        -  Two non-bleeding colonic angioectasias. Treated with                         argon plasma coagulation (APC).                        - Diverticulosis in the sigmoid colon and in the                         transverse colon.                        - The distal rectum and anal verge are normal on                         retroflexion view.                        - Mucosal resection was performed. Resection and                         retrieval were complete. Recommendation:        - Discharge patient to home (with escort).                        - Resume previous diet today.                        - Continue present medications.                        - Resume Eliquis (apixaban) at prior dose in 2 days.                         Refer to managing physician for further adjustment of                         therapy.                        - Await pathology  results.                        - Repeat colonoscopy in 1 year for surveillance of                         multiple polyps. Procedure Code(s):     --- Professional ---                        [phone], Colonoscopy, flexible; with endoscopic mucosal                         resection                        45382, 23, Colonoscopy, flexible; with control of                         bleeding, any method                        45385, 59, Colonoscopy,  flexible; with removal of                         tumor(s), polyp(s), or other lesion(s) by snare                         technique Diagnosis Code(s):     --- Professional ---                        K63.5, Polyp of colon                        Z12.11, Encounter for screening for malignant neoplasm                         of colon                        K55.20, Angiodysplasia of colon without hemorrhage                        K57.30, Diverticulosis of large intestine without                         perforation or abscess without bleeding CPT copyright 2019 American Medical Association. All rights reserved. The codes documented in this report are preliminary and upon coder review may  be revised to meet current compliance requirements. Dr. Ulyess Mort Lin Landsman MD, MD 10/31/2019 12:06:11 PM This report has been signed electronically. Number of Addenda: 0 Note Initiated On: 10/31/2019 7:22 AM Scope Withdrawal Time: 0 hours 54 minutes 19 seconds  Total Procedure Duration: 0 hours 56 minutes 38 seconds  Estimated Blood Loss:  Estimated blood loss: none.      [facility]

## 2019-10-31 NOTE — H&P (Addendum)
Cephas Darby, MD [address]  [place]  [place], [place] [address]  Main: [phone]  Fax: [phone] Pager: [phone]  Primary Care Physician:  [name], MD Primary Gastroenterologist:  Dr. Cephas Darby  Pre-Procedure History & Physical: HPI:  [name] is a 73 y.o. male is here for an colonoscopy.   Past Medical History:  Diagnosis Date   Aortic atherosclerosis ([place])    As seen on 04/2019 CT   Atrial flutter ([place])    a. s/p ablation 2017/2018 and DCCV 2015/2016/2019 b. 2017 echo EF 60-65%, no RWMA, mild MR,   CAD (coronary artery disease)    3v CAD 04/2019 CT   Carcinoid tumor    metastatic   CKD (chronic kidney disease), stage III    COPD (chronic obstructive pulmonary disease) ([place])    PM oxygen   Current occasional smoker    1-2 cigarettes / week   Diabetes mellitus type 2, insulin dependent ([place])    Heart failure with preserved ejection fraction ([place])    2017 echo EF 60-65%, no RWMA, mild MR,     Hyperlipidemia    Hypertension    Pancreatitis    Polycythemia     Past Surgical History:  Procedure Laterality Date   A-FLUTTER ABLATION N/A 10/14/2017   Procedure: A-FLUTTER ABLATION;  Surgeon: Constance Haw, MD;  Location: [place] CV LAB;  Service: Cardiovascular;  Laterality: N/A;   CARDIOVERSION N/A 09/08/2017   Procedure: CARDIOVERSION;  Surgeon: [name], MD;  Location: [place] ORS;  Service: Cardiovascular;  Laterality: N/A;   CARDIOVERSION N/A 03/22/2018   Procedure: CARDIOVERSION;  Surgeon: [name], MD;  Location: [facility] ORS;  Service: Cardiovascular;  Laterality: N/A;   COLONOSCOPY     ELECTROPHYSIOLOGIC STUDY N/A 10/27/2016   Procedure: A-Flutter Ablation;  Surgeon: Deboraha Sprang, MD;  Location: [place] CV LAB;  Service: Cardiovascular;  Laterality: N/A;   EUS N/A 12/22/2017   Procedure: FULL UPPER ENDOSCOPIC ULTRASOUND (EUS) RADIAL;  Surgeon: Reita Cliche, MD;  Location: [facility] ENDOSCOPY;   Service: Gastroenterology;  Laterality: N/A;   TEE WITH CARDIOVERSION  12/16    Prior to Admission medications   Medication Sig Start Date End Date Taking? Authorizing Provider  acetaminophen (TYLENOL) 500 MG tablet Take 500-1,000 mg by mouth every 6 (six) hours as needed for moderate pain or headache.    Yes [provider]  albuterol (PROAIR HFA) 108 (90 Base) MCG/ACT inhaler Inhale 2 puffs into the lungs every 4 (four) hours as needed for wheezing or shortness of breath.    Yes [provider]  budesonide-formoterol (SYMBICORT) 80-4.5 MCG/ACT inhaler Inhale 2 puffs into the lungs 2 (two) times daily. Takes at noon and at 4pm.   Yes [provider]  CARTIA XT 120 MG 24 hr capsule Take 1 capsule by mouth twice daily 08/30/19  Yes Gollan, Kathlene November, MD  HYDROcodone-acetaminophen (NORCO/VICODIN) 5-325 MG tablet Take 1-2 tablets by mouth every 4 (four) hours as needed for moderate pain. 11/28/18  Yes Sainani, Belia Heman, MD  levothyroxine (SYNTHROID, LEVOTHROID) 50 MCG tablet Take 50 mcg by mouth daily. 06/13/18  Yes [provider]  losartan (COZAAR) 25 MG tablet Take 1 tablet (25 mg total) by mouth daily. 03/27/18  Yes Gollan, Kathlene November, MD  metoprolol succinate (TOPROL-XL) 50 MG 24 hr tablet TAKE 1 TABLET BY MOUTH IN THE EVENING. TAKE WITH OR IMMEDIATELY FOLLOWING A MEAL 09/27/19  Yes Mickle Plumb, Jacquelyn D, PA-C  rosuvastatin (CRESTOR)  40 MG tablet Take 1 tablet by mouth once daily 09/03/19  Yes Theora Gianotti, NP  vitamin B-12 (CYANOCOBALAMIN) 500 MCG tablet Take 1,000 mcg by mouth daily.    Yes [provider]  ELIQUIS 5 MG TABS tablet Take 1 tablet by mouth twice daily 06/08/19   [name], MD  insulin detemir (LEVEMIR) 100 UNIT/ML injection Inject 16 Units into the skin daily.    [provider]    Allergies as of 10/05/2019 - Review Complete 09/27/2019  Allergen Reaction Noted   Levaquin [levofloxacin in d5w] Swelling and  Rash 11/27/2014    Family History  Problem Relation Age of Onset   Hypertension Father     Social History   Socioeconomic History   Marital status: Widowed    Spouse name: Not on file   Number of children: Not on file   Years of education: Not on file   Highest education level: Not on file  Occupational History   Not on file  Tobacco Use   Smoking status: Light Tobacco Smoker    Years: 45.00    Types: Cigarettes    Last attempt to quit: 11/27/2012    Years since quitting: 6.9   Smokeless tobacco: Never Used  Substance and Sexual Activity   Alcohol use: Yes    Alcohol/week: 2.0 standard drinks    Types: 2 Cans of beer per week    Comment: beer every once a while once a monthnone last 24hrs   Drug use: No   Sexual activity: Not on file  Other Topics Concern   Not on file  Social History Narrative   Not on file   Social Determinants of Health   Financial Resource Strain:    Difficulty of Paying Living Expenses: Not on file  Food Insecurity:    Worried About [place] in the Last Year: Not on file   Ran Out of Food in the Last Year: Not on file  Transportation Needs:    Lack of Transportation (Medical): Not on file   Lack of Transportation (Non-Medical): Not on file  Physical Activity:    Days of Exercise per Week: Not on file   Minutes of Exercise per Session: Not on file  Stress:    Feeling of Stress : Not on file  Social Connections:    Frequency of Communication with Friends and Family: Not on file   Frequency of Social Gatherings with Friends and Family: Not on file   Attends Religious Services: Not on file   Active Member of Clubs or Organizations: Not on file   Attends Archivist Meetings: Not on file   Marital Status: Not on file  Intimate Partner Violence:    Fear of Current or Ex-Partner: Not on file   Emotionally Abused: Not on file   Physically Abused: Not on file   Sexually Abused: Not on file    Review of Systems: See  HPI, otherwise negative ROS  Physical Exam: BP 111/70   Resp (!) 21   SpO2 94%  General:   Alert,  pleasant and cooperative in NAD Head:  Normocephalic and atraumatic. Neck:  Supple; no masses or thyromegaly. Lungs:  Clear throughout to auscultation.    Heart:  Regular rate and rhythm. Abdomen:  Soft, nontender and nondistended. Normal bowel sounds, without guarding, and without rebound.   Neurologic:  Alert and  oriented x4;  grossly normal neurologically.  Impression/Plan: [name] is here for an colonoscopy to  be performed for colon cancer screening  Risks, benefits, limitations, and alternatives regarding  colonoscopy have been reviewed with the patient.  Questions have been answered.  All parties agreeable.   Sherri Sear, MD  10/31/2019, 10:58 AM

## 2019-10-31 NOTE — Anesthesia Post-op Follow-up Note (Signed)
Anesthesia QCDR form completed.        

## 2019-11-01 ENCOUNTER — Encounter: Payer: Self-pay | Admitting: *Deleted

## 2019-11-01 ENCOUNTER — Encounter: Payer: Self-pay | Admitting: Gastroenterology

## 2019-11-01 LAB — SURGICAL PATHOLOGY

## 2019-11-01 NOTE — Anesthesia Postprocedure Evaluation (Signed)
Anesthesia Post Note  Patient: [name]  Procedure(s) Performed: COLONOSCOPY WITH PROPOFOL (N/A )  Patient location during evaluation: Endoscopy Anesthesia Type: General Level of consciousness: awake and alert Pain management: pain level controlled Vital Signs Assessment: post-procedure vital signs reviewed and stable Respiratory status: spontaneous breathing, nonlabored ventilation, respiratory function stable and patient connected to nasal cannula oxygen Cardiovascular status: blood pressure returned to baseline and stable Postop Assessment: no apparent nausea or vomiting Anesthetic complications: no     Last Vitals:  Vitals:   10/31/19 1206 10/31/19 1215  BP: 108/67 90/66  Pulse: 98   Resp: 15   SpO2: 98%     Last Pain:  Vitals:   11/01/19 0747  PainSc: 0-No pain                 Martha Clan

## 2019-11-06 ENCOUNTER — Ambulatory Visit: Payer: PPO | Admitting: Oncology

## 2019-11-06 ENCOUNTER — Other Ambulatory Visit: Payer: PPO

## 2019-11-14 ENCOUNTER — Other Ambulatory Visit
Admission: RE | Admit: 2019-11-14 | Discharge: 2019-11-14 | Disposition: A | Discharge status: Home / Self Care | Payer: PPO | Attending: Cardiovascular Disease | Admitting: Cardiovascular Disease

## 2019-11-14 DIAGNOSIS — I10 Essential (primary) hypertension: Secondary | ICD-10-CM | POA: Diagnosis not present

## 2019-11-14 DIAGNOSIS — N183 Chronic kidney disease, stage 3 unspecified: Secondary | ICD-10-CM | POA: Diagnosis not present

## 2019-11-14 LAB — BASIC METABOLIC PANEL
Anion gap: 9 (ref 5–15)
BUN: 28 mg/dL — ABNORMAL HIGH (ref 8–23)
CO2: 25 mmol/L (ref 22–32)
Calcium: 8.7 mg/dL — ABNORMAL LOW (ref 8.9–10.3)
Chloride: 103 mmol/L (ref 98–111)
Creatinine, Ser: 2.26 mg/dL — ABNORMAL HIGH (ref 0.61–1.24)
GFR calc Af Amer: 32 mL/min — ABNORMAL LOW (ref >60)
GFR calc non Af Amer: 27 mL/min — ABNORMAL LOW (ref >60)
Glucose, Bld: 192 mg/dL — ABNORMAL HIGH (ref 70–99)
Potassium: 4 mmol/L (ref 3.5–5.1)
Sodium: 137 mmol/L (ref 135–145)

## 2019-11-14 LAB — HEPATIC FUNCTION PANEL
ALT: 22 U/L (ref 0–44)
AST: 14 U/L — ABNORMAL LOW (ref 15–41)
Albumin: 3 g/dL — ABNORMAL LOW (ref 3.5–5.0)
Alkaline Phosphatase: 95 U/L (ref 38–126)
Bilirubin, Direct: 0.1 mg/dL (ref 0.0–0.2)
Indirect Bilirubin: 0.5 mg/dL (ref 0.3–0.9)
Total Bilirubin: 0.6 mg/dL (ref 0.3–1.2)
Total Protein: 6 g/dL — ABNORMAL LOW (ref 6.5–8.1)

## 2019-11-15 ENCOUNTER — Inpatient Hospital Stay: Payer: PPO

## 2019-11-15 ENCOUNTER — Inpatient Hospital Stay: Payer: PPO | Admitting: Oncology

## 2019-11-19 DIAGNOSIS — E1122 Type 2 diabetes mellitus with diabetic chronic kidney disease: Secondary | ICD-10-CM | POA: Diagnosis not present

## 2019-11-19 DIAGNOSIS — N1832 Chronic kidney disease, stage 3b: Secondary | ICD-10-CM | POA: Diagnosis not present

## 2019-11-19 DIAGNOSIS — I10 Essential (primary) hypertension: Secondary | ICD-10-CM | POA: Diagnosis not present

## 2019-11-19 DIAGNOSIS — R809 Proteinuria, unspecified: Secondary | ICD-10-CM | POA: Diagnosis not present

## 2019-11-20 ENCOUNTER — Telehealth: Payer: Self-pay

## 2019-11-20 NOTE — Telephone Encounter (Signed)
-----   Message from [name], MD sent at 11/15/2019  2:29 PM EST ----- Lab work Liver function stable Stable renal function

## 2019-11-20 NOTE — Telephone Encounter (Signed)
Patient calling States it will be best to call cell

## 2019-11-20 NOTE — Telephone Encounter (Signed)
Called to give the pt lab results.[email or web address]. 

## 2019-11-20 NOTE — Telephone Encounter (Signed)
Spoke with patient and reviewed results. He had no further questions at this time.

## 2019-11-26 ENCOUNTER — Other Ambulatory Visit: Payer: Self-pay | Admitting: Cardiovascular Disease

## 2019-11-29 DIAGNOSIS — N184 Chronic kidney disease, stage 4 (severe): Secondary | ICD-10-CM | POA: Diagnosis not present

## 2019-11-29 DIAGNOSIS — E785 Hyperlipidemia, unspecified: Secondary | ICD-10-CM | POA: Diagnosis not present

## 2019-11-29 DIAGNOSIS — E1122 Type 2 diabetes mellitus with diabetic chronic kidney disease: Secondary | ICD-10-CM | POA: Diagnosis not present

## 2019-11-29 DIAGNOSIS — Z794 Long term (current) use of insulin: Secondary | ICD-10-CM | POA: Diagnosis not present

## 2019-11-30 ENCOUNTER — Other Ambulatory Visit: Payer: Self-pay

## 2019-11-30 ENCOUNTER — Inpatient Hospital Stay: Payer: Medicare Other | Attending: Oncology

## 2019-11-30 ENCOUNTER — Encounter: Payer: Self-pay | Admitting: Oncology

## 2019-11-30 ENCOUNTER — Inpatient Hospital Stay (HOSPITAL_BASED_OUTPATIENT_CLINIC_OR_DEPARTMENT_OTHER): Payer: Medicare Other | Admitting: Oncology

## 2019-11-30 VITALS — BP 102/66 | HR 74 | Temp 97.1°F | Ht 67.0 in | Wt 165.0 lb

## 2019-11-30 DIAGNOSIS — E785 Hyperlipidemia, unspecified: Secondary | ICD-10-CM | POA: Diagnosis not present

## 2019-11-30 DIAGNOSIS — J449 Chronic obstructive pulmonary disease, unspecified: Secondary | ICD-10-CM | POA: Diagnosis not present

## 2019-11-30 DIAGNOSIS — C7A019 Malignant carcinoid tumor of the small intestine, unspecified portion: Secondary | ICD-10-CM

## 2019-11-30 DIAGNOSIS — D3A098 Benign carcinoid tumors of other sites: Secondary | ICD-10-CM

## 2019-11-30 DIAGNOSIS — I2510 Atherosclerotic heart disease of native coronary artery without angina pectoris: Secondary | ICD-10-CM | POA: Insufficient documentation

## 2019-11-30 DIAGNOSIS — N183 Chronic kidney disease, stage 3 unspecified: Secondary | ICD-10-CM | POA: Diagnosis not present

## 2019-11-30 DIAGNOSIS — R5381 Other malaise: Secondary | ICD-10-CM | POA: Insufficient documentation

## 2019-11-30 DIAGNOSIS — E1122 Type 2 diabetes mellitus with diabetic chronic kidney disease: Secondary | ICD-10-CM | POA: Insufficient documentation

## 2019-11-30 DIAGNOSIS — Z7901 Long term (current) use of anticoagulants: Secondary | ICD-10-CM | POA: Insufficient documentation

## 2019-11-30 DIAGNOSIS — R5382 Chronic fatigue, unspecified: Secondary | ICD-10-CM | POA: Insufficient documentation

## 2019-11-30 DIAGNOSIS — D696 Thrombocytopenia, unspecified: Secondary | ICD-10-CM | POA: Diagnosis not present

## 2019-11-30 DIAGNOSIS — I130 Hypertensive heart and chronic kidney disease with heart failure and stage 1 through stage 4 chronic kidney disease, or unspecified chronic kidney disease: Secondary | ICD-10-CM | POA: Insufficient documentation

## 2019-11-30 DIAGNOSIS — Z79899 Other long term (current) drug therapy: Secondary | ICD-10-CM | POA: Diagnosis not present

## 2019-11-30 DIAGNOSIS — R634 Abnormal weight loss: Secondary | ICD-10-CM | POA: Insufficient documentation

## 2019-11-30 DIAGNOSIS — F17210 Nicotine dependence, cigarettes, uncomplicated: Secondary | ICD-10-CM | POA: Diagnosis not present

## 2019-11-30 DIAGNOSIS — C7B04 Secondary carcinoid tumors of peritoneum: Secondary | ICD-10-CM | POA: Diagnosis not present

## 2019-11-30 DIAGNOSIS — I5032 Chronic diastolic (congestive) heart failure: Secondary | ICD-10-CM | POA: Diagnosis not present

## 2019-11-30 DIAGNOSIS — Z794 Long term (current) use of insulin: Secondary | ICD-10-CM | POA: Diagnosis not present

## 2019-11-30 LAB — CBC WITH DIFFERENTIAL/PLATELET
Abs Immature Granulocytes: 0.3 10*3/uL — ABNORMAL HIGH (ref 0.00–0.07)
Basophils Absolute: 0.1 10*3/uL (ref 0.0–0.1)
Basophils Relative: 1 %
Eosinophils Absolute: 0.2 10*3/uL (ref 0.0–0.5)
Eosinophils Relative: 2 %
HCT: 45.3 % (ref 39.0–52.0)
Hemoglobin: 13.8 g/dL (ref 13.0–17.0)
Immature Granulocytes: 4 %
Lymphocytes Relative: 12 %
Lymphs Abs: 0.8 10*3/uL (ref 0.7–4.0)
MCH: 29.5 pg (ref 26.0–34.0)
MCHC: 30.5 g/dL (ref 30.0–36.0)
MCV: 96.8 fL (ref 80.0–100.0)
Monocytes Absolute: 0.8 10*3/uL (ref 0.1–1.0)
Monocytes Relative: 11 %
Neutro Abs: 5 10*3/uL (ref 1.7–7.7)
Neutrophils Relative %: 70 %
Platelets: 122 10*3/uL — ABNORMAL LOW (ref 150–400)
RBC: 4.68 MIL/uL (ref 4.22–5.81)
RDW: 13 % (ref 11.5–15.5)
WBC: 7.2 10*3/uL (ref 4.0–10.5)
nRBC: 0 % (ref 0.0–0.2)

## 2019-11-30 LAB — COMPREHENSIVE METABOLIC PANEL
ALT: 20 U/L (ref 0–44)
AST: 19 U/L (ref 15–41)
Albumin: 3.3 g/dL — ABNORMAL LOW (ref 3.5–5.0)
Alkaline Phosphatase: 87 U/L (ref 38–126)
Anion gap: 8 (ref 5–15)
BUN: 30 mg/dL — ABNORMAL HIGH (ref 8–23)
CO2: 24 mmol/L (ref 22–32)
Calcium: 8.5 mg/dL — ABNORMAL LOW (ref 8.9–10.3)
Chloride: 106 mmol/L (ref 98–111)
Creatinine, Ser: 2.19 mg/dL — ABNORMAL HIGH (ref 0.61–1.24)
GFR calc Af Amer: 33 mL/min — ABNORMAL LOW (ref >60)
GFR calc non Af Amer: 28 mL/min — ABNORMAL LOW (ref >60)
Glucose, Bld: 188 mg/dL — ABNORMAL HIGH (ref 70–99)
Potassium: 4.4 mmol/L (ref 3.5–5.1)
Sodium: 138 mmol/L (ref 135–145)
Total Bilirubin: 0.6 mg/dL (ref 0.3–1.2)
Total Protein: 5.9 g/dL — ABNORMAL LOW (ref 6.5–8.1)

## 2019-11-30 NOTE — Progress Notes (Signed)
Patient stated that he had been doing well with no complaints. 

## 2019-12-03 ENCOUNTER — Other Ambulatory Visit: Payer: Self-pay | Admitting: Cardiology

## 2019-12-03 ENCOUNTER — Ambulatory Visit
Admission: RE | Admit: 2019-12-03 | Discharge: 2019-12-03 | Disposition: A | Discharge status: Home / Self Care | Payer: Medicare Other | Source: Ambulatory Visit | Attending: Cardiology | Admitting: Cardiology

## 2019-12-03 ENCOUNTER — Other Ambulatory Visit: Payer: Self-pay

## 2019-12-03 DIAGNOSIS — K869 Disease of pancreas, unspecified: Secondary | ICD-10-CM | POA: Diagnosis not present

## 2019-12-03 DIAGNOSIS — J439 Emphysema, unspecified: Secondary | ICD-10-CM | POA: Diagnosis not present

## 2019-12-03 DIAGNOSIS — R634 Abnormal weight loss: Secondary | ICD-10-CM | POA: Diagnosis not present

## 2019-12-03 DIAGNOSIS — E1129 Type 2 diabetes mellitus with other diabetic kidney complication: Secondary | ICD-10-CM | POA: Diagnosis not present

## 2019-12-03 DIAGNOSIS — I4892 Unspecified atrial flutter: Secondary | ICD-10-CM | POA: Diagnosis not present

## 2019-12-03 MED ORDER — ROSUVASTATIN CALCIUM 40 MG PO TABS: 40.0000 mg | ORAL_TABLET | Freq: Every day | ORAL | 3 refills | Status: DC

## 2019-12-03 NOTE — Progress Notes (Signed)
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 Differential at that time was normal and no immature cells were noted. Review of peripheral blood smear showed mild thrombocytopenia. Blasts are not identified. Peripheral flow cytometry did not reveal any significant diagnostic immunophenotypic abnormality. EPO level was normal at 8.1.JAK2 testing was not completed because of insufficient sample. BCR abl testing done on 10/27/2016 did not reveal any evidence of 9:22 translocation suggestive of CML.  Patient was asked to f/u with PCP.   He underwent CT abdomen on 9/18for decreased kidney function and hematuria. CT showed:Abnormal partially calcified soft tissue density in the ileocolic mesentery could represent a carcinoid metastasis. Differential considerations include the sequelae of prior mesenteric adenitis/panniculitis. Given traversing and superior mesenteric artery branches on the prior exam from 2012, aneurysm is possible but felt less likely. Consider further evaluation with octreotide scintigraphy. 3. L5 vague sclerotic lesion is not readily apparent on the prior exam. This may represent a bone island. If the patient is  eventually diagnosed with a malignancy, osseous metastasis would be a concern. 4. Possible soft tissue fullness within the pancreatic body and tail with equivocal peripancreatic edema. Correlate with signs/symptoms of pancreatitis.  Dotatatescan in November 2018 showed:IMPRESSION: 1. 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 2. Single nodal metastasis at the root of the mesenteric vessels. 3. Focal activity within the the small bowel of the lower abdomen midline without CT correlation. This could represent a primary neuroendocrine tumor neoplasm. Consider CT enterography for further evaluation. 4. No abnormal radiotracer accumulation within the pancreas.   MRI abdomen and pelvis with and without contrast in January 2019 showed:IMPRESSION: 1.8 x 2.0 cm enhancing mass in the right mid abdominal mesentery, hypermetabolic on dotatate PET, consistent with well-differentiated neuroendocrine tumor metastasis.  Additional 9 mm nodal metastasis in the central abdominal mesentery.  2.2 x 1.7 cm mass in the pancreatic head, without hypermetabolism on prior dotatate PET. Associated segmental atrophy the pancreatic body. Dilatation of the main pancreatic duct in the pancreatic tail. These findings are worrisome for pancreatic neoplasm, but are less suggestive of pancreatic adenocarcinoma or well-differentiated neuroendocrine tumor. EUS is suggested. 2.3 cm left adrenal adenoma, benign.  This was followed by EUS and FNA of the pancreatic head mass which showed:DIAGNOSIS:  A. PANCREATIC HEAD MASS; ENDOSCOPIC ULTRASOUND GUIDED FNA:  - POSITIVE FOR MALIGNANCY.  - NON-DUCTAL NEOPLASM PRESENT, SEE COMMENT.Based on cytologic features differential diagnosis includes neuroendocrine tumor and asthenic cell carcinoma. Case sent to [facility] for second opinion who also favored the diagnosis of neuroendocrine tumor  noting that acinar cell carcinoma cannot be excluded   Interval history-reports chronic fatigue.  He continues to lose weight.  He has lost about 30 pounds in the last 2 years.  Denies any abdominal  pain  ECOG PS- 1 Pain scale- 0   Review of systems- Review of Systems  Constitutional: Positive for malaise/fatigue and weight loss. Negative for chills and fever.  HENT: Negative for congestion, ear discharge and nosebleeds.   Eyes: Negative for blurred vision.  Respiratory: Negative for cough, hemoptysis, sputum production, shortness of breath and wheezing.   Cardiovascular: Negative for chest pain, palpitations, orthopnea and claudication.  Gastrointestinal: Negative for abdominal pain, blood in stool, constipation, diarrhea, heartburn, melena, nausea and vomiting.  Genitourinary: Negative for dysuria, flank pain, frequency, hematuria and urgency.  Musculoskeletal: Negative for back pain, joint pain and myalgias.  Skin: Negative for rash.  Neurological: Negative for dizziness, tingling, focal weakness, seizures, weakness and headaches.  Endo/Heme/Allergies: Does not bruise/bleed easily.  Psychiatric/Behavioral: Negative for depression and suicidal ideas. The patient does not have insomnia.       Allergies  Allergen Reactions  . Levaquin [Levofloxacin In D5w] Swelling and Rash     Past Medical History:  Diagnosis Date  . Aortic atherosclerosis ([place])    As seen on 04/2019 CT  . Atrial flutter ([place])    a. s/p ablation 2017/2018 and DCCV 2015/2016/2019 b. 2017 echo EF 60-65%, no RWMA, mild MR,  . CAD (coronary artery disease)    3v CAD 04/2019 CT  . Carcinoid tumor    metastatic  . CKD (chronic kidney disease), stage III   . COPD (chronic obstructive pulmonary disease) ([facility])    PM oxygen  . Current occasional smoker    1-2 cigarettes / week  . Diabetes mellitus type 2, insulin dependent ([place])   . Heart failure with preserved ejection fraction ([place])    2017 echo EF 60-65%, no  RWMA, mild MR,    . Hyperlipidemia   . Hypertension   . Pancreatitis   . Polycythemia      Past Surgical History:  Procedure Laterality Date  . A-FLUTTER ABLATION N/A 10/14/2017   Procedure: A-FLUTTER ABLATION;  Surgeon: Constance Haw, MD;  Location: [place] [facility];  Service: Cardiovascular;  Laterality: N/A;  . CARDIOVERSION N/A 09/08/2017   Procedure: CARDIOVERSION;  Surgeon: [name], MD;  Location: [facility];  Service: Cardiovascular;  Laterality: N/A;  . CARDIOVERSION N/A 03/22/2018   Procedure: CARDIOVERSION;  Surgeon: [name], MD;  Location: [facility];  Service: Cardiovascular;  Laterality: N/A;  . COLONOSCOPY    . COLONOSCOPY WITH PROPOFOL N/A 10/31/2019   Procedure: COLONOSCOPY WITH PROPOFOL;  Surgeon: Lin Landsman, MD;  Location: [facility];  Service: Gastroenterology;  Laterality: N/A;  . ELECTROPHYSIOLOGIC STUDY N/A 10/27/2016   Procedure: A-Flutter Ablation;  Surgeon: Deboraha Sprang, MD;  Location: [place] [facility];  Service: Cardiovascular;  Laterality: N/A;  . EUS N/A 12/22/2017   Procedure: FULL UPPER ENDOSCOPIC ULTRASOUND (EUS) RADIAL;  Surgeon: Reita Cliche, MD;  Location: [facility] ENDOSCOPY;  Service: Gastroenterology;  Laterality: N/A;  . TEE WITH CARDIOVERSION  12/16    Social History   Socioeconomic History  . Marital status: Widowed    Spouse name: Not on file  . Number of children: Not on file  . Years of education: Not on file  . Highest education level: Not on file  Occupational History  . Not on file  Tobacco Use  . Smoking status: Light Tobacco Smoker    Years: 45.00    Types: Cigarettes    Last attempt to quit: 11/27/2012    Years since quitting: 7.0  . Smokeless tobacco: Never Used  Substance  and Sexual Activity  . Alcohol use: Yes    Alcohol/week: 2.0 standard drinks    Types: 2 Cans of beer per week    Comment: beer every once a while once a monthnone last 24hrs  . Drug use: No  . Sexual activity: Not  on file  Other Topics Concern  . Not on file  Social History Narrative  . Not on file   Social Determinants of Health   Financial Resource Strain:   . Difficulty of Paying Living Expenses: Not on file  Food Insecurity:   . Worried About Charity fundraiser in the Last Year: Not on file  . Ran Out of Food in the Last Year: Not on file  Transportation Needs:   . Lack of Transportation (Medical): Not on file  . Lack of Transportation (Non-Medical): Not on file  Physical Activity:   . Days of Exercise per Week: Not on file  . Minutes of Exercise per Session: Not on file  Stress:   . Feeling of Stress : Not on file  Social Connections:   . Frequency of Communication with Friends and Family: Not on file  . Frequency of Social Gatherings with Friends and Family: Not on file  . Attends Religious Services: Not on file  . Active Member of Clubs or Organizations: Not on file  . Attends Archivist Meetings: Not on file  . Marital Status: Not on file  Intimate Partner Violence:   . Fear of Current or Ex-Partner: Not on file  . Emotionally Abused: Not on file  . Physically Abused: Not on file  . Sexually Abused: Not on file    Family History  Problem Relation Age of Onset  . Hypertension Father      Current Outpatient Medications:  .  acetaminophen (TYLENOL) 500 MG tablet, Take 500-1,000 mg by mouth every 6 (six) hours as needed for moderate pain or headache. , Disp: , Rfl:  .  albuterol (PROAIR HFA) 108 (90 Base) MCG/ACT inhaler, Inhale 2 puffs into the lungs every 4 (four) hours as needed for wheezing or shortness of breath. , Disp: , Rfl:  .  budesonide-formoterol (SYMBICORT) 80-4.5 MCG/ACT inhaler, Inhale 2 puffs into the lungs 2 (two) times daily. Takes at noon and at 4pm., Disp: , Rfl:  .  diltiazem (CARTIA XT) 120 MG 24 hr capsule, Take 1 capsule (120 mg total) by mouth 2 (two) times daily., Disp: 180 capsule, Rfl: 1 .  ELIQUIS 5 MG TABS tablet, Take 1 tablet by mouth  twice daily, Disp: 180 tablet, Rfl: 1 .  HYDROcodone-acetaminophen (NORCO/VICODIN) 5-325 MG tablet, Take 1-2 tablets by mouth every 4 (four) hours as needed for moderate pain., Disp: 30 tablet, Rfl: 0 .  Insulin Glargine (LANTUS SOLOSTAR) 100 UNIT/ML Solostar Pen, Inject 16 Units into the skin at bedtime., Disp: , Rfl:  .  levothyroxine (SYNTHROID, LEVOTHROID) 50 MCG tablet, Take 50 mcg by mouth daily., Disp: , Rfl:  .  losartan (COZAAR) 25 MG tablet, Take 1 tablet (25 mg total) by mouth daily., Disp: 90 tablet, Rfl: 3 .  metoprolol succinate (TOPROL-XL) 50 MG 24 hr tablet, TAKE 1 TABLET BY MOUTH IN THE EVENING. TAKE WITH OR IMMEDIATELY FOLLOWING A MEAL, Disp: 90 tablet, Rfl: 3 .  omeprazole (PRILOSEC) 20 MG capsule, Take 20 mg by mouth daily., Disp: , Rfl:  .  rosuvastatin (CRESTOR) 40 MG tablet, Take 1 tablet by mouth once daily, Disp: 90 tablet, Rfl: 0 .  vitamin B-12 (CYANOCOBALAMIN)  500 MCG tablet, Take 1,000 mcg by mouth daily. , Disp: , Rfl:   Physical exam:  Vitals:   11/30/19 0903 11/30/19 0911  BP: (!) 152/124 102/66  Pulse:  74  Temp: (!) 97.1 F (36.2 C) (!) 97.1 F (36.2 C)  TempSrc: Tympanic Tympanic  Weight: 165 lb (74.8 kg) 165 lb (74.8 kg)  Height: 5\' 8"  (1.727 m) 5\' 7"  (1.702 m)   Physical Exam Constitutional:      General: He is not in acute distress. HENT:     Head: Normocephalic and atraumatic.  Eyes:     Pupils: Pupils are equal, round, and reactive to light.  Cardiovascular:     Rate and Rhythm: Normal rate and regular rhythm.     Heart sounds: Normal heart sounds.  Pulmonary:     Effort: Pulmonary effort is normal.     Breath sounds: Normal breath sounds.  Abdominal:     General: Bowel sounds are normal. There is no distension.     Palpations: Abdomen is soft.     Tenderness: There is no abdominal tenderness.  Musculoskeletal:     Cervical back: Normal range of motion.  Skin:    General: Skin is warm and dry.  Neurological:     Mental Status: He is  alert and oriented to person, place, and time.      CMP Latest Ref Rng & Units 11/30/2019  Glucose 70 - 99 mg/dL 188(H)  BUN 8 - 23 mg/dL 30(H)  Creatinine 0.61 - 1.24 mg/dL 2.19(H)  Sodium 135 - 145 mmol/L 138  Potassium 3.5 - 5.1 mmol/L 4.4  Chloride 98 - 111 mmol/L 106  CO2 22 - 32 mmol/L 24  Calcium 8.9 - 10.3 mg/dL 8.5(L)  Total Protein 6.5 - 8.1 g/dL 5.9(L)  Total Bilirubin 0.3 - 1.2 mg/dL 0.6  Alkaline Phos 38 - 126 U/L 87  AST 15 - 41 U/L 19  ALT 0 - 44 U/L 20   CBC Latest Ref Rng & Units 11/30/2019  WBC 4.0 - 10.5 K/uL 7.2  Hemoglobin 13.0 - 17.0 g/dL 13.8  Hematocrit 39.0 - 52.0 % 45.3  Platelets 150 - 400 K/uL 122(L)    No images are attached to the encounter.  DG Chest 2 View  Result Date: 12/03/2019 CLINICAL DATA:  Weight loss. EXAM: CHEST - 2 VIEW COMPARISON:  November 03, 2014 FINDINGS: The heart, hila, and mediastinum are normal. No pneumothorax. Mild hyperexpansion lungs, stable. No focal infiltrates, nodules, or masses. IMPRESSION: 1. Mild hyperexpansion of the lungs suggesting COPD or emphysema. No acute abnormalities are identified. Electronically Signed   By: Dorise Bullion III M.D   On: 12/03/2019 10:55     Assessment and plan- Patient is a 74 y.o. male with metastatic small bowel carcinoid here for routine follow-up  As noted previously patient has 3 main areas of carcinoid.  The primary tumor is in the small bowel mesentery with surrounding mesenteric lymph nodes which were PET avid on dotatate scan along with a 2.1 cm lesion in his pancreatic head.  Both of these areas of carcinoid in the mesentery and the adjacent lymph node have not significantly changed in size over the last 2 years.  There are persistent inflammatory changes noted with presence of pancreatitis along with the candidate lesion in the neck of the pancreas measuring up to 1.9 cm which is also not changed in size significantly.  Patient was evaluated by Dr. Mariah Milling from [facility] and given the  stability of the size  of the lesion plan was to continue watchful monitoring Without any surgical intervention at this time.  They do plan to get another repeat CT in June.  Again discussed with the patient that octreotide would likely not decrease the size of the mass and is mainly used in high-volume disease or if it gets symptomatic.  Patient denies any palpitations, diarrhea or abdominal pain.  He has not had any recurrent episodes of pancreatitis either.  I will therefore hold off on giving him octreotide at this time.  I will see him back in 6 months time with CBC with differential, CMP and chromogranin A levels   Visit Diagnosis 1. Carcinoid tumor determined by biopsy of pancreas   2. Malignant carcinoid tumor of small intestine, unspecified location [facility])      Dr. Randa Evens, MD, MPH [facility] [phone] 12/03/2019 12:17 PM

## 2019-12-10 DIAGNOSIS — I4892 Unspecified atrial flutter: Secondary | ICD-10-CM | POA: Diagnosis not present

## 2019-12-10 DIAGNOSIS — J439 Emphysema, unspecified: Secondary | ICD-10-CM | POA: Diagnosis not present

## 2019-12-10 DIAGNOSIS — K869 Disease of pancreas, unspecified: Secondary | ICD-10-CM | POA: Diagnosis not present

## 2019-12-10 DIAGNOSIS — E1129 Type 2 diabetes mellitus with other diabetic kidney complication: Secondary | ICD-10-CM | POA: Diagnosis not present

## 2019-12-17 ENCOUNTER — Other Ambulatory Visit: Payer: Self-pay | Admitting: Cardiovascular Disease

## 2019-12-17 NOTE — Telephone Encounter (Signed)
Refill Request.  

## 2020-01-17 ENCOUNTER — Other Ambulatory Visit: Payer: Self-pay | Admitting: Cardiovascular Disease

## 2020-01-17 NOTE — Telephone Encounter (Signed)
Prescription refill request for Eliquis received.  Last office visit: [name] 09/27/2019 Scr: 2.19, 11/30/2019 Age: 74 y.o. Weight: 74.8 kg  Prescription refill sent.

## 2020-01-17 NOTE — Telephone Encounter (Signed)
Please review for refill, Thanks !  

## 2020-02-07 DIAGNOSIS — Z720 Tobacco use: Secondary | ICD-10-CM | POA: Diagnosis not present

## 2020-02-07 DIAGNOSIS — E1122 Type 2 diabetes mellitus with diabetic chronic kidney disease: Secondary | ICD-10-CM | POA: Diagnosis not present

## 2020-02-07 DIAGNOSIS — I10 Essential (primary) hypertension: Secondary | ICD-10-CM | POA: Diagnosis not present

## 2020-02-07 DIAGNOSIS — E785 Hyperlipidemia, unspecified: Secondary | ICD-10-CM | POA: Diagnosis not present

## 2020-02-08 ENCOUNTER — Emergency Department: Payer: Medicare Other

## 2020-02-08 ENCOUNTER — Emergency Department
Admission: EM | Admit: 2020-02-08 | Discharge: 2020-02-09 | Disposition: A | Discharge status: Other Acute Inpatient Hospital | Payer: Medicare Other | Attending: Emergency Medicine | Admitting: Emergency Medicine

## 2020-02-08 ENCOUNTER — Encounter
Admission: EM | Disposition: A | Discharge status: Other Acute Inpatient Hospital | Payer: Self-pay | Source: Home / Self Care | Attending: Student

## 2020-02-08 ENCOUNTER — Other Ambulatory Visit: Payer: Self-pay

## 2020-02-08 DIAGNOSIS — E1122 Type 2 diabetes mellitus with diabetic chronic kidney disease: Secondary | ICD-10-CM | POA: Diagnosis not present

## 2020-02-08 DIAGNOSIS — Z20822 Contact with and (suspected) exposure to covid-19: Secondary | ICD-10-CM | POA: Diagnosis not present

## 2020-02-08 DIAGNOSIS — K838 Other specified diseases of biliary tract: Secondary | ICD-10-CM | POA: Diagnosis not present

## 2020-02-08 DIAGNOSIS — Z452 Encounter for adjustment and management of vascular access device: Secondary | ICD-10-CM | POA: Diagnosis not present

## 2020-02-08 DIAGNOSIS — R7401 Elevation of levels of liver transaminase levels: Secondary | ICD-10-CM | POA: Insufficient documentation

## 2020-02-08 DIAGNOSIS — J439 Emphysema, unspecified: Secondary | ICD-10-CM | POA: Diagnosis not present

## 2020-02-08 DIAGNOSIS — N1832 Chronic kidney disease, stage 3b: Secondary | ICD-10-CM | POA: Diagnosis not present

## 2020-02-08 DIAGNOSIS — N184 Chronic kidney disease, stage 4 (severe): Secondary | ICD-10-CM | POA: Diagnosis not present

## 2020-02-08 DIAGNOSIS — K7689 Other specified diseases of liver: Secondary | ICD-10-CM | POA: Diagnosis not present

## 2020-02-08 DIAGNOSIS — R109 Unspecified abdominal pain: Secondary | ICD-10-CM | POA: Insufficient documentation

## 2020-02-08 DIAGNOSIS — Z79899 Other long term (current) drug therapy: Secondary | ICD-10-CM | POA: Diagnosis not present

## 2020-02-08 DIAGNOSIS — R945 Abnormal results of liver function studies: Secondary | ICD-10-CM | POA: Diagnosis not present

## 2020-02-08 DIAGNOSIS — Z794 Long term (current) use of insulin: Secondary | ICD-10-CM | POA: Insufficient documentation

## 2020-02-08 DIAGNOSIS — R1110 Vomiting, unspecified: Secondary | ICD-10-CM | POA: Insufficient documentation

## 2020-02-08 DIAGNOSIS — K859 Acute pancreatitis without necrosis or infection, unspecified: Secondary | ICD-10-CM | POA: Diagnosis not present

## 2020-02-08 DIAGNOSIS — I959 Hypotension, unspecified: Secondary | ICD-10-CM | POA: Insufficient documentation

## 2020-02-08 DIAGNOSIS — K5730 Diverticulosis of large intestine without perforation or abscess without bleeding: Secondary | ICD-10-CM | POA: Diagnosis not present

## 2020-02-08 DIAGNOSIS — Z7901 Long term (current) use of anticoagulants: Secondary | ICD-10-CM | POA: Insufficient documentation

## 2020-02-08 DIAGNOSIS — I4892 Unspecified atrial flutter: Secondary | ICD-10-CM | POA: Diagnosis not present

## 2020-02-08 DIAGNOSIS — R17 Unspecified jaundice: Secondary | ICD-10-CM | POA: Insufficient documentation

## 2020-02-08 DIAGNOSIS — I129 Hypertensive chronic kidney disease with stage 1 through stage 4 chronic kidney disease, or unspecified chronic kidney disease: Secondary | ICD-10-CM | POA: Diagnosis not present

## 2020-02-08 DIAGNOSIS — N179 Acute kidney failure, unspecified: Secondary | ICD-10-CM | POA: Diagnosis not present

## 2020-02-08 DIAGNOSIS — E1165 Type 2 diabetes mellitus with hyperglycemia: Secondary | ICD-10-CM | POA: Diagnosis not present

## 2020-02-08 DIAGNOSIS — E1129 Type 2 diabetes mellitus with other diabetic kidney complication: Secondary | ICD-10-CM | POA: Diagnosis not present

## 2020-02-08 DIAGNOSIS — R7989 Other specified abnormal findings of blood chemistry: Secondary | ICD-10-CM

## 2020-02-08 DIAGNOSIS — F17210 Nicotine dependence, cigarettes, uncomplicated: Secondary | ICD-10-CM | POA: Diagnosis not present

## 2020-02-08 HISTORY — PX: TEMPORARY DIALYSIS CATHETER: CATH118312

## 2020-02-08 LAB — HEPATIC FUNCTION PANEL
ALT: 125 U/L — ABNORMAL HIGH (ref 0–44)
AST: 76 U/L — ABNORMAL HIGH (ref 15–41)
Albumin: 3.3 g/dL — ABNORMAL LOW (ref 3.5–5.0)
Alkaline Phosphatase: 730 U/L — ABNORMAL HIGH (ref 38–126)
Bilirubin, Direct: 5.6 mg/dL — ABNORMAL HIGH (ref 0.0–0.2)
Indirect Bilirubin: 3.3 mg/dL — ABNORMAL HIGH (ref 0.3–0.9)
Total Bilirubin: 8.9 mg/dL — ABNORMAL HIGH (ref 0.3–1.2)
Total Protein: 6.6 g/dL (ref 6.5–8.1)

## 2020-02-08 LAB — BASIC METABOLIC PANEL
Anion gap: 15 (ref 5–15)
BUN: 88 mg/dL — ABNORMAL HIGH (ref 8–23)
CO2: 19 mmol/L — ABNORMAL LOW (ref 22–32)
Calcium: 7.9 mg/dL — ABNORMAL LOW (ref 8.9–10.3)
Chloride: 99 mmol/L (ref 98–111)
Creatinine, Ser: 10.64 mg/dL — ABNORMAL HIGH (ref 0.61–1.24)
GFR calc Af Amer: 5 mL/min — ABNORMAL LOW (ref >60)
GFR calc non Af Amer: 4 mL/min — ABNORMAL LOW (ref >60)
Glucose, Bld: 269 mg/dL — ABNORMAL HIGH (ref 70–99)
Potassium: 4.6 mmol/L (ref 3.5–5.1)
Sodium: 133 mmol/L — ABNORMAL LOW (ref 135–145)

## 2020-02-08 LAB — CBC WITH DIFFERENTIAL/PLATELET
Abs Immature Granulocytes: 0.11 10*3/uL — ABNORMAL HIGH (ref 0.00–0.07)
Basophils Absolute: 0 10*3/uL (ref 0.0–0.1)
Basophils Relative: 1 %
Eosinophils Absolute: 0.1 10*3/uL (ref 0.0–0.5)
Eosinophils Relative: 1 %
HCT: 44.6 % (ref 39.0–52.0)
Hemoglobin: 14.8 g/dL (ref 13.0–17.0)
Immature Granulocytes: 2 %
Lymphocytes Relative: 7 %
Lymphs Abs: 0.5 10*3/uL — ABNORMAL LOW (ref 0.7–4.0)
MCH: 30.3 pg (ref 26.0–34.0)
MCHC: 33.2 g/dL (ref 30.0–36.0)
MCV: 91.2 fL (ref 80.0–100.0)
Monocytes Absolute: 1.1 10*3/uL — ABNORMAL HIGH (ref 0.1–1.0)
Monocytes Relative: 15 %
Neutro Abs: 5.5 10*3/uL (ref 1.7–7.7)
Neutrophils Relative %: 74 %
Platelets: 121 10*3/uL — ABNORMAL LOW (ref 150–400)
RBC: 4.89 MIL/uL (ref 4.22–5.81)
RDW: 13.7 % (ref 11.5–15.5)
WBC: 7.3 10*3/uL (ref 4.0–10.5)
nRBC: 0 % (ref 0.0–0.2)

## 2020-02-08 LAB — LIPASE, BLOOD: Lipase: 257 U/L — ABNORMAL HIGH (ref 11–51)

## 2020-02-08 LAB — LACTIC ACID, PLASMA: Lactic Acid, Venous: 0.7 mmol/L (ref 0.5–1.9)

## 2020-02-08 LAB — PHOSPHORUS: Phosphorus: 6.9 mg/dL — ABNORMAL HIGH (ref 2.5–4.6)

## 2020-02-08 LAB — PROTIME-INR
INR: 1.6 — ABNORMAL HIGH (ref 0.8–1.2)
Prothrombin Time: 18.9 seconds — ABNORMAL HIGH (ref 11.4–15.2)

## 2020-02-08 LAB — MAGNESIUM: Magnesium: 2.3 mg/dL (ref 1.7–2.4)

## 2020-02-08 LAB — POC SARS CORONAVIRUS 2 AG: SARS Coronavirus 2 Ag: NEGATIVE

## 2020-02-08 SURGERY — TEMPORARY DIALYSIS CATHETER
Anesthesia: Moderate Sedation

## 2020-02-08 MED ORDER — SODIUM CHLORIDE 0.9 % IV BOLUS
1000.0000 mL | Freq: Once | INTRAVENOUS | Status: AC
  Administered 2020-02-08: 1000 mL via INTRAVENOUS

## 2020-02-08 MED ORDER — SODIUM CHLORIDE 0.9 % IV SOLN: Freq: Once | INTRAVENOUS | Status: AC

## 2020-02-08 MED ORDER — PIPERACILLIN-TAZOBACTAM 3.375 G IVPB 30 MIN: 3.3750 g | Freq: Three times a day (TID) | INTRAVENOUS | Status: DC

## 2020-02-08 MED ORDER — CHLORHEXIDINE GLUCONATE CLOTH 2 % EX PADS
6.0000 | MEDICATED_PAD | Freq: Every day | CUTANEOUS | Status: DC
  Filled 2020-02-08 (×2): qty 6

## 2020-02-08 MED ORDER — PIPERACILLIN-TAZOBACTAM 3.375 G IVPB
3.3750 g | Freq: Two times a day (BID) | INTRAVENOUS | Status: DC
  Administered 2020-02-09 (×2): 3.375 g via INTRAVENOUS
  Filled 2020-02-08 (×2): qty 50

## 2020-02-08 MED ORDER — PIPERACILLIN-TAZOBACTAM 3.375 G IVPB
3.3750 g | Freq: Once | INTRAVENOUS | Status: AC
  Administered 2020-02-08: 3.375 g via INTRAVENOUS
  Filled 2020-02-08: qty 50

## 2020-02-08 SURGICAL SUPPLY — 2 items
COVER PROBE U/S 5X48 (MISCELLANEOUS) ×3 IMPLANT
KIT CATH DIALYSIS TRI 20X13 (CATHETERS) ×3 IMPLANT

## 2020-02-08 NOTE — Consult Note (Signed)
[facility] Vascular Consult Note  MRN : [id number]  [name] is a 74 y.o. (03-23-1946) male who presents with chief complaint of  Chief Complaint  Patient presents with  . Hypotension   History of Present Illness:  [name] is a 74 y.o. male with a history of atrial flutter on anticoagulation, CAD, metastatic small bowel carcinoid tumor, CKD, COPD, DM, pancreatitis who presents to the emergency department for for low blood pressure, intermittent abdominal pain, vomiting.  Patient states he has had intermittent abdominal pain over the last 4 to 5 days.  Located to the lower abdomen, primarily right-sided, aching.  Comes and goes intermittently, does seem to be worsened by eating.  No apparent alleviating factors.  He denies any pain at present, 0/10.  Has had a few episodes of nonbloody, nonbilious emesis this week as well, no more than 2 or 3. Otherwise reports adequate PO intake. Reports constipation over this time period, tried taking MiraLax but threw it up. No diarrhea. Was initially at his PCP office this AM, when he was noted to be hypotensive and therefore referred to the emergency department.  He denies any lightheadedness, dizziness, or weakness.  No fevers.  Denies any hematuria or dysuria.  No sick contacts.  During his work-up in the emergency department, there is some concern for worsening of his carcinoid disease (known to be located in the pancreatic head) w/ obstructive effect vs gallstone pancreatitis. Unable to perform ERCP at [facility] so patient will be transferred to [facility]. Anticipate patient will likely board in the ED while awaiting transfer given [facility] is at capacity at present.  Patient also noted to have a creatinine of 10.64 / BUN 88 (previous on 11/30/19 2.19 / 30).  The patient has been seen by nephrology and will need to have to undergo urgent dialysis today and does not have an adequate dialysis access to allow for this.   Vascular surgery was consulted by Dr. Kerman Passey for insertion of a temporary dialysis catheter while patient awaits transfer to [facility].    Current Facility-Administered Medications  Medication Dose Route Frequency Provider Last Rate Last Admin  . [MAR Hold] piperacillin-tazobactam (ZOSYN) IVPB 3.375 g  3.375 g Intravenous Q12H Lilia Pro., MD      . Doug Sou Hold] piperacillin-tazobactam (ZOSYN) IVPB 3.375 g  3.375 g Intravenous Once Lilia Pro., MD       Past Medical History:  Diagnosis Date  . Aortic atherosclerosis ([place])    As seen on 04/2019 CT  . Atrial flutter ([place])    a. s/p ablation 2017/2018 and DCCV 2015/2016/2019 b. 2017 echo EF 60-65%, no RWMA, mild MR,  . CAD (coronary artery disease)    3v CAD 04/2019 CT  . Carcinoid tumor    metastatic  . CKD (chronic kidney disease), stage III   . COPD (chronic obstructive pulmonary disease) ([facility])    PM oxygen  . Current occasional smoker    1-2 cigarettes / week  . Diabetes mellitus type 2, insulin dependent ([place])   . Heart failure with preserved ejection fraction ([place])    2017 echo EF 60-65%, no RWMA, mild MR,    . Hyperlipidemia   . Hypertension   . Pancreatitis   . Polycythemia    Past Surgical History:  Procedure Laterality Date  . A-FLUTTER ABLATION N/A 10/14/2017   Procedure: A-FLUTTER ABLATION;  Surgeon: Constance Haw, MD;  Location: [facility];  Service: Cardiovascular;  Laterality: N/A;  .  CARDIOVERSION N/A 09/08/2017   Procedure: CARDIOVERSION;  Surgeon: [name], MD;  Location: [facility] ORS;  Service: Cardiovascular;  Laterality: N/A;  . CARDIOVERSION N/A 03/22/2018   Procedure: CARDIOVERSION;  Surgeon: [name], MD;  Location: [facility] ORS;  Service: Cardiovascular;  Laterality: N/A;  . COLONOSCOPY    . COLONOSCOPY WITH PROPOFOL N/A 10/31/2019   Procedure: COLONOSCOPY WITH PROPOFOL;  Surgeon: Lin Landsman, MD;  Location: [facility] ENDOSCOPY;  Service: Gastroenterology;  Laterality: N/A;   . ELECTROPHYSIOLOGIC STUDY N/A 10/27/2016   Procedure: A-Flutter Ablation;  Surgeon: Deboraha Sprang, MD;  Location: [place] [facility];  Service: Cardiovascular;  Laterality: N/A;  . EUS N/A 12/22/2017   Procedure: FULL UPPER ENDOSCOPIC ULTRASOUND (EUS) RADIAL;  Surgeon: Reita Cliche, MD;  Location: [facility] ENDOSCOPY;  Service: Gastroenterology;  Laterality: N/A;  . TEE WITH CARDIOVERSION  12/16   Social History Social History   Tobacco Use  . Smoking status: Light Tobacco Smoker    Years: 45.00    Types: Cigarettes    Last attempt to quit: 11/27/2012    Years since quitting: 7.2  . Smokeless tobacco: Never Used  Substance Use Topics  . Alcohol use: Yes    Alcohol/week: 2.0 standard drinks    Types: 2 Cans of beer per week    Comment: beer every once a while once a monthnone last 24hrs  . Drug use: No   Family History Family History  Problem Relation Age of Onset  . Hypertension Father   Patient denies any family history of peripheral artery disease, venous disease or renal disease.  Allergies  Allergen Reactions  . Levaquin [Levofloxacin In D5w] Swelling and Rash   REVIEW OF SYSTEMS (Negative unless checked)  Constitutional: [] Weight loss  [] Fever  [] Chills Cardiac: [] Chest pain   [] Chest pressure   [] Palpitations   [] Shortness of breath when laying flat   [] Shortness of breath at rest   [] Shortness of breath with exertion. Vascular:  [] Pain in legs with walking   [] Pain in legs at rest   [] Pain in legs when laying flat   [] Claudication   [] Pain in feet when walking  [] Pain in feet at rest  [] Pain in feet when laying flat   [] History of DVT   [] Phlebitis   [] Swelling in legs   [] Varicose veins   [] Non-healing ulcers Pulmonary:   [] Uses home oxygen   [] Productive cough   [] Hemoptysis   [] Wheeze  [] COPD   [] Asthma Neurologic:  [] Dizziness  [] Blackouts   [] Seizures   [] History of stroke   [] History of TIA  [] Aphasia   [] Temporary blindness   [] Dysphagia   [] Weakness or numbness  in arms   [] Weakness or numbness in legs Musculoskeletal:  [] Arthritis   [] Joint swelling   [] Joint pain   [] Low back pain Hematologic:  [] Easy bruising  [] Easy bleeding   [] Hypercoagulable state   [] Anemic  [] Hepatitis Gastrointestinal:  [] Blood in stool   [] Vomiting blood  [] Gastroesophageal reflux/heartburn   [] Difficulty swallowing. Genitourinary:  [x] Chronic kidney disease   [] Difficult urination  [] Frequent urination  [] Burning with urination   [] Blood in urine Skin:  [] Rashes   [] Ulcers   [] Wounds Psychological:  [] History of anxiety   []  History of major depression.  Physical Examination  Vitals:   02/08/20 1613 02/08/20 1614 02/08/20 1614 02/08/20 1626  BP: (!) 91/52   (!) 96/56  Pulse:    78  Resp:  19  16  Temp:    (!) 97.5 F (36.4 C)  TempSrc:    Oral  SpO2:   97% 97%  Weight:    72.1 kg  Height:    5\' 7"  (1.702 m)   Body mass index is 24.9 kg/m. Gen: WD/WN Head: [place]/AT, No temporalis wasting Ear/Nose/Throat: Hearing grossly intact, nares w/o erythema or drainage Eyes: Sclera non-icteric, conjunctiva clear Neck: Supple, no nuchal rigidity.  No JVD.  Pulmonary:  Good air movement, clear to auscultation bilaterally.  Cardiac: RRR, normal S1, S2, no Murmurs, rubs or gallops. Vascular: Warm distally to toes Gastrointestinal: soft, non-tender/non-distended. No guarding/reflex.  Musculoskeletal: M/S 5/5 throughout.  Extremities without ischemic changes.  No deformity or atrophy. Mild edema in the lower extremities bilaterally Neurologic: Intact, no deficiencies noted. Psychiatric: Appropriate Dermatologic: No rashes or ulcers noted.   Lymph : No Cervical, Axillary, or Inguinal lymphadenopathy.  CBC Lab Results  Component Value Date   WBC 7.3 02/08/2020   HGB 14.8 02/08/2020   HCT 44.6 02/08/2020   MCV 91.2 02/08/2020   PLT 121 (L) 02/08/2020   BMET    Component Value Date/Time   NA 133 (L) 02/08/2020 1015   NA 140 10/11/2017 0932   NA 138 11/01/2014 0743    K 4.6 02/08/2020 1015   K 3.8 11/01/2014 0743   CL 99 02/08/2020 1015   CL 103 11/01/2014 0743   CO2 19 (L) 02/08/2020 1015   CO2 27 11/01/2014 0743   GLUCOSE 269 (H) 02/08/2020 1015   GLUCOSE 125 (H) 11/01/2014 0743   BUN 88 (H) 02/08/2020 1015   BUN 33 (H) 10/11/2017 0932   BUN 11 11/01/2014 0743   CREATININE 10.64 (H) 02/08/2020 1015   CREATININE 1.15 11/05/2014 0453   CALCIUM 7.9 (L) 02/08/2020 1015   CALCIUM 9.4 11/01/2014 0743   GFRNONAA 4 (L) 02/08/2020 1015   GFRNONAA >60 11/05/2014 0453   GFRAA 5 (L) 02/08/2020 1015   GFRAA >60 11/05/2014 0453   Estimated Creatinine Clearance: 5.6 mL/min (A) (by C-G formula based on SCr of 10.64 mg/dL (H)).  COAG Lab Results  Component Value Date   INR 1.6 (H) 02/08/2020   INR 1.10 03/15/2018   INR 1.1 09/06/2017   Radiology CT Abdomen Pelvis Wo Contrast  Result Date: 02/08/2020 CLINICAL DATA:  74 year old male with history of abdominal pain and vomiting. EXAM: CT ABDOMEN AND PELVIS WITHOUT CONTRAST TECHNIQUE: Multidetector CT imaging of the abdomen and pelvis was performed following the standard protocol without IV contrast. COMPARISON:  CT the abdomen and pelvis 04/24/2019. FINDINGS: Lower chest: Aortic atherosclerosis. Calcified atherosclerotic plaque in the left circumflex and right coronary arteries. Hepatobiliary: No definite suspicious cystic or solid hepatic lesions are confidently identified on today's noncontrast CT examination. Amorphous intermediate attenuation material lying dependently in the gallbladder compatible with biliary sludge, as well as a tiny 3 mm calcified gallstone. No inflammatory changes surrounding the gallbladder to suggest an acute cholecystitis at this time. Pancreas: There are again inflammatory changes surrounding the pancreas, indicative of pancreatitis. Some low-attenuation areas are now noted in and adjacent to the pancreas, including a 2.2 x 1.6 cm area in the body of the pancreas (axial image 24 of  series 2), and a larger 4.0 x 3.0 x 3.7 cm area cephalad to the body and tail of the pancreas intimately associated with the undersurface of the lesser curvature of the proximal stomach (axial image 19 of series 2 and coronal image 36 of series 5), presumably developing pancreatic pseudocysts. Spleen: Unremarkable. Adrenals/Urinary Tract: Unenhanced appearance of the kidneys and right adrenal gland  is normal. 2.5 x 2.4 cm low-attenuation (0 HU) left adrenal nodule which is partially calcified, stable compared to the prior examination, compatible with an adenoma. No hydroureteronephrosis. Urinary bladder is normal in appearance. Stomach/Bowel: Normal appearance of the stomach. No pathologic dilatation of small bowel or colon. A few scattered colonic diverticulae are noted, particularly in the sigmoid colon, without surrounding inflammatory changes to suggest an acute diverticulitis at this time. The appendix is not confidently identified and may be surgically absent. Regardless, there are no inflammatory changes noted adjacent to the cecum to suggest the presence of an acute appendicitis at this time. Vascular/Lymphatic: Aortic atherosclerosis. Partially calcified nodal mass in the central small bowel mesentery (axial image 47 of series 2) measuring 3.2 x 1.1 cm, grossly similar to prior studies allowing for slight difference in patient positioning. 11 mm short axis mesenteric lymph node adjacent to the superior mesenteric artery (axial image 35 of series 2), increased from 9 mm on the prior examination. Reproductive: Prostate gland and seminal vesicles are unremarkable in appearance. Other: No significant volume of ascites.  No pneumoperitoneum. Musculoskeletal: There are no aggressive appearing lytic or blastic lesions noted in the visualized portions of the skeleton. IMPRESSION: 1. Persistent evidence of acute pancreatitis with two pancreatic and peripancreatic fluid collections, which are presumably evolving  pancreatic pseudocysts, as detailed above. 2. Peripancreatic lymph node in the proximal small bowel mesentery adjacent to the superior mesenteric artery measuring 11 mm, increased in size compared to the prior study. Whether this is reactive or indicative of metastatic disease from suspected pancreatic head mass (which is not confidently identified on today's noncontrast CT examination) is uncertain. Other previously noted partially calcified nodal mass more caudally located in the small bowel mesentery is roughly stable in size allowing for differences in position of the lesion (previously hypermetabolic on Dotatate PET scan, indicative of a neuroendocrine tumor). 3. Small volume of biliary sludge and tiny calcified gallstone lying dependently in the gallbladder. No findings to suggest an acute cholecystitis at this time. 4. Aortic atherosclerosis, in addition to at least 2 vessel coronary artery disease. Assessment for potential risk factor modification, dietary therapy or pharmacologic therapy may be warranted, if clinically indicated. 5. Colonic diverticulosis without evidence of acute diverticulitis at this time. Electronically Signed   By: Vinnie Langton M.D.   On: 02/08/2020 12:06   US ABDOMEN LIMITED RUQ  Result Date: 02/08/2020 CLINICAL DATA:  Elevated bilirubin EXAM: ULTRASOUND ABDOMEN LIMITED RIGHT UPPER QUADRANT COMPARISON:  None Correlation: CT abdomen pelvis 02/08/2020 FINDINGS: Gallbladder: 5 mm intraluminal echogenic focus, not definitely mobile; this could represent a small polyp or could represent a small adherent gallstone. Patient had a 3 mm gallstone on CT. No gallbladder wall thickening, pericholecystic fluid or sonographic Murphy sign. Common bile duct: Diameter: 12 mm, dilated Liver: Normal echogenicity. Hepatic contour appears subtly nodular/irregular cannot exclude cirrhosis, though not well demonstrated on CT. No hepatic masses. Minimal intrahepatic biliary dilatation. Portal vein is  patent on color Doppler imaging with normal direction of blood flow towards the liver. Other: No RIGHT upper quadrant free fluid. IMPRESSION: Dilated CBD with minimal intrahepatic biliary dilatation, recommend correlation with LFTs. Questionable 5 mm gallstone versus gallbladder polyp. Hepatic contours appear subtly nodular, cannot exclude cirrhosis. Electronically Signed   By: Lavonia Dana M.D.   On: 02/08/2020 12:28   Assessment/Plan The patient is a 74 year old male with multiple medical issues including possible worsening of his carcinoid disease.  Patient found to be in acute renal failure during work-up  in the emergency department.    1.  Acute on chronic kidney disease: At this time, the patient has been seen by nephrology who would like to initiate emergent dialysis.  The patient does not have an adequate dialysis access at this time to allow for this.  We will proceed with temporary dialysis catheter placement. Risks and benefits discussed with the patient.  All questions answered.  The patient wishes to proceed.   2. Type 2 diabetes: On appropriate medications. Encouraged good control as its slows the progression of atherosclerotic disease.  3.  Hyperlipidemia: On Eliquis and statin for medical management. Encouraged good control as its slows the progression of atherosclerotic disease.  Discussed with Dr. Mayme Genta, PA-C  02/08/2020 4:33 PM

## 2020-02-08 NOTE — Progress Notes (Signed)
[name], male   DOB: August 11, 1946, 74 y.o.   MRN: [id number]  Patient's case was discussed with Dr. Joan Mayans ER physician and also Dr. Kerman Passey ER physician's.  Unfortunately I cannot admit the patient here to this hospital because we do not have a specialist that can do ERCPs available currently. I recommended transfer as soon as possible.  In reviewing CT scans patient does have pancreatitis and likely pseudocyst formation which would require tertiary care center anyway.  The patient will stay in the emergency room until transfer to [facility] tomorrow.  Dr. Kerman Passey spoke with [facility] and they will hopefully taken transfer tomorrow.  I advised to give IV fluids and empiric Zosyn to cover for possible cholangitis.  With the patient's creatinine of over 10, I suggested nephrology consultation, and looks like patient will be set up for dialysis.  Dr. Kerman Passey was okay with holding off on medical consult at this point, since patient will be going to a tertiary care center tomorrow.  Dr Loletha Grayer

## 2020-02-08 NOTE — ED Notes (Signed)
Pt back to room. Temp R chest/neck access site clean with minimal amount of blood noted at site. Pt denies pain. Bed locked low. Rail up. Call bell within reach.

## 2020-02-08 NOTE — ED Notes (Signed)
[facility] NT to transport pt to Dialysis soon. Pt updated.

## 2020-02-08 NOTE — Consult Note (Signed)
[address] [place], [place] [address] Phone [phone]. Fax [phone]  Date: 02/08/2020                  Patient Name:  [name]  MRN: [id number]  DOB: 24-Jan-1946  Age / Sex: 74 y.o., male         PCP: [name], MD                 Service Requesting Consult: ER/ Lilia Pro., MD                 Reason for Consult: ARF            History of Present Illness: Patient is a 74 y.o. male with multiple medical problems, who was admitted to [facility] on 02/08/2020 for evaluation of low BP, abdominal pain, vomiting for past 4-5 days.  Patient states his BP was low. He felt dehydrated Patient endorses nonsteroidal use CT of the abdomen without contrast done in the emergency room today shows persistent evidence of acute pancreatitis, presumably evolving pancreatic pseudocysts, partially calcified nodal mass, biliary sludge.  Patient has elevated bilirubin, AST and ALT.  ERCP is recommended by GI physician.  Patient is scheduled to be transferred to [facility] for further evaluation. Work-up in the ER also shows that patient's creatinine is critically elevated at 10.64.  Last known creatinine from January 15 is 2.19    Medications: Outpatient medications: (Not in a hospital admission)   Current medications: Current Facility-Administered Medications  Medication Dose Route Frequency Provider Last Rate Last Admin  . [START ON 02/09/2020] piperacillin-tazobactam (ZOSYN) IVPB 3.375 g  3.375 g Intravenous Q12H Lilia Pro., MD      . piperacillin-tazobactam (ZOSYN) IVPB 3.375 g  3.375 g Intravenous Once Lilia Pro., MD       Current Outpatient Medications  Medication Sig Dispense Refill  . acetaminophen (TYLENOL) 500 MG tablet Take 500-1,000 mg by mouth every 6 (six) hours as needed for moderate pain or headache.     . albuterol (PROAIR HFA) 108 (90 Base) MCG/ACT inhaler Inhale 2 puffs into the lungs every 4 (four) hours as needed for  wheezing or shortness of breath.     . budesonide-formoterol (SYMBICORT) 80-4.5 MCG/ACT inhaler Inhale 2 puffs into the lungs 2 (two) times daily. Takes at noon and at 4pm.    . diltiazem (CARTIA XT) 120 MG 24 hr capsule Take 1 capsule (120 mg total) by mouth 2 (two) times daily. 180 capsule 1  . ELIQUIS 5 MG TABS tablet Take 1 tablet by mouth twice daily 180 tablet 1  . HYDROcodone-acetaminophen (NORCO/VICODIN) 5-325 MG tablet Take 1-2 tablets by mouth every 4 (four) hours as needed for moderate pain. 30 tablet 0  . Insulin Glargine (LANTUS SOLOSTAR) 100 UNIT/ML Solostar Pen Inject 16 Units into the skin at bedtime.    Marland Kitchen levothyroxine (SYNTHROID, LEVOTHROID) 50 MCG tablet Take 50 mcg by mouth daily.    Marland Kitchen losartan (COZAAR) 25 MG tablet Take 1 tablet (25 mg total) by mouth daily. 90 tablet 3  . metoprolol succinate (TOPROL-XL) 50 MG 24 hr tablet TAKE 1 TABLET BY MOUTH IN THE EVENING. TAKE WITH OR IMMEDIATELY FOLLOWING A MEAL 90 tablet 3  . omeprazole (PRILOSEC) 20 MG capsule Take 20 mg by mouth daily.    . rosuvastatin (CRESTOR) 40 MG tablet Take 1 tablet (40 mg total) by mouth daily. 90 tablet 3  . vitamin B-12 (CYANOCOBALAMIN) 500 MCG tablet Take  1,000 mcg by mouth daily.         Allergies: Allergies  Allergen Reactions  . Levaquin [Levofloxacin In D5w] Swelling and Rash      Past Medical History: Past Medical History:  Diagnosis Date  . Aortic atherosclerosis ([place])    As seen on 04/2019 CT  . Atrial flutter ([place])    a. s/p ablation 2017/2018 and DCCV 2015/2016/2019 b. 2017 echo EF 60-65%, no RWMA, mild MR,  . CAD (coronary artery disease)    3v CAD 04/2019 CT  . Carcinoid tumor    metastatic  . CKD (chronic kidney disease), stage III   . COPD (chronic obstructive pulmonary disease) ([facility])    PM oxygen  . Current occasional smoker    1-2 cigarettes / week  . Diabetes mellitus type 2, insulin dependent ([place])   . Heart failure with preserved ejection fraction ([place])    2017 echo EF  60-65%, no RWMA, mild MR,    . Hyperlipidemia   . Hypertension   . Pancreatitis   . Polycythemia      Past Surgical History: Past Surgical History:  Procedure Laterality Date  . A-FLUTTER ABLATION N/A 10/14/2017   Procedure: A-FLUTTER ABLATION;  Surgeon: Constance Haw, MD;  Location: [facility];  Service: Cardiovascular;  Laterality: N/A;  . CARDIOVERSION N/A 09/08/2017   Procedure: CARDIOVERSION;  Surgeon: [name], MD;  Location: [facility] ORS;  Service: Cardiovascular;  Laterality: N/A;  . CARDIOVERSION N/A 03/22/2018   Procedure: CARDIOVERSION;  Surgeon: [name], MD;  Location: [facility] OR[facility];  Service: Cardiovascular;  Laterality: N/A;  . COLONOSCOPY    . COLONOSCOPY WITH PROPOFOL N/A 10/31/2019   Procedure: COLONOSCOPY WITH PROPOFOL;  Surgeon: Lin Landsman, MD;  Location: [facility] ENDOSCOPY;  Service: Gastroenterology;  Laterality: N/A;  . ELECTROPHYSIOLOGIC STUDY N/A 10/27/2016   Procedure: A-Flutter Ablation;  Surgeon: Deboraha Sprang, MD;  Location: [place] [facility];  Service: Cardiovascular;  Laterality: N/A;  . EUS N/A 12/22/2017   Procedure: FULL UPPER ENDOSCOPIC ULTRASOUND (EUS) RADIAL;  Surgeon: Reita Cliche, MD;  Location: [facility] ENDOSCOPY;  Service: Gastroenterology;  Laterality: N/A;  . TEE WITH CARDIOVERSION  12/16     Family History: Family History  Problem Relation Age of Onset  . Hypertension Father      Social History: Social History   Socioeconomic History  . Marital status: Widowed    Spouse name: Not on file  . Number of children: Not on file  . Years of education: Not on file  . Highest education level: Not on file  Occupational History  . Not on file  Tobacco Use  . Smoking status: Light Tobacco Smoker    Years: 45.00    Types: Cigarettes    Last attempt to quit: 11/27/2012    Years since quitting: 7.2  . Smokeless tobacco: Never Used  Substance and Sexual Activity  . Alcohol use: Yes    Alcohol/week: 2.0  standard drinks    Types: 2 Cans of beer per week    Comment: beer every once a while once a monthnone last 24hrs  . Drug use: No  . Sexual activity: Not on file  Other Topics Concern  . Not on file  Social History Narrative  . Not on file   Social Determinants of Health   Financial Resource Strain:   . Difficulty of Paying Living Expenses:   Food Insecurity:   . Worried About Charity fundraiser in the Last Year:   .  Ran Out of Food in the Last Year:   Transportation Needs:   . Film/video editor (Medical):   Marland Kitchen Lack of Transportation (Non-Medical):   Physical Activity:   . Days of Exercise per Week:   . Minutes of Exercise per Session:   Stress:   . Feeling of Stress :   Social Connections:   . Frequency of Communication with Friends and Family:   . Frequency of Social Gatherings with Friends and Family:   . Attends Religious Services:   . Active Member of Clubs or Organizations:   . Attends Archivist Meetings:   Marland Kitchen Marital Status:   Intimate Partner Violence:   . Fear of Current or Ex-Partner:   . Emotionally Abused:   Marland Kitchen Physically Abused:   . Sexually Abused:      Review of Systems: Gen: Denies any fevers or chills HEENT: No vision or hearing complaints CV: No chest pain or shortness of breath Resp: No cough or sputum production GI: Decreased appetite, vomiting GU : Decreased urine output. MS: No acute complaints Derm: No complaints  Psych: No complaints Heme: No complaints Neuro: No complaints Endocrine.  Metastatic carcinoid tumor  Vital Signs: Blood pressure 95/61, pulse 78, temperature 98.3 F (36.8 C), temperature source Oral, resp. rate 14, height 5\' 7"  (1.702 m), weight 72.1 kg, SpO2 99 %.  No intake or output data in the 24 hours ending 02/08/20 1557  Weight trends: Filed Weights   02/08/20 0955  Weight: 72.1 kg    Physical Exam: General:  Alert, laying in the bed, no distress  HEENT  anicteric, dry oral mucous membranes   Neck:  Supple  Lungs:  Normal breathing effort on room air, clear to auscultation  Heart::  Tachycardic, no rub  Abdomen:  Soft  Extremities:  No peripheral edema  Neurologic:  Alert, able to answer questions appropriately  Skin:  Warm, dry       Lab results: Basic Metabolic Panel: Recent Labs  Lab 02/08/20 1015  NA 133*  K 4.6  CL 99  CO2 19*  GLUCOSE 269*  BUN 88*  CREATININE 10.64*  CALCIUM 7.9*  MG 2.3  PHOS 6.9*    Liver Function Tests: Recent Labs  Lab 02/08/20 1015  AST 76*  ALT 125*  ALKPHOS 730*  BILITOT 8.9*  PROT 6.6  ALBUMIN 3.3*   Recent Labs  Lab 02/08/20 1015  LIPASE 257*   No results for input(s): AMMONIA in the last 168 hours.  CBC: Recent Labs  Lab 02/08/20 1015  WBC 7.3  NEUTROABS 5.5  HGB 14.8  HCT 44.6  MCV 91.2  PLT 121*    Cardiac Enzymes: No results for input(s): CKTOTAL, TROPONINI in the last 168 hours.  BNP: Invalid input(s): POCBNP  CBG: No results for input(s): GLUCAP in the last 168 hours.  Microbiology: No results found for this or any previous visit (from the past 720 hour(s)).   Coagulation Studies: Recent Labs    02/08/20 1502  LABPROT 18.9*  INR 1.6*    Urinalysis: No results for input(s): COLORURINE, LABSPEC, PHURINE, GLUCOSEU, HGBUR, BILIRUBINUR, KETONESUR, PROTEINUR, UROBILINOGEN, NITRITE, LEUKOCYTESUR in the last 72 hours.  Invalid input(s): APPERANCEUR      Imaging: CT Abdomen Pelvis Wo Contrast  Result Date: 02/08/2020 CLINICAL DATA:  74 year old male with history of abdominal pain and vomiting. EXAM: CT ABDOMEN AND PELVIS WITHOUT CONTRAST TECHNIQUE: Multidetector CT imaging of the abdomen and pelvis was performed following the standard protocol without IV contrast. COMPARISON:  CT the abdomen and pelvis 04/24/2019. FINDINGS: Lower chest: Aortic atherosclerosis. Calcified atherosclerotic plaque in the left circumflex and right coronary arteries. Hepatobiliary: No definite suspicious  cystic or solid hepatic lesions are confidently identified on today's noncontrast CT examination. Amorphous intermediate attenuation material lying dependently in the gallbladder compatible with biliary sludge, as well as a tiny 3 mm calcified gallstone. No inflammatory changes surrounding the gallbladder to suggest an acute cholecystitis at this time. Pancreas: There are again inflammatory changes surrounding the pancreas, indicative of pancreatitis. Some low-attenuation areas are now noted in and adjacent to the pancreas, including a 2.2 x 1.6 cm area in the body of the pancreas (axial image 24 of series 2), and a larger 4.0 x 3.0 x 3.7 cm area cephalad to the body and tail of the pancreas intimately associated with the undersurface of the lesser curvature of the proximal stomach (axial image 19 of series 2 and coronal image 36 of series 5), presumably developing pancreatic pseudocysts. Spleen: Unremarkable. Adrenals/Urinary Tract: Unenhanced appearance of the kidneys and right adrenal gland is normal. 2.5 x 2.4 cm low-attenuation (0 HU) left adrenal nodule which is partially calcified, stable compared to the prior examination, compatible with an adenoma. No hydroureteronephrosis. Urinary bladder is normal in appearance. Stomach/Bowel: Normal appearance of the stomach. No pathologic dilatation of small bowel or colon. A few scattered colonic diverticulae are noted, particularly in the sigmoid colon, without surrounding inflammatory changes to suggest an acute diverticulitis at this time. The appendix is not confidently identified and may be surgically absent. Regardless, there are no inflammatory changes noted adjacent to the cecum to suggest the presence of an acute appendicitis at this time. Vascular/Lymphatic: Aortic atherosclerosis. Partially calcified nodal mass in the central small bowel mesentery (axial image 47 of series 2) measuring 3.2 x 1.1 cm, grossly similar to prior studies allowing for slight  difference in patient positioning. 11 mm short axis mesenteric lymph node adjacent to the superior mesenteric artery (axial image 35 of series 2), increased from 9 mm on the prior examination. Reproductive: Prostate gland and seminal vesicles are unremarkable in appearance. Other: No significant volume of ascites.  No pneumoperitoneum. Musculoskeletal: There are no aggressive appearing lytic or blastic lesions noted in the visualized portions of the skeleton. IMPRESSION: 1. Persistent evidence of acute pancreatitis with two pancreatic and peripancreatic fluid collections, which are presumably evolving pancreatic pseudocysts, as detailed above. 2. Peripancreatic lymph node in the proximal small bowel mesentery adjacent to the superior mesenteric artery measuring 11 mm, increased in size compared to the prior study. Whether this is reactive or indicative of metastatic disease from suspected pancreatic head mass (which is not confidently identified on today's noncontrast CT examination) is uncertain. Other previously noted partially calcified nodal mass more caudally located in the small bowel mesentery is roughly stable in size allowing for differences in position of the lesion (previously hypermetabolic on Dotatate PET scan, indicative of a neuroendocrine tumor). 3. Small volume of biliary sludge and tiny calcified gallstone lying dependently in the gallbladder. No findings to suggest an acute cholecystitis at this time. 4. Aortic atherosclerosis, in addition to at least 2 vessel coronary artery disease. Assessment for potential risk factor modification, dietary therapy or pharmacologic therapy may be warranted, if clinically indicated. 5. Colonic diverticulosis without evidence of acute diverticulitis at this time. Electronically Signed   By: Vinnie Langton M.D.   On: 02/08/2020 12:06   US ABDOMEN LIMITED RUQ  Result Date: 02/08/2020 CLINICAL DATA:  Elevated bilirubin EXAM: ULTRASOUND ABDOMEN  LIMITED RIGHT  UPPER QUADRANT COMPARISON:  None Correlation: CT abdomen pelvis 02/08/2020 FINDINGS: Gallbladder: 5 mm intraluminal echogenic focus, not definitely mobile; this could represent a small polyp or could represent a small adherent gallstone. Patient had a 3 mm gallstone on CT. No gallbladder wall thickening, pericholecystic fluid or sonographic Murphy sign. Common bile duct: Diameter: 12 mm, dilated Liver: Normal echogenicity. Hepatic contour appears subtly nodular/irregular cannot exclude cirrhosis, though not well demonstrated on CT. No hepatic masses. Minimal intrahepatic biliary dilatation. Portal vein is patent on color Doppler imaging with normal direction of blood flow towards the liver. Other: No RIGHT upper quadrant free fluid. IMPRESSION: Dilated CBD with minimal intrahepatic biliary dilatation, recommend correlation with LFTs. Questionable 5 mm gallstone versus gallbladder polyp. Hepatic contours appear subtly nodular, cannot exclude cirrhosis. Electronically Signed   By: Lavonia Dana M.D.   On: 02/08/2020 12:28      Assessment & Plan: Pt is a 74 y.o. caucasian  male with Atrial flutter, coronary disease, carcinoid tumor, metastatic, CKD, COPD, diabetes, insulin-dependent, heart failure with preserved ejection fraction, hypertension, pancreatitis, was admitted on 02/08/2020 with Intermittent abdominal pain [R10.9] Elevated LFTs [R79.89] Vomiting in adult [R11.10] Elevated bilirubin [R17] Hypotension, unspecified hypotension type [I95.9] Acute renal failure, unspecified acute renal failure type ([place]) [N17.9] Acute pancreatitis, unspecified complication status, unspecified pancreatitis type [K85.90]  1.  Acute kidney injury on chronic kidney disease stage IV Baseline creatinine of 2.19/GFR 28 from November 30, 2019 AKI is likely secondary to ATN.  Likely contribution from hypotension, volume depletion, ARB Patient is currently getting volume resuscitation Will obtain urinalysis Imaging is  negative for obstruction We will request urgent dialysis catheter placement for metabolic optimization in case patient needs operative intervention in the next few days Discussed situation with patient.  He agrees to proceed with dialysis catheter placement as well as short treatment of dialysis Assess daily for need of further dialysis Case was discussed with ER physician.       LOS: 0 Dominiqua Cooner 3/26/20213:57 PM    Note: This note was prepared with Dragon dictation. Any transcription errors are unintentional

## 2020-02-08 NOTE — ED Notes (Signed)
Pt reports having intermittent nausea this week. Pt states that he has vomited x 2. Pt last vomited last night after taking Miralax. Pt c/o RLQ abd pain on palpation.   Pt blood pressure noted to be in the 57F systolic. Pt states that BP was low at his PCP appt this morning as well which is why he was sent to ED. Pt denies dizziness, does not have dizziness when changing positions. Pt is A & O x 4.

## 2020-02-08 NOTE — ED Notes (Signed)
EKG to [facility]. Pt in A flutter currently with occasional PVCs.

## 2020-02-08 NOTE — ED Notes (Signed)
EDP [facility] at bedside.  

## 2020-02-08 NOTE — ED Provider Notes (Signed)
-----------------------------------------   4:00 PM on 02/08/2020 -----------------------------------------  Patient care assumed from Dr. Joan Mayans.  I have personally seen and spoke to the patient, he wants to go to [facility] for his procedure.  I spoke to [facility] as the patients lab work shows that he is quite ill, to see if we can get the patient there sooner.  They state they are on full diversion at this time but they have made an exception for the patient as he already has a procedure scheduled for Monday.  They will accept the patient as a transfer they are just awaiting bed availability, and are unable to transfer ED to ED at this time due to significant ED boarding of patients.  They are hopeful that he will have a bed tomorrow.  Given this I spoke to our hospitalist to see if the patient could be admitted locally.  Since we do not have a GI physician available over the weekend who could do an ERCP if needed they are not able to admit the patient locally.  As the patient has what appears to be acute renal failure in addition to his biliary duct obstruction I consulted nephrology.  I spoke to Dr. Candiss Norse who states the patient likely will need dialysis for stabilization prior to ERCP.  I spoke to vascular surgery Dr. Corene Cornea dew they are able to place a catheter this evening for the patient and dialysis would likely occur this evening/tonight.  Dr. Candiss Norse will see the patient in the emergency department.  We will continue with gentle IV hydration.  Given the biliary obstruction we are covering with antibiotics as a precaution to prevent cholangitis, although no signs of cholangitis currently with a normal white blood cell count and a normal temperature.  I discussed this at length with the patient who is agreeable to the plan of care and still wishes to proceed with the plan to go to [facility] once a bed is available.  Currently patient is sitting up on the side of the bed, no  distress.   ----------------------------------------- 10:54 PM on 02/08/2020 -----------------------------------------  Patient has had his temporary dialysis catheter placed by vascular surgery.  Currently in dialysis.   Harvest Dark, MD 02/08/20 2255

## 2020-02-08 NOTE — ED Notes (Signed)
This RN to bedside to meet pt. Pt at imaging and family member at bedside.

## 2020-02-08 NOTE — ED Notes (Signed)
Pt's eyes jaundiced. Pt up to bedside toilet.

## 2020-02-08 NOTE — ED Provider Notes (Addendum)
[facility] Emergency Department Provider Note  ____________________________________________   First MD Initiated Contact with Patient 02/08/20 1002     (approximate)  I have reviewed the triage vital signs and the nursing notes.  History  Chief Complaint Hypotension    HPI [name] is a 74 y.o. male with a history of atrial flutter on anticoagulation, CAD, metastatic small bowel carcinoid tumor, CKD, COPD, DM, pancreatitis who presents to the emergency department for for low blood pressure, intermittent abdominal pain, vomiting.  Patient states he has had intermittent abdominal pain over the last 4 to 5 days.  Located to the lower abdomen, primarily right-sided, aching.  Comes and goes intermittently, does seem to be worsened by eating.  No apparent alleviating factors.  He denies any pain at present, 0/10.  Has had a few episodes of nonbloody, nonbilious emesis this week as well, no more than 2 or 3. Otherwise reports adequate PO intake. Reports constipation over this time period, tried taking MiraLax but threw it up. No diarrhea. Was initially at his PCP office this AM, when he was noted to be hypotensive and therefore referred to the emergency department.  He denies any lightheadedness, dizziness, or weakness.  No fevers.  Denies any hematuria or dysuria.  No sick contacts.   Past Medical Hx Past Medical History:  Diagnosis Date  . Aortic atherosclerosis ([place])    As seen on 04/2019 CT  . Atrial flutter ([place])    a. s/p ablation 2017/2018 and DCCV 2015/2016/2019 b. 2017 echo EF 60-65%, no RWMA, mild MR,  . CAD (coronary artery disease)    3v CAD 04/2019 CT  . Carcinoid tumor    metastatic  . CKD (chronic kidney disease), stage III   . COPD (chronic obstructive pulmonary disease) (HCC)    PM oxygen  . Current occasional smoker    1-2 cigarettes / week  . Diabetes mellitus type 2, insulin dependent ([place])   . Heart failure with preserved  ejection fraction ([place])    2017 echo EF 60-65%, no RWMA, mild MR,    . Hyperlipidemia   . Hypertension   . Pancreatitis   . Polycythemia     Problem List Patient Active Problem List   Diagnosis Date Noted  . Secondary hyperparathyroidism of renal origin ([place]) 08/20/2019  . Stage 3b chronic kidney disease 08/20/2019  . Elevated serum creatinine 07/03/2019  . Acute pancreatitis 11/25/2018  . S/P ablation of atrial flutter 10/15/2017  . Atrial flutter ([place]) 10/14/2017  . Leukocytosis 11/19/2016  . Erythrocytosis 11/19/2016  . Diabetes type 2, uncontrolled ([place]) 09/01/2015  . 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 CAD (coronary artery disease) 09/01/2015  . Left main coronary artery disease 09/01/2015  . COPD type A ([place]) 01/02/2015  . Encounter for screening colonoscopy 01/02/2015  . Typical atrial flutter ([place]) 11/27/2014  . Smoking history 11/27/2014  . Emphysema of lung ([place]) 11/27/2014  . Pneumonia, organism unspecified(486) 11/27/2014  . Essential hypertension 11/27/2014  . Adrenal nodule ([place]) 06/11/2014  . B12 deficiency 06/11/2014  . Hypertriglyceridemia 06/11/2014  . Microalbuminuria 06/11/2014  . Type 2 diabetes mellitus ([place]) 06/06/2014    Past Surgical Hx Past Surgical History:  Procedure Laterality Date  . A-FLUTTER ABLATION N/A 10/14/2017   Procedure: A-FLUTTER ABLATION;  Surgeon: Constance Haw, MD;  Location: [place] [facility] LAB;  Service: Cardiovascular;  Laterality: N/A;  . CARDIOVERSION N/A 09/08/2017   Procedure: CARDIOVERSION;  Surgeon: [name], MD;  Location: [facility]  ORS;  Service: Cardiovascular;  Laterality: N/A;  . CARDIOVERSION N/A 03/22/2018   Procedure: CARDIOVERSION;  Surgeon: [name], MD;  Location: [facility] ORS;  Service: Cardiovascular;  Laterality: N/A;  . COLONOSCOPY    . COLONOSCOPY WITH PROPOFOL N/A 10/31/2019   Procedure: COLONOSCOPY WITH PROPOFOL;  Surgeon: Lin Landsman, MD;  Location: [facility]  ENDOSCOPY;  Service: Gastroenterology;  Laterality: N/A;  . ELECTROPHYSIOLOGIC STUDY N/A 10/27/2016   Procedure: A-Flutter Ablation;  Surgeon: Deboraha Sprang, MD;  Location: [place] [facility] LAB;  Service: Cardiovascular;  Laterality: N/A;  . EUS N/A 12/22/2017   Procedure: FULL UPPER ENDOSCOPIC ULTRASOUND (EUS) RADIAL;  Surgeon: Reita Cliche, MD;  Location: [facility] ENDOSCOPY;  Service: Gastroenterology;  Laterality: N/A;  . TEE WITH CARDIOVERSION  12/16    Medications Prior to Admission medications   Medication Sig Start Date End Date Taking? Authorizing Provider  acetaminophen (TYLENOL) 500 MG tablet Take 500-1,000 mg by mouth every 6 (six) hours as needed for moderate pain or headache.     [provider]  albuterol (PROAIR HFA) 108 (90 Base) MCG/ACT inhaler Inhale 2 puffs into the lungs every 4 (four) hours as needed for wheezing or shortness of breath.     [provider]  budesonide-formoterol (SYMBICORT) 80-4.5 MCG/ACT inhaler Inhale 2 puffs into the lungs 2 (two) times daily. Takes at noon and at 4pm.    [provider]  diltiazem (CARTIA XT) 120 MG 24 hr capsule Take 1 capsule (120 mg total) by mouth 2 (two) times daily. 11/26/19   [name], MD  ELIQUIS 5 MG TABS tablet Take 1 tablet by mouth twice daily 01/17/20   [name], MD  HYDROcodone-acetaminophen (NORCO/VICODIN) 5-325 MG tablet Take 1-2 tablets by mouth every 4 (four) hours as needed for moderate pain. 11/28/18   Henreitta Leber, MD  Insulin Glargine (LANTUS SOLOSTAR) 100 UNIT/ML Solostar Pen Inject 16 Units into the skin at bedtime. 11/14/19 11/13/20  [provider]  levothyroxine (SYNTHROID, LEVOTHROID) 50 MCG tablet Take 50 mcg by mouth daily. 06/13/18   [provider]  losartan (COZAAR) 25 MG tablet Take 1 tablet (25 mg total) by mouth daily. 03/27/18   [name], MD  metoprolol succinate (TOPROL-XL) 50 MG 24 hr tablet TAKE 1 TABLET BY MOUTH IN THE EVENING. TAKE  WITH OR IMMEDIATELY FOLLOWING A MEAL 09/27/19   Marrianne Mood D, PA-C  omeprazole (PRILOSEC) 20 MG capsule Take 20 mg by mouth daily. 11/16/19   [provider]  rosuvastatin (CRESTOR) 40 MG tablet Take 1 tablet (40 mg total) by mouth daily. 12/03/19   Theora Gianotti, NP  vitamin B-12 (CYANOCOBALAMIN) 500 MCG tablet Take 1,000 mcg by mouth daily.     [provider]    Allergies Levaquin [levofloxacin in d5w]  Family Hx Family History  Problem Relation Age of Onset  . Hypertension Father     Social Hx Social History   Tobacco Use  . Smoking status: Light Tobacco Smoker    Years: 45.00    Types: Cigarettes    Last attempt to quit: 11/27/2012    Years since quitting: 7.2  . Smokeless tobacco: Never Used  Substance Use Topics  . Alcohol use: Yes    Alcohol/week: 2.0 standard drinks    Types: 2 Cans of beer per week    Comment: beer every once a while once a monthnone last 24hrs  . Drug use: No     Review of Systems  Constitutional: Negative for fever. Negative for chills.  Positive for low blood pressure. Eyes: Negative for visual changes. ENT: Negative for sore throat. Cardiovascular: Negative for chest pain. Respiratory: Negative for shortness of breath. Gastrointestinal: Positive for abdominal pain, vomiting. Genitourinary: Negative for dysuria. Musculoskeletal: Negative for leg swelling. Skin: Negative for rash. Neurological: Negative for headaches.   Physical Exam  Vital Signs: ED Triage Vitals [02/08/20 0955]  Enc Vitals Group     BP (!) 88/45     Pulse Rate 94     Resp 16     Temp 98.3 F (36.8 C)     Temp Source Oral     SpO2 100 %     Weight 159 lb (72.1 kg)     Height 5' 7"  (1.702 m)     Head Circumference      Peak Flow      Pain Score 0     Pain Loc      Pain Edu?      Excl. in [place]?     Constitutional: Alert and oriented. Elderly.  Head: Normocephalic. Atraumatic. Eyes: Conjunctivae clear, sclera moderately  icteric. Pupils equal and symmetric. Nose: No masses or lesions. No congestion or rhinorrhea. Mouth/Throat: Wearing mask.  Neck: No stridor. Trachea midline.  Cardiovascular: Normal rate. Extremities well perfused. Respiratory: Normal respiratory effort.  Lungs CTAB. Gastrointestinal: Soft. Non-distended. Non-tender throughout.  Genitourinary: Deferred. Musculoskeletal: No lower extremity edema. No deformities. Neurologic:  Normal speech and language. No gross focal or lateralizing neurologic deficits are appreciated.  Skin: Skin is warm, dry and intact. No rash noted. Psychiatric: Mood and affect are appropriate for situation.  EKG  Obtained 12:39 PM. 02/08/20. Personally reviewed  Rate: 72 Rhythm: atrial flutter Axis: leftward Intervals: within normal limits RBBB and LAFB PVC   Radiology  CT A/P  IMPRESSION:  1. Persistent evidence of acute pancreatitis with two pancreatic and peripancreatic fluid collections, which are presumably evolving pancreatic pseudocysts, as detailed above.  2. Peripancreatic lymph node in the proximal small bowel mesentery adjacent to the superior mesenteric artery measuring 11 mm, increased in size compared to the prior study. Whether this is reactive or indicative of metastatic disease from suspected pancreatic head mass (which is not confidently identified on today's  noncontrast CT examination) is uncertain. Other previously noted partially calcified nodal mass more caudally located in the small bowel mesentery is roughly stable in size allowing for differences in position of the lesion (previously hypermetabolic on Dotatate PET scan, indicative of a neuroendocrine tumor).  3. Small volume of biliary sludge and tiny calcified gallstone lying dependently in the gallbladder. No findings to suggest an acute cholecystitis at this time.  4. Aortic atherosclerosis, in addition to at least 2 vessel coronary artery disease. Assessment for potential risk factor  modification, dietary therapy or pharmacologic therapy may be warranted, if clinically indicated.  5. Colonic diverticulosis without evidence of acute diverticulitis at this time.   [place] RUQ IMPRESSION:  Dilated CBD with minimal intrahepatic biliary dilatation, recommend correlation with LFTs.  Questionable 5 mm gallstone versus gallbladder polyp.  Hepatic contours appear subtly nodular, cannot exclude cirrhosis.    Procedures  Procedure(s) performed (including critical care):  .Critical Care Performed by: Lilia Pro., MD Authorized by: Lilia Pro., MD   Critical care provider statement:    Critical care time (minutes):  35   Critical care was time spent personally by me on the following activities:  Discussions with consultants, evaluation of patient's response to treatment, examination  of patient, ordering and performing treatments and interventions, ordering and review of laboratory studies, ordering and review of radiographic studies, pulse oximetry, re-evaluation of patient's condition, obtaining history from patient or surrogate and review of old charts     Initial Impression / Assessment and Plan / MDM / ED Course  74 y.o. male who presents to the ED for intermittent abdominal pain, vomiting, noted to be hypotensive in clinic. Patient with history of atrial flutter on anticoagulation, CAD, metastatic small bowel carcinoid tumor, CKD, COPD, DM, pancreatitis   Ddx: appendicitis, recurrent pancreatitis, biliary disease, bowel obstruction, worsening of his known carcinoid disease  Will plan for labs, fluids, imaging  Labs notable for creatinine 10.6 from 2 previously. Normal potassium. Normal hemoglobin.  Elevated total bilirubin 8.9, AST 76, ALT 125, alk phos 730. All previously normal 2 months ago. CT and US imaging as above - notable for acute pancreatitis, pancreatic pseudocysts, gallstone w/o evidence of acute cholecystitis, and dilated CBD on ultrasound to 12 mm.  Concern for worsening of his carcinoid disease (known to be located in the pancreatic head) w/ obstructive effect vs gallstone pancreatitis. Will cover with Zosyn, though low suspicion for cholangitis at this time as he is afebrile, no WBC count. Receiving 2 L IVF bolus and then infusion rate.  Discussed case with Dr. Vicente Males of GI, patient will likely require ERCP. Unfortunately we do not have that availability here at [facility] at present as Dr. Allen Norris is not on call, and recommends transfer.   Discussed need for transfer with patient, who requests [facility] as he has received prior care there.  3:01 PM Discussed with [facility], who accept patient for transfer under Dr. Illene Bolus. They will tentatively plan for ERCP on Monday. Anticipate patient will likely board in the ED while awaiting transfer given [facility] is at capacity at present, will consult pharmacy for continued Zosyn dosing. Discussed with Dr. Leslye Peer of hospitalist service here regarding possibility of consulting on patient while boarding awaiting bed at [facility] to assist w/ management.   _______________________________   As part of my medical decision making I have reviewed available labs, radiology tests, reviewed old records, obtained additional history from family (wife at bedside), and discussed with consultants (Dr. Vicente Males, GI. Dr. Janese Banks, Oncology).    Final Clinical Impression(s) / ED Diagnosis  Final diagnoses:  Hypotension, unspecified hypotension type  Intermittent abdominal pain  Vomiting in adult  Elevated bilirubin  Elevated LFTs  Acute pancreatitis, unspecified complication status, unspecified pancreatitis type       Note:  This document was prepared using Dragon voice recognition software and may include unintentional dictation errors.     Lilia Pro., MD 02/08/20 [phone]

## 2020-02-08 NOTE — ED Triage Notes (Signed)
Pt states that he was seen at his pcp this am for his regular check up and was sent to the ER due to low bp, pt denies feeling weak or dizzy, reports that he drove himself to his appt as well as to the ER.  Pt states that he has had rt sided abd pain that radiates across his abd, pt states that he vomited twice this week also and thought maybe that he needed a laxative, small bm yesterday, denies pain at this time

## 2020-02-08 NOTE — Progress Notes (Signed)
Pharmacy Antibiotic Note  [name] is a 74 y.o. male admitted on 02/08/2020 with intra-abdominal infection/pancreatitis.  Pharmacy has been consulted for Zosyn dosing. -N/V, not on hemodialysis outpt  Plan: Start Zosyn EI 3.375 gm IV q12h for  Crcl 5.6 ml/min    Height: 5\' 7"  (170.2 cm) Weight: 159 lb (72.1 kg) IBW/kg (Calculated) : 66.1  Temp (24hrs), Avg:98.3 F (36.8 C), Min:98.3 F (36.8 C), Max:98.3 F (36.8 C)  Recent Labs  Lab 02/08/20 1015  WBC 7.3  CREATININE 10.64*    Estimated Creatinine Clearance: 5.6 mL/min (A) (by C-G formula based on SCr of 10.64 mg/dL (H)).    Allergies  Allergen Reactions  . Levaquin [Levofloxacin In D5w] Swelling and Rash    Antimicrobials this admission: Zosyn 3/26 >>     Dose adjustments this admission:    Microbiology results:   BCx:     UCx:      Sputum:      MRSA PCR:    Thank you for allowing pharmacy to be a part of this patient's care.  Tysheka Fanguy A 02/08/2020 3:41 PM

## 2020-02-08 NOTE — ED Notes (Signed)
Pt given snack and drink with verbal okay from [place].

## 2020-02-08 NOTE — ED Notes (Signed)
Explained delay to pt. Pt wodnering if he can eat. EDP Paduch[place] notified of pt's request for food. Pt denies any other needs currently. Bed locked low. Rail up. Call bell within reach.

## 2020-02-08 NOTE — ED Notes (Addendum)
Pt up to bedside toilet to urinate. Steady. Updated.

## 2020-02-08 NOTE — ED Notes (Signed)
Report given to staff placing temporary dialysis access.

## 2020-02-08 NOTE — ED Notes (Signed)
Family at bedside. 

## 2020-02-08 NOTE — ED Notes (Signed)
Dialysis RN called this RN. States needs covid swab to result before she can take pt. Swab still in process.

## 2020-02-08 NOTE — Op Note (Signed)
  OPERATIVE NOTE   PROCEDURE: 1. Ultrasound guidance for vascular access right juglar vein 2. Placement of a 20 cm triple lumen dialysis catheter right jugular vein  PRE-OPERATIVE DIAGNOSIS: 1. Acute renal failure   POST-OPERATIVE DIAGNOSIS: Same  SURGEON: Leotis Pain, MD  ASSISTANT(S): None  ANESTHESIA: local  ESTIMATED BLOOD LOSS: Minimal   FINDING(S): 1. None  SPECIMEN(S): None  INDICATIONS:  Patient is a 74 y.o.male who presents with renal failure and need for dialysis.  Risks and benefits were discussed, and informed consent was obtained.  DESCRIPTION: After obtaining full informed written consent, the patient was laid flat in the bed. The right neck was sterilely prepped and draped in a sterile surgical field was created. The right internal jugular vein was visualized with ultrasound and found to be widely patent. It was then accessed under direct guidance without difficulty with a Seldinger needle and a permanent image was recorded. A J-wire was then placed. After skin nick and dilatation, a 30 cm triple lumen dialysis catheter was placed over the wire and the wire was removed. The lumens withdrew dark red nonpulsatile blood and flushed easily with sterile saline. The catheter was secured to the skin with 3 nylon sutures. Sterile dressing was placed. Stat CXR is pending  COMPLICATIONS: None  CONDITION: Stable  Leotis Pain 02/08/2020 5:16 PM  This note was created with Dragon Medical transcription system. Any errors in dictation are purely unintentional.

## 2020-02-08 NOTE — ED Notes (Signed)
Pt back from imaging. Introduced self to pt. Adjusted HOB for pt. Pt denies any needs currently. Bed locked low. Rail up. Call bell within reach.

## 2020-02-09 DIAGNOSIS — F17210 Nicotine dependence, cigarettes, uncomplicated: Secondary | ICD-10-CM | POA: Diagnosis not present

## 2020-02-09 DIAGNOSIS — J449 Chronic obstructive pulmonary disease, unspecified: Secondary | ICD-10-CM | POA: Diagnosis not present

## 2020-02-09 DIAGNOSIS — R17 Unspecified jaundice: Secondary | ICD-10-CM | POA: Diagnosis not present

## 2020-02-09 DIAGNOSIS — I2510 Atherosclerotic heart disease of native coronary artery without angina pectoris: Secondary | ICD-10-CM | POA: Diagnosis not present

## 2020-02-09 DIAGNOSIS — L0231 Cutaneous abscess of buttock: Secondary | ICD-10-CM | POA: Diagnosis not present

## 2020-02-09 DIAGNOSIS — N189 Chronic kidney disease, unspecified: Secondary | ICD-10-CM | POA: Diagnosis not present

## 2020-02-09 DIAGNOSIS — I483 Typical atrial flutter: Secondary | ICD-10-CM | POA: Diagnosis not present

## 2020-02-09 DIAGNOSIS — K921 Melena: Secondary | ICD-10-CM | POA: Diagnosis not present

## 2020-02-09 DIAGNOSIS — J181 Lobar pneumonia, unspecified organism: Secondary | ICD-10-CM | POA: Diagnosis not present

## 2020-02-09 DIAGNOSIS — Z20822 Contact with and (suspected) exposure to covid-19: Secondary | ICD-10-CM | POA: Diagnosis not present

## 2020-02-09 DIAGNOSIS — Z79899 Other long term (current) drug therapy: Secondary | ICD-10-CM | POA: Diagnosis not present

## 2020-02-09 DIAGNOSIS — Z7901 Long term (current) use of anticoagulants: Secondary | ICD-10-CM | POA: Diagnosis not present

## 2020-02-09 DIAGNOSIS — C7A8 Other malignant neuroendocrine tumors: Secondary | ICD-10-CM | POA: Diagnosis not present

## 2020-02-09 DIAGNOSIS — I959 Hypotension, unspecified: Secondary | ICD-10-CM | POA: Diagnosis not present

## 2020-02-09 DIAGNOSIS — I132 Hypertensive heart and chronic kidney disease with heart failure and with stage 5 chronic kidney disease, or end stage renal disease: Secondary | ICD-10-CM | POA: Diagnosis not present

## 2020-02-09 DIAGNOSIS — D490 Neoplasm of unspecified behavior of digestive system: Secondary | ICD-10-CM | POA: Diagnosis not present

## 2020-02-09 DIAGNOSIS — R109 Unspecified abdominal pain: Secondary | ICD-10-CM | POA: Diagnosis not present

## 2020-02-09 DIAGNOSIS — R0902 Hypoxemia: Secondary | ICD-10-CM | POA: Diagnosis not present

## 2020-02-09 DIAGNOSIS — E1122 Type 2 diabetes mellitus with diabetic chronic kidney disease: Secondary | ICD-10-CM | POA: Diagnosis not present

## 2020-02-09 DIAGNOSIS — N1832 Chronic kidney disease, stage 3b: Secondary | ICD-10-CM | POA: Diagnosis not present

## 2020-02-09 DIAGNOSIS — T82838A Hemorrhage of vascular prosthetic devices, implants and grafts, initial encounter: Secondary | ICD-10-CM | POA: Diagnosis not present

## 2020-02-09 DIAGNOSIS — K922 Gastrointestinal hemorrhage, unspecified: Secondary | ICD-10-CM | POA: Diagnosis not present

## 2020-02-09 DIAGNOSIS — Z452 Encounter for adjustment and management of vascular access device: Secondary | ICD-10-CM | POA: Diagnosis not present

## 2020-02-09 DIAGNOSIS — K831 Obstruction of bile duct: Secondary | ICD-10-CM | POA: Diagnosis not present

## 2020-02-09 DIAGNOSIS — J189 Pneumonia, unspecified organism: Secondary | ICD-10-CM | POA: Diagnosis not present

## 2020-02-09 DIAGNOSIS — J9811 Atelectasis: Secondary | ICD-10-CM | POA: Diagnosis not present

## 2020-02-09 DIAGNOSIS — N170 Acute kidney failure with tubular necrosis: Secondary | ICD-10-CM | POA: Diagnosis not present

## 2020-02-09 DIAGNOSIS — J90 Pleural effusion, not elsewhere classified: Secondary | ICD-10-CM | POA: Diagnosis not present

## 2020-02-09 DIAGNOSIS — R279 Unspecified lack of coordination: Secondary | ICD-10-CM | POA: Diagnosis not present

## 2020-02-09 DIAGNOSIS — J440 Chronic obstructive pulmonary disease with acute lower respiratory infection: Secondary | ICD-10-CM | POA: Diagnosis not present

## 2020-02-09 DIAGNOSIS — R918 Other nonspecific abnormal finding of lung field: Secondary | ICD-10-CM | POA: Diagnosis not present

## 2020-02-09 DIAGNOSIS — N183 Chronic kidney disease, stage 3 unspecified: Secondary | ICD-10-CM | POA: Diagnosis not present

## 2020-02-09 DIAGNOSIS — C801 Malignant (primary) neoplasm, unspecified: Secondary | ICD-10-CM | POA: Diagnosis not present

## 2020-02-09 DIAGNOSIS — R748 Abnormal levels of other serum enzymes: Secondary | ICD-10-CM | POA: Diagnosis not present

## 2020-02-09 DIAGNOSIS — D3A098 Benign carcinoid tumors of other sites: Secondary | ICD-10-CM | POA: Diagnosis not present

## 2020-02-09 DIAGNOSIS — Z743 Need for continuous supervision: Secondary | ICD-10-CM | POA: Diagnosis not present

## 2020-02-09 DIAGNOSIS — I4891 Unspecified atrial fibrillation: Secondary | ICD-10-CM | POA: Diagnosis not present

## 2020-02-09 DIAGNOSIS — N2581 Secondary hyperparathyroidism of renal origin: Secondary | ICD-10-CM | POA: Diagnosis not present

## 2020-02-09 DIAGNOSIS — K851 Biliary acute pancreatitis without necrosis or infection: Secondary | ICD-10-CM | POA: Diagnosis not present

## 2020-02-09 DIAGNOSIS — I4892 Unspecified atrial flutter: Secondary | ICD-10-CM | POA: Diagnosis not present

## 2020-02-09 DIAGNOSIS — D3A8 Other benign neuroendocrine tumors: Secondary | ICD-10-CM | POA: Diagnosis not present

## 2020-02-09 DIAGNOSIS — K859 Acute pancreatitis without necrosis or infection, unspecified: Secondary | ICD-10-CM | POA: Diagnosis not present

## 2020-02-09 DIAGNOSIS — K8689 Other specified diseases of pancreas: Secondary | ICD-10-CM | POA: Diagnosis not present

## 2020-02-09 DIAGNOSIS — N179 Acute kidney failure, unspecified: Secondary | ICD-10-CM | POA: Diagnosis not present

## 2020-02-09 DIAGNOSIS — E861 Hypovolemia: Secondary | ICD-10-CM | POA: Diagnosis not present

## 2020-02-09 DIAGNOSIS — E440 Moderate protein-calorie malnutrition: Secondary | ICD-10-CM | POA: Diagnosis not present

## 2020-02-09 DIAGNOSIS — C7889 Secondary malignant neoplasm of other digestive organs: Secondary | ICD-10-CM | POA: Diagnosis not present

## 2020-02-09 DIAGNOSIS — J811 Chronic pulmonary edema: Secondary | ICD-10-CM | POA: Diagnosis not present

## 2020-02-09 DIAGNOSIS — A419 Sepsis, unspecified organism: Secondary | ICD-10-CM | POA: Diagnosis not present

## 2020-02-09 DIAGNOSIS — E872 Acidosis: Secondary | ICD-10-CM | POA: Diagnosis not present

## 2020-02-09 DIAGNOSIS — R6521 Severe sepsis with septic shock: Secondary | ICD-10-CM | POA: Diagnosis not present

## 2020-02-09 DIAGNOSIS — Z992 Dependence on renal dialysis: Secondary | ICD-10-CM | POA: Diagnosis not present

## 2020-02-09 DIAGNOSIS — R509 Fever, unspecified: Secondary | ICD-10-CM | POA: Diagnosis not present

## 2020-02-09 DIAGNOSIS — I129 Hypertensive chronic kidney disease with stage 1 through stage 4 chronic kidney disease, or unspecified chronic kidney disease: Secondary | ICD-10-CM | POA: Diagnosis not present

## 2020-02-09 DIAGNOSIS — I5032 Chronic diastolic (congestive) heart failure: Secondary | ICD-10-CM | POA: Diagnosis not present

## 2020-02-09 DIAGNOSIS — R1312 Dysphagia, oropharyngeal phase: Secondary | ICD-10-CM | POA: Diagnosis not present

## 2020-02-09 DIAGNOSIS — R000 Tachycardia, unspecified: Secondary | ICD-10-CM | POA: Diagnosis not present

## 2020-02-09 DIAGNOSIS — K91840 Postprocedural hemorrhage and hematoma of a digestive system organ or structure following a digestive system procedure: Secondary | ICD-10-CM | POA: Diagnosis not present

## 2020-02-09 DIAGNOSIS — I9589 Other hypotension: Secondary | ICD-10-CM | POA: Diagnosis not present

## 2020-02-09 DIAGNOSIS — I10 Essential (primary) hypertension: Secondary | ICD-10-CM | POA: Diagnosis not present

## 2020-02-09 DIAGNOSIS — E538 Deficiency of other specified B group vitamins: Secondary | ICD-10-CM | POA: Diagnosis not present

## 2020-02-09 DIAGNOSIS — R7401 Elevation of levels of liver transaminase levels: Secondary | ICD-10-CM | POA: Diagnosis not present

## 2020-02-09 DIAGNOSIS — R1110 Vomiting, unspecified: Secondary | ICD-10-CM | POA: Diagnosis not present

## 2020-02-09 DIAGNOSIS — K861 Other chronic pancreatitis: Secondary | ICD-10-CM | POA: Diagnosis not present

## 2020-02-09 DIAGNOSIS — Z794 Long term (current) use of insulin: Secondary | ICD-10-CM | POA: Diagnosis not present

## 2020-02-09 LAB — RESPIRATORY PANEL BY RT PCR (FLU A&B, COVID)
Influenza A by PCR: NEGATIVE
Influenza B by PCR: NEGATIVE
SARS Coronavirus 2 by RT PCR: NEGATIVE

## 2020-02-09 MED ORDER — ROSUVASTATIN CALCIUM 10 MG PO TABS
10.0000 mg | ORAL_TABLET | Freq: Every day | ORAL | Status: DC
  Filled 2020-02-09: qty 1

## 2020-02-09 MED ORDER — PIOGLITAZONE HCL 15 MG PO TABS: 15.0000 mg | ORAL_TABLET | Freq: Every day | ORAL | Status: DC

## 2020-02-09 MED ORDER — LEVOTHYROXINE SODIUM 50 MCG PO TABS
50.0000 ug | ORAL_TABLET | Freq: Every day | ORAL | Status: DC
  Administered 2020-02-09: 50 ug via ORAL
  Filled 2020-02-09: qty 1

## 2020-02-09 MED ORDER — PANTOPRAZOLE SODIUM 40 MG PO TBEC
40.0000 mg | DELAYED_RELEASE_TABLET | Freq: Every day | ORAL | Status: DC
  Administered 2020-02-09: 40 mg via ORAL
  Filled 2020-02-09: qty 1

## 2020-02-09 MED ORDER — APIXABAN 5 MG PO TABS
5.0000 mg | ORAL_TABLET | Freq: Two times a day (BID) | ORAL | Status: DC
  Administered 2020-02-09 (×2): 5 mg via ORAL
  Filled 2020-02-09 (×2): qty 1

## 2020-02-09 MED ORDER — FLUTICASONE FUROATE-VILANTEROL 100-25 MCG/INH IN AEPB
1.0000 | INHALATION_SPRAY | Freq: Every day | RESPIRATORY_TRACT | Status: DC
  Administered 2020-02-09: 1 via RESPIRATORY_TRACT
  Filled 2020-02-09: qty 28

## 2020-02-09 MED ORDER — VITAMIN B-12 1000 MCG PO TABS
500.0000 ug | ORAL_TABLET | Freq: Every day | ORAL | Status: DC
  Administered 2020-02-09: 500 ug via ORAL
  Filled 2020-02-09: qty 0.5

## 2020-02-09 MED ORDER — ALBUTEROL SULFATE (2.5 MG/3ML) 0.083% IN NEBU: 2.5000 mg | INHALATION_SOLUTION | RESPIRATORY_TRACT | Status: DC | PRN

## 2020-02-09 MED ORDER — DILTIAZEM HCL ER COATED BEADS 120 MG PO CP24
120.0000 mg | ORAL_CAPSULE | Freq: Two times a day (BID) | ORAL | Status: DC
  Administered 2020-02-09: 120 mg via ORAL
  Filled 2020-02-09 (×2): qty 1

## 2020-02-09 MED ORDER — INSULIN GLARGINE 100 UNIT/ML ~~LOC~~ SOLN
14.0000 [IU] | Freq: Every day | SUBCUTANEOUS | Status: DC
  Filled 2020-02-09: qty 0.14

## 2020-02-09 NOTE — ED Notes (Signed)
emtala reviewed by this RN 

## 2020-02-09 NOTE — ED Notes (Signed)
[place] to check on status [facility] at capacity patient waiting for bed  1424

## 2020-02-09 NOTE — ED Notes (Signed)
[facility] transport center called for pt update.

## 2020-02-09 NOTE — ED Notes (Signed)
Pt's temp dialysis catheter bleeding at the site. Clot formed. Dressing changed and bleeding controlled. Evaluated by Dr. Corky Downs.

## 2020-02-09 NOTE — Progress Notes (Signed)
[name]  MRN: [id number]  DOB/AGE: [birth year] 74 y.o.  Primary Care Physician:Masoud, Viann Shove, MD  Admit date: 02/08/2020  Chief Complaint:  Chief Complaint  Patient presents with  . Hypotension    S-Pt presented on  02/08/2020 with  Chief Complaint  Patient presents with  . Hypotension  .    Pt today feels better. Pt tolerated HD well.  Patient main query was when will he be transferred to [facility]      Medications . apixaban  5 mg Oral BID  . Chlorhexidine Gluconate Cloth  6 each Topical Q0600  . diltiazem  120 mg Oral BID  . fluticasone furoate-vilanterol  1 puff Inhalation Daily  . insulin glargine  14 Units Subcutaneous QHS  . levothyroxine  50 mcg Oral Daily  . pantoprazole  40 mg Oral Daily  . rosuvastatin  10 mg Oral Daily  . vitamin B-12  500 mcg Oral Daily         UMP:NTIRW from the symptoms mentioned above,there are no other symptoms referable to all systems reviewed.  Physical Exam: Vital signs in last 24 hours: Temp:  [97.5 F (36.4 C)-98.1 F (36.7 C)] 98.1 F (36.7 C) (03/27 0827) Pulse Rate:  [25-131] 120 (03/27 1044) Resp:  [12-26] 18 (03/27 1044) BP: (90-123)/(49-82) 110/63 (03/27 1044) SpO2:  [95 %-100 %] 95 % (03/27 1044) Weight:  [72.1 kg] 72.1 kg (03/26 1626) Weight change:     Intake/Output from previous day: 03/26 0701 - 03/27 0700 In: 1050 [I.V.:1000; IV Piggyback:50] Out: 0  No intake/output data recorded.   Physical Exam: General- pt is awake,alert, oriented to time place and person Resp- No acute REsp distress, CTA B/L NO Rhonchi CVS- S1S2 regular in rate and rhythm GIT- BS+, soft, NT, ND EXT- NO LE Edema, Cyanosis   Lab Results: CBC Recent Labs    02/08/20 1015  WBC 7.3  HGB 14.8  HCT 44.6  PLT 121*    BMET Recent Labs    02/08/20 1015  NA 133*  K 4.6  CL 99  CO2 19*  GLUCOSE 269*  BUN 88*  CREATININE 10.64*  CALCIUM 7.9*    Creatinine trend 2021 10.6 2020 1.8--2.7 2019 2.0--2.5 2018  1.6--2.1 2017 1.9--2.3   MICRO Recent Results (from the past 240 hour(s))  Respiratory Panel by RT PCR (Flu A&B, Covid) - Nasopharyngeal Swab     Status: None   Collection Time: 02/09/20  7:41 AM   Specimen: Nasopharyngeal Swab  Result Value Ref Range Status   SARS Coronavirus 2 by RT PCR NEGATIVE NEGATIVE Final    Comment: (NOTE) SARS-CoV-2 target nucleic acids are NOT DETECTED. The SARS-CoV-2 RNA is generally detectable in upper respiratoy specimens during the acute phase of infection. The lowest concentration of SARS-CoV-2 viral copies this assay can detect is 131 copies/mL. A negative result does not preclude SARS-Cov-2 infection and should not be used as the sole basis for treatment or other patient management decisions. A negative result may occur with  improper specimen collection/handling, submission of specimen other than nasopharyngeal swab, presence of viral mutation(s) within the areas targeted by this assay, and inadequate number of viral copies (<131 copies/mL). A negative result must be combined with clinical observations, patient history, and epidemiological information. The expected result is Negative. Fact Sheet for Patients:  PinkCheek.be Fact Sheet for Healthcare Providers:  GravelBags.it This test is not yet ap proved or cleared by the [place] FDA and  has been authorized for detection and/or diagnosis of  SARS-CoV-2 by FDA under an Emergency Use Authorization (EUA). This EUA will remain  in effect (meaning this test can be used) for the duration of the COVID-19 declaration under Section 564(b)(1) of the Act, 21 U.S.C. section 360bbb-3(b)(1), unless the authorization is terminated or revoked sooner.    Influenza A by PCR NEGATIVE NEGATIVE Final   Influenza B by PCR NEGATIVE NEGATIVE Final    Comment: (NOTE) The Xpert Xpress SARS-CoV-2/FLU/RSV assay is intended as an aid in  the diagnosis of  influenza from Nasopharyngeal swab specimens and  should not be used as a sole basis for treatment. Nasal washings and  aspirates are unacceptable for Xpert Xpress SARS-CoV-2/FLU/RSV  testing. Fact Sheet for Patients: PinkCheek.be Fact Sheet for Healthcare Providers: GravelBags.it This test is not yet approved or cleared by the [place] FDA and  has been authorized for detection and/or diagnosis of SARS-CoV-2 by  FDA under an Emergency Use Authorization (EUA). This EUA will remain  in effect (meaning this test can be used) for the duration of the  Covid-19 declaration under Section 564(b)(1) of the Act, 21  U.S.C. section 360bbb-3(b)(1), unless the authorization is  terminated or revoked. Performed at [facility], [address] [place], [place] [address]       Lab Results  Component Value Date   CALCIUM 7.9 (L) 02/08/2020   PHOS 6.9 (H) 02/08/2020               Impression:   Pt is a 74 y.o. [place] with past medical history of atrial flutter, coronary disease, carcinoid tumor,  CKD, COPD, diabetes, insulin-dependent, heart failure with preserved ejection fraction, hypertension, pancreatitis, was admitted on 02/08/2020 with Intermittent abdominal pain,Elevated LFTs,Vomiting ,Elevated bilirubin  Hypotension, Acute renal failure and acute pancreatitis  1)Renal  AKI secondary to ATN Patient has ATN secondary to hypotension/hypovolemia/ARB on board /NSAIDs Patient has AKI on CKD Patient has CKD stage IV Patient has CKD since 2017 Patient had temporary catheter placed. Patient will be dialyzed today   2)HTN Blood pressure is stable  3)Anemia none   4) secondary hyperparathyroidism -CKD Mineral-Bone Disorder   Secondary Hyperparathyroidism present  Phosphorus is not at goal. Dialysis should help  5) pancreatitis Patient needs ERCP Patient is awaiting transfer to [facility]  6)  electrolytes   sodium Hyponatremia This is most likely from AKI on CKD This most likely from inability to get rid of free water   potassium Normokalemic    7)Acid base Co2 not at goal  Patient has known nongap acidosis from AKI on CKD. Dialysis should help   Plan:  We will continue current treatment     [name] s [facility] 02/09/2020, 11:29 AM

## 2020-02-11 ENCOUNTER — Encounter: Payer: Self-pay | Admitting: Cardiology

## 2020-03-20 DIAGNOSIS — C801 Malignant (primary) neoplasm, unspecified: Secondary | ICD-10-CM | POA: Diagnosis not present

## 2020-03-20 DIAGNOSIS — N170 Acute kidney failure with tubular necrosis: Secondary | ICD-10-CM | POA: Diagnosis not present

## 2020-03-20 DIAGNOSIS — E440 Moderate protein-calorie malnutrition: Secondary | ICD-10-CM | POA: Diagnosis not present

## 2020-03-20 DIAGNOSIS — K831 Obstruction of bile duct: Secondary | ICD-10-CM | POA: Diagnosis not present

## 2020-03-21 ENCOUNTER — Other Ambulatory Visit: Payer: Self-pay

## 2020-03-21 ENCOUNTER — Encounter: Payer: Self-pay | Admitting: Internal Medicine

## 2020-03-21 ENCOUNTER — Ambulatory Visit (INDEPENDENT_AMBULATORY_CARE_PROVIDER_SITE_OTHER): Payer: Medicare Other | Admitting: Internal Medicine

## 2020-03-21 ENCOUNTER — Encounter: Payer: Self-pay | Admitting: Cardiology

## 2020-03-21 VITALS — BP 98/54 | HR 78 | Wt 144.0 lb

## 2020-03-21 DIAGNOSIS — D696 Thrombocytopenia, unspecified: Secondary | ICD-10-CM

## 2020-03-21 DIAGNOSIS — J449 Chronic obstructive pulmonary disease, unspecified: Secondary | ICD-10-CM | POA: Diagnosis not present

## 2020-03-21 DIAGNOSIS — J9611 Chronic respiratory failure with hypoxia: Secondary | ICD-10-CM

## 2020-03-21 DIAGNOSIS — I25118 Atherosclerotic heart disease of native coronary artery with other forms of angina pectoris: Secondary | ICD-10-CM

## 2020-03-21 DIAGNOSIS — I129 Hypertensive chronic kidney disease with stage 1 through stage 4 chronic kidney disease, or unspecified chronic kidney disease: Secondary | ICD-10-CM | POA: Diagnosis not present

## 2020-03-21 DIAGNOSIS — E1165 Type 2 diabetes mellitus with hyperglycemia: Secondary | ICD-10-CM

## 2020-03-21 DIAGNOSIS — E119 Type 2 diabetes mellitus without complications: Secondary | ICD-10-CM | POA: Diagnosis not present

## 2020-03-21 DIAGNOSIS — I4892 Unspecified atrial flutter: Secondary | ICD-10-CM | POA: Diagnosis not present

## 2020-03-21 DIAGNOSIS — I2510 Atherosclerotic heart disease of native coronary artery without angina pectoris: Secondary | ICD-10-CM | POA: Diagnosis not present

## 2020-03-21 DIAGNOSIS — Z85068 Personal history of other malignant neoplasm of small intestine: Secondary | ICD-10-CM | POA: Diagnosis not present

## 2020-03-21 DIAGNOSIS — K219 Gastro-esophageal reflux disease without esophagitis: Secondary | ICD-10-CM | POA: Diagnosis not present

## 2020-03-21 DIAGNOSIS — N183 Chronic kidney disease, stage 3 unspecified: Secondary | ICD-10-CM | POA: Diagnosis not present

## 2020-03-21 DIAGNOSIS — K831 Obstruction of bile duct: Secondary | ICD-10-CM | POA: Diagnosis not present

## 2020-03-21 DIAGNOSIS — C7B04 Secondary carcinoid tumors of peritoneum: Secondary | ICD-10-CM | POA: Diagnosis not present

## 2020-03-21 DIAGNOSIS — I700 Atherosclerosis of aorta: Secondary | ICD-10-CM

## 2020-03-21 DIAGNOSIS — N2581 Secondary hyperparathyroidism of renal origin: Secondary | ICD-10-CM

## 2020-03-21 DIAGNOSIS — E785 Hyperlipidemia, unspecified: Secondary | ICD-10-CM | POA: Diagnosis not present

## 2020-03-21 DIAGNOSIS — I483 Typical atrial flutter: Secondary | ICD-10-CM

## 2020-03-21 DIAGNOSIS — E440 Moderate protein-calorie malnutrition: Secondary | ICD-10-CM | POA: Diagnosis not present

## 2020-03-21 DIAGNOSIS — E538 Deficiency of other specified B group vitamins: Secondary | ICD-10-CM | POA: Diagnosis not present

## 2020-03-21 DIAGNOSIS — D500 Iron deficiency anemia secondary to blood loss (chronic): Secondary | ICD-10-CM | POA: Diagnosis not present

## 2020-03-21 DIAGNOSIS — R1312 Dysphagia, oropharyngeal phase: Secondary | ICD-10-CM | POA: Diagnosis not present

## 2020-03-21 DIAGNOSIS — Z8701 Personal history of pneumonia (recurrent): Secondary | ICD-10-CM | POA: Diagnosis not present

## 2020-03-21 DIAGNOSIS — Z794 Long term (current) use of insulin: Secondary | ICD-10-CM

## 2020-03-21 DIAGNOSIS — K861 Other chronic pancreatitis: Secondary | ICD-10-CM | POA: Diagnosis not present

## 2020-03-21 DIAGNOSIS — E1122 Type 2 diabetes mellitus with diabetic chronic kidney disease: Secondary | ICD-10-CM | POA: Diagnosis not present

## 2020-03-21 LAB — GLUCOSE, POCT (MANUAL RESULT ENTRY): POC Glucose: 142 mg/dl — AB (ref 70–99)

## 2020-03-21 MED ORDER — ROSUVASTATIN CALCIUM 10 MG PO TABS
10.00 | ORAL_TABLET | ORAL | Status: DC
Start: 2020-03-19 — End: 2020-03-21

## 2020-03-21 MED ORDER — GLUCAGON (RDNA) 1 MG IJ KIT
1.00 | PACK | INTRAMUSCULAR | Status: DC
Start: ? — End: 2020-03-21

## 2020-03-21 MED ORDER — BUDESONIDE-FORMOTEROL FUMARATE 80-4.5 MCG/ACT IN AERO
2.00 | INHALATION_SPRAY | RESPIRATORY_TRACT | Status: DC
Start: 2020-03-19 — End: 2020-03-21

## 2020-03-21 MED ORDER — IPRATROPIUM-ALBUTEROL 0.5-2.5 (3) MG/3ML IN SOLN
3.00 | RESPIRATORY_TRACT | Status: DC
Start: ? — End: 2020-03-21

## 2020-03-21 MED ORDER — APIXABAN 2.5 MG PO TABS
2.50 | ORAL_TABLET | ORAL | Status: DC
Start: 2020-03-19 — End: 2020-03-21

## 2020-03-21 MED ORDER — PANTOPRAZOLE SODIUM 40 MG PO TBEC
40.00 | DELAYED_RELEASE_TABLET | ORAL | Status: DC
Start: 2020-03-20 — End: 2020-03-21

## 2020-03-21 MED ORDER — INSULIN LISPRO 100 UNIT/ML ~~LOC~~ SOLN
0.00 | SUBCUTANEOUS | Status: DC
Start: 2020-03-19 — End: 2020-03-21

## 2020-03-21 MED ORDER — AMIODARONE HCL 200 MG PO TABS
200.00 | ORAL_TABLET | ORAL | Status: DC
Start: 2020-03-20 — End: 2020-03-21

## 2020-03-21 MED ORDER — EPOETIN ALFA-EPBX 4000 UNIT/ML IJ SOLN
4000.00 | INTRAMUSCULAR | Status: DC
Start: 2020-03-21 — End: 2020-03-21

## 2020-03-21 MED ORDER — MELATONIN 3 MG PO TABS
3.00 | ORAL_TABLET | ORAL | Status: DC
Start: ? — End: 2020-03-21

## 2020-03-21 MED ORDER — CALCIUM CARBONATE-VITAMIN D 600-400 MG-UNIT PO TABS
1.00 | ORAL_TABLET | ORAL | Status: DC
Start: 2020-03-20 — End: 2020-03-21

## 2020-03-21 MED ORDER — SENNOSIDES-DOCUSATE SODIUM 8.6-50 MG PO TABS
2.00 | ORAL_TABLET | ORAL | Status: DC
Start: 2020-03-19 — End: 2020-03-21

## 2020-03-21 MED ORDER — DEXTROSE 50 % IV SOLN
12.50 | INTRAVENOUS | Status: DC
Start: ? — End: 2020-03-21

## 2020-03-21 MED ORDER — INSULIN GLARGINE 100 UNIT/ML ~~LOC~~ SOLN
5.00 | SUBCUTANEOUS | Status: DC
Start: 2020-03-19 — End: 2020-03-21

## 2020-03-21 MED ORDER — METOPROLOL TARTRATE 25 MG PO TABS
12.50 | ORAL_TABLET | ORAL | Status: DC
Start: 2020-03-19 — End: 2020-03-21

## 2020-03-21 MED ORDER — LEVOTHYROXINE SODIUM 50 MCG PO TABS
50.00 | ORAL_TABLET | ORAL | Status: DC
Start: 2020-03-20 — End: 2020-03-21

## 2020-03-21 MED ORDER — ACETAMINOPHEN 325 MG PO TABS
650.00 | ORAL_TABLET | ORAL | Status: DC
Start: ? — End: 2020-03-21

## 2020-03-21 MED ORDER — MIDODRINE HCL 5 MG PO TABS
10.00 | ORAL_TABLET | ORAL | Status: DC
Start: 2020-03-19 — End: 2020-03-21

## 2020-03-21 MED ORDER — HEPARIN SODIUM (PORCINE) 1000 UNIT/ML IJ SOLN
INTRAMUSCULAR | Status: DC
Start: ? — End: 2020-03-21

## 2020-03-21 NOTE — Patient Instructions (Signed)
Nurse come to house once a wk/ he was given a note  So he can postpone  His court appearance  For  Traffic  violation

## 2020-03-21 NOTE — Progress Notes (Addendum)
Established Patient Office Visit  Subjective:  [name], male    DOB: 05/12/1946  Age: 74 y.o. MRN: [id number]  CC:  Chief Complaint  Patient presents with  . Transitions Of Care    Patient was admitted to [facility] for low blood pressure     HPI  [name] presents for follow-up from [facility].  He was admitted at [facility] with a hypotension transferred from [facility] to [facility] by ambulance.  He is also known to have type 2 diabetes mellitus dyslipidemia he was in shock due to systemic infection has elevated bilirubin common bile duct dilatation has acute renal failure and is on dialysis.  Patient has oropharyngeal dysphagia  Past Medical History:  Diagnosis Date  . Aortic atherosclerosis ([place])    As seen on 04/2019 CT  . Atrial flutter ([place])    a. s/p ablation 2017/2018 and DCCV 2015/2016/2019 b. 2017 echo EF 60-65%, no RWMA, mild MR,  . CAD (coronary artery disease)    3v CAD 04/2019 CT  . Carcinoid tumor    metastatic  . CKD (chronic kidney disease), stage III   . COPD (chronic obstructive pulmonary disease) (HCC)    PM oxygen  . Current occasional smoker    1-2 cigarettes / week  . Diabetes mellitus type 2, insulin dependent ([place])   . Heart failure with preserved ejection fraction ([place])    2017 echo EF 60-65%, no RWMA, mild MR,    . Hyperlipidemia   . Hypertension   . Pancreatitis   . Polycythemia     Past Surgical History:  Procedure Laterality Date  . A-FLUTTER ABLATION N/A 10/14/2017   Procedure: A-FLUTTER ABLATION;  Surgeon: Constance Haw, MD;  Location: [place] CV LAB;  Service: Cardiovascular;  Laterality: N/A;  . CARDIOVERSION N/A 09/08/2017   Procedure: CARDIOVERSION;  Surgeon: [name], MD;  Location: [facility] ORS;  Service: Cardiovascular;  Laterality: N/A;  . CARDIOVERSION N/A 03/22/2018   Procedure: CARDIOVERSION;  Surgeon: [name], MD;  Location: [facility] ORS;  Service: Cardiovascular;  Laterality: N/A;  .  COLONOSCOPY    . COLONOSCOPY WITH PROPOFOL N/A 10/31/2019   Procedure: COLONOSCOPY WITH PROPOFOL;  Surgeon: Lin Landsman, MD;  Location: [facility] ENDOSCOPY;  Service: Gastroenterology;  Laterality: N/A;  . ELECTROPHYSIOLOGIC STUDY N/A 10/27/2016   Procedure: A-Flutter Ablation;  Surgeon: Deboraha Sprang, MD;  Location: [place] CV LAB;  Service: Cardiovascular;  Laterality: N/A;  . EUS N/A 12/22/2017   Procedure: FULL UPPER ENDOSCOPIC ULTRASOUND (EUS) RADIAL;  Surgeon: Reita Cliche, MD;  Location: [facility] ENDOSCOPY;  Service: Gastroenterology;  Laterality: N/A;  . TEE WITH CARDIOVERSION  12/16  . TEMPORARY DIALYSIS CATHETER N/A 02/08/2020   Procedure: TEMPORARY DIALYSIS CATHETER;  Surgeon: Algernon Huxley, MD;  Location: [place] CV LAB;  Service: Cardiovascular;  Laterality: N/A;    Family History  Problem Relation Age of Onset  . Hypertension Father     Social History   Socioeconomic History  . Marital status: Widowed    Spouse name: Not on file  . Number of children: Not on file  . Years of education: Not on file  . Highest education level: Not on file  Occupational History  . Not on file  Tobacco Use  . Smoking status: Light Tobacco Smoker    Years: 45.00    Types: Cigarettes    Last attempt to quit: 11/27/2012    Years since quitting: 7.4  . Smokeless tobacco: Never Used  Vaping Use  . Vaping Use: Never used  Substance and Sexual Activity  . Alcohol use: Yes    Alcohol/week: 2.0 standard drinks    Types: 2 Cans of beer per week    Comment: beer every once a while once a monthnone last 24hrs  . Drug use: No  . Sexual activity: Not on file  Other Topics Concern  . Not on file  Social History Narrative  . Not on file   Social Determinants of Health   Financial Resource Strain:   . Difficulty of Paying Living Expenses:   Food Insecurity:   . Worried About Charity fundraiser in the Last Year:   . Arboriculturist in the Last Year:   Transportation Needs:     . Film/video editor (Medical):   Marland Kitchen Lack of Transportation (Non-Medical):   Physical Activity:   . Days of Exercise per Week:   . Minutes of Exercise per Session:   Stress:   . Feeling of Stress :   Social Connections:   . Frequency of Communication with Friends and Family:   . Frequency of Social Gatherings with Friends and Family:   . Attends Religious Services:   . Active Member of Clubs or Organizations:   . Attends Archivist Meetings:   Marland Kitchen Marital Status:   Intimate Partner Violence:   . Fear of Current or Ex-Partner:   . Emotionally Abused:   Marland Kitchen Physically Abused:   . Sexually Abused:      Current Outpatient Medications:  .  acetaminophen (TYLENOL) 500 MG tablet, Take 500-1,000 mg by mouth every 6 (six) hours as needed for moderate pain or headache. , Disp: , Rfl:  .  albuterol (PROAIR HFA) 108 (90 Base) MCG/ACT inhaler, Inhale 2 puffs into the lungs every 4 (four) hours as needed for wheezing or shortness of breath. , Disp: , Rfl:  .  budesonide-formoterol (SYMBICORT) 80-4.5 MCG/ACT inhaler, Inhale 2 puffs into the lungs 2 (two) times daily. Takes at noon and at 4pm., Disp: , Rfl:  .  Insulin Glargine (LANTUS SOLOSTAR) 100 UNIT/ML Solostar Pen, Inject 5 Units into the skin at bedtime. , Disp: , Rfl:  .  vitamin B-12 (CYANOCOBALAMIN) 500 MCG tablet, Take 500 mcg by mouth daily. , Disp: , Rfl:  .  amiodarone (PACERONE) 200 MG tablet, Take 1 tablet (200 mg total) by mouth daily., Disp: 90 tablet, Rfl: 3 .  apixaban (ELIQUIS) 2.5 MG TABS tablet, Take 1 tablet (2.5 mg total) by mouth 3 (three) times daily., Disp: 90 tablet, Rfl: 6 .  EUTHYROX 25 MCG tablet, Take 1 tablet (25 mcg total) by mouth every morning., Disp: 90 tablet, Rfl: 3 .  levothyroxine (SYNTHROID, LEVOTHROID) 50 MCG tablet, Take 25 mcg by mouth daily. , Disp: , Rfl:  .  metoprolol succinate (TOPROL-XL) 50 MG 24 hr tablet, TAKE 1 TABLET BY MOUTH IN THE EVENING. TAKE WITH OR IMMEDIATELY FOLLOWING A MEAL,  Disp: 90 tablet, Rfl: 0 .  midodrine (PROAMATINE) 10 MG tablet, Take 1 tablet (10 mg total) by mouth 3 (three) times daily., Disp: 90 tablet, Rfl: 6 .  omeprazole (PRILOSEC) 20 MG capsule, Take 1 capsule by mouth once daily, Disp: 60 capsule, Rfl: 0 .  rosuvastatin (CRESTOR) 40 MG tablet, Take 1 tablet (40 mg total) by mouth daily., Disp: 90 tablet, Rfl: 3   Allergies  Allergen Reactions  . Levaquin [Levofloxacin In D5w] Swelling and Rash    ROS Review of Systems  Constitutional: Positive for fatigue. Negative for appetite change, chills and fever.  HENT: Positive for nosebleeds. Negative for sinus pain.   Eyes: Negative.   Respiratory: Negative for cough, choking and chest tightness.   Cardiovascular: Negative for leg swelling.  Gastrointestinal: Negative for abdominal pain.  Genitourinary: Negative for dysuria.  Musculoskeletal: Negative for arthralgias.  Neurological: Positive for weakness.  Psychiatric/Behavioral: The patient is not nervous/anxious.       Objective:    Physical Exam  HENT:  Head: Normocephalic.  Eyes: Pupils are equal, round, and reactive to light. No scleral icterus.  Neck: No JVD present. No tracheal deviation present. No thyromegaly present.  Cardiovascular:  No murmur heard. Irregular ht beat  Musculoskeletal:        General: No edema.     Cervical back: Neck supple.  Lymphadenopathy:    He has no cervical adenopathy.  Neurological: No cranial nerve deficit.    BP (!) 98/54   Pulse 78   Wt 144 lb (65.3 kg)   BMI 22.55 kg/m  Wt Readings from Last 3 Encounters:  04/21/20 146 lb 8 oz (66.5 kg)  03/21/20 144 lb (65.3 kg)  02/08/20 158 lb 15.2 oz (72.1 kg)     Health Maintenance Due  Topic Date Due  . Hepatitis C Screening  Never done  . FOOT EXAM  Never done  . OPHTHALMOLOGY EXAM  Never done  . URINE MICROALBUMIN  Never done  . COVID-19 Vaccine (1) Never done  . TETANUS/TDAP  Never done  . HEMOGLOBIN A1C  05/01/2015    There are  no preventive care reminders to display for this patient.  Lab Results  Component Value Date   TSH 2.800 11/25/2016   Lab Results  Component Value Date   WBC 7.3 02/08/2020   HGB 14.8 02/08/2020   HCT 44.6 02/08/2020   MCV 91.2 02/08/2020   PLT 121 (L) 02/08/2020   Lab Results  Component Value Date   NA 133 (L) 02/08/2020   K 4.6 02/08/2020   CO2 19 (L) 02/08/2020   GLUCOSE 269 (H) 02/08/2020   BUN 88 (H) 02/08/2020   CREATININE 10.64 (H) 02/08/2020   BILITOT 8.9 (H) 02/08/2020   ALKPHOS 730 (H) 02/08/2020   AST 76 (H) 02/08/2020   ALT 125 (H) 02/08/2020   PROT 6.6 02/08/2020   ALBUMIN 3.3 (L) 02/08/2020   CALCIUM 7.9 (L) 02/08/2020   ANIONGAP 15 02/08/2020   Lab Results  Component Value Date   CHOL 79 11/26/2018   Lab Results  Component Value Date   HDL 32 (L) 11/26/2018   Lab Results  Component Value Date   LDLCALC 27 11/26/2018   Lab Results  Component Value Date   TRIG 98 11/26/2018   Lab Results  Component Value Date   CHOLHDL 2.5 11/26/2018   Lab Results  Component Value Date   HGBA1C 7.0 (H) 10/30/2014      Assessment & Plan:   Problem List Items Addressed This Visit      Cardiovascular and Mediastinum   Aortic atherosclerosis ([place])    Patient has quit smoking and he is following low-cholesterol diet      Coronary artery disease of native artery of native heart with stable angina pectoris [facility])    Patient is not having any angina pectoris right now        Respiratory   Chronic obstructive pulmonary disease ([place])    Presently not a smoker      Chronic respiratory failure with  hypoxia [facility])    Patient is doing well on oxygen         Endocrine   Type 2 diabetes mellitus ([place]) - Primary   Relevant Orders   POCT glucose (manual entry) (Completed)     Other   Thrombocytopenia, unspecified (HCC)    No evidence of epistaxis or bleeding.  Patient has some bruising on the extremities.         No orders of the defined types  were placed in this encounter.   Follow-up: 62month    [name], MD

## 2020-03-25 DIAGNOSIS — Z8701 Personal history of pneumonia (recurrent): Secondary | ICD-10-CM | POA: Diagnosis not present

## 2020-03-25 DIAGNOSIS — E538 Deficiency of other specified B group vitamins: Secondary | ICD-10-CM | POA: Diagnosis not present

## 2020-03-25 DIAGNOSIS — K831 Obstruction of bile duct: Secondary | ICD-10-CM | POA: Diagnosis not present

## 2020-03-25 DIAGNOSIS — E440 Moderate protein-calorie malnutrition: Secondary | ICD-10-CM | POA: Diagnosis not present

## 2020-03-25 DIAGNOSIS — J449 Chronic obstructive pulmonary disease, unspecified: Secondary | ICD-10-CM | POA: Diagnosis not present

## 2020-03-25 DIAGNOSIS — D500 Iron deficiency anemia secondary to blood loss (chronic): Secondary | ICD-10-CM | POA: Diagnosis not present

## 2020-03-25 DIAGNOSIS — E1122 Type 2 diabetes mellitus with diabetic chronic kidney disease: Secondary | ICD-10-CM | POA: Diagnosis not present

## 2020-03-25 DIAGNOSIS — I129 Hypertensive chronic kidney disease with stage 1 through stage 4 chronic kidney disease, or unspecified chronic kidney disease: Secondary | ICD-10-CM | POA: Diagnosis not present

## 2020-03-25 DIAGNOSIS — I2510 Atherosclerotic heart disease of native coronary artery without angina pectoris: Secondary | ICD-10-CM | POA: Diagnosis not present

## 2020-03-25 DIAGNOSIS — E785 Hyperlipidemia, unspecified: Secondary | ICD-10-CM | POA: Diagnosis not present

## 2020-03-25 DIAGNOSIS — Z85068 Personal history of other malignant neoplasm of small intestine: Secondary | ICD-10-CM | POA: Diagnosis not present

## 2020-03-25 DIAGNOSIS — K861 Other chronic pancreatitis: Secondary | ICD-10-CM | POA: Diagnosis not present

## 2020-03-25 DIAGNOSIS — K219 Gastro-esophageal reflux disease without esophagitis: Secondary | ICD-10-CM | POA: Diagnosis not present

## 2020-03-25 DIAGNOSIS — I4892 Unspecified atrial flutter: Secondary | ICD-10-CM | POA: Diagnosis not present

## 2020-03-25 DIAGNOSIS — R1312 Dysphagia, oropharyngeal phase: Secondary | ICD-10-CM | POA: Diagnosis not present

## 2020-03-25 DIAGNOSIS — N183 Chronic kidney disease, stage 3 unspecified: Secondary | ICD-10-CM | POA: Diagnosis not present

## 2020-03-28 DIAGNOSIS — E1122 Type 2 diabetes mellitus with diabetic chronic kidney disease: Secondary | ICD-10-CM | POA: Diagnosis not present

## 2020-03-28 DIAGNOSIS — E538 Deficiency of other specified B group vitamins: Secondary | ICD-10-CM | POA: Diagnosis not present

## 2020-03-28 DIAGNOSIS — D500 Iron deficiency anemia secondary to blood loss (chronic): Secondary | ICD-10-CM | POA: Diagnosis not present

## 2020-03-28 DIAGNOSIS — K861 Other chronic pancreatitis: Secondary | ICD-10-CM | POA: Diagnosis not present

## 2020-03-28 DIAGNOSIS — R1312 Dysphagia, oropharyngeal phase: Secondary | ICD-10-CM | POA: Diagnosis not present

## 2020-03-28 DIAGNOSIS — N183 Chronic kidney disease, stage 3 unspecified: Secondary | ICD-10-CM | POA: Diagnosis not present

## 2020-03-28 DIAGNOSIS — Z8701 Personal history of pneumonia (recurrent): Secondary | ICD-10-CM | POA: Diagnosis not present

## 2020-03-28 DIAGNOSIS — K219 Gastro-esophageal reflux disease without esophagitis: Secondary | ICD-10-CM | POA: Diagnosis not present

## 2020-03-28 DIAGNOSIS — I4892 Unspecified atrial flutter: Secondary | ICD-10-CM | POA: Diagnosis not present

## 2020-03-28 DIAGNOSIS — I2510 Atherosclerotic heart disease of native coronary artery without angina pectoris: Secondary | ICD-10-CM | POA: Diagnosis not present

## 2020-03-28 DIAGNOSIS — I129 Hypertensive chronic kidney disease with stage 1 through stage 4 chronic kidney disease, or unspecified chronic kidney disease: Secondary | ICD-10-CM | POA: Diagnosis not present

## 2020-03-28 DIAGNOSIS — J449 Chronic obstructive pulmonary disease, unspecified: Secondary | ICD-10-CM | POA: Diagnosis not present

## 2020-03-28 DIAGNOSIS — E440 Moderate protein-calorie malnutrition: Secondary | ICD-10-CM | POA: Diagnosis not present

## 2020-03-28 DIAGNOSIS — Z85068 Personal history of other malignant neoplasm of small intestine: Secondary | ICD-10-CM | POA: Diagnosis not present

## 2020-03-28 DIAGNOSIS — K831 Obstruction of bile duct: Secondary | ICD-10-CM | POA: Diagnosis not present

## 2020-03-28 DIAGNOSIS — E785 Hyperlipidemia, unspecified: Secondary | ICD-10-CM | POA: Diagnosis not present

## 2020-03-30 DIAGNOSIS — E1122 Type 2 diabetes mellitus with diabetic chronic kidney disease: Secondary | ICD-10-CM | POA: Diagnosis not present

## 2020-03-30 DIAGNOSIS — K219 Gastro-esophageal reflux disease without esophagitis: Secondary | ICD-10-CM | POA: Diagnosis not present

## 2020-03-30 DIAGNOSIS — Z85068 Personal history of other malignant neoplasm of small intestine: Secondary | ICD-10-CM | POA: Diagnosis not present

## 2020-03-30 DIAGNOSIS — E440 Moderate protein-calorie malnutrition: Secondary | ICD-10-CM | POA: Diagnosis not present

## 2020-03-30 DIAGNOSIS — Z8701 Personal history of pneumonia (recurrent): Secondary | ICD-10-CM | POA: Diagnosis not present

## 2020-03-30 DIAGNOSIS — I4892 Unspecified atrial flutter: Secondary | ICD-10-CM | POA: Diagnosis not present

## 2020-03-30 DIAGNOSIS — J449 Chronic obstructive pulmonary disease, unspecified: Secondary | ICD-10-CM | POA: Diagnosis not present

## 2020-03-30 DIAGNOSIS — K861 Other chronic pancreatitis: Secondary | ICD-10-CM | POA: Diagnosis not present

## 2020-03-30 DIAGNOSIS — D500 Iron deficiency anemia secondary to blood loss (chronic): Secondary | ICD-10-CM | POA: Diagnosis not present

## 2020-03-30 DIAGNOSIS — K831 Obstruction of bile duct: Secondary | ICD-10-CM | POA: Diagnosis not present

## 2020-03-30 DIAGNOSIS — N183 Chronic kidney disease, stage 3 unspecified: Secondary | ICD-10-CM | POA: Diagnosis not present

## 2020-03-30 DIAGNOSIS — I2510 Atherosclerotic heart disease of native coronary artery without angina pectoris: Secondary | ICD-10-CM | POA: Diagnosis not present

## 2020-03-30 DIAGNOSIS — R1312 Dysphagia, oropharyngeal phase: Secondary | ICD-10-CM | POA: Diagnosis not present

## 2020-03-30 DIAGNOSIS — E538 Deficiency of other specified B group vitamins: Secondary | ICD-10-CM | POA: Diagnosis not present

## 2020-03-30 DIAGNOSIS — E785 Hyperlipidemia, unspecified: Secondary | ICD-10-CM | POA: Diagnosis not present

## 2020-03-30 DIAGNOSIS — I129 Hypertensive chronic kidney disease with stage 1 through stage 4 chronic kidney disease, or unspecified chronic kidney disease: Secondary | ICD-10-CM | POA: Diagnosis not present

## 2020-04-01 ENCOUNTER — Ambulatory Visit: Payer: Medicare Other | Admitting: Family

## 2020-04-01 DIAGNOSIS — Z85068 Personal history of other malignant neoplasm of small intestine: Secondary | ICD-10-CM | POA: Diagnosis not present

## 2020-04-01 DIAGNOSIS — I4892 Unspecified atrial flutter: Secondary | ICD-10-CM | POA: Diagnosis not present

## 2020-04-01 DIAGNOSIS — N183 Chronic kidney disease, stage 3 unspecified: Secondary | ICD-10-CM | POA: Diagnosis not present

## 2020-04-01 DIAGNOSIS — E538 Deficiency of other specified B group vitamins: Secondary | ICD-10-CM | POA: Diagnosis not present

## 2020-04-01 DIAGNOSIS — E1122 Type 2 diabetes mellitus with diabetic chronic kidney disease: Secondary | ICD-10-CM | POA: Diagnosis not present

## 2020-04-01 DIAGNOSIS — K861 Other chronic pancreatitis: Secondary | ICD-10-CM | POA: Diagnosis not present

## 2020-04-01 DIAGNOSIS — K219 Gastro-esophageal reflux disease without esophagitis: Secondary | ICD-10-CM | POA: Diagnosis not present

## 2020-04-01 DIAGNOSIS — I2510 Atherosclerotic heart disease of native coronary artery without angina pectoris: Secondary | ICD-10-CM | POA: Diagnosis not present

## 2020-04-01 DIAGNOSIS — R1312 Dysphagia, oropharyngeal phase: Secondary | ICD-10-CM | POA: Diagnosis not present

## 2020-04-01 DIAGNOSIS — E440 Moderate protein-calorie malnutrition: Secondary | ICD-10-CM | POA: Diagnosis not present

## 2020-04-01 DIAGNOSIS — I129 Hypertensive chronic kidney disease with stage 1 through stage 4 chronic kidney disease, or unspecified chronic kidney disease: Secondary | ICD-10-CM | POA: Diagnosis not present

## 2020-04-01 DIAGNOSIS — D500 Iron deficiency anemia secondary to blood loss (chronic): Secondary | ICD-10-CM | POA: Diagnosis not present

## 2020-04-01 DIAGNOSIS — K831 Obstruction of bile duct: Secondary | ICD-10-CM | POA: Diagnosis not present

## 2020-04-01 DIAGNOSIS — Z8701 Personal history of pneumonia (recurrent): Secondary | ICD-10-CM | POA: Diagnosis not present

## 2020-04-01 DIAGNOSIS — E785 Hyperlipidemia, unspecified: Secondary | ICD-10-CM | POA: Diagnosis not present

## 2020-04-01 DIAGNOSIS — J449 Chronic obstructive pulmonary disease, unspecified: Secondary | ICD-10-CM | POA: Diagnosis not present

## 2020-04-03 DIAGNOSIS — K219 Gastro-esophageal reflux disease without esophagitis: Secondary | ICD-10-CM | POA: Diagnosis not present

## 2020-04-03 DIAGNOSIS — R1312 Dysphagia, oropharyngeal phase: Secondary | ICD-10-CM | POA: Diagnosis not present

## 2020-04-03 DIAGNOSIS — I4892 Unspecified atrial flutter: Secondary | ICD-10-CM | POA: Diagnosis not present

## 2020-04-03 DIAGNOSIS — E440 Moderate protein-calorie malnutrition: Secondary | ICD-10-CM | POA: Diagnosis not present

## 2020-04-03 DIAGNOSIS — E785 Hyperlipidemia, unspecified: Secondary | ICD-10-CM | POA: Diagnosis not present

## 2020-04-03 DIAGNOSIS — E1122 Type 2 diabetes mellitus with diabetic chronic kidney disease: Secondary | ICD-10-CM | POA: Diagnosis not present

## 2020-04-03 DIAGNOSIS — Z8701 Personal history of pneumonia (recurrent): Secondary | ICD-10-CM | POA: Diagnosis not present

## 2020-04-03 DIAGNOSIS — D500 Iron deficiency anemia secondary to blood loss (chronic): Secondary | ICD-10-CM | POA: Diagnosis not present

## 2020-04-03 DIAGNOSIS — J449 Chronic obstructive pulmonary disease, unspecified: Secondary | ICD-10-CM | POA: Diagnosis not present

## 2020-04-03 DIAGNOSIS — K831 Obstruction of bile duct: Secondary | ICD-10-CM | POA: Diagnosis not present

## 2020-04-03 DIAGNOSIS — I2510 Atherosclerotic heart disease of native coronary artery without angina pectoris: Secondary | ICD-10-CM | POA: Diagnosis not present

## 2020-04-03 DIAGNOSIS — K861 Other chronic pancreatitis: Secondary | ICD-10-CM | POA: Diagnosis not present

## 2020-04-03 DIAGNOSIS — E538 Deficiency of other specified B group vitamins: Secondary | ICD-10-CM | POA: Diagnosis not present

## 2020-04-03 DIAGNOSIS — I129 Hypertensive chronic kidney disease with stage 1 through stage 4 chronic kidney disease, or unspecified chronic kidney disease: Secondary | ICD-10-CM | POA: Diagnosis not present

## 2020-04-03 DIAGNOSIS — Z85068 Personal history of other malignant neoplasm of small intestine: Secondary | ICD-10-CM | POA: Diagnosis not present

## 2020-04-03 DIAGNOSIS — N183 Chronic kidney disease, stage 3 unspecified: Secondary | ICD-10-CM | POA: Diagnosis not present

## 2020-04-07 DIAGNOSIS — E785 Hyperlipidemia, unspecified: Secondary | ICD-10-CM | POA: Diagnosis not present

## 2020-04-07 DIAGNOSIS — D500 Iron deficiency anemia secondary to blood loss (chronic): Secondary | ICD-10-CM | POA: Diagnosis not present

## 2020-04-07 DIAGNOSIS — Z85068 Personal history of other malignant neoplasm of small intestine: Secondary | ICD-10-CM | POA: Diagnosis not present

## 2020-04-07 DIAGNOSIS — K831 Obstruction of bile duct: Secondary | ICD-10-CM | POA: Diagnosis not present

## 2020-04-07 DIAGNOSIS — J449 Chronic obstructive pulmonary disease, unspecified: Secondary | ICD-10-CM | POA: Diagnosis not present

## 2020-04-07 DIAGNOSIS — I129 Hypertensive chronic kidney disease with stage 1 through stage 4 chronic kidney disease, or unspecified chronic kidney disease: Secondary | ICD-10-CM | POA: Diagnosis not present

## 2020-04-07 DIAGNOSIS — R1312 Dysphagia, oropharyngeal phase: Secondary | ICD-10-CM | POA: Diagnosis not present

## 2020-04-07 DIAGNOSIS — Z8701 Personal history of pneumonia (recurrent): Secondary | ICD-10-CM | POA: Diagnosis not present

## 2020-04-07 DIAGNOSIS — E440 Moderate protein-calorie malnutrition: Secondary | ICD-10-CM | POA: Diagnosis not present

## 2020-04-07 DIAGNOSIS — N183 Chronic kidney disease, stage 3 unspecified: Secondary | ICD-10-CM | POA: Diagnosis not present

## 2020-04-07 DIAGNOSIS — I2510 Atherosclerotic heart disease of native coronary artery without angina pectoris: Secondary | ICD-10-CM | POA: Diagnosis not present

## 2020-04-07 DIAGNOSIS — K219 Gastro-esophageal reflux disease without esophagitis: Secondary | ICD-10-CM | POA: Diagnosis not present

## 2020-04-07 DIAGNOSIS — I4892 Unspecified atrial flutter: Secondary | ICD-10-CM | POA: Diagnosis not present

## 2020-04-07 DIAGNOSIS — E1122 Type 2 diabetes mellitus with diabetic chronic kidney disease: Secondary | ICD-10-CM | POA: Diagnosis not present

## 2020-04-07 DIAGNOSIS — K861 Other chronic pancreatitis: Secondary | ICD-10-CM | POA: Diagnosis not present

## 2020-04-07 DIAGNOSIS — E538 Deficiency of other specified B group vitamins: Secondary | ICD-10-CM | POA: Diagnosis not present

## 2020-04-08 ENCOUNTER — Other Ambulatory Visit: Payer: Self-pay

## 2020-04-08 DIAGNOSIS — Z8701 Personal history of pneumonia (recurrent): Secondary | ICD-10-CM | POA: Diagnosis not present

## 2020-04-08 DIAGNOSIS — K861 Other chronic pancreatitis: Secondary | ICD-10-CM | POA: Diagnosis not present

## 2020-04-08 DIAGNOSIS — E440 Moderate protein-calorie malnutrition: Secondary | ICD-10-CM | POA: Diagnosis not present

## 2020-04-08 DIAGNOSIS — Z85068 Personal history of other malignant neoplasm of small intestine: Secondary | ICD-10-CM | POA: Diagnosis not present

## 2020-04-08 DIAGNOSIS — I129 Hypertensive chronic kidney disease with stage 1 through stage 4 chronic kidney disease, or unspecified chronic kidney disease: Secondary | ICD-10-CM | POA: Diagnosis not present

## 2020-04-08 DIAGNOSIS — I4892 Unspecified atrial flutter: Secondary | ICD-10-CM | POA: Diagnosis not present

## 2020-04-08 DIAGNOSIS — E785 Hyperlipidemia, unspecified: Secondary | ICD-10-CM | POA: Diagnosis not present

## 2020-04-08 DIAGNOSIS — J449 Chronic obstructive pulmonary disease, unspecified: Secondary | ICD-10-CM | POA: Diagnosis not present

## 2020-04-08 DIAGNOSIS — K219 Gastro-esophageal reflux disease without esophagitis: Secondary | ICD-10-CM | POA: Diagnosis not present

## 2020-04-08 DIAGNOSIS — I2510 Atherosclerotic heart disease of native coronary artery without angina pectoris: Secondary | ICD-10-CM | POA: Diagnosis not present

## 2020-04-08 DIAGNOSIS — E538 Deficiency of other specified B group vitamins: Secondary | ICD-10-CM | POA: Diagnosis not present

## 2020-04-08 DIAGNOSIS — N183 Chronic kidney disease, stage 3 unspecified: Secondary | ICD-10-CM | POA: Diagnosis not present

## 2020-04-08 DIAGNOSIS — D500 Iron deficiency anemia secondary to blood loss (chronic): Secondary | ICD-10-CM | POA: Diagnosis not present

## 2020-04-08 DIAGNOSIS — K831 Obstruction of bile duct: Secondary | ICD-10-CM | POA: Diagnosis not present

## 2020-04-08 DIAGNOSIS — E1122 Type 2 diabetes mellitus with diabetic chronic kidney disease: Secondary | ICD-10-CM | POA: Diagnosis not present

## 2020-04-08 DIAGNOSIS — R1312 Dysphagia, oropharyngeal phase: Secondary | ICD-10-CM | POA: Diagnosis not present

## 2020-04-08 MED ORDER — METOPROLOL SUCCINATE ER 50 MG PO TB24: ORAL_TABLET | ORAL | 0 refills | Status: DC

## 2020-04-10 DIAGNOSIS — R1312 Dysphagia, oropharyngeal phase: Secondary | ICD-10-CM | POA: Diagnosis not present

## 2020-04-10 DIAGNOSIS — K219 Gastro-esophageal reflux disease without esophagitis: Secondary | ICD-10-CM | POA: Diagnosis not present

## 2020-04-10 DIAGNOSIS — Z8701 Personal history of pneumonia (recurrent): Secondary | ICD-10-CM | POA: Diagnosis not present

## 2020-04-10 DIAGNOSIS — J449 Chronic obstructive pulmonary disease, unspecified: Secondary | ICD-10-CM | POA: Diagnosis not present

## 2020-04-10 DIAGNOSIS — E538 Deficiency of other specified B group vitamins: Secondary | ICD-10-CM | POA: Diagnosis not present

## 2020-04-10 DIAGNOSIS — D500 Iron deficiency anemia secondary to blood loss (chronic): Secondary | ICD-10-CM | POA: Diagnosis not present

## 2020-04-10 DIAGNOSIS — K861 Other chronic pancreatitis: Secondary | ICD-10-CM | POA: Diagnosis not present

## 2020-04-10 DIAGNOSIS — I2510 Atherosclerotic heart disease of native coronary artery without angina pectoris: Secondary | ICD-10-CM | POA: Diagnosis not present

## 2020-04-10 DIAGNOSIS — I129 Hypertensive chronic kidney disease with stage 1 through stage 4 chronic kidney disease, or unspecified chronic kidney disease: Secondary | ICD-10-CM | POA: Diagnosis not present

## 2020-04-10 DIAGNOSIS — K831 Obstruction of bile duct: Secondary | ICD-10-CM | POA: Diagnosis not present

## 2020-04-10 DIAGNOSIS — E440 Moderate protein-calorie malnutrition: Secondary | ICD-10-CM | POA: Diagnosis not present

## 2020-04-10 DIAGNOSIS — I4892 Unspecified atrial flutter: Secondary | ICD-10-CM | POA: Diagnosis not present

## 2020-04-10 DIAGNOSIS — Z85068 Personal history of other malignant neoplasm of small intestine: Secondary | ICD-10-CM | POA: Diagnosis not present

## 2020-04-10 DIAGNOSIS — E1122 Type 2 diabetes mellitus with diabetic chronic kidney disease: Secondary | ICD-10-CM | POA: Diagnosis not present

## 2020-04-10 DIAGNOSIS — E785 Hyperlipidemia, unspecified: Secondary | ICD-10-CM | POA: Diagnosis not present

## 2020-04-10 DIAGNOSIS — N183 Chronic kidney disease, stage 3 unspecified: Secondary | ICD-10-CM | POA: Diagnosis not present

## 2020-04-14 DIAGNOSIS — K831 Obstruction of bile duct: Secondary | ICD-10-CM | POA: Diagnosis not present

## 2020-04-14 DIAGNOSIS — N183 Chronic kidney disease, stage 3 unspecified: Secondary | ICD-10-CM | POA: Diagnosis not present

## 2020-04-14 DIAGNOSIS — R1312 Dysphagia, oropharyngeal phase: Secondary | ICD-10-CM | POA: Diagnosis not present

## 2020-04-14 DIAGNOSIS — K861 Other chronic pancreatitis: Secondary | ICD-10-CM | POA: Diagnosis not present

## 2020-04-14 DIAGNOSIS — I4892 Unspecified atrial flutter: Secondary | ICD-10-CM | POA: Diagnosis not present

## 2020-04-14 DIAGNOSIS — Z85068 Personal history of other malignant neoplasm of small intestine: Secondary | ICD-10-CM | POA: Diagnosis not present

## 2020-04-14 DIAGNOSIS — Z8701 Personal history of pneumonia (recurrent): Secondary | ICD-10-CM | POA: Diagnosis not present

## 2020-04-14 DIAGNOSIS — E538 Deficiency of other specified B group vitamins: Secondary | ICD-10-CM | POA: Diagnosis not present

## 2020-04-14 DIAGNOSIS — E1122 Type 2 diabetes mellitus with diabetic chronic kidney disease: Secondary | ICD-10-CM | POA: Diagnosis not present

## 2020-04-14 DIAGNOSIS — D500 Iron deficiency anemia secondary to blood loss (chronic): Secondary | ICD-10-CM | POA: Diagnosis not present

## 2020-04-14 DIAGNOSIS — K219 Gastro-esophageal reflux disease without esophagitis: Secondary | ICD-10-CM | POA: Diagnosis not present

## 2020-04-14 DIAGNOSIS — I2510 Atherosclerotic heart disease of native coronary artery without angina pectoris: Secondary | ICD-10-CM | POA: Diagnosis not present

## 2020-04-14 DIAGNOSIS — E440 Moderate protein-calorie malnutrition: Secondary | ICD-10-CM | POA: Diagnosis not present

## 2020-04-14 DIAGNOSIS — E785 Hyperlipidemia, unspecified: Secondary | ICD-10-CM | POA: Diagnosis not present

## 2020-04-14 DIAGNOSIS — J449 Chronic obstructive pulmonary disease, unspecified: Secondary | ICD-10-CM | POA: Diagnosis not present

## 2020-04-14 DIAGNOSIS — I129 Hypertensive chronic kidney disease with stage 1 through stage 4 chronic kidney disease, or unspecified chronic kidney disease: Secondary | ICD-10-CM | POA: Diagnosis not present

## 2020-04-15 ENCOUNTER — Other Ambulatory Visit: Payer: Self-pay | Admitting: Internal Medicine

## 2020-04-15 ENCOUNTER — Other Ambulatory Visit: Payer: Self-pay | Admitting: *Deleted

## 2020-04-15 NOTE — Telephone Encounter (Signed)
Lmovm to contact office to verify metoprolol dose/instructions.

## 2020-04-15 NOTE — Telephone Encounter (Signed)
Lmovm to verify Metoprolol XL dosage. Fax received from pharmacy noted that pt mentioned that he is taking Metoprolol XL 25 mg and not Metoprolol XL 50 mg.

## 2020-04-16 DIAGNOSIS — K861 Other chronic pancreatitis: Secondary | ICD-10-CM | POA: Diagnosis not present

## 2020-04-16 DIAGNOSIS — Z85068 Personal history of other malignant neoplasm of small intestine: Secondary | ICD-10-CM | POA: Diagnosis not present

## 2020-04-16 DIAGNOSIS — N183 Chronic kidney disease, stage 3 unspecified: Secondary | ICD-10-CM | POA: Diagnosis not present

## 2020-04-16 DIAGNOSIS — E440 Moderate protein-calorie malnutrition: Secondary | ICD-10-CM | POA: Diagnosis not present

## 2020-04-16 DIAGNOSIS — R1312 Dysphagia, oropharyngeal phase: Secondary | ICD-10-CM | POA: Diagnosis not present

## 2020-04-16 DIAGNOSIS — J449 Chronic obstructive pulmonary disease, unspecified: Secondary | ICD-10-CM | POA: Diagnosis not present

## 2020-04-16 DIAGNOSIS — I4892 Unspecified atrial flutter: Secondary | ICD-10-CM | POA: Diagnosis not present

## 2020-04-16 DIAGNOSIS — I129 Hypertensive chronic kidney disease with stage 1 through stage 4 chronic kidney disease, or unspecified chronic kidney disease: Secondary | ICD-10-CM | POA: Diagnosis not present

## 2020-04-16 DIAGNOSIS — K831 Obstruction of bile duct: Secondary | ICD-10-CM | POA: Diagnosis not present

## 2020-04-16 DIAGNOSIS — Z8701 Personal history of pneumonia (recurrent): Secondary | ICD-10-CM | POA: Diagnosis not present

## 2020-04-16 DIAGNOSIS — E538 Deficiency of other specified B group vitamins: Secondary | ICD-10-CM | POA: Diagnosis not present

## 2020-04-16 DIAGNOSIS — I2510 Atherosclerotic heart disease of native coronary artery without angina pectoris: Secondary | ICD-10-CM | POA: Diagnosis not present

## 2020-04-16 DIAGNOSIS — E785 Hyperlipidemia, unspecified: Secondary | ICD-10-CM | POA: Diagnosis not present

## 2020-04-16 DIAGNOSIS — E1122 Type 2 diabetes mellitus with diabetic chronic kidney disease: Secondary | ICD-10-CM | POA: Diagnosis not present

## 2020-04-16 DIAGNOSIS — K219 Gastro-esophageal reflux disease without esophagitis: Secondary | ICD-10-CM | POA: Diagnosis not present

## 2020-04-16 DIAGNOSIS — D500 Iron deficiency anemia secondary to blood loss (chronic): Secondary | ICD-10-CM | POA: Diagnosis not present

## 2020-04-16 NOTE — Telephone Encounter (Signed)
Lmovm to contact office to verify with pt Metoprolol dose and how pt is taking medication.

## 2020-04-16 NOTE — Telephone Encounter (Signed)
[facility] calling to confirm correct dose of metoprolol .

## 2020-04-17 ENCOUNTER — Other Ambulatory Visit: Payer: Self-pay | Admitting: *Deleted

## 2020-04-17 DIAGNOSIS — I10 Essential (primary) hypertension: Secondary | ICD-10-CM | POA: Diagnosis not present

## 2020-04-17 DIAGNOSIS — E1122 Type 2 diabetes mellitus with diabetic chronic kidney disease: Secondary | ICD-10-CM | POA: Diagnosis not present

## 2020-04-17 DIAGNOSIS — E785 Hyperlipidemia, unspecified: Secondary | ICD-10-CM | POA: Diagnosis not present

## 2020-04-17 DIAGNOSIS — E1165 Type 2 diabetes mellitus with hyperglycemia: Secondary | ICD-10-CM | POA: Diagnosis not present

## 2020-04-17 MED ORDER — AMIODARONE HCL 200 MG PO TABS: 200.0000 mg | ORAL_TABLET | Freq: Every day | ORAL | 3 refills | Status: DC

## 2020-04-17 MED ORDER — APIXABAN 5 MG PO TABS: 5.0000 mg | ORAL_TABLET | Freq: Two times a day (BID) | ORAL | 6 refills | Status: DC

## 2020-04-17 MED ORDER — ROSUVASTATIN CALCIUM 40 MG PO TABS: 40.0000 mg | ORAL_TABLET | Freq: Every day | ORAL | 3 refills | Status: DC

## 2020-04-17 MED ORDER — EUTHYROX 25 MCG PO TABS: 25.0000 ug | ORAL_TABLET | Freq: Every morning | ORAL | 3 refills | Status: DC

## 2020-04-21 ENCOUNTER — Ambulatory Visit (INDEPENDENT_AMBULATORY_CARE_PROVIDER_SITE_OTHER): Payer: Medicare Other | Admitting: Internal Medicine

## 2020-04-21 ENCOUNTER — Encounter: Payer: Self-pay | Admitting: Internal Medicine

## 2020-04-21 ENCOUNTER — Other Ambulatory Visit: Payer: Self-pay

## 2020-04-21 VITALS — BP 151/57 | HR 58 | Wt 146.5 lb

## 2020-04-21 DIAGNOSIS — E1165 Type 2 diabetes mellitus with hyperglycemia: Secondary | ICD-10-CM

## 2020-04-21 DIAGNOSIS — N2581 Secondary hyperparathyroidism of renal origin: Secondary | ICD-10-CM | POA: Diagnosis not present

## 2020-04-21 DIAGNOSIS — N1832 Chronic kidney disease, stage 3b: Secondary | ICD-10-CM

## 2020-04-21 DIAGNOSIS — K859 Acute pancreatitis without necrosis or infection, unspecified: Secondary | ICD-10-CM | POA: Diagnosis not present

## 2020-04-21 DIAGNOSIS — D696 Thrombocytopenia, unspecified: Secondary | ICD-10-CM

## 2020-04-21 DIAGNOSIS — D751 Secondary polycythemia: Secondary | ICD-10-CM

## 2020-04-21 DIAGNOSIS — I10 Essential (primary) hypertension: Secondary | ICD-10-CM | POA: Diagnosis not present

## 2020-04-21 DIAGNOSIS — I483 Typical atrial flutter: Secondary | ICD-10-CM

## 2020-04-21 MED ORDER — MIDODRINE HCL 10 MG PO TABS: 10.0000 mg | ORAL_TABLET | Freq: Three times a day (TID) | ORAL | 6 refills | Status: DC

## 2020-04-21 NOTE — Assessment & Plan Note (Signed)
Controlled.  

## 2020-04-21 NOTE — Assessment & Plan Note (Signed)
Stable now. 

## 2020-04-21 NOTE — Assessment & Plan Note (Signed)
Patient had acute pancreatitis  due to bili tract obstruction.[name]  He is not having any more pain in the abdomen.  He is going to see the GI specialist at [facility] tomorrow.  and he is talking about having kidneystent placed .I we will leave it up to the urologist.

## 2020-04-21 NOTE — Assessment & Plan Note (Signed)
Stable

## 2020-04-21 NOTE — Progress Notes (Signed)
[name], male   DOB: 04/17/1946, 74 y.o.   MRN: [id number]    Established Patient Office Visit  Subjective:  [name], male    DOB: 04-09-1946  Age: 74 y.o. MRN: [id number]  CC: No chief complaint on file.   HPI  [name] presents today to discuss his hypertension. He reports that he recently stopped taking one of his blood pressure medications, but is not sure what it is called. The patient denies eye, ear, and throat problems, chest pain, leg pain, and stomach pain. He was recently admitted at [facility] for 35 days for pneumonia, a GI bleed, and hypotension. He is following up with his cardiologist at [facility] next week. He reports that he has some scrotal swelling, but that it is improving. He also reports that his hands shake occasionally. The patient received his first dose of the COVID-19 vaccine in March, but hasn't received his second one yet. He began driving again recently and quit smoking and drinking in several months ago. He takes 12 units of insulin at night.  Past Medical History:  Diagnosis Date  . Aortic atherosclerosis ([place])    As seen on 04/2019 CT  . Atrial flutter ([place])    a. s/p ablation 2017/2018 and DCCV 2015/2016/2019 b. 2017 echo EF 60-65%, no RWMA, mild MR,  . CAD (coronary artery disease)    3v CAD 04/2019 CT  . Carcinoid tumor    metastatic  . CKD (chronic kidney disease), stage III   . COPD (chronic obstructive pulmonary disease) (HCC)    PM oxygen  . Current occasional smoker    1-2 cigarettes / week  . Diabetes mellitus type 2, insulin dependent ([place])   . Heart failure with preserved ejection fraction ([place])    2017 echo EF 60-65%, no RWMA, mild MR,    . Hyperlipidemia   . Hypertension   . Pancreatitis   . Polycythemia     Past Surgical History:  Procedure Laterality Date  . A-FLUTTER ABLATION N/A 10/14/2017   Procedure: A-FLUTTER ABLATION;  Surgeon: Constance Haw, MD;  Location: [place] [facility] LAB;  Service: Cardiovascular;  Laterality: N/A;  . CARDIOVERSION N/A 09/08/2017   Procedure: CARDIOVERSION;  Surgeon: [name], MD;  Location: [facility] ORS;  Service: Cardiovascular;  Laterality: N/A;  . CARDIOVERSION N/A 03/22/2018   Procedure: CARDIOVERSION;  Surgeon: [name], MD;  Location: [facility] ORS;  Service: Cardiovascular;  Laterality: N/A;  . COLONOSCOPY    . COLONOSCOPY WITH PROPOFOL N/A 10/31/2019   Procedure: COLONOSCOPY WITH PROPOFOL;  Surgeon: Lin Landsman, MD;  Location: [facility] ENDOSCOPY;  Service: Gastroenterology;  Laterality: N/A;  . ELECTROPHYSIOLOGIC STUDY N/A 10/27/2016   Procedure: A-Flutter Ablation;  Surgeon: Deboraha Sprang, MD;  Location: [place] CV LAB;  Service: Cardiovascular;  Laterality: N/A;  . EUS N/A 12/22/2017   Procedure: FULL UPPER ENDOSCOPIC ULTRASOUND (EUS) RADIAL;  Surgeon: Reita Cliche, MD;  Location: [facility] ENDOSCOPY;  Service: Gastroenterology;  Laterality: N/A;  . TEE WITH CARDIOVERSION  12/16  . TEMPORARY DIALYSIS CATHETER N/A 02/08/2020   Procedure: TEMPORARY DIALYSIS CATHETER;  Surgeon: Algernon Huxley, MD;  Location: [place] CV LAB;  Service: Cardiovascular;  Laterality: N/A;    Family History  Problem Relation Age of Onset  . Hypertension Father     Social History   Socioeconomic History  . Marital status: Widowed    Spouse name: Not on file  . Number of children: Not  on file  . Years of education: Not on file  . Highest education level: Not on file  Occupational History  . Not on file  Tobacco Use  . Smoking status: Light Tobacco Smoker    Years: 45.00    Types: Cigarettes    Last attempt to quit: 11/27/2012    Years since quitting: 7.4  . Smokeless tobacco: Never Used  Substance and Sexual Activity  . Alcohol use: Yes    Alcohol/week: 2.0 standard drinks    Types: 2 Cans of beer per week    Comment: beer every once a while once a monthnone last 24hrs  . Drug use: No  . Sexual activity: Not  on file  Other Topics Concern  . Not on file  Social History Narrative  . Not on file   Social Determinants of Health   Financial Resource Strain:   . Difficulty of Paying Living Expenses:   Food Insecurity:   . Worried About Charity fundraiser in the Last Year:   . Arboriculturist in the Last Year:   Transportation Needs:   . Film/video editor (Medical):   Marland Kitchen Lack of Transportation (Non-Medical):   Physical Activity:   . Days of Exercise per Week:   . Minutes of Exercise per Session:   Stress:   . Feeling of Stress :   Social Connections:   . Frequency of Communication with Friends and Family:   . Frequency of Social Gatherings with Friends and Family:   . Attends Religious Services:   . Active Member of Clubs or Organizations:   . Attends Archivist Meetings:   Marland Kitchen Marital Status:   Intimate Partner Violence:   . Fear of Current or Ex-Partner:   . Emotionally Abused:   Marland Kitchen Physically Abused:   . Sexually Abused:      Current Outpatient Medications:  .  acetaminophen (TYLENOL) 500 MG tablet, Take 500-1,000 mg by mouth every 6 (six) hours as needed for moderate pain or headache. , Disp: , Rfl:  .  albuterol (PROAIR HFA) 108 (90 Base) MCG/ACT inhaler, Inhale 2 puffs into the lungs every 4 (four) hours as needed for wheezing or shortness of breath. , Disp: , Rfl:  .  amiodarone (PACERONE) 200 MG tablet, Take 1 tablet (200 mg total) by mouth daily., Disp: 90 tablet, Rfl: 3 .  apixaban (ELIQUIS) 5 MG TABS tablet, Take 1 tablet (5 mg total) by mouth 2 (two) times daily., Disp: 60 tablet, Rfl: 6 .  budesonide-formoterol (SYMBICORT) 80-4.5 MCG/ACT inhaler, Inhale 2 puffs into the lungs 2 (two) times daily. Takes at noon and at 4pm., Disp: , Rfl:  .  EUTHYROX 25 MCG tablet, Take 1 tablet (25 mcg total) by mouth every morning., Disp: 90 tablet, Rfl: 3 .  Insulin Glargine (LANTUS SOLOSTAR) 100 UNIT/ML Solostar Pen, Inject 5 Units into the skin at bedtime. , Disp: , Rfl:    .  levothyroxine (SYNTHROID, LEVOTHROID) 50 MCG tablet, Take 25 mcg by mouth daily. , Disp: , Rfl:  .  metoprolol succinate (TOPROL-XL) 50 MG 24 hr tablet, TAKE 1 TABLET BY MOUTH IN THE EVENING. TAKE WITH OR IMMEDIATELY FOLLOWING A MEAL, Disp: 90 tablet, Rfl: 0 .  midodrine (PROAMATINE) 10 MG tablet, Take 1 tablet (10 mg total) by mouth 3 (three) times daily., Disp: 90 tablet, Rfl: 6 .  omeprazole (PRILOSEC) 20 MG capsule, Take 1 capsule by mouth once daily, Disp: 60 capsule, Rfl: 0 .  rosuvastatin (CRESTOR)  40 MG tablet, Take 1 tablet (40 mg total) by mouth daily., Disp: 90 tablet, Rfl: 3 .  vitamin B-12 (CYANOCOBALAMIN) 500 MCG tablet, Take 500 mcg by mouth daily. , Disp: , Rfl:    Allergies  Allergen Reactions  . Levaquin [Levofloxacin In D5w] Swelling and Rash    ROS Review of Systems  Constitutional: Negative.   HENT: Negative.   Eyes: Negative.   Respiratory: Negative.   Cardiovascular: Negative.  Negative for chest pain and leg swelling.  Gastrointestinal: Negative.  Negative for abdominal pain.  Endocrine: Negative.   Genitourinary: Positive for scrotal swelling.  Musculoskeletal: Negative.   Skin: Negative.   Allergic/Immunologic: Negative.   Neurological: Positive for tremors.  Hematological: Negative.   Psychiatric/Behavioral: Negative.       Objective:    Physical Exam  Constitutional: The patient is oriented to person, place, and time. Pt appears well-developed and well-nourished.  Head: Normocephalic and atraumatic.  Eyes: Pupils are equal, round, and reactive to light.  Neck: No JVD present. No tracheal deviation present. No thyromegaly present.  Cardiovascular: Regular rate and rhythm. No gallop. Trace ankle edema. Pulmonary/Chest: Normal breath sounds. Lungs clear to auscultation. Abdominal: No abdominal tenderness. No guarding or rebound tenderness. No hepatosplenomegaly.  Musculoskeletal: Normal range of motion.  Lymphatic: No cervical adenopathy.   Neurological: No cranial nerve deficit.  Skin: Skin is warm and hydrated.  Psychiatric: The patient has a normal mood and affect.  BP (!) 151/57   Pulse (!) 58   Wt 146 lb 8 oz (66.5 kg)   BMI 22.95 kg/m  Wt Readings from Last 3 Encounters:  04/21/20 146 lb 8 oz (66.5 kg)  03/21/20 144 lb (65.3 kg)  02/08/20 158 lb 15.2 oz (72.1 kg)     Health Maintenance Due  Topic Date Due  . Hepatitis C Screening  Never done  . FOOT EXAM  Never done  . OPHTHALMOLOGY EXAM  Never done  . URINE MICROALBUMIN  Never done  . COVID-19 Vaccine (1) Never done  . TETANUS/TDAP  Never done  . HEMOGLOBIN A1C  05/01/2015    There are no preventive care reminders to display for this patient.  Lab Results  Component Value Date   TSH 2.800 11/25/2016   Lab Results  Component Value Date   WBC 7.3 02/08/2020   HGB 14.8 02/08/2020   HCT 44.6 02/08/2020   MCV 91.2 02/08/2020   PLT 121 (L) 02/08/2020   Lab Results  Component Value Date   NA 133 (L) 02/08/2020   K 4.6 02/08/2020   CO2 19 (L) 02/08/2020   GLUCOSE 269 (H) 02/08/2020   BUN 88 (H) 02/08/2020   CREATININE 10.64 (H) 02/08/2020   BILITOT 8.9 (H) 02/08/2020   ALKPHOS 730 (H) 02/08/2020   AST 76 (H) 02/08/2020   ALT 125 (H) 02/08/2020   PROT 6.6 02/08/2020   ALBUMIN 3.3 (L) 02/08/2020   CALCIUM 7.9 (L) 02/08/2020   ANIONGAP 15 02/08/2020   Lab Results  Component Value Date   CHOL 79 11/26/2018   Lab Results  Component Value Date   HDL 32 (L) 11/26/2018   Lab Results  Component Value Date   LDLCALC 27 11/26/2018   Lab Results  Component Value Date   TRIG 98 11/26/2018   Lab Results  Component Value Date   CHOLHDL 2.5 11/26/2018   Lab Results  Component Value Date   HGBA1C 7.0 (H) 10/30/2014      Assessment & Plan:   Problem List Items  Addressed This Visit      Cardiovascular and Mediastinum   Essential hypertension    Stable now      Relevant Medications   midodrine (PROAMATINE) 10 MG tablet   Atrial  flutter (HCC) - Primary   Relevant Medications   midodrine (PROAMATINE) 10 MG tablet     Digestive   Acute pancreatitis    Patient had acute pancreatitis  due to bili tract obstruction.Marland Kitchen  He is not having any more pain in the abdomen.  He is going to see the GI specialist at [facility] tomorrow.  and he is talking about having kidneystent placed .I we will leave it up to the urologist.        Endocrine   Diabetes type 2, uncontrolled ([place])   Secondary hyperparathyroidism of renal origin ([place])    Stable.        Genitourinary   Stage 3b chronic kidney disease     Other   Erythrocytosis   Thrombocytopenia, unspecified ([place])      Meds ordered this encounter  Medications  . midodrine (PROAMATINE) 10 MG tablet    Sig: Take 1 tablet (10 mg total) by mouth 3 (three) times daily.    Dispense:  90 tablet    Refill:  6   1.  atrial flutter (HCC) Stable.  2. Essential hypertension Pressure is under control.  3. Acute pancreatitis without infection or necrosis, unspecified pancreatitis type Otitis has resolved.  Patient is nontender in the abdomen bowel sounds are audible.  He is going to see the liver specialist tomorrow for follow-up.  4. Secondary hyperparathyroidism of renal origin ([place]) Controlled.  5. Uncontrolled type 2 diabetes mellitus with hyperglycemia (HCC)   6. Stage 3b chronic kidney disease Patient may follow-up by a kidney specialist and neurologist for stage III kidney disease.  7. Erythrocytosis CBC  8. Thrombocytopenia, unspecified ([place]) Repeat CBC sometime. Follow-up: Return in about 4 weeks (around 05/19/2020).   By signing my name below, I, De Burrs, attest that this documentation has been prepared under the direction and in the presence of [name], MD. Electronically Signed: De Burrs, Medical Scribe. 04/21/20. 12:10 PM.  I personally performed the services described in this documentation, which was SCRIBED in my presence. The recorded  information has been reviewed and considered accurate. It has been edited as necessary during review. [name], MD

## 2020-04-22 DIAGNOSIS — E279 Disorder of adrenal gland, unspecified: Secondary | ICD-10-CM | POA: Diagnosis not present

## 2020-04-22 DIAGNOSIS — R1909 Other intra-abdominal and pelvic swelling, mass and lump: Secondary | ICD-10-CM | POA: Diagnosis not present

## 2020-04-22 DIAGNOSIS — J449 Chronic obstructive pulmonary disease, unspecified: Secondary | ICD-10-CM | POA: Diagnosis not present

## 2020-04-22 DIAGNOSIS — Z794 Long term (current) use of insulin: Secondary | ICD-10-CM | POA: Diagnosis not present

## 2020-04-22 DIAGNOSIS — D3A8 Other benign neuroendocrine tumors: Secondary | ICD-10-CM | POA: Diagnosis not present

## 2020-04-22 DIAGNOSIS — Z87891 Personal history of nicotine dependence: Secondary | ICD-10-CM | POA: Diagnosis not present

## 2020-04-22 DIAGNOSIS — R7989 Other specified abnormal findings of blood chemistry: Secondary | ICD-10-CM | POA: Diagnosis not present

## 2020-04-22 DIAGNOSIS — C7B00 Secondary carcinoid tumors, unspecified site: Secondary | ICD-10-CM | POA: Diagnosis not present

## 2020-04-22 DIAGNOSIS — C7A8 Other malignant neuroendocrine tumors: Secondary | ICD-10-CM | POA: Diagnosis not present

## 2020-04-22 DIAGNOSIS — I959 Hypotension, unspecified: Secondary | ICD-10-CM | POA: Diagnosis not present

## 2020-04-22 DIAGNOSIS — C7A019 Malignant carcinoid tumor of the small intestine, unspecified portion: Secondary | ICD-10-CM | POA: Diagnosis not present

## 2020-04-22 DIAGNOSIS — E119 Type 2 diabetes mellitus without complications: Secondary | ICD-10-CM | POA: Diagnosis not present

## 2020-04-22 DIAGNOSIS — E278 Other specified disorders of adrenal gland: Secondary | ICD-10-CM | POA: Diagnosis not present

## 2020-04-22 DIAGNOSIS — R918 Other nonspecific abnormal finding of lung field: Secondary | ICD-10-CM | POA: Diagnosis not present

## 2020-04-22 DIAGNOSIS — N183 Chronic kidney disease, stage 3 unspecified: Secondary | ICD-10-CM | POA: Diagnosis not present

## 2020-04-22 DIAGNOSIS — Z79899 Other long term (current) drug therapy: Secondary | ICD-10-CM | POA: Diagnosis not present

## 2020-04-22 DIAGNOSIS — N179 Acute kidney failure, unspecified: Secondary | ICD-10-CM | POA: Diagnosis not present

## 2020-04-23 DIAGNOSIS — K219 Gastro-esophageal reflux disease without esophagitis: Secondary | ICD-10-CM | POA: Diagnosis not present

## 2020-04-23 DIAGNOSIS — R1312 Dysphagia, oropharyngeal phase: Secondary | ICD-10-CM | POA: Diagnosis not present

## 2020-04-23 DIAGNOSIS — I4892 Unspecified atrial flutter: Secondary | ICD-10-CM | POA: Diagnosis not present

## 2020-04-23 DIAGNOSIS — E1122 Type 2 diabetes mellitus with diabetic chronic kidney disease: Secondary | ICD-10-CM | POA: Diagnosis not present

## 2020-04-23 DIAGNOSIS — K861 Other chronic pancreatitis: Secondary | ICD-10-CM | POA: Diagnosis not present

## 2020-04-23 DIAGNOSIS — Z85068 Personal history of other malignant neoplasm of small intestine: Secondary | ICD-10-CM | POA: Diagnosis not present

## 2020-04-23 DIAGNOSIS — I2510 Atherosclerotic heart disease of native coronary artery without angina pectoris: Secondary | ICD-10-CM | POA: Diagnosis not present

## 2020-04-23 DIAGNOSIS — J449 Chronic obstructive pulmonary disease, unspecified: Secondary | ICD-10-CM | POA: Diagnosis not present

## 2020-04-23 DIAGNOSIS — E785 Hyperlipidemia, unspecified: Secondary | ICD-10-CM | POA: Diagnosis not present

## 2020-04-23 DIAGNOSIS — E440 Moderate protein-calorie malnutrition: Secondary | ICD-10-CM | POA: Diagnosis not present

## 2020-04-23 DIAGNOSIS — D500 Iron deficiency anemia secondary to blood loss (chronic): Secondary | ICD-10-CM | POA: Diagnosis not present

## 2020-04-23 DIAGNOSIS — I129 Hypertensive chronic kidney disease with stage 1 through stage 4 chronic kidney disease, or unspecified chronic kidney disease: Secondary | ICD-10-CM | POA: Diagnosis not present

## 2020-04-23 DIAGNOSIS — N183 Chronic kidney disease, stage 3 unspecified: Secondary | ICD-10-CM | POA: Diagnosis not present

## 2020-04-23 DIAGNOSIS — Z8701 Personal history of pneumonia (recurrent): Secondary | ICD-10-CM | POA: Diagnosis not present

## 2020-04-23 DIAGNOSIS — K831 Obstruction of bile duct: Secondary | ICD-10-CM | POA: Diagnosis not present

## 2020-04-23 DIAGNOSIS — E538 Deficiency of other specified B group vitamins: Secondary | ICD-10-CM | POA: Diagnosis not present

## 2020-04-24 DIAGNOSIS — Z85068 Personal history of other malignant neoplasm of small intestine: Secondary | ICD-10-CM | POA: Diagnosis not present

## 2020-04-24 DIAGNOSIS — Z8701 Personal history of pneumonia (recurrent): Secondary | ICD-10-CM | POA: Diagnosis not present

## 2020-04-24 DIAGNOSIS — E440 Moderate protein-calorie malnutrition: Secondary | ICD-10-CM | POA: Diagnosis not present

## 2020-04-24 DIAGNOSIS — R1312 Dysphagia, oropharyngeal phase: Secondary | ICD-10-CM | POA: Diagnosis not present

## 2020-04-24 DIAGNOSIS — J449 Chronic obstructive pulmonary disease, unspecified: Secondary | ICD-10-CM | POA: Diagnosis not present

## 2020-04-24 DIAGNOSIS — I2510 Atherosclerotic heart disease of native coronary artery without angina pectoris: Secondary | ICD-10-CM | POA: Diagnosis not present

## 2020-04-24 DIAGNOSIS — K861 Other chronic pancreatitis: Secondary | ICD-10-CM | POA: Diagnosis not present

## 2020-04-24 DIAGNOSIS — N183 Chronic kidney disease, stage 3 unspecified: Secondary | ICD-10-CM | POA: Diagnosis not present

## 2020-04-24 DIAGNOSIS — D500 Iron deficiency anemia secondary to blood loss (chronic): Secondary | ICD-10-CM | POA: Diagnosis not present

## 2020-04-24 DIAGNOSIS — E785 Hyperlipidemia, unspecified: Secondary | ICD-10-CM | POA: Diagnosis not present

## 2020-04-24 DIAGNOSIS — E538 Deficiency of other specified B group vitamins: Secondary | ICD-10-CM | POA: Diagnosis not present

## 2020-04-24 DIAGNOSIS — I129 Hypertensive chronic kidney disease with stage 1 through stage 4 chronic kidney disease, or unspecified chronic kidney disease: Secondary | ICD-10-CM | POA: Diagnosis not present

## 2020-04-24 DIAGNOSIS — K219 Gastro-esophageal reflux disease without esophagitis: Secondary | ICD-10-CM | POA: Diagnosis not present

## 2020-04-24 DIAGNOSIS — I4892 Unspecified atrial flutter: Secondary | ICD-10-CM | POA: Diagnosis not present

## 2020-04-24 DIAGNOSIS — E1122 Type 2 diabetes mellitus with diabetic chronic kidney disease: Secondary | ICD-10-CM | POA: Diagnosis not present

## 2020-04-24 DIAGNOSIS — K831 Obstruction of bile duct: Secondary | ICD-10-CM | POA: Diagnosis not present

## 2020-04-25 ENCOUNTER — Other Ambulatory Visit: Payer: Self-pay | Admitting: *Deleted

## 2020-04-25 MED ORDER — APIXABAN 2.5 MG PO TABS: 2.5000 mg | ORAL_TABLET | Freq: Three times a day (TID) | ORAL | 6 refills | Status: DC

## 2020-04-28 ENCOUNTER — Ambulatory Visit: Payer: Medicare Other | Admitting: Family

## 2020-04-29 DIAGNOSIS — Z8679 Personal history of other diseases of the circulatory system: Secondary | ICD-10-CM | POA: Diagnosis not present

## 2020-04-29 DIAGNOSIS — I4891 Unspecified atrial fibrillation: Secondary | ICD-10-CM | POA: Diagnosis not present

## 2020-05-03 DIAGNOSIS — E785 Hyperlipidemia, unspecified: Secondary | ICD-10-CM | POA: Diagnosis not present

## 2020-05-03 DIAGNOSIS — Z85068 Personal history of other malignant neoplasm of small intestine: Secondary | ICD-10-CM | POA: Diagnosis not present

## 2020-05-03 DIAGNOSIS — E440 Moderate protein-calorie malnutrition: Secondary | ICD-10-CM | POA: Diagnosis not present

## 2020-05-03 DIAGNOSIS — J449 Chronic obstructive pulmonary disease, unspecified: Secondary | ICD-10-CM | POA: Diagnosis not present

## 2020-05-03 DIAGNOSIS — E538 Deficiency of other specified B group vitamins: Secondary | ICD-10-CM | POA: Diagnosis not present

## 2020-05-03 DIAGNOSIS — I129 Hypertensive chronic kidney disease with stage 1 through stage 4 chronic kidney disease, or unspecified chronic kidney disease: Secondary | ICD-10-CM | POA: Diagnosis not present

## 2020-05-03 DIAGNOSIS — N183 Chronic kidney disease, stage 3 unspecified: Secondary | ICD-10-CM | POA: Diagnosis not present

## 2020-05-03 DIAGNOSIS — I2510 Atherosclerotic heart disease of native coronary artery without angina pectoris: Secondary | ICD-10-CM | POA: Diagnosis not present

## 2020-05-03 DIAGNOSIS — I4892 Unspecified atrial flutter: Secondary | ICD-10-CM | POA: Diagnosis not present

## 2020-05-03 DIAGNOSIS — K831 Obstruction of bile duct: Secondary | ICD-10-CM | POA: Diagnosis not present

## 2020-05-03 DIAGNOSIS — K861 Other chronic pancreatitis: Secondary | ICD-10-CM | POA: Diagnosis not present

## 2020-05-03 DIAGNOSIS — Z8701 Personal history of pneumonia (recurrent): Secondary | ICD-10-CM | POA: Diagnosis not present

## 2020-05-03 DIAGNOSIS — E1122 Type 2 diabetes mellitus with diabetic chronic kidney disease: Secondary | ICD-10-CM | POA: Diagnosis not present

## 2020-05-03 DIAGNOSIS — R1312 Dysphagia, oropharyngeal phase: Secondary | ICD-10-CM | POA: Diagnosis not present

## 2020-05-03 DIAGNOSIS — K219 Gastro-esophageal reflux disease without esophagitis: Secondary | ICD-10-CM | POA: Diagnosis not present

## 2020-05-03 DIAGNOSIS — D500 Iron deficiency anemia secondary to blood loss (chronic): Secondary | ICD-10-CM | POA: Diagnosis not present

## 2020-05-05 NOTE — Assessment & Plan Note (Signed)
Patient is not having any angina pectoris right now

## 2020-05-05 NOTE — Assessment & Plan Note (Signed)
Patient is doing well on oxygen

## 2020-05-05 NOTE — Assessment & Plan Note (Signed)
Presently not a smoker

## 2020-05-05 NOTE — Assessment & Plan Note (Signed)
Patient has quit smoking and he is following low-cholesterol diet

## 2020-05-05 NOTE — Assessment & Plan Note (Signed)
No evidence of epistaxis or bleeding.  Patient has some bruising on the extremities.

## 2020-05-07 DIAGNOSIS — Z8701 Personal history of pneumonia (recurrent): Secondary | ICD-10-CM | POA: Diagnosis not present

## 2020-05-07 DIAGNOSIS — I4892 Unspecified atrial flutter: Secondary | ICD-10-CM | POA: Diagnosis not present

## 2020-05-07 DIAGNOSIS — K861 Other chronic pancreatitis: Secondary | ICD-10-CM | POA: Diagnosis not present

## 2020-05-07 DIAGNOSIS — I2510 Atherosclerotic heart disease of native coronary artery without angina pectoris: Secondary | ICD-10-CM | POA: Diagnosis not present

## 2020-05-07 DIAGNOSIS — E785 Hyperlipidemia, unspecified: Secondary | ICD-10-CM | POA: Diagnosis not present

## 2020-05-07 DIAGNOSIS — D500 Iron deficiency anemia secondary to blood loss (chronic): Secondary | ICD-10-CM | POA: Diagnosis not present

## 2020-05-07 DIAGNOSIS — E1122 Type 2 diabetes mellitus with diabetic chronic kidney disease: Secondary | ICD-10-CM | POA: Diagnosis not present

## 2020-05-07 DIAGNOSIS — I129 Hypertensive chronic kidney disease with stage 1 through stage 4 chronic kidney disease, or unspecified chronic kidney disease: Secondary | ICD-10-CM | POA: Diagnosis not present

## 2020-05-07 DIAGNOSIS — Z85068 Personal history of other malignant neoplasm of small intestine: Secondary | ICD-10-CM | POA: Diagnosis not present

## 2020-05-07 DIAGNOSIS — N183 Chronic kidney disease, stage 3 unspecified: Secondary | ICD-10-CM | POA: Diagnosis not present

## 2020-05-07 DIAGNOSIS — R1312 Dysphagia, oropharyngeal phase: Secondary | ICD-10-CM | POA: Diagnosis not present

## 2020-05-07 DIAGNOSIS — K219 Gastro-esophageal reflux disease without esophagitis: Secondary | ICD-10-CM | POA: Diagnosis not present

## 2020-05-07 DIAGNOSIS — E538 Deficiency of other specified B group vitamins: Secondary | ICD-10-CM | POA: Diagnosis not present

## 2020-05-07 DIAGNOSIS — J449 Chronic obstructive pulmonary disease, unspecified: Secondary | ICD-10-CM | POA: Diagnosis not present

## 2020-05-07 DIAGNOSIS — E440 Moderate protein-calorie malnutrition: Secondary | ICD-10-CM | POA: Diagnosis not present

## 2020-05-07 DIAGNOSIS — K831 Obstruction of bile duct: Secondary | ICD-10-CM | POA: Diagnosis not present

## 2020-05-12 DIAGNOSIS — I129 Hypertensive chronic kidney disease with stage 1 through stage 4 chronic kidney disease, or unspecified chronic kidney disease: Secondary | ICD-10-CM | POA: Diagnosis not present

## 2020-05-12 DIAGNOSIS — K831 Obstruction of bile duct: Secondary | ICD-10-CM | POA: Diagnosis not present

## 2020-05-12 DIAGNOSIS — E785 Hyperlipidemia, unspecified: Secondary | ICD-10-CM | POA: Diagnosis not present

## 2020-05-12 DIAGNOSIS — N183 Chronic kidney disease, stage 3 unspecified: Secondary | ICD-10-CM | POA: Diagnosis not present

## 2020-05-12 DIAGNOSIS — K861 Other chronic pancreatitis: Secondary | ICD-10-CM | POA: Diagnosis not present

## 2020-05-12 DIAGNOSIS — I2510 Atherosclerotic heart disease of native coronary artery without angina pectoris: Secondary | ICD-10-CM | POA: Diagnosis not present

## 2020-05-12 DIAGNOSIS — R1312 Dysphagia, oropharyngeal phase: Secondary | ICD-10-CM | POA: Diagnosis not present

## 2020-05-12 DIAGNOSIS — E1122 Type 2 diabetes mellitus with diabetic chronic kidney disease: Secondary | ICD-10-CM | POA: Diagnosis not present

## 2020-05-12 DIAGNOSIS — E538 Deficiency of other specified B group vitamins: Secondary | ICD-10-CM | POA: Diagnosis not present

## 2020-05-12 DIAGNOSIS — E440 Moderate protein-calorie malnutrition: Secondary | ICD-10-CM | POA: Diagnosis not present

## 2020-05-12 DIAGNOSIS — J449 Chronic obstructive pulmonary disease, unspecified: Secondary | ICD-10-CM | POA: Diagnosis not present

## 2020-05-12 DIAGNOSIS — Z8701 Personal history of pneumonia (recurrent): Secondary | ICD-10-CM | POA: Diagnosis not present

## 2020-05-12 DIAGNOSIS — D500 Iron deficiency anemia secondary to blood loss (chronic): Secondary | ICD-10-CM | POA: Diagnosis not present

## 2020-05-12 DIAGNOSIS — Z85068 Personal history of other malignant neoplasm of small intestine: Secondary | ICD-10-CM | POA: Diagnosis not present

## 2020-05-12 DIAGNOSIS — K219 Gastro-esophageal reflux disease without esophagitis: Secondary | ICD-10-CM | POA: Diagnosis not present

## 2020-05-12 DIAGNOSIS — I4892 Unspecified atrial flutter: Secondary | ICD-10-CM | POA: Diagnosis not present

## 2020-05-16 DIAGNOSIS — I129 Hypertensive chronic kidney disease with stage 1 through stage 4 chronic kidney disease, or unspecified chronic kidney disease: Secondary | ICD-10-CM | POA: Diagnosis not present

## 2020-05-16 DIAGNOSIS — N183 Chronic kidney disease, stage 3 unspecified: Secondary | ICD-10-CM | POA: Diagnosis not present

## 2020-05-16 DIAGNOSIS — I2510 Atherosclerotic heart disease of native coronary artery without angina pectoris: Secondary | ICD-10-CM | POA: Diagnosis not present

## 2020-05-16 DIAGNOSIS — K219 Gastro-esophageal reflux disease without esophagitis: Secondary | ICD-10-CM | POA: Diagnosis not present

## 2020-05-16 DIAGNOSIS — E440 Moderate protein-calorie malnutrition: Secondary | ICD-10-CM | POA: Diagnosis not present

## 2020-05-16 DIAGNOSIS — K831 Obstruction of bile duct: Secondary | ICD-10-CM | POA: Diagnosis not present

## 2020-05-16 DIAGNOSIS — Z85068 Personal history of other malignant neoplasm of small intestine: Secondary | ICD-10-CM | POA: Diagnosis not present

## 2020-05-16 DIAGNOSIS — R1312 Dysphagia, oropharyngeal phase: Secondary | ICD-10-CM | POA: Diagnosis not present

## 2020-05-16 DIAGNOSIS — K861 Other chronic pancreatitis: Secondary | ICD-10-CM | POA: Diagnosis not present

## 2020-05-16 DIAGNOSIS — J449 Chronic obstructive pulmonary disease, unspecified: Secondary | ICD-10-CM | POA: Diagnosis not present

## 2020-05-16 DIAGNOSIS — E1122 Type 2 diabetes mellitus with diabetic chronic kidney disease: Secondary | ICD-10-CM | POA: Diagnosis not present

## 2020-05-16 DIAGNOSIS — Z8701 Personal history of pneumonia (recurrent): Secondary | ICD-10-CM | POA: Diagnosis not present

## 2020-05-16 DIAGNOSIS — I4892 Unspecified atrial flutter: Secondary | ICD-10-CM | POA: Diagnosis not present

## 2020-05-16 DIAGNOSIS — E538 Deficiency of other specified B group vitamins: Secondary | ICD-10-CM | POA: Diagnosis not present

## 2020-05-16 DIAGNOSIS — D500 Iron deficiency anemia secondary to blood loss (chronic): Secondary | ICD-10-CM | POA: Diagnosis not present

## 2020-05-16 DIAGNOSIS — E785 Hyperlipidemia, unspecified: Secondary | ICD-10-CM | POA: Diagnosis not present

## 2020-05-18 ENCOUNTER — Other Ambulatory Visit: Payer: Self-pay

## 2020-05-18 ENCOUNTER — Inpatient Hospital Stay
Admission: EM | Admit: 2020-05-18 | Discharge: 2020-05-29 | DRG: 919 | Disposition: A | Discharge status: Home / Self Care | Payer: Medicare Other | Attending: Internal Medicine | Admitting: Internal Medicine

## 2020-05-18 DIAGNOSIS — K861 Other chronic pancreatitis: Secondary | ICD-10-CM | POA: Diagnosis not present

## 2020-05-18 DIAGNOSIS — D696 Thrombocytopenia, unspecified: Secondary | ICD-10-CM | POA: Diagnosis not present

## 2020-05-18 DIAGNOSIS — I483 Typical atrial flutter: Secondary | ICD-10-CM | POA: Diagnosis not present

## 2020-05-18 DIAGNOSIS — R945 Abnormal results of liver function studies: Secondary | ICD-10-CM | POA: Diagnosis not present

## 2020-05-18 DIAGNOSIS — Y92239 Unspecified place in hospital as the place of occurrence of the external cause: Secondary | ICD-10-CM | POA: Diagnosis present

## 2020-05-18 DIAGNOSIS — R6521 Severe sepsis with septic shock: Secondary | ICD-10-CM | POA: Diagnosis not present

## 2020-05-18 DIAGNOSIS — J9621 Acute and chronic respiratory failure with hypoxia: Secondary | ICD-10-CM | POA: Diagnosis not present

## 2020-05-18 DIAGNOSIS — R1084 Generalized abdominal pain: Secondary | ICD-10-CM | POA: Diagnosis not present

## 2020-05-18 DIAGNOSIS — R5381 Other malaise: Secondary | ICD-10-CM | POA: Diagnosis not present

## 2020-05-18 DIAGNOSIS — N184 Chronic kidney disease, stage 4 (severe): Secondary | ICD-10-CM | POA: Diagnosis present

## 2020-05-18 DIAGNOSIS — I10 Essential (primary) hypertension: Secondary | ICD-10-CM | POA: Diagnosis present

## 2020-05-18 DIAGNOSIS — C259 Malignant neoplasm of pancreas, unspecified: Secondary | ICD-10-CM

## 2020-05-18 DIAGNOSIS — Z6822 Body mass index (BMI) 22.0-22.9, adult: Secondary | ICD-10-CM

## 2020-05-18 DIAGNOSIS — K863 Pseudocyst of pancreas: Secondary | ICD-10-CM | POA: Diagnosis present

## 2020-05-18 DIAGNOSIS — K219 Gastro-esophageal reflux disease without esophagitis: Secondary | ICD-10-CM | POA: Diagnosis present

## 2020-05-18 DIAGNOSIS — T380X5A Adverse effect of glucocorticoids and synthetic analogues, initial encounter: Secondary | ICD-10-CM | POA: Diagnosis present

## 2020-05-18 DIAGNOSIS — N189 Chronic kidney disease, unspecified: Secondary | ICD-10-CM | POA: Diagnosis not present

## 2020-05-18 DIAGNOSIS — N179 Acute kidney failure, unspecified: Secondary | ICD-10-CM | POA: Diagnosis not present

## 2020-05-18 DIAGNOSIS — E872 Acidosis: Secondary | ICD-10-CM | POA: Diagnosis present

## 2020-05-18 DIAGNOSIS — Z452 Encounter for adjustment and management of vascular access device: Secondary | ICD-10-CM

## 2020-05-18 DIAGNOSIS — I5021 Acute systolic (congestive) heart failure: Secondary | ICD-10-CM | POA: Diagnosis not present

## 2020-05-18 DIAGNOSIS — K831 Obstruction of bile duct: Secondary | ICD-10-CM | POA: Diagnosis present

## 2020-05-18 DIAGNOSIS — K6389 Other specified diseases of intestine: Secondary | ICD-10-CM | POA: Diagnosis not present

## 2020-05-18 DIAGNOSIS — E1122 Type 2 diabetes mellitus with diabetic chronic kidney disease: Secondary | ICD-10-CM | POA: Diagnosis not present

## 2020-05-18 DIAGNOSIS — R7881 Bacteremia: Secondary | ICD-10-CM | POA: Diagnosis not present

## 2020-05-18 DIAGNOSIS — K8689 Other specified diseases of pancreas: Secondary | ICD-10-CM | POA: Diagnosis not present

## 2020-05-18 DIAGNOSIS — I5032 Chronic diastolic (congestive) heart failure: Secondary | ICD-10-CM | POA: Diagnosis present

## 2020-05-18 DIAGNOSIS — I700 Atherosclerosis of aorta: Secondary | ICD-10-CM | POA: Diagnosis present

## 2020-05-18 DIAGNOSIS — E039 Hypothyroidism, unspecified: Secondary | ICD-10-CM | POA: Diagnosis not present

## 2020-05-18 DIAGNOSIS — N170 Acute kidney failure with tubular necrosis: Secondary | ICD-10-CM | POA: Diagnosis present

## 2020-05-18 DIAGNOSIS — R109 Unspecified abdominal pain: Secondary | ICD-10-CM | POA: Diagnosis present

## 2020-05-18 DIAGNOSIS — I4891 Unspecified atrial fibrillation: Secondary | ICD-10-CM | POA: Diagnosis not present

## 2020-05-18 DIAGNOSIS — A4151 Sepsis due to Escherichia coli [E. coli]: Secondary | ICD-10-CM | POA: Diagnosis present

## 2020-05-18 DIAGNOSIS — T8579XA Infection and inflammatory reaction due to other internal prosthetic devices, implants and grafts, initial encounter: Secondary | ICD-10-CM | POA: Diagnosis not present

## 2020-05-18 DIAGNOSIS — R7401 Elevation of levels of liver transaminase levels: Secondary | ICD-10-CM | POA: Diagnosis not present

## 2020-05-18 DIAGNOSIS — K7200 Acute and subacute hepatic failure without coma: Secondary | ICD-10-CM | POA: Diagnosis not present

## 2020-05-18 DIAGNOSIS — E1151 Type 2 diabetes mellitus with diabetic peripheral angiopathy without gangrene: Secondary | ICD-10-CM | POA: Diagnosis present

## 2020-05-18 DIAGNOSIS — B9620 Unspecified Escherichia coli [E. coli] as the cause of diseases classified elsewhere: Secondary | ICD-10-CM | POA: Diagnosis not present

## 2020-05-18 DIAGNOSIS — A419 Sepsis, unspecified organism: Secondary | ICD-10-CM | POA: Diagnosis not present

## 2020-05-18 DIAGNOSIS — E43 Unspecified severe protein-calorie malnutrition: Secondary | ICD-10-CM | POA: Diagnosis not present

## 2020-05-18 DIAGNOSIS — R17 Unspecified jaundice: Secondary | ICD-10-CM | POA: Diagnosis not present

## 2020-05-18 DIAGNOSIS — E538 Deficiency of other specified B group vitamins: Secondary | ICD-10-CM | POA: Diagnosis present

## 2020-05-18 DIAGNOSIS — R9389 Abnormal findings on diagnostic imaging of other specified body structures: Secondary | ICD-10-CM

## 2020-05-18 DIAGNOSIS — R1013 Epigastric pain: Secondary | ICD-10-CM

## 2020-05-18 DIAGNOSIS — I25118 Atherosclerotic heart disease of native coronary artery with other forms of angina pectoris: Secondary | ICD-10-CM | POA: Diagnosis not present

## 2020-05-18 DIAGNOSIS — D3A8 Other benign neuroendocrine tumors: Secondary | ICD-10-CM | POA: Diagnosis not present

## 2020-05-18 DIAGNOSIS — Z7901 Long term (current) use of anticoagulants: Secondary | ICD-10-CM

## 2020-05-18 DIAGNOSIS — Z794 Long term (current) use of insulin: Secondary | ICD-10-CM

## 2020-05-18 DIAGNOSIS — IMO0002 Reserved for concepts with insufficient information to code with codable children: Secondary | ICD-10-CM | POA: Diagnosis present

## 2020-05-18 DIAGNOSIS — C7A8 Other malignant neuroendocrine tumors: Secondary | ICD-10-CM | POA: Diagnosis not present

## 2020-05-18 DIAGNOSIS — E569 Vitamin deficiency, unspecified: Secondary | ICD-10-CM | POA: Diagnosis not present

## 2020-05-18 DIAGNOSIS — I2510 Atherosclerotic heart disease of native coronary artery without angina pectoris: Secondary | ICD-10-CM | POA: Diagnosis present

## 2020-05-18 DIAGNOSIS — J449 Chronic obstructive pulmonary disease, unspecified: Secondary | ICD-10-CM | POA: Diagnosis not present

## 2020-05-18 DIAGNOSIS — J432 Centrilobular emphysema: Secondary | ICD-10-CM | POA: Diagnosis not present

## 2020-05-18 DIAGNOSIS — Z515 Encounter for palliative care: Secondary | ICD-10-CM | POA: Diagnosis not present

## 2020-05-18 DIAGNOSIS — D509 Iron deficiency anemia, unspecified: Secondary | ICD-10-CM | POA: Diagnosis present

## 2020-05-18 DIAGNOSIS — Z8249 Family history of ischemic heart disease and other diseases of the circulatory system: Secondary | ICD-10-CM

## 2020-05-18 DIAGNOSIS — J9811 Atelectasis: Secondary | ICD-10-CM | POA: Diagnosis not present

## 2020-05-18 DIAGNOSIS — B961 Klebsiella pneumoniae [K. pneumoniae] as the cause of diseases classified elsewhere: Secondary | ICD-10-CM | POA: Diagnosis not present

## 2020-05-18 DIAGNOSIS — D6959 Other secondary thrombocytopenia: Secondary | ICD-10-CM | POA: Diagnosis present

## 2020-05-18 DIAGNOSIS — K8309 Other cholangitis: Secondary | ICD-10-CM | POA: Diagnosis present

## 2020-05-18 DIAGNOSIS — Z79890 Hormone replacement therapy (postmenopausal): Secondary | ICD-10-CM

## 2020-05-18 DIAGNOSIS — G893 Neoplasm related pain (acute) (chronic): Secondary | ICD-10-CM | POA: Diagnosis not present

## 2020-05-18 DIAGNOSIS — Z9981 Dependence on supplemental oxygen: Secondary | ICD-10-CM

## 2020-05-18 DIAGNOSIS — Z66 Do not resuscitate: Secondary | ICD-10-CM | POA: Diagnosis not present

## 2020-05-18 DIAGNOSIS — K8502 Idiopathic acute pancreatitis with infected necrosis: Secondary | ICD-10-CM | POA: Diagnosis not present

## 2020-05-18 DIAGNOSIS — E785 Hyperlipidemia, unspecified: Secondary | ICD-10-CM | POA: Diagnosis not present

## 2020-05-18 DIAGNOSIS — K859 Acute pancreatitis without necrosis or infection, unspecified: Secondary | ICD-10-CM | POA: Diagnosis not present

## 2020-05-18 DIAGNOSIS — M6281 Muscle weakness (generalized): Secondary | ICD-10-CM | POA: Diagnosis not present

## 2020-05-18 DIAGNOSIS — A4159 Other Gram-negative sepsis: Secondary | ICD-10-CM | POA: Diagnosis not present

## 2020-05-18 DIAGNOSIS — I130 Hypertensive heart and chronic kidney disease with heart failure and stage 1 through stage 4 chronic kidney disease, or unspecified chronic kidney disease: Secondary | ICD-10-CM | POA: Diagnosis present

## 2020-05-18 DIAGNOSIS — D72829 Elevated white blood cell count, unspecified: Secondary | ICD-10-CM | POA: Diagnosis not present

## 2020-05-18 DIAGNOSIS — J9600 Acute respiratory failure, unspecified whether with hypoxia or hypercapnia: Secondary | ICD-10-CM | POA: Diagnosis not present

## 2020-05-18 DIAGNOSIS — Z20822 Contact with and (suspected) exposure to covid-19: Secondary | ICD-10-CM | POA: Diagnosis present

## 2020-05-18 DIAGNOSIS — R6520 Severe sepsis without septic shock: Secondary | ICD-10-CM | POA: Diagnosis not present

## 2020-05-18 DIAGNOSIS — E1165 Type 2 diabetes mellitus with hyperglycemia: Secondary | ICD-10-CM | POA: Diagnosis not present

## 2020-05-18 DIAGNOSIS — Z7189 Other specified counseling: Secondary | ICD-10-CM | POA: Diagnosis not present

## 2020-05-18 DIAGNOSIS — R279 Unspecified lack of coordination: Secondary | ICD-10-CM | POA: Diagnosis not present

## 2020-05-18 DIAGNOSIS — D638 Anemia in other chronic diseases classified elsewhere: Secondary | ICD-10-CM | POA: Diagnosis present

## 2020-05-18 DIAGNOSIS — Z79899 Other long term (current) drug therapy: Secondary | ICD-10-CM

## 2020-05-18 DIAGNOSIS — I4892 Unspecified atrial flutter: Secondary | ICD-10-CM | POA: Diagnosis not present

## 2020-05-18 DIAGNOSIS — I129 Hypertensive chronic kidney disease with stage 1 through stage 4 chronic kidney disease, or unspecified chronic kidney disease: Secondary | ICD-10-CM | POA: Diagnosis not present

## 2020-05-18 DIAGNOSIS — F17210 Nicotine dependence, cigarettes, uncomplicated: Secondary | ICD-10-CM | POA: Diagnosis present

## 2020-05-18 DIAGNOSIS — K838 Other specified diseases of biliary tract: Secondary | ICD-10-CM | POA: Diagnosis not present

## 2020-05-18 DIAGNOSIS — N1832 Chronic kidney disease, stage 3b: Secondary | ICD-10-CM | POA: Diagnosis not present

## 2020-05-18 DIAGNOSIS — E119 Type 2 diabetes mellitus without complications: Secondary | ICD-10-CM | POA: Diagnosis not present

## 2020-05-18 DIAGNOSIS — Z888 Allergy status to other drugs, medicaments and biological substances status: Secondary | ICD-10-CM

## 2020-05-18 LAB — COMPREHENSIVE METABOLIC PANEL
ALT: 149 U/L — ABNORMAL HIGH (ref 0–44)
AST: 87 U/L — ABNORMAL HIGH (ref 15–41)
Albumin: 3.2 g/dL — ABNORMAL LOW (ref 3.5–5.0)
Alkaline Phosphatase: 844 U/L — ABNORMAL HIGH (ref 38–126)
Anion gap: 12 (ref 5–15)
BUN: 36 mg/dL — ABNORMAL HIGH (ref 8–23)
CO2: 16 mmol/L — ABNORMAL LOW (ref 22–32)
Calcium: 9.1 mg/dL (ref 8.9–10.3)
Chloride: 107 mmol/L (ref 98–111)
Creatinine, Ser: 2.77 mg/dL — ABNORMAL HIGH (ref 0.61–1.24)
GFR calc Af Amer: 25 mL/min — ABNORMAL LOW (ref >60)
GFR calc non Af Amer: 21 mL/min — ABNORMAL LOW (ref >60)
Glucose, Bld: 221 mg/dL — ABNORMAL HIGH (ref 70–99)
Potassium: 3.9 mmol/L (ref 3.5–5.1)
Sodium: 135 mmol/L (ref 135–145)
Total Bilirubin: 3.5 mg/dL — ABNORMAL HIGH (ref 0.3–1.2)
Total Protein: 6.5 g/dL (ref 6.5–8.1)

## 2020-05-18 LAB — URINALYSIS, COMPLETE (UACMP) WITH MICROSCOPIC
Bilirubin Urine: NEGATIVE
Glucose, UA: NEGATIVE mg/dL
Hgb urine dipstick: NEGATIVE
Ketones, ur: 5 mg/dL — AB
Leukocytes,Ua: NEGATIVE
Nitrite: NEGATIVE
Protein, ur: 100 mg/dL — AB
Specific Gravity, Urine: 1.016 (ref 1.005–1.030)
pH: 5 (ref 5.0–8.0)

## 2020-05-18 LAB — CBC
HCT: 35.1 % — ABNORMAL LOW (ref 39.0–52.0)
Hemoglobin: 11.9 g/dL — ABNORMAL LOW (ref 13.0–17.0)
MCH: 30.6 pg (ref 26.0–34.0)
MCHC: 33.9 g/dL (ref 30.0–36.0)
MCV: 90.2 fL (ref 80.0–100.0)
Platelets: 168 10*3/uL (ref 150–400)
RBC: 3.89 MIL/uL — ABNORMAL LOW (ref 4.22–5.81)
RDW: 14.5 % (ref 11.5–15.5)
WBC: 8.8 10*3/uL (ref 4.0–10.5)
nRBC: 0 % (ref 0.0–0.2)

## 2020-05-18 LAB — LIPASE, BLOOD: Lipase: 100 U/L — ABNORMAL HIGH (ref 11–51)

## 2020-05-18 MED ORDER — OXYCODONE-ACETAMINOPHEN 5-325 MG PO TABS
1.0000 | ORAL_TABLET | ORAL | Status: DC | PRN
  Administered 2020-05-18: 1 via ORAL
  Filled 2020-05-18: qty 1

## 2020-05-18 MED ORDER — MORPHINE SULFATE (PF) 4 MG/ML IV SOLN
4.0000 mg | Freq: Once | INTRAVENOUS | Status: AC
  Administered 2020-05-18: 4 mg via INTRAVENOUS
  Filled 2020-05-18: qty 1

## 2020-05-18 MED ORDER — SODIUM CHLORIDE 0.9 % IV SOLN
1000.0000 mL | Freq: Once | INTRAVENOUS | Status: AC
  Administered 2020-05-18: 1000 mL via INTRAVENOUS

## 2020-05-18 MED ORDER — ONDANSETRON HCL 4 MG/2ML IJ SOLN
4.0000 mg | Freq: Once | INTRAMUSCULAR | Status: AC
  Administered 2020-05-18: 4 mg via INTRAVENOUS
  Filled 2020-05-18: qty 2

## 2020-05-18 MED ORDER — ONDANSETRON 4 MG PO TBDP
4.0000 mg | ORAL_TABLET | Freq: Once | ORAL | Status: AC
  Administered 2020-05-18: 4 mg via ORAL
  Filled 2020-05-18: qty 1

## 2020-05-18 NOTE — ED Provider Notes (Signed)
[facility] Emergency Department Provider Note   ____________________________________________    I have reviewed the triage vital signs and the nursing notes.   HISTORY  Chief Complaint Abdominal Pain     HPI Aldan Camey is a 74 y.o. male with a history of CKD, COPD, diabetes, pancreatic cancer, recently diagnosed who presents with epigastric abdominal pain.  Patient reports he has had nausea and vomiting, not able to tolerate p.o. all day.  He reports his pain is typical of his cancer related pain however it has been somewhat worse today.  He has been told that his pancreatic cancer is inoperable and has his first oncology appointment scheduled this week with Dr. Janese Banks.  No fevers or chills.  Has not take anything at home.  Past Medical History:  Diagnosis Date   Aortic atherosclerosis ([place])    As seen on 04/2019 CT   Atrial flutter ([place])    a. s/p ablation 2017/2018 and DCCV 2015/2016/2019 b. 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   Aortic atherosclerosis ([place]) 03/21/2020   Chronic respiratory failure with hypoxia ([place]) 03/21/2020   Thrombocytopenia, unspecified ([place]) 03/21/2020   Coronary artery disease of native artery of native heart with stable angina pectoris ([place]) 03/21/2020   Secondary hyperparathyroidism of renal origin ([place])  08/20/2019   Stage 3b chronic kidney disease 08/20/2019   Elevated serum creatinine 07/03/2019   Acute pancreatitis 11/25/2018   S/P ablation of atrial flutter 10/15/2017   Atrial flutter ([place]) 10/14/2017   Leukocytosis 11/19/2016   Erythrocytosis 11/19/2016   Diabetes type 2, uncontrolled ([place]) 09/01/2015   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[place]) 09/01/2015   CAD (coronary artery disease) 09/01/2015   Left main coronary artery disease 09/01/2015   Chronic obstructive pulmonary disease ([place]) 01/02/2015   Encounter for screening colonoscopy 01/02/2015   Typical atrial flutter ([place]) 11/27/2014   Smoking history 11/27/2014   Emphysema of lung ([place]) 11/27/2014   Pneumonia, organism unspecified(486) 11/27/2014   Essential hypertension 11/27/2014   Adrenal nodule ([place]) 06/11/2014   B12 deficiency 06/11/2014   Hypertriglyceridemia 06/11/2014   Microalbuminuria 06/11/2014   Type 2 diabetes mellitus ([place]) 06/06/2014    Past Surgical History:  Procedure Laterality Date   A-FLUTTER ABLATION N/A 10/14/2017   Procedure: A-FLUTTER ABLATION;  Surgeon: Constance Haw, MD;  Location: [place] [facility];  Service: Cardiovascular;  Laterality: N/A;   CARDIOVERSION N/A 09/08/2017   Procedure: CARDIOVERSION;  Surgeon: [name], MD;  Location: [facility];  Service: Cardiovascular;  Laterality: N/A;   CARDIOVERSION N/A 03/22/2018   Procedure: CARDIOVERSION;  Surgeon: [name], MD;  Location: [facility];  Service: Cardiovascular;  Laterality: N/A;   COLONOSCOPY     COLONOSCOPY WITH PROPOFOL N/A 10/31/2019   Procedure: COLONOSCOPY  WITH PROPOFOL;  Surgeon: Lin Landsman, MD;  Location: [facility] ENDOSCOPY;  Service: Gastroenterology;  Laterality: N/A;   ELECTROPHYSIOLOGIC STUDY N/A 10/27/2016   Procedure: A-Flutter Ablation;  Surgeon: Deboraha Sprang, MD;  Location: [place] [facility];  Service: Cardiovascular;  Laterality: N/A;   EUS N/A 12/22/2017    Procedure: FULL UPPER ENDOSCOPIC ULTRASOUND (EUS) RADIAL;  Surgeon: Reita Cliche, MD;  Location: [facility] ENDOSCOPY;  Service: Gastroenterology;  Laterality: N/A;   TEE WITH CARDIOVERSION  12/16   TEMPORARY DIALYSIS CATHETER N/A 02/08/2020   Procedure: TEMPORARY DIALYSIS CATHETER;  Surgeon: Algernon Huxley, MD;  Location: [place] [facility];  Service: Cardiovascular;  Laterality: N/A;    Prior to Admission medications   Medication Sig Start Date End Date Taking? Authorizing Provider  acetaminophen (TYLENOL) 500 MG tablet Take 500-1,000 mg by mouth every 6 (six) hours as needed for moderate pain or headache.     [provider]  albuterol (PROAIR HFA) 108 (90 Base) MCG/ACT inhaler Inhale 2 puffs into the lungs every 4 (four) hours as needed for wheezing or shortness of breath.     [provider]  amiodarone (PACERONE) 200 MG tablet Take 1 tablet (200 mg total) by mouth daily. 04/17/20   [name], MD  apixaban (ELIQUIS) 2.5 MG TABS tablet Take 1 tablet (2.5 mg total) by mouth 3 (three) times daily. 04/25/20   [name], MD  budesonide-formoterol (SYMBICORT) 80-4.5 MCG/ACT inhaler Inhale 2 puffs into the lungs 2 (two) times daily. Takes at noon and at 4pm.    [provider]  EUTHYROX 25 MCG tablet Take 1 tablet (25 mcg total) by mouth every morning. 04/17/20   [name], MD  Insulin Glargine (LANTUS SOLOSTAR) 100 UNIT/ML Solostar Pen Inject 5 Units into the skin at bedtime.  11/14/19 11/13/20  [provider]  levothyroxine (SYNTHROID, LEVOTHROID) 50 MCG tablet Take 25 mcg by mouth daily.  06/13/18   [provider]  metoprolol succinate (TOPROL-XL) 50 MG 24 hr tablet TAKE 1 TABLET BY MOUTH IN THE EVENING. TAKE WITH OR IMMEDIATELY FOLLOWING A MEAL 04/08/20   Marrianne Mood D, PA-C  midodrine (PROAMATINE) 10 MG tablet Take 1 tablet (10 mg total) by mouth 3 (three) times daily. 04/21/20   [name], MD  omeprazole (PRILOSEC) 20 MG capsule Take 1  capsule by mouth once daily 04/15/20   [name], MD  rosuvastatin (CRESTOR) 40 MG tablet Take 1 tablet (40 mg total) by mouth daily. 04/17/20   [name], MD  vitamin B-12 (CYANOCOBALAMIN) 500 MCG tablet Take 500 mcg by mouth daily.     [provider]     Allergies Levaquin [levofloxacin in d5w]  Family History  Problem Relation Age of Onset   Hypertension Father     Social History Social History   Tobacco Use   Smoking status: Light Tobacco Smoker    Years: 45.00    Types: Cigarettes    Last attempt to quit: 11/27/2012    Years since quitting: 7.4   Smokeless tobacco: Never Used  Vaping Use   Vaping Use: Never used  Substance Use Topics   Alcohol use: Yes    Alcohol/week: 2.0 standard drinks    Types: 2 Cans of beer per week    Comment: beer every once a while once a monthnone last 24hrs   Drug use: No    Review of Systems  Constitutional: No fever/chills Eyes: No visual changes.  ENT: No sore throat. Cardiovascular: Denies chest  pain. Respiratory: Denies shortness of breath. Gastrointestinal: As above Genitourinary: Negative for dysuria. Musculoskeletal: Negative for back pain. Skin: Negative for rash. Neurological: Negative for headaches   ____________________________________________   PHYSICAL EXAM:  VITAL SIGNS: ED Triage Vitals  Enc Vitals Group     BP 05/18/20 1744 (!) 142/118     Pulse Rate 05/18/20 1744 85     Resp 05/18/20 1744 18     Temp 05/18/20 1744 97.6 F (36.4 C)     Temp Source 05/18/20 1744 Oral     SpO2 05/18/20 1744 98 %     Weight 05/18/20 1745 65.8 kg (145 lb)     Height 05/18/20 1745 1.702 m (5' 7" )     Head Circumference --      Peak Flow --      Pain Score 05/18/20 1745 10     Pain Loc --      Pain Edu? --      Excl. in [place]? --     Constitutional: Alert and oriented.  Eyes: Conjunctivae are normal.  Head: Atraumatic. Nose: No congestion/rhinnorhea. Mouth/Throat: Mucous membranes are moist.     Cardiovascular: Normal rate, regular rhythm. Grossly normal heart sounds.  Good peripheral circulation. Respiratory: Normal respiratory effort.  No retractions. Lungs CTAB. Gastrointestinal: Soft, mild tenderness in the epigastrium. No distention.  No CVA tenderness.  Musculoskeletal: No lower extremity tenderness nor edema.  Warm and well perfused Neurologic:  Normal speech and language. No gross focal neurologic deficits are appreciated.  Skin:  Skin is warm, dry and intact. No rash noted. Psychiatric: Mood and affect are normal. Speech and behavior are normal.  ____________________________________________   LABS (all labs ordered are listed, but only abnormal results are displayed)  Labs Reviewed  LIPASE, BLOOD - Abnormal; Notable for the following components:      Result Value   Lipase 100 (*)    All other components within normal limits  COMPREHENSIVE METABOLIC PANEL - Abnormal; Notable for the following components:   CO2 16 (*)    Glucose, Bld 221 (*)    BUN 36 (*)    Creatinine, Ser 2.77 (*)    Albumin 3.2 (*)    AST 87 (*)    ALT 149 (*)    Alkaline Phosphatase 844 (*)    Total Bilirubin 3.5 (*)    GFR calc non Af Amer 21 (*)    GFR calc Af Amer 25 (*)    All other components within normal limits  CBC - Abnormal; Notable for the following components:   RBC 3.89 (*)    Hemoglobin 11.9 (*)    HCT 35.1 (*)    All other components within normal limits  SARS CORONAVIRUS 2 BY RT PCR (HOSPITAL ORDER, [place] [facility])  URINALYSIS, COMPLETE (UACMP) WITH MICROSCOPIC   ____________________________________________  EKG  ED ECG REPORT I, Lavonia Drafts, the attending physician, personally viewed and interpreted this ECG.  Date: 05/18/2020  Rhythm: normal sinus rhythm QRS Axis: normal Intervals: Right bundle branch block ST/T Wave abnormalities: normal Narrative Interpretation: no evidence of acute  ischemia  ____________________________________________  RADIOLOGY  Reviewed CT scan performed at [facility] on June 8: "Interval increase in size of soft tissue mass centered in the  pancreatic neck with marked pancreatic ductal dilatation and adjacent  peripancreatic soft tissue stranding, similar to prior. Peripancreatic  fluid collection has increased in size from the prior examination. " ____________________________________________   PROCEDURES  Procedure(s) performed: No  Procedures  Critical Care performed: No ____________________________________________   INITIAL IMPRESSION / ASSESSMENT AND PLAN / ED COURSE  Pertinent labs & imaging results that were available during my care of the patient were reviewed by me and considered in my medical decision making (see chart for details).  Patient with known pancreatic CA with epigastric pain.  He does have a biliary stent in place.  Presentation is consistent with cancer associated pain/his chronic pancreatitis.  Lab work is notable for elevated creatinine, elevated alk phos however these are in line with prior levels, chronically elevated lipase as well.  White blood cell count is normal.  Patient is afebrile with reassuring vitals.  We will treat with IV morphine, IV Zofran, IV fluids and reevaluate.  Patient with continued pain, will give additional IV morphine  I discussed with the hospitalist for admission    ____________________________________________   FINAL CLINICAL IMPRESSION(S) / ED DIAGNOSES  Final diagnoses:  Epigastric pain  Malignant neoplasm of pancreas, unspecified location of malignancy [facility])        Note:  This document was prepared using Dragon voice recognition software and may include unintentional dictation errors.   Lavonia Drafts, MD 05/18/20 2244

## 2020-05-18 NOTE — ED Notes (Addendum)
Pt assisted to bathroom and given medications per EDP order.  Pt back in bed, resting comfortably, call bell within reach. No other needs expressed at this time.

## 2020-05-18 NOTE — ED Notes (Signed)
Pt assisted to edge of bed to use urinal, urine specimen acquired and sent to lab. COVID swab acquired and walked to lab.  Pt resting comfortably, given pain meds per EDP order. No other needs expressed at this time.

## 2020-05-18 NOTE — ED Notes (Signed)
Pt c/o abd pain unchanged by meds given in triage. Pt states he is still nauseous, but decreased instances of emesis since given nausea medication.  NAD noted, VSS.

## 2020-05-18 NOTE — ED Triage Notes (Signed)
Pt comes POV with pancreatic cancer and abdominal pain. Pt reports that they couldn't operate on it. Lower middle belly pain. Pt also has been vomiting today.

## 2020-05-19 ENCOUNTER — Observation Stay: Payer: Medicare Other

## 2020-05-19 DIAGNOSIS — I2510 Atherosclerotic heart disease of native coronary artery without angina pectoris: Secondary | ICD-10-CM | POA: Diagnosis present

## 2020-05-19 DIAGNOSIS — A4159 Other Gram-negative sepsis: Secondary | ICD-10-CM | POA: Diagnosis present

## 2020-05-19 DIAGNOSIS — N189 Chronic kidney disease, unspecified: Secondary | ICD-10-CM | POA: Diagnosis present

## 2020-05-19 DIAGNOSIS — Z20822 Contact with and (suspected) exposure to covid-19: Secondary | ICD-10-CM | POA: Diagnosis present

## 2020-05-19 DIAGNOSIS — R6521 Severe sepsis with septic shock: Secondary | ICD-10-CM | POA: Diagnosis present

## 2020-05-19 DIAGNOSIS — R7881 Bacteremia: Secondary | ICD-10-CM | POA: Diagnosis not present

## 2020-05-19 DIAGNOSIS — Z7189 Other specified counseling: Secondary | ICD-10-CM | POA: Diagnosis not present

## 2020-05-19 DIAGNOSIS — A4151 Sepsis due to Escherichia coli [E. coli]: Secondary | ICD-10-CM | POA: Diagnosis present

## 2020-05-19 DIAGNOSIS — E43 Unspecified severe protein-calorie malnutrition: Secondary | ICD-10-CM | POA: Diagnosis present

## 2020-05-19 DIAGNOSIS — E872 Acidosis: Secondary | ICD-10-CM | POA: Diagnosis present

## 2020-05-19 DIAGNOSIS — R1084 Generalized abdominal pain: Secondary | ICD-10-CM

## 2020-05-19 DIAGNOSIS — G893 Neoplasm related pain (acute) (chronic): Secondary | ICD-10-CM | POA: Diagnosis not present

## 2020-05-19 DIAGNOSIS — T8579XA Infection and inflammatory reaction due to other internal prosthetic devices, implants and grafts, initial encounter: Secondary | ICD-10-CM | POA: Diagnosis present

## 2020-05-19 DIAGNOSIS — N184 Chronic kidney disease, stage 4 (severe): Secondary | ICD-10-CM | POA: Diagnosis present

## 2020-05-19 DIAGNOSIS — J432 Centrilobular emphysema: Secondary | ICD-10-CM | POA: Diagnosis not present

## 2020-05-19 DIAGNOSIS — I25118 Atherosclerotic heart disease of native coronary artery with other forms of angina pectoris: Secondary | ICD-10-CM | POA: Diagnosis not present

## 2020-05-19 DIAGNOSIS — D3A8 Other benign neuroendocrine tumors: Secondary | ICD-10-CM

## 2020-05-19 DIAGNOSIS — K838 Other specified diseases of biliary tract: Secondary | ICD-10-CM | POA: Diagnosis not present

## 2020-05-19 DIAGNOSIS — J9621 Acute and chronic respiratory failure with hypoxia: Secondary | ICD-10-CM | POA: Diagnosis present

## 2020-05-19 DIAGNOSIS — K8309 Other cholangitis: Secondary | ICD-10-CM | POA: Diagnosis not present

## 2020-05-19 DIAGNOSIS — I130 Hypertensive heart and chronic kidney disease with heart failure and stage 1 through stage 4 chronic kidney disease, or unspecified chronic kidney disease: Secondary | ICD-10-CM | POA: Diagnosis present

## 2020-05-19 DIAGNOSIS — R1013 Epigastric pain: Secondary | ICD-10-CM | POA: Diagnosis present

## 2020-05-19 DIAGNOSIS — K861 Other chronic pancreatitis: Secondary | ICD-10-CM | POA: Diagnosis not present

## 2020-05-19 DIAGNOSIS — I700 Atherosclerosis of aorta: Secondary | ICD-10-CM | POA: Diagnosis present

## 2020-05-19 DIAGNOSIS — K8689 Other specified diseases of pancreas: Secondary | ICD-10-CM | POA: Diagnosis not present

## 2020-05-19 DIAGNOSIS — K6389 Other specified diseases of intestine: Secondary | ICD-10-CM | POA: Diagnosis not present

## 2020-05-19 DIAGNOSIS — R109 Unspecified abdominal pain: Secondary | ICD-10-CM | POA: Diagnosis present

## 2020-05-19 DIAGNOSIS — N170 Acute kidney failure with tubular necrosis: Secondary | ICD-10-CM | POA: Diagnosis present

## 2020-05-19 DIAGNOSIS — I5032 Chronic diastolic (congestive) heart failure: Secondary | ICD-10-CM | POA: Diagnosis present

## 2020-05-19 DIAGNOSIS — K863 Pseudocyst of pancreas: Secondary | ICD-10-CM | POA: Diagnosis present

## 2020-05-19 DIAGNOSIS — I483 Typical atrial flutter: Secondary | ICD-10-CM | POA: Diagnosis present

## 2020-05-19 DIAGNOSIS — Y92239 Unspecified place in hospital as the place of occurrence of the external cause: Secondary | ICD-10-CM | POA: Diagnosis present

## 2020-05-19 DIAGNOSIS — N179 Acute kidney failure, unspecified: Secondary | ICD-10-CM

## 2020-05-19 DIAGNOSIS — I10 Essential (primary) hypertension: Secondary | ICD-10-CM

## 2020-05-19 DIAGNOSIS — R7401 Elevation of levels of liver transaminase levels: Secondary | ICD-10-CM | POA: Diagnosis present

## 2020-05-19 DIAGNOSIS — R17 Unspecified jaundice: Secondary | ICD-10-CM | POA: Diagnosis not present

## 2020-05-19 DIAGNOSIS — J449 Chronic obstructive pulmonary disease, unspecified: Secondary | ICD-10-CM | POA: Diagnosis present

## 2020-05-19 DIAGNOSIS — B961 Klebsiella pneumoniae [K. pneumoniae] as the cause of diseases classified elsewhere: Secondary | ICD-10-CM | POA: Diagnosis not present

## 2020-05-19 DIAGNOSIS — B9620 Unspecified Escherichia coli [E. coli] as the cause of diseases classified elsewhere: Secondary | ICD-10-CM | POA: Diagnosis not present

## 2020-05-19 DIAGNOSIS — E1165 Type 2 diabetes mellitus with hyperglycemia: Secondary | ICD-10-CM

## 2020-05-19 DIAGNOSIS — C259 Malignant neoplasm of pancreas, unspecified: Secondary | ICD-10-CM | POA: Diagnosis present

## 2020-05-19 DIAGNOSIS — K7200 Acute and subacute hepatic failure without coma: Secondary | ICD-10-CM | POA: Diagnosis present

## 2020-05-19 DIAGNOSIS — R6520 Severe sepsis without septic shock: Secondary | ICD-10-CM | POA: Diagnosis not present

## 2020-05-19 DIAGNOSIS — K859 Acute pancreatitis without necrosis or infection, unspecified: Secondary | ICD-10-CM | POA: Diagnosis present

## 2020-05-19 DIAGNOSIS — Z515 Encounter for palliative care: Secondary | ICD-10-CM | POA: Diagnosis not present

## 2020-05-19 DIAGNOSIS — Z66 Do not resuscitate: Secondary | ICD-10-CM | POA: Diagnosis not present

## 2020-05-19 DIAGNOSIS — K831 Obstruction of bile duct: Secondary | ICD-10-CM | POA: Diagnosis present

## 2020-05-19 LAB — CBC
HCT: 32.9 % — ABNORMAL LOW (ref 39.0–52.0)
Hemoglobin: 10.8 g/dL — ABNORMAL LOW (ref 13.0–17.0)
MCH: 29.8 pg (ref 26.0–34.0)
MCHC: 32.8 g/dL (ref 30.0–36.0)
MCV: 90.9 fL (ref 80.0–100.0)
Platelets: 106 10*3/uL — ABNORMAL LOW (ref 150–400)
RBC: 3.62 MIL/uL — ABNORMAL LOW (ref 4.22–5.81)
RDW: 14.4 % (ref 11.5–15.5)
WBC: 13.6 10*3/uL — ABNORMAL HIGH (ref 4.0–10.5)
nRBC: 0 % (ref 0.0–0.2)

## 2020-05-19 LAB — BASIC METABOLIC PANEL
Anion gap: 8 (ref 5–15)
BUN: 38 mg/dL — ABNORMAL HIGH (ref 8–23)
CO2: 20 mmol/L — ABNORMAL LOW (ref 22–32)
Calcium: 8.6 mg/dL — ABNORMAL LOW (ref 8.9–10.3)
Chloride: 111 mmol/L (ref 98–111)
Creatinine, Ser: 2.73 mg/dL — ABNORMAL HIGH (ref 0.61–1.24)
GFR calc Af Amer: 25 mL/min — ABNORMAL LOW (ref >60)
GFR calc non Af Amer: 22 mL/min — ABNORMAL LOW (ref >60)
Glucose, Bld: 154 mg/dL — ABNORMAL HIGH (ref 70–99)
Potassium: 4.2 mmol/L (ref 3.5–5.1)
Sodium: 139 mmol/L (ref 135–145)

## 2020-05-19 LAB — LIPASE, BLOOD: Lipase: 19 U/L (ref 11–51)

## 2020-05-19 LAB — GLUCOSE, CAPILLARY
Glucose-Capillary: 136 mg/dL — ABNORMAL HIGH (ref 70–99)
Glucose-Capillary: 137 mg/dL — ABNORMAL HIGH (ref 70–99)
Glucose-Capillary: 178 mg/dL — ABNORMAL HIGH (ref 70–99)
Glucose-Capillary: 172 mg/dL — ABNORMAL HIGH (ref 70–99)

## 2020-05-19 LAB — LACTIC ACID, PLASMA
Lactic Acid, Venous: 2.2 mmol/L (ref 0.5–1.9)
Lactic Acid, Venous: 2.9 mmol/L (ref 0.5–1.9)
Lactic Acid, Venous: 2.1 mmol/L (ref 0.5–1.9)
Lactic Acid, Venous: 2 mmol/L (ref 0.5–1.9)

## 2020-05-19 LAB — HEPATIC FUNCTION PANEL
ALT: 149 U/L — ABNORMAL HIGH (ref 0–44)
AST: 99 U/L — ABNORMAL HIGH (ref 15–41)
Albumin: 2.8 g/dL — ABNORMAL LOW (ref 3.5–5.0)
Alkaline Phosphatase: 757 U/L — ABNORMAL HIGH (ref 38–126)
Bilirubin, Direct: 4.1 mg/dL — ABNORMAL HIGH (ref 0.0–0.2)
Indirect Bilirubin: 1.8 mg/dL — ABNORMAL HIGH (ref 0.3–0.9)
Total Bilirubin: 5.9 mg/dL — ABNORMAL HIGH (ref 0.3–1.2)
Total Protein: 6 g/dL — ABNORMAL LOW (ref 6.5–8.1)

## 2020-05-19 LAB — FOLATE: Folate: 10.5 ng/mL (ref >5.9)

## 2020-05-19 LAB — IRON AND TIBC
Iron: 15 ug/dL — ABNORMAL LOW (ref 45–182)
Saturation Ratios: 6 % — ABNORMAL LOW (ref 17.9–39.5)
TIBC: 262 ug/dL (ref 250–450)
UIBC: 247 ug/dL

## 2020-05-19 LAB — HEMOGLOBIN A1C
Hgb A1c MFr Bld: 8.2 % — ABNORMAL HIGH (ref 4.8–5.6)
Mean Plasma Glucose: 188.64 mg/dL

## 2020-05-19 LAB — SARS CORONAVIRUS 2 BY RT PCR (HOSPITAL ORDER, PERFORMED IN ~~LOC~~ HOSPITAL LAB): SARS Coronavirus 2: NEGATIVE

## 2020-05-19 LAB — VITAMIN B12: Vitamin B-12: 1877 pg/mL — ABNORMAL HIGH (ref 180–914)

## 2020-05-19 MED ORDER — INSULIN GLARGINE 100 UNIT/ML ~~LOC~~ SOLN
8.0000 [IU] | Freq: Every day | SUBCUTANEOUS | Status: DC
  Filled 2020-05-19: qty 0.08

## 2020-05-19 MED ORDER — OCTREOTIDE ACETATE 30 MG IM KIT
30.0000 mg | PACK | Freq: Once | INTRAMUSCULAR | Status: AC
  Administered 2020-05-20: 30 mg via INTRAMUSCULAR
  Filled 2020-05-19: qty 1

## 2020-05-19 MED ORDER — SODIUM CHLORIDE 0.9 % IV BOLUS
1000.0000 mL | Freq: Once | INTRAVENOUS | Status: AC
  Administered 2020-05-19: 1000 mL via INTRAVENOUS

## 2020-05-19 MED ORDER — SODIUM CHLORIDE 0.9 % IV SOLN
510.0000 mg | Freq: Once | INTRAVENOUS | Status: AC
  Administered 2020-05-19: 09:00:00 510 mg via INTRAVENOUS
  Filled 2020-05-19: qty 17

## 2020-05-19 MED ORDER — ROSUVASTATIN CALCIUM 20 MG PO TABS
40.0000 mg | ORAL_TABLET | Freq: Every day | ORAL | Status: DC
  Administered 2020-05-19: 40 mg via ORAL
  Filled 2020-05-19: qty 2

## 2020-05-19 MED ORDER — AMIODARONE HCL 200 MG PO TABS
200.0000 mg | ORAL_TABLET | Freq: Every day | ORAL | Status: DC
  Administered 2020-05-19 – 2020-05-20 (×2): 200 mg via ORAL
  Filled 2020-05-19 (×3): qty 1

## 2020-05-19 MED ORDER — MOMETASONE FURO-FORMOTEROL FUM 100-5 MCG/ACT IN AERO
2.0000 | INHALATION_SPRAY | Freq: Two times a day (BID) | RESPIRATORY_TRACT | Status: DC
  Administered 2020-05-19 – 2020-05-29 (×16): 2 via RESPIRATORY_TRACT
  Filled 2020-05-19: qty 8.8

## 2020-05-19 MED ORDER — INSULIN GLARGINE 100 UNIT/ML ~~LOC~~ SOLN
12.0000 [IU] | Freq: Every day | SUBCUTANEOUS | Status: DC
  Administered 2020-05-19 – 2020-05-28 (×8): 12 [IU] via SUBCUTANEOUS
  Filled 2020-05-19 (×12): qty 0.12

## 2020-05-19 MED ORDER — VITAMIN B-12 1000 MCG PO TABS
500.0000 ug | ORAL_TABLET | Freq: Every day | ORAL | Status: DC
  Administered 2020-05-19 – 2020-05-22 (×3): 500 ug via ORAL
  Filled 2020-05-19 (×4): qty 1

## 2020-05-19 MED ORDER — PANTOPRAZOLE SODIUM 40 MG PO TBEC
40.0000 mg | DELAYED_RELEASE_TABLET | Freq: Every day | ORAL | Status: DC
  Administered 2020-05-19 – 2020-05-20 (×2): 40 mg via ORAL
  Filled 2020-05-19 (×2): qty 1

## 2020-05-19 MED ORDER — MIDODRINE HCL 5 MG PO TABS
10.0000 mg | ORAL_TABLET | Freq: Three times a day (TID) | ORAL | Status: DC
  Administered 2020-05-19 – 2020-05-21 (×7): 10 mg via ORAL
  Filled 2020-05-19 (×9): qty 2

## 2020-05-19 MED ORDER — IOHEXOL 9 MG/ML PO SOLN
500.0000 mL | ORAL | Status: AC
  Administered 2020-05-19 (×2): 500 mL via ORAL

## 2020-05-19 MED ORDER — ACETAMINOPHEN 500 MG PO TABS
1000.0000 mg | ORAL_TABLET | Freq: Four times a day (QID) | ORAL | Status: DC | PRN
  Administered 2020-05-20: 1000 mg via ORAL
  Filled 2020-05-19: qty 2

## 2020-05-19 MED ORDER — APIXABAN 2.5 MG PO TABS: 2.5000 mg | ORAL_TABLET | Freq: Three times a day (TID) | ORAL | Status: DC

## 2020-05-19 MED ORDER — INSULIN ASPART 100 UNIT/ML ~~LOC~~ SOLN
0.0000 [IU] | Freq: Three times a day (TID) | SUBCUTANEOUS | Status: DC
  Administered 2020-05-19: 17:00:00 2 [IU] via SUBCUTANEOUS
  Administered 2020-05-19 (×2): 1 [IU] via SUBCUTANEOUS
  Administered 2020-05-20: 18:00:00 2 [IU] via SUBCUTANEOUS
  Administered 2020-05-20: 12:00:00 1 [IU] via SUBCUTANEOUS
  Administered 2020-05-20 – 2020-05-21 (×2): 2 [IU] via SUBCUTANEOUS
  Administered 2020-05-21: 12:00:00 1 [IU] via SUBCUTANEOUS
  Filled 2020-05-19 (×8): qty 1

## 2020-05-19 MED ORDER — BOOST / RESOURCE BREEZE PO LIQD CUSTOM
1.0000 | Freq: Three times a day (TID) | ORAL | Status: DC
  Administered 2020-05-19 – 2020-05-20 (×3): 1 via ORAL

## 2020-05-19 MED ORDER — APIXABAN 5 MG PO TABS
5.0000 mg | ORAL_TABLET | Freq: Two times a day (BID) | ORAL | Status: DC
  Administered 2020-05-19 – 2020-05-20 (×4): 5 mg via ORAL
  Filled 2020-05-19 (×4): qty 1

## 2020-05-19 MED ORDER — MORPHINE SULFATE (PF) 2 MG/ML IV SOLN
1.0000 mg | INTRAVENOUS | Status: DC | PRN
  Administered 2020-05-19 – 2020-05-25 (×3): 1 mg via INTRAVENOUS
  Filled 2020-05-19 (×3): qty 1

## 2020-05-19 MED ORDER — ONDANSETRON HCL 4 MG/2ML IJ SOLN
4.0000 mg | Freq: Four times a day (QID) | INTRAMUSCULAR | Status: DC | PRN
  Administered 2020-05-19 – 2020-05-23 (×4): 4 mg via INTRAVENOUS
  Filled 2020-05-19 (×4): qty 2

## 2020-05-19 MED ORDER — LEVOTHYROXINE SODIUM 50 MCG PO TABS
25.0000 ug | ORAL_TABLET | Freq: Every morning | ORAL | Status: DC
  Administered 2020-05-19 – 2020-05-29 (×8): 25 ug via ORAL
  Filled 2020-05-19 (×8): qty 1

## 2020-05-19 MED ORDER — INSULIN GLARGINE 100 UNIT/ML ~~LOC~~ SOLN
12.0000 [IU] | Freq: Every day | SUBCUTANEOUS | Status: DC
  Filled 2020-05-19: qty 0.12

## 2020-05-19 MED ORDER — SODIUM CHLORIDE 0.9 % IV SOLN: INTRAVENOUS | Status: DC

## 2020-05-19 NOTE — H&P (Signed)
History and Physical    [name] JAS:[id number] DOB: [birth year] DOA: 05/18/2020  PCP: [name], MD  Patient coming from: Home  I have personally briefly reviewed patient's old medical records in [place]  Chief Complaint: Home  HPI: [name] is a 74 y.o. male with medical history significant for pancreatic neuroendocrine tumor, chronic pancreatitis, atrial flutter s/p ablation on Eliquis, hypertension, CAD, COPD, insulin-dependent type 2 diabetes, CKD stage IIIb who presents with worsening abdominal pain.  Patient at baseline has chronic diffuse abdominal pain due to his pancreatic cancer.  However today became acutely worse and he was also nauseous and had vomiting up to about 4 times.  Pain is constant and not necessarily just associated with food.  Pain is more to the mid umbilical and left lower quadrant.  Denies any diarrhea.  Last bowel movement was 2 days ago and denies any melena or bright red blood per rectum.  Denies any fever.  No previous history of abdominal surgery.  Denies any alcohol or tobacco use.  His pain is now about 4 out of 10 after receiving several rounds of morphine in the ED.  He recently had a CT scan at [facility] on 04/22/2020 showing history of biliary stent and an increased size of his soft tissue mass around the pancreas.  He currently has a referral to see oncology Dr. Janese Banks here at [facility].  His diagnosis of pancreatic cancer was back in 2018 and has just been under surveillance.  ED Course: He was afebrile, mildly hypertensive on room air. No leukocytosis, had new anemia of 11.9 from 3 months ago.  Lipase of 100 which is actually lower than his usual chronic pancreatitis.  Glucose of 221.  Creatinine of 2.77, AST of 87, ALT of 149, total bilirubin of 3.5.  Review of Systems:  Constitutional: No Weight Change, No Fever ENT/Mouth: No sore throat, No Rhinorrhea Eyes: No Eye Pain, No Vision Changes Cardiovascular: No Chest Pain, no  SOB Respiratory: No Cough, No Sputum Gastrointestinal: + Nausea, + Vomiting, No Diarrhea, No Constipation, + Pain Genitourinary: no Urinary Incontinenc Musculoskeletal: No Arthralgias, No Myalgias Skin: No Skin Lesions, No Pruritus, Neuro: no Weakness, No Numbness Psych: No Anxiety/Panic, No Depression,+ decrease appetite Heme/Lymph: No Bruising, No Bleeding  Past Medical History:  Diagnosis Date  . Aortic atherosclerosis ([place])    As seen on 04/2019 CT  . Atrial flutter ([place])    a. s/p ablation 2017/2018 and DCCV 2015/2016/2019 b. 2017 echo EF 60-65%, no RWMA, mild MR,  . CAD (coronary artery disease)    3v CAD 04/2019 CT  . Carcinoid tumor    metastatic  . CKD (chronic kidney disease), stage III   . COPD (chronic obstructive pulmonary disease) ([facility])    PM oxygen  . Current occasional smoker    1-2 cigarettes / week  . Diabetes mellitus type 2, insulin dependent ([place])   . Heart failure with preserved ejection fraction ([place])    2017 echo EF 60-65%, no RWMA, mild MR,    . Hyperlipidemia   . Hypertension   . Pancreatitis   . Polycythemia     Past Surgical History:  Procedure Laterality Date  . A-FLUTTER ABLATION N/A 10/14/2017   Procedure: A-FLUTTER ABLATION;  Surgeon: Constance Haw, MD;  Location: [place] [facility];  Service: Cardiovascular;  Laterality: N/A;  . CARDIOVERSION N/A 09/08/2017   Procedure: CARDIOVERSION;  Surgeon: [name], MD;  Location: [facility];  Service: Cardiovascular;  Laterality: N/A;  .  CARDIOVERSION N/A 03/22/2018   Procedure: CARDIOVERSION;  Surgeon: [name], MD;  Location: [facility];  Service: Cardiovascular;  Laterality: N/A;  . COLONOSCOPY    . COLONOSCOPY WITH PROPOFOL N/A 10/31/2019   Procedure: COLONOSCOPY WITH PROPOFOL;  Surgeon: Lin Landsman, MD;  Location: [facility] ENDOSCOPY;  Service: Gastroenterology;  Laterality: N/A;  . ELECTROPHYSIOLOGIC STUDY N/A 10/27/2016   Procedure: A-Flutter Ablation;  Surgeon: Deboraha Sprang, MD;  Location: [place] [facility];  Service: Cardiovascular;  Laterality: N/A;  . EUS N/A 12/22/2017   Procedure: FULL UPPER ENDOSCOPIC ULTRASOUND (EUS) RADIAL;  Surgeon: Reita Cliche, MD;  Location: [facility] ENDOSCOPY;  Service: Gastroenterology;  Laterality: N/A;  . TEE WITH CARDIOVERSION  12/16  . TEMPORARY DIALYSIS CATHETER N/A 02/08/2020   Procedure: TEMPORARY DIALYSIS CATHETER;  Surgeon: Algernon Huxley, MD;  Location: [place] [facility];  Service: Cardiovascular;  Laterality: N/A;     reports that he has been smoking cigarettes. He has smoked for the past 45.00 years. He has never used smokeless tobacco. He reports current alcohol use of about 2.0 standard drinks of alcohol per week. He reports that he does not use drugs.  Allergies  Allergen Reactions  . Levaquin [Levofloxacin In D5w] Swelling and Rash    Family History  Problem Relation Age of Onset  . Hypertension Father      Prior to Admission medications   Medication Sig Start Date End Date Taking? Authorizing Provider  acetaminophen (TYLENOL) 500 MG tablet Take 500-1,000 mg by mouth every 6 (six) hours as needed for moderate pain or headache.     [provider]  albuterol (PROAIR HFA) 108 (90 Base) MCG/ACT inhaler Inhale 2 puffs into the lungs every 4 (four) hours as needed for wheezing or shortness of breath.     [provider]  amiodarone (PACERONE) 200 MG tablet Take 1 tablet (200 mg total) by mouth daily. 04/17/20   [name], MD  apixaban (ELIQUIS) 2.5 MG TABS tablet Take 1 tablet (2.5 mg total) by mouth 3 (three) times daily. 04/25/20   [name], MD  budesonide-formoterol (SYMBICORT) 80-4.5 MCG/ACT inhaler Inhale 2 puffs into the lungs 2 (two) times daily. Takes at noon and at 4pm.    [provider]  EUTHYROX 25 MCG tablet Take 1 tablet (25 mcg total) by mouth every morning. 04/17/20   [name], MD  Insulin Glargine (LANTUS SOLOSTAR) 100 UNIT/ML Solostar Pen Inject 5 Units into  the skin at bedtime.  11/14/19 11/13/20  [provider]  levothyroxine (SYNTHROID, LEVOTHROID) 50 MCG tablet Take 25 mcg by mouth daily.  06/13/18   [provider]  metoprolol succinate (TOPROL-XL) 50 MG 24 hr tablet TAKE 1 TABLET BY MOUTH IN THE EVENING. TAKE WITH OR IMMEDIATELY FOLLOWING A MEAL 04/08/20   Marrianne Mood D, PA-C  midodrine (PROAMATINE) 10 MG tablet Take 1 tablet (10 mg total) by mouth 3 (three) times daily. 04/21/20   [name], MD  omeprazole (PRILOSEC) 20 MG capsule Take 1 capsule by mouth once daily 04/15/20   [name], MD  rosuvastatin (CRESTOR) 40 MG tablet Take 1 tablet (40 mg total) by mouth daily. 04/17/20   [name], MD  vitamin B-12 (CYANOCOBALAMIN) 500 MCG tablet Take 500 mcg by mouth daily.     [provider]    Physical Exam: Vitals:   05/18/20 1947 05/18/20 2000 05/18/20 2100 05/18/20 2300  BP: (!) 149/67 (!) 151/69 (!) 152/56 137/68  Pulse: (!) 57 (!) 59  63 85  Resp: 20  17 17   Temp:      TempSrc:      SpO2: 100% 100% 99% 97%  Weight:      Height:        Constitutional: NAD, calm, comfortable, elderly male laying flat in bed Vitals:   05/18/20 1947 05/18/20 2000 05/18/20 2100 05/18/20 2300  BP: (!) 149/67 (!) 151/69 (!) 152/56 137/68  Pulse: (!) 57 (!) 59 63 85  Resp: 20  17 17   Temp:      TempSrc:      SpO2: 100% 100% 99% 97%  Weight:      Height:       Eyes: PERRL, lids and conjunctivae normal ENMT: Mucous membranes are moist.  Neck: normal, supple Respiratory: clear to auscultation bilaterally, no wheezing, no crackles. Normal respiratory effort. No accessory muscle use.  Cardiovascular: Regular rate and rhythm, no murmurs / rubs / gallops. No extremity edema.  Abdomen: no tenderness although exam performed after total of 8 mg of morphine, no guarding, rigidity or rebound tenderness, no masses palpated. Bowel sounds positive.  Musculoskeletal: no clubbing / cyanosis. No joint deformity upper and lower  extremities. Good ROM, no contractures. Normal muscle tone.  Skin: no rashes, lesions, ulcers. No induration Neurologic: CN 2-12 grossly intact. Sensation intact. Strength 5/5 in all 4.  Psychiatric: Normal judgment and insight. Alert and oriented x 3. Normal mood.     Labs on Admission: I have personally reviewed following labs and imaging studies  CBC: Recent Labs  Lab 05/18/20 1747  WBC 8.8  HGB 11.9*  HCT 35.1*  MCV 90.2  PLT 740   Basic Metabolic Panel: Recent Labs  Lab 05/18/20 1747  NA 135  K 3.9  CL 107  CO2 16*  GLUCOSE 221*  BUN 36*  CREATININE 2.77*  CALCIUM 9.1   GFR: Estimated Creatinine Clearance: 21.4 mL/min (A) (by C-G formula based on SCr of 2.77 mg/dL (H)). Liver Function Tests: Recent Labs  Lab 05/18/20 1747  AST 87*  ALT 149*  ALKPHOS 844*  BILITOT 3.5*  PROT 6.5  ALBUMIN 3.2*   Recent Labs  Lab 05/18/20 1747  LIPASE 100*   No results for input(s): AMMONIA in the last 168 hours. Coagulation Profile: No results for input(s): INR, PROTIME in the last 168 hours. Cardiac Enzymes: No results for input(s): CKTOTAL, CKMB, CKMBINDEX, TROPONINI in the last 168 hours. BNP (last 3 results) No results for input(s): PROBNP in the last 8760 hours. HbA1C: No results for input(s): HGBA1C in the last 72 hours. CBG: No results for input(s): GLUCAP in the last 168 hours. Lipid Profile: No results for input(s): CHOL, HDL, LDLCALC, TRIG, CHOLHDL, LDLDIRECT in the last 72 hours. Thyroid Function Tests: No results for input(s): TSH, T4TOTAL, FREET4, T3FREE, THYROIDAB in the last 72 hours. Anemia Panel: No results for input(s): VITAMINB12, FOLATE, FERRITIN, TIBC, IRON, RETICCTPCT in the last 72 hours. Urine analysis:    Component Value Date/Time   COLORURINE AMBER (A) 05/18/2020 2330   APPEARANCEUR CLOUDY (A) 05/18/2020 2330   APPEARANCEUR Clear 10/29/2014 0909   LABSPEC 1.016 05/18/2020 2330   LABSPEC 1.017 10/29/2014 0909   PHURINE 5.0  05/18/2020 2330   GLUCOSEU NEGATIVE 05/18/2020 2330   GLUCOSEU Negative 10/29/2014 0909   HGBUR NEGATIVE 05/18/2020 2330   BILIRUBINUR NEGATIVE 05/18/2020 2330   BILIRUBINUR Negative 10/29/2014 0909   KETONESUR 5 (A) 05/18/2020 2330   PROTEINUR 100 (A) 05/18/2020 2330   NITRITE NEGATIVE 05/18/2020 2330  LEUKOCYTESUR NEGATIVE 05/18/2020 2330   LEUKOCYTESUR Negative 10/29/2014 7867    Radiological Exams on Admission: No results found.    Assessment/Plan  Worsening abdominal pain likely secondary to increasing known pancreatic neuroendocrine tumor Benign abdominal exam PRN antiemetic and pain control Agree with holding off any abdominal imaging at this time as he recently had a CT last month with increasing pancreatic tumor.  No leukocytosis or fever to suggest infection.  Normocytic anemia New from prior Check iron panel, vitamin B12 and folate  Transaminitis Stable from prior  Acute on chronic CKD stage IIIb Baseline creatinine appears to be around 2-2.5.  2.77 today. Monitor with continuous IV fluids  History of atrial flutter Continue Eliquis, amiodarone  CAD Stable.  Asymptomatic.  COPD Continue bronchodilator  Insulin-dependent type 2 diabetes Normally on 12 units of Lantus nightly Continue 12 units nightly with sensitive signs  DVT prophylaxis: Eliquis  code Status: Full Family Communication: Plan discussed with patient at bedside  disposition Plan: Home with at least 2 midnight stays  Consults called:  Admission status: inpatient  Status is: Observation  The patient remains OBS appropriate and will d/c before 2 midnights.  Dispo: The patient is from: Home              Anticipated d/c is to: Home              Anticipated d/c date is: 1 day              Patient currently is not medically stable to d/c.         Orene Desanctis DO [facility]   If 7PM-7AM, please contact night-coverage [email or web address]   05/19/2020, 12:08 AM

## 2020-05-19 NOTE — Progress Notes (Signed)
   05/19/20 2329  Assess: MEWS Score  Temp (!) 103 F (39.4 C)  BP 108/66  Pulse Rate 98  Resp 20  SpO2 98 %  Assess: MEWS Score  MEWS Temp 2  MEWS Systolic 0  MEWS Pulse 0  MEWS RR 0  MEWS LOC 0  MEWS Score 2  MEWS Score Color Yellow  Assess: if the MEWS score is Yellow or Red  Were vital signs taken at a resting state? Yes  Focused Assessment Documented focused assessment  Early Detection of Sepsis Score *See Row Information* Low  MEWS guidelines implemented *See Row Information* Yes  Treat  MEWS Interventions Other (Comment) (Notified NP)  Take Vital Signs  Increase Vital Sign Frequency  Yellow: Q 2hr X 2 then Q 4hr X 2, if remains yellow, continue Q 4hrs  Escalate  MEWS: Escalate Yellow: discuss with charge nurse/RN and consider discussing with provider and RRT  Notify: Charge Nurse/RN  Name of Charge Nurse/RN Notified Regino Schultze Rn  Date Charge Nurse/RN Notified 05/19/20  Time Charge Nurse/RN Notified 2330  Notify: Provider  Provider Name/Title Sharion Settler NP  Date Provider Notified 05/19/20  Time Provider Notified 2335  Notification Type  (secure chat)  Notification Reason Change in status  Response See new orders (waiting on  response)  Date of Provider Response 05/19/20  Time of Provider Response 2340  Notify: Rapid Response  Name of Rapid Response RN Notified  (not called)

## 2020-05-19 NOTE — Progress Notes (Signed)
Per Dr Benny Lennert said to give Bolus of fluid order after current bolus  is over.

## 2020-05-19 NOTE — Progress Notes (Signed)
Sharion Settler NP notified of lactic acid result of 2.1

## 2020-05-19 NOTE — Consult Note (Signed)
Hematology/Oncology Consult note [facility] Telephone:([phone] [phone]  Patient Care Team: [name], MD as PCP - General (Cardiology) [name], MD as PCP - Cardiology (Cardiology) [name], MD as Consulting Physician (Cardiology) Clent Jacks, RN as Registered Nurse   Name of the patient: [name]  [id number]  12/27/1945    Reason for consult: pancreatic mass- neuroendocrine versus acinar tumor   Requesting physician: Benny Lennert  Date of visit: 05/19/2020    History of presenting illness-Patient is a 74 year old male who sees me as an outpatient for metastatic small bowel carcinoid.  He has not needed any treatment for it so far.  He is known to have a mesenteric mass as well as lymph node in the mesentery which was PET avid on prior dotatate scan imaging.  He also had a 2.1 cm mass in the pancreatic head which was not avid on repeat scan and was biopsied in the past and was consistent with neuroendocrine tumor although acinar cell carcinoma was not ruled out.  We have been monitoring his small bowel and mesenteric lymph node as well as pancreatic head mass which was more or less stable until March 2021.  He was referred to [facility] for surgical management of this mass and was seen by Dr. Mariah Milling and was not deemed to be a surgical candidate.  He was referred to Dr. Leamon Arnt who recommended a trial of somatostatin analog such as octreotide or Sandostatin.  Of note patient has baseline CKD and his creatinine is typically in the 2.5 range.  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  He also underwent ERCP which showed severe localized biliary stricture and underwent sphincterotomy with balloon sphincteroplasty and brushings again showed inconclusive diagnosis-neuroendocrine tumor versus  acinar cell carcinoma or less likely adenocarcinoma.  Patient currently admitted to the hospital for worsening abdominal pain.  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  Worsening of biliary distention about the level of the stent since prior imaging in March 2021 increasing soft tissue and ductal dilation suspicious for worsening tumor with enlarging adrenal involvement on the left distention of small bowel in the proximal area with transition in the distal jejunum.  Unclear if this represents peristaltic activity following contrast for developing small bowel obstruction or ileus  Currently reports no abdominal pain at rest but has pain with eating. Also reports nausea and inability to keep food down in the last 3-4 days. Last BM yesterday  ECOG PS- 2  Pain scale- 5   Review of systems- Review of Systems  Constitutional: Positive for malaise/fatigue and weight loss. Negative for chills and fever.  HENT: Negative for congestion, ear discharge and nosebleeds.   Eyes: Negative for blurred vision.  Respiratory: Negative for cough, hemoptysis, sputum production, shortness of breath and wheezing.   Cardiovascular: Negative for chest pain, palpitations, orthopnea and claudication.  Gastrointestinal: Positive for abdominal pain and nausea. Negative for blood in stool, constipation, diarrhea, heartburn, melena and vomiting.  Genitourinary: Negative for dysuria, flank pain, frequency, hematuria and urgency.  Musculoskeletal: Negative for back pain, joint pain and myalgias.  Skin: Negative for rash.  Neurological: Negative for dizziness, tingling, focal weakness, seizures, weakness and headaches.  Endo/Heme/Allergies: Does not bruise/bleed easily.  Psychiatric/Behavioral: Negative for depression and suicidal ideas. The patient does not have insomnia.  Allergies  Allergen Reactions  . Levaquin [Levofloxacin In D5w] Swelling and Rash     Patient Active Problem List   Diagnosis Date Noted  . Abdominal pain 05/19/2020  . Acute kidney injury superimposed on chronic kidney disease ([place]) 05/19/2020  . Transaminitis 05/19/2020  . 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 Aortic atherosclerosis ([place]) 03/21/2020  . Chronic respiratory failure with hypoxia ([place]) 03/21/2020  . Thrombocytopenia, unspecified ([place]) 03/21/2020  . Coronary artery disease of native artery of native heart with stable angina pectoris ([place]) 03/21/2020  . Secondary hyperparathyroidism of renal origin ([place]) 08/20/2019  . Stage 3b chronic kidney disease 08/20/2019  . Elevated serum creatinine 07/03/2019  . Acute pancreatitis 11/25/2018  . S/P ablation of atrial flutter 10/15/2017  . Atrial flutter ([place]) 10/14/2017  . Leukocytosis 11/19/2016  . Erythrocytosis 11/19/2016  . Diabetes type 2, uncontrolled ([place]) 09/01/2015  . 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 CAD (coronary artery disease) 09/01/2015  . Left main coronary artery disease 09/01/2015  . Chronic obstructive pulmonary disease ([place]) 01/02/2015  . Encounter for screening colonoscopy 01/02/2015  . Typical atrial flutter ([place]) 11/27/2014  . Smoking history 11/27/2014  . Emphysema of lung ([place]) 11/27/2014  . Pneumonia, organism unspecified(486) 11/27/2014  . Essential hypertension 11/27/2014  . Adrenal nodule ([place]) 06/11/2014  . B12 deficiency 06/11/2014  . Hypertriglyceridemia 06/11/2014  . Microalbuminuria 06/11/2014  . Type 2 diabetes mellitus ([place]) 06/06/2014     Past Medical History:  Diagnosis Date  . Aortic atherosclerosis ([place])    As seen on 04/2019 CT  . Atrial flutter ([place])    a. s/p ablation 2017/2018 and DCCV 2015/2016/2019 b. 2017 echo EF 60-65%, no RWMA, mild MR,  . CAD (coronary artery disease)    3v CAD 04/2019 CT  . Carcinoid tumor    metastatic  . CKD (chronic kidney disease), stage III   . COPD (chronic obstructive pulmonary  disease) (H[facility])    PM oxygen  . Current occasional smoker    1-2 cigarettes / week  . Diabetes mellitus type 2, insulin dependent ([place])   . Heart failure with preserved ejection fraction ([place])    2017 echo EF 60-65%, no RWMA, mild MR,    . Hyperlipidemia   . Hypertension   . Pancreatitis   . Polycythemia      Past Surgical History:  Procedure Laterality Date  . A-FLUTTER ABLATION N/A 10/14/2017   Procedure: A-FLUTTER ABLATION;  Surgeon: Constance Haw, MD;  Location: [place] [facility] LAB;  Service: Cardiovascular;  Laterality: N/A;  . CARDIOVERSION N/A 09/08/2017   Procedure: CARDIOVERSION;  Surgeon: [name], MD;  Location: [facility] ORS;  Service: Cardiovascular;  Laterality: N/A;  . CARDIOVERSION N/A 03/22/2018   Procedure: CARDIOVERSION;  Surgeon: [name], MD;  Location: [facility] ORS;  Service: Cardiovascular;  Laterality: N/A;  . COLONOSCOPY    . COLONOSCOPY WITH PROPOFOL N/A 10/31/2019   Procedure: COLONOSCOPY WITH PROPOFOL;  Surgeon: Lin Landsman, MD;  Location[facility] ENDOSCOPY;  Service: Gastroenterology;  Laterality: N/A;  . ELECTROPHYSIOLOGIC STUDY N/A 10/27/2016   Procedure: A-Flutter Ablation;  Surgeon: Deboraha Sprang, MD;  Location: [place] CV LAB;  Service: Cardiovascular;  Laterality: N/A;  . EUS N/A 12/22/2017   Procedure: FULL UPPER ENDOSCOPIC ULTRASOUND (EUS) RADIAL;  Surgeon: Reita Cliche, MD;  Location: [facility] ENDOSCOPY;  Service: Gastroenterology;  Laterality: N/A;  . TEE WITH CARDIOVERSION  12/16  . TEMPORARY DIALYSIS CATHETER N/A 02/08/2020   Procedure: TEMPORARY DIALYSIS  CATHETER;  Surgeon: Algernon Huxley, MD;  Location: [place] [facility] LAB;  Service: Cardiovascular;  Laterality: N/A;    Social History   Socioeconomic History  . Marital status: Widowed    Spouse name: Not on file  . Number of children: Not on file  . Years of education: Not on file  . Highest education level: Not on file  Occupational History  . Not on file    Tobacco Use  . Smoking status: Light Tobacco Smoker    Years: 45.00    Types: Cigarettes    Last attempt to quit: 11/27/2012    Years since quitting: 7.4  . Smokeless tobacco: Never Used  Vaping Use  . Vaping Use: Never used  Substance and Sexual Activity  . Alcohol use: Yes    Alcohol/week: 2.0 standard drinks    Types: 2 Cans of beer per week    Comment: beer every once a while once a monthnone last 24hrs  . Drug use: No  . Sexual activity: Not on file  Other Topics Concern  . Not on file  Social History Narrative  . Not on file   Social Determinants of Health   Financial Resource Strain:   . Difficulty of Paying Living Expenses:   Food Insecurity:   . Worried About Charity fundraiser in the Last Year:   . Arboriculturist in the Last Year:   Transportation Needs:   . Film/video editor (Medical):   Marland Kitchen Lack of Transportation (Non-Medical):   Physical Activity:   . Days of Exercise per Week:   . Minutes of Exercise per Session:   Stress:   . Feeling of Stress :   Social Connections:   . Frequency of Communication with Friends and Family:   . Frequency of Social Gatherings with Friends and Family:   . Attends Religious Services:   . Active Member of Clubs or Organizations:   . Attends Archivist Meetings:   Marland Kitchen Marital Status:   Intimate Partner Violence:   . Fear of Current or Ex-Partner:   . Emotionally Abused:   Marland Kitchen Physically Abused:   . Sexually Abused:      Family History  Problem Relation Age of Onset  . Hypertension Father      Current Facility-Administered Medications:  .  amiodarone (PACERONE) tablet 200 mg, 200 mg, Oral, Daily, Tu, Ching T, DO, 200 mg at 05/19/20 0832 .  apixaban (ELIQUIS) tablet 5 mg, 5 mg, Oral, BID, Tu, Ching T, DO, 5 mg at 05/19/20 1610 .  insulin aspart (novoLOG) injection 0-9 Units, 0-9 Units, Subcutaneous, TID WC, Tu, Ching T, DO, 1 Units at 05/19/20 1414 .  insulin glargine (LANTUS) injection 12 Units, 12 Units,  Subcutaneous, QHS, Tu, Ching T, DO .  levothyroxine (SYNTHROID) tablet 25 mcg, 25 mcg, Oral, q morning - 10a, Tu, Ching T, DO, 25 mcg at 05/19/20 0601 .  midodrine (PROAMATINE) tablet 10 mg, 10 mg, Oral, TID, Tu, Ching T, DO, 10 mg at 05/19/20 9604 .  mometasone-formoterol (DULERA) 100-5 MCG/ACT inhaler 2 puff, 2 puff, Inhalation, BID, Tu, Ching T, DO, 2 puff at 05/19/20 0833 .  morphine 2 MG/ML injection 1 mg, 1 mg, Intravenous, Q2H PRN, Tu, Ching T, DO, 1 mg at 05/19/20 0232 .  ondansetron (ZOFRAN) injection 4 mg, 4 mg, Intravenous, Q6H PRN, Tu, Ching T, DO, 4 mg at 05/19/20 0831 .  pantoprazole (PROTONIX) EC tablet 40 mg, 40 mg, Oral, Daily, Tu, Ching T,  DO, 40 mg at 05/19/20 0833 .  rosuvastatin (CRESTOR) tablet 40 mg, 40 mg, Oral, Daily, Tu, Ching T, DO .  sodium chloride 0.9 % bolus 1,000 mL, 1,000 mL, Intravenous, Once, Swayze, Ava, DO .  vitamin B-12 (CYANOCOBALAMIN) tablet 500 mcg, 500 mcg, Oral, Daily, Tu, Ching T, DO, 500 mcg at 05/19/20 8676   Physical exam:  Vitals:   05/19/20 0159 05/19/20 0206 05/19/20 0804 05/19/20 1124  BP: 131/63  (!) 112/54 (!) 100/47  Pulse: 81  80 82  Resp: 20  16 16   Temp: 99.3 F (37.4 C)  99.3 F (37.4 C) 99.4 F (37.4 C)  TempSrc:   Oral Oral  SpO2: 99%  100% 100%  Weight:  140 lb 14 oz (63.9 kg)    Height:  5\' 7"  (1.702 m)     Physical Exam Constitutional:      General: He is not in acute distress. Eyes:     Comments: Scleral icterus  Cardiovascular:     Rate and Rhythm: Normal rate and regular rhythm.     Heart sounds: Normal heart sounds.  Pulmonary:     Effort: Pulmonary effort is normal.     Breath sounds: Normal breath sounds.  Abdominal:     General: Bowel sounds are normal. There is no distension.     Palpations: Abdomen is soft.     Tenderness: There is no abdominal tenderness.     Comments: BS sluggish but present  Musculoskeletal:     Cervical back: Normal range of motion.  Skin:    General: Skin is warm and dry.    Neurological:     Mental Status: He is alert and oriented to person, place, and time.        CMP Latest Ref Rng & Units 05/19/2020  Glucose 70 - 99 mg/dL 154(H)  BUN 8 - 23 mg/dL 38(H)  Creatinine 0.61 - 1.24 mg/dL 2.73(H)  Sodium 135 - 145 mmol/L 139  Potassium 3.5 - 5.1 mmol/L 4.2  Chloride 98 - 111 mmol/L 111  CO2 22 - 32 mmol/L 20(L)  Calcium 8.9 - 10.3 mg/dL 8.6(L)  Total Protein 6.5 - 8.1 g/dL 6.0(L)  Total Bilirubin 0.3 - 1.2 mg/dL 5.9(H)  Alkaline Phos 38 - 126 U/L 757(H)  AST 15 - 41 U/L 99(H)  ALT 0 - 44 U/L 149(H)   CBC Latest Ref Rng & Units 05/19/2020  WBC 4.0 - 10.5 K/uL 13.6(H)  Hemoglobin 13.0 - 17.0 g/dL 10.8(L)  Hematocrit 39 - 52 % 32.9(L)  Platelets 150 - 400 K/uL 106(L)    @IMAGES @  CT ABDOMEN PELVIS WO CONTRAST  Result Date: 05/19/2020 CLINICAL DATA:  Abdominal pain with nausea in the setting of pancreatic neuroendocrine tumor EXAM: CT ABDOMEN AND PELVIS WITHOUT CONTRAST TECHNIQUE: Multidetector CT imaging of the abdomen and pelvis was performed following the standard protocol without IV contrast. COMPARISON:  February 08, 2020 FINDINGS: Lower chest: 4 mm LEFT lower lobe pulmonary nodule along the pleural surface not present on March of 2021. No consolidation. No pleural effusion. Hepatobiliary: Post placement of biliary stent. Despite presence of stent the common bile duct is more dilated than on the previous exam measuring up to 19 mm. Previously approximately 13 mm. Masslike areas in the pancreatic head are similar. There is been interval development of low attenuation in the pancreatic neck that was not present previously approximately 15 mm (image 28, series 2) Also further dilation of the distal pancreatic duct is suggested with increase in the soft  tissue density in the mid body of the pancreas. This area measuring approximately 3.9 cm greatest thickness at the site of ductal transition where as on the prior study it measured approximately 3.5 cm. Extending  from the upper margin of the dilated duct at the site of ductal transition is a low-attenuation ovoid area measuring water density at 5 by 3.1 cm, abutting the lesser curvature. (Image 19, series 2) previously measuring approximately 3.9 x 3.0 cm in this location peripancreatic stranding is mildly increased when compared to the prior study. Soft tissue lesion associated with LEFT adrenal contiguous with with increase in size (image 21, series 2) 2.6 x 2.7 cm, previously 2.5 x 2.4 cm. Pancreas: As above Spleen: Spleen grossly normal on noncontrast imaging. Adrenals/Urinary Tract: Adrenal gland on the RIGHT is normal. LEFT adrenal mass contiguous with the pancreas enlarging as described. Mild perinephric stranding and in general anterior pararenal stranding on the LEFT. No hydronephrosis. Urinary bladder is unremarkable. Stomach/Bowel: Stomach with mild to moderate distension. Mild dilation of small bowel loops becomes moderately dilated in the mid small bowel at the level of the jejunum. There is peristaltic activity versus is transition in the distal jejunum at 3.6 cm caliber proximal to the site of narrowing with decompression of more distal small bowel loops. Colon is largely stool filled. Stool like material is present in the terminal ileum. Vascular/Lymphatic: Calcified atheromatous plaque of the abdominal aorta. No aneurysmal dilation. Adenopathy in the mesentery, largest measuring 11 mm is unchanged since the previous exam. Calcified mesenteric soft tissue (image 44, series 2) approximately 2.7 x 1.8 cm enlarged in short axis though difficult to measure given potential changes in mesenteric position over time. Reproductive: No adenopathy in the pelvis. Other: No free air.  No ascites. Musculoskeletal: No acute musculoskeletal process. Spinal degenerative changes. IMPRESSION: 1. 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 2. Worsening of biliary duct distension above the level of the stent since previous imaging from March of 2021 3. Increasing distension of small bowel in the proximal small bowel with site of transition in the distal jejunum. Is unclear whether this represents vigorous peristaltic activity following contrast administration or developing small bowel obstruction or ileus. Subsequent follow-up based on symptoms with radiographs may be helpful. 4. Increasing soft tissue and increasing ductal dilation with at least 2 sites of transition in this patient with known neuroendocrine tumor suspicious for worsening tumor with enlarging adrenal involvement on the LEFT as discussed, at the site of previous more benign appearing LEFT adrenal neoplasm now inseparable from stranding and soft tissue in the region of the pancreatic tail. 5. Given appearance it is possible that direct extension of stranding from the pancreas towards the LEFT adrenal may relate to inflammation. Attention on follow-up. 6. Extensive portosystemic collaterals in the upper abdomen compatible with splenic venous compromise, not well assessed but this secondary finding is strongly suggestive of splenic venous compromise due to worsening tumor in the region of the pancreas. Electronically Signed   By: Zetta Bills M.D.   On: 05/19/2020 12:00    Assessment and plan- Patient is a 74 y.o. male with known neuroendocrine tumor of the mesentery and pancreatic head mass (neuroendocrine versus acinar cell carcinoma) admitted for worsening abdominal pain  1. 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 I will order chromogranin A level and give him a dose of octreotide 30 mg tomorrow  Octreotide unlikely to produce immediate and significant effect. Tumor was not PET avid on dotatate scan. I have reviewed recommendations by Dr. Leamon Arnt. He is unfortunately not a surgical candidate. 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 Radiation therapy unlikely to help but I will get in touch with Dr. Baruch Gouty about this tomorrow  He will need to be discharged on oral pain medication and I will look into celiac plexus block as an outpatient for him as well  2. Abnormal LFT's- likely due to obstruction by NET. Bili was close to 3 in march 2021. Now up to 5.9. monitor daily. If trending up- consult GI if there would be any role for ERCP. He did undergo balloon sphincteroplasty in march 2021 at [facility]  3. Possible SBO seen on CT- clinically abdomen is soft/ NT/ND. But patient reports nausea and inability to take much PO. Obtain plain films tomorrow to see if there is any evolving SBO. Continue prn zofran.       Visit Diagnosis 1. Epigastric pain   2. Malignant neoplasm of pancreas, unspecified location of malignancy [facility])     Dr. Randa Evens, MD, MPH [facility] [phone] 05/19/2020 4:14 PM

## 2020-05-19 NOTE — Progress Notes (Signed)
Initial Nutrition Assessment  DOCUMENTATION CODES:   Severe malnutrition in context of chronic illness  INTERVENTION:   Boost Breeze po TID, each supplement provides 250 kcal and 9 grams of protein  Recommend Ensure Enlive TID with diet advancement.   Pt likely at moderate refeed risk   NUTRITION DIAGNOSIS:   Severe Malnutrition related to chronic illness (cancer, COPD) as evidenced by 17 percent weight loss in 7 months, moderate fat depletion, severe muscle depletion.  GOAL:   Patient will meet greater than or equal to 90% of their needs  MONITOR:   PO intake, Supplement acceptance, Labs, Weight trends, Skin, I & O's  REASON FOR ASSESSMENT:   Malnutrition Screening Tool    ASSESSMENT:   74 y.o. male with medical history significant for pancreatic neuroendocrine tumor, chronic pancreatitis, atrial flutter s/p ablation on Eliquis, hypertension, CAD, COPD, insulin-dependent type 2 diabetes, CKD stage IIIb who presents with worsening abdominal pain.   Met with pt in room today. Pt reports fair appetite and oral intake at baseline. Pt reports poor appetite and oral intake the day of his admit r/t nausea, vomiting and abdominal pain. Pt reports that he is feeling better today. Pt eating ~50% of his clear liquid meal trays. Pt does not drink supplements at home but he is willing to try them while in hospital. Pt reports weight loss pta. Per chart, pt is down 29lbs(17%) over the past 7 months; this is severe. Spoke with pt about the importance of adequate nutrition needed to preserve lean muscle. Recommended pt continue supplements after discharge. RD will add Boost Breeze; recommend Ensure Enlive TID with diet advancement.   Medications reviewed and include: insulin, synthroid, octreotide, protonix, B12  Labs reviewed: BUN 38(H), creat 2.73(H), alk phos 757(H), AST 99(H), Tbili 5.9(H) Iron 15(L), TIBC 262, folate 10.5, B12 1877 Wbc- 13.6(H), Hgb 10.8(L), Hct 32.9(L) cbgs- 136,  137, 178 x 24 hrs AIC 8.2(H)- 7/5  NUTRITION - FOCUSED PHYSICAL EXAM:    Most Recent Value  Orbital Region Mild depletion  Upper Arm Region Severe depletion  Thoracic and Lumbar Region Moderate depletion  Buccal Region Moderate depletion  Temple Region Moderate depletion  Clavicle Bone Region Severe depletion  Clavicle and Acromion Bone Region Severe depletion  Scapular Bone Region Severe depletion  Dorsal Hand Severe depletion  Patellar Region Severe depletion  Anterior Thigh Region Severe depletion  Posterior Calf Region Severe depletion  Edema (RD Assessment) None  Hair Reviewed  Eyes Reviewed  Mouth Reviewed  Skin Reviewed  Nails Reviewed     Diet Order:   Diet Order            Diet clear liquid Room service appropriate? Yes; Fluid consistency: Thin  Diet effective now               EDUCATION NEEDS:   Education needs have been addressed  Skin:  Skin Assessment: Reviewed RN Assessment (ecchymosis)  Last BM:  7/4  Height:   Ht Readings from Last 1 Encounters:  05/19/20 5' 7"  (1.702 m)    Weight:   Wt Readings from Last 1 Encounters:  05/19/20 63.9 kg    Ideal Body Weight:  67 kg  BMI:  Body mass index is 22.06 kg/m.  Estimated Nutritional Needs:   Kcal:  1700-2000kcal/day  Protein:  90-100g/day  Fluid:  >1.7L/day  [name] MS, RD, LDN Please refer to [facility] for RD and/or RD on-call/weekend/after hours pager

## 2020-05-19 NOTE — Progress Notes (Signed)
PROGRESS NOTE  [name] HQP:[id number] DOB: 09-20-1946 DOA: 05/18/2020 PCP: [name], MD  Brief History   The patient is a 74 yr old man who has known pancreatic neuroendocrine cancer. He sees oncologist Dr. Janese Banks. Dr. Janese Banks was referred this man to [facility] for surgery. He has been following up with GI there as well. He most recently had a CT abdomen and pelvis  He has chronic abdominal pain due to this cancer, but he presented here with worsening abdominal pain.   The patient is admitted to a med/surg bed for pain control. Dr. Janese Banks was contacted for consult, but deferred as she had referred the patient to [facility]. Furthermore she states that the patient occasionally gets pancreatitis on top of his pancreatic cancer. CT abdomen was performed. 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   There is also worsening of the biliary duct distension above the level of the stent since previous imaging from March 2021. Question of developing small bowel obstruction of ileus with increasing distention of the small bowel in the proximal small bowel with site of transition in the distal jejunum  There now appears to be along with increasing in size of soft tissue mass a direct extension of stranding from the pancreas towards the left adrenal, although this could also be due to inflammation.  Consultants  . Oncology  Procedures  . None  Antibiotics   Anti-infectives (From admission, onward)   None    .  Subjective  The patient states that he is still having pain, although it is improved. No new complaints.  Objective   Vitals:  Vitals:   05/19/20 0804 05/19/20 1124  BP: (!) 112/54 (!) 100/47  Pulse: 80 82  Resp: 16 16  Temp: 99.3 F (37.4 C) 99.4 F (37.4 C)  SpO2: 100% 100%   Exam:  Constitutional:  . The patient is awake, alert, and oriented x 3. No acute distress. Respiratory:  . No  increased work of breathing. . No wheezes, rales, or rhonchi . No tactile fremitus Cardiovascular:  . Regular rate and rhythm . No murmurs, ectopy, or gallups. . No lateral PMI. No thrills. Abdomen:  . Abdomen is soft and slightly distended . Abdomen is tender in the epigastrum . No hernias, masses, or organomegaly . There are tinkling bowel sounds.  Musculoskeletal:  . No cyanosis, clubbing, or edema Skin:  . No rashes, lesions, ulcers . palpation of skin: no induration or nodules Neurologic:  . CN 2-12 intact . Sensation all 4 extremities intact Psychiatric:  . Mental status o Mood, affect appropriate o Orientation to person, place, time  . judgment and insight appear intact  I have personally reviewed the following:   Today's Data  . CBC, CMP, Iron studies, Lactic acid . Orthoptist  . Blood cultures x 2  Imaging  . CT abdomen and pelvis  Scheduled Meds: . amiodarone  200 mg Oral Daily  . apixaban  5 mg Oral BID  . insulin aspart  0-9 Units Subcutaneous TID WC  . insulin glargine  12 Units Subcutaneous QHS  . levothyroxine  25 mcg Oral q morning - 10a  . midodrine  10 mg Oral TID  . mometasone-formoterol  2 puff Inhalation BID  . pantoprazole  40 mg Oral Daily  . rosuvastatin  40 mg Oral Daily  . vitamin B-12  500 mcg Oral Daily   Continuous Infusions: .  sodium chloride      Principal Problem:   Abdominal pain Active Problems:   Typical atrial flutter (HCC)   Essential hypertension   Chronic obstructive pulmonary disease (HCC)   Diabetes type 2, uncontrolled (HCC)   CAD (coronary artery disease)   Acute kidney injury superimposed on chronic kidney disease (HCC)   Transaminitis   LOS: 0 days    A & P  Acute pancreatitis in the setting of neuroendocrine pancreatic cancer. Clear liquid diet, pain control.  Partial small bowel obstruction: Possible transition zone in the small bowel. Tinkling bowel sounds. However, the patient is tolerating  diet and did have a BM last night. Monitor closely.  Neuroendocrine tumor with increasing size of soft tissue mass with stranding towards left adrenal gland due to either direct extension or inflammation. Refer back to [facility] once acute illness is resolved.  Lactic acidosis: Sepsis protocol with 2 L bolus of NS thus far. Blood cultures x 2 have been ordered.   Elevated LFT's: However, T Bili is down from last lab on 02/08/2020. AST and ALT are slightly elevated. Monitor. Pancreatic duct is somewhat larger in CT than previous.  DM II: Monitor glucose with FSBS and SSI. Glucoses in the last 24 hours were 136-149.  Anemia: Due to chronic illness and iron deficiency. Feraheme ordered today.  I have seen and examined this patient myself. I have spent 35 minutes in his evaluation and car.  DVT Prophylaxis: Continue Eliquis CODE STATUS: Full Code Family Communication: None available Disposition: The patient is from home. Anticipate discharge to home. Barriers to discharge: Acute pancreatitis, lactic acidosis, and partial SBO.  Status is: Inpatient  Remains inpatient appropriate because:Inpatient level of care appropriate due to severity of illness   Dispo: The patient is from: Home              Anticipated d/c is to: Home              Anticipated d/c date is: 2 days              Patient currently is not medically stable to d/c.   [name], DO [facility] Direct contact: see [email or web address]  7PM-7AM contact night coverage as above 05/19/2020, 1:38 PM  LOS: 0 days

## 2020-05-20 DIAGNOSIS — B9620 Unspecified Escherichia coli [E. coli] as the cause of diseases classified elsewhere: Secondary | ICD-10-CM

## 2020-05-20 DIAGNOSIS — K831 Obstruction of bile duct: Secondary | ICD-10-CM

## 2020-05-20 DIAGNOSIS — B961 Klebsiella pneumoniae [K. pneumoniae] as the cause of diseases classified elsewhere: Secondary | ICD-10-CM

## 2020-05-20 DIAGNOSIS — R7881 Bacteremia: Secondary | ICD-10-CM

## 2020-05-20 DIAGNOSIS — K8309 Other cholangitis: Secondary | ICD-10-CM

## 2020-05-20 DIAGNOSIS — K863 Pseudocyst of pancreas: Secondary | ICD-10-CM

## 2020-05-20 DIAGNOSIS — D72829 Elevated white blood cell count, unspecified: Secondary | ICD-10-CM

## 2020-05-20 DIAGNOSIS — K859 Acute pancreatitis without necrosis or infection, unspecified: Secondary | ICD-10-CM

## 2020-05-20 DIAGNOSIS — D696 Thrombocytopenia, unspecified: Secondary | ICD-10-CM

## 2020-05-20 DIAGNOSIS — C7A8 Other malignant neuroendocrine tumors: Secondary | ICD-10-CM

## 2020-05-20 LAB — HEPATIC FUNCTION PANEL
ALT: 130 U/L — ABNORMAL HIGH (ref 0–44)
AST: 94 U/L — ABNORMAL HIGH (ref 15–41)
Albumin: 2 g/dL — ABNORMAL LOW (ref 3.5–5.0)
Alkaline Phosphatase: 591 U/L — ABNORMAL HIGH (ref 38–126)
Bilirubin, Direct: 4.3 mg/dL — ABNORMAL HIGH (ref 0.0–0.2)
Indirect Bilirubin: 1.8 mg/dL — ABNORMAL HIGH (ref 0.3–0.9)
Total Bilirubin: 6.1 mg/dL — ABNORMAL HIGH (ref 0.3–1.2)
Total Protein: 4.5 g/dL — ABNORMAL LOW (ref 6.5–8.1)

## 2020-05-20 LAB — CBC
HCT: 30.5 % — ABNORMAL LOW (ref 39.0–52.0)
Hemoglobin: 10 g/dL — ABNORMAL LOW (ref 13.0–17.0)
MCH: 29.9 pg (ref 26.0–34.0)
MCHC: 32.8 g/dL (ref 30.0–36.0)
MCV: 91 fL (ref 80.0–100.0)
Platelets: 49 10*3/uL — ABNORMAL LOW (ref 150–400)
RBC: 3.35 MIL/uL — ABNORMAL LOW (ref 4.22–5.81)
RDW: 14.9 % (ref 11.5–15.5)
WBC: 20.2 10*3/uL — ABNORMAL HIGH (ref 4.0–10.5)
nRBC: 0 % (ref 0.0–0.2)

## 2020-05-20 LAB — BLOOD CULTURE ID PANEL (REFLEXED)
Acinetobacter baumannii: NOT DETECTED
Candida albicans: NOT DETECTED
Candida glabrata: NOT DETECTED
Candida krusei: NOT DETECTED
Candida parapsilosis: NOT DETECTED
Candida tropicalis: NOT DETECTED
Carbapenem resistance: NOT DETECTED
Enterobacter cloacae complex: NOT DETECTED
Enterobacteriaceae species: DETECTED — AB
Enterococcus species: NOT DETECTED
Escherichia coli: DETECTED — AB
Haemophilus influenzae: NOT DETECTED
Klebsiella oxytoca: NOT DETECTED
Klebsiella pneumoniae: DETECTED — AB
Listeria monocytogenes: NOT DETECTED
Neisseria meningitidis: NOT DETECTED
Proteus species: NOT DETECTED
Pseudomonas aeruginosa: NOT DETECTED
Serratia marcescens: NOT DETECTED
Staphylococcus aureus (BCID): NOT DETECTED
Staphylococcus species: NOT DETECTED
Streptococcus agalactiae: NOT DETECTED
Streptococcus pneumoniae: NOT DETECTED
Streptococcus pyogenes: NOT DETECTED
Streptococcus species: NOT DETECTED

## 2020-05-20 LAB — GLUCOSE, CAPILLARY
Glucose-Capillary: 168 mg/dL — ABNORMAL HIGH (ref 70–99)
Glucose-Capillary: 147 mg/dL — ABNORMAL HIGH (ref 70–99)
Glucose-Capillary: 156 mg/dL — ABNORMAL HIGH (ref 70–99)
Glucose-Capillary: 103 mg/dL — ABNORMAL HIGH (ref 70–99)

## 2020-05-20 LAB — BASIC METABOLIC PANEL
Anion gap: 9 (ref 5–15)
BUN: 46 mg/dL — ABNORMAL HIGH (ref 8–23)
CO2: 16 mmol/L — ABNORMAL LOW (ref 22–32)
Calcium: 8.1 mg/dL — ABNORMAL LOW (ref 8.9–10.3)
Chloride: 108 mmol/L (ref 98–111)
Creatinine, Ser: 3.25 mg/dL — ABNORMAL HIGH (ref 0.61–1.24)
GFR calc Af Amer: 20 mL/min — ABNORMAL LOW (ref >60)
GFR calc non Af Amer: 18 mL/min — ABNORMAL LOW (ref >60)
Glucose, Bld: 154 mg/dL — ABNORMAL HIGH (ref 70–99)
Potassium: 3.3 mmol/L — ABNORMAL LOW (ref 3.5–5.1)
Sodium: 133 mmol/L — ABNORMAL LOW (ref 135–145)

## 2020-05-20 MED ORDER — ROSUVASTATIN CALCIUM 10 MG PO TABS
10.0000 mg | ORAL_TABLET | Freq: Every day | ORAL | Status: DC
  Administered 2020-05-20: 10 mg via ORAL
  Filled 2020-05-20: qty 1

## 2020-05-20 MED ORDER — SODIUM CHLORIDE 0.9 % IV BOLUS
250.0000 mL | Freq: Once | INTRAVENOUS | Status: AC
  Administered 2020-05-20: 250 mL via INTRAVENOUS

## 2020-05-20 MED ORDER — SODIUM CHLORIDE 0.9 % IV SOLN: INTRAVENOUS | Status: DC

## 2020-05-20 MED ORDER — SODIUM CHLORIDE 0.9 % IV SOLN
INTRAVENOUS | Status: DC | PRN
  Administered 2020-05-20 – 2020-05-26 (×3): 250 mL via INTRAVENOUS

## 2020-05-20 MED ORDER — SODIUM CHLORIDE 0.9 % IV SOLN
1.0000 g | Freq: Two times a day (BID) | INTRAVENOUS | Status: DC
  Administered 2020-05-20 – 2020-05-23 (×6): 1 g via INTRAVENOUS
  Filled 2020-05-20 (×8): qty 1

## 2020-05-20 MED ORDER — ALBUMIN HUMAN 25 % IV SOLN
25.0000 g | Freq: Once | INTRAVENOUS | Status: AC
  Administered 2020-05-20: 25 g via INTRAVENOUS
  Filled 2020-05-20: qty 100

## 2020-05-20 MED ORDER — SODIUM CHLORIDE 0.9 % IV BOLUS
1000.0000 mL | Freq: Once | INTRAVENOUS | Status: AC
  Administered 2020-05-20: 09:00:00 1000 mL via INTRAVENOUS

## 2020-05-20 NOTE — Progress Notes (Signed)
PROGRESS NOTE  [name] YQM:[id number] DOB: Nov 28, 1945 DOA: 05/18/2020 PCP: Carlos Athens, MD  Brief History   The patient is a 74 yr old man who has known pancreatic neuroendocrine cancer. He sees oncologist Dr. Janese Galvan. Dr. Janese Galvan was referred this man to [facility] for surgery. He has been following up with GI there as well. He most recently had a CT abdomen and pelvis  He has chronic abdominal pain due to this cancer, but he presented here with worsening abdominal pain.   The patient is admitted to a med/surg bed for pain control. Dr. Janese Galvan was contacted for consult, but deferred as she had referred the patient to [facility]. Furthermore she states that the patient occasionally gets pancreatitis on top of his pancreatic cancer. CT abdomen was performed. 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   There is also worsening of the biliary duct distension above the level of the stent since previous imaging from March 2021. Question of developing small bowel obstruction of ileus with increasing distention of the small bowel in the proximal small bowel with site of transition in the distal jejunum  There now appears to be along with increasing in size of soft tissue mass a direct extension of stranding from the pancreas towards the left adrenal, although this could also be due to inflammation.  Consultants  . Oncology . Gastroenterology . Infectious disease  Procedures  . None  Antibiotics   Anti-infectives (From admission, onward)   Start     Dose/Rate Route Frequency Ordered Stop   05/20/20 0600  meropenem (MERREM) 1 g in sodium chloride 0.9 % 100 mL IVPB     Discontinue     1 g 200 mL/hr over 30 Minutes Intravenous Every 12 hours 05/20/20 0344       Subjective  The patient states that he is feeling better. He wants to advance his diet. No BM since day before yesterday, but he denies pain/nausea with  taking clears.  Objective   Vitals:  Vitals:   05/20/20 1005 05/20/20 1212  BP: (!) 76/51 (!) 87/54  Pulse:  62  Resp:  16  Temp:  97.9 F (36.6 C)  SpO2:  100%   Exam:  Constitutional:  . The patient is awake, alert, and oriented x 3. No acute distress. Respiratory:  . No increased work of breathing. . No wheezes, rales, or rhonchi . No tactile fremitus Cardiovascular:  . Regular rate and rhythm . No murmurs, ectopy, or gallups. . No lateral PMI. No thrills. Abdomen:  . Abdomen is soft and distended . Abdomen is not tender in the epigastrum today . No hernias, masses, or organomegaly . Bowel sounds are normoactive Musculoskeletal:  . No cyanosis, clubbing, or edema Skin:  . No rashes, lesions, ulcers . palpation of skin: no induration or nodules Neurologic:  . CN 2-12 intact . Sensation all 4 extremities intact Psychiatric:  . Mental status o Mood, affect appropriate o Orientation to person, place, time  . judgment and insight appear intact  I have personally reviewed the following:   Today's Data  . CBC, CMP, Iron studies, Lactic acid . Orthoptist  . Blood cultures x 2: 1/2 positive for E. Coli, Klebsiella oxytoca, and enterobacteriaceae  Imaging  . CT abdomen and pelvis  Scheduled Meds: . amiodarone  200 mg Oral Daily  . feeding supplement  1 Container Oral TID BM  . insulin aspart  0-9 Units Subcutaneous TID WC  . insulin glargine  12 Units Subcutaneous QHS  . levothyroxine  25 mcg Oral q morning - 10a  . midodrine  10 mg Oral TID  . mometasone-formoterol  2 puff Inhalation BID  . octreotide  30 mg Intramuscular Once  . pantoprazole  40 mg Oral Daily  . rosuvastatin  10 mg Oral Daily  . vitamin B-12  500 mcg Oral Daily   Continuous Infusions: . sodium chloride 250 mL (05/20/20 0733)  . meropenem (MERREM) IV 1 g (05/20/20 0734)    Principal Problem:   Abdominal pain Active Problems:   Typical atrial flutter (HCC)   Essential  hypertension   Chronic obstructive pulmonary disease (HCC)   Diabetes type 2, uncontrolled (HCC)   CAD (coronary artery disease)   Acute kidney injury superimposed on chronic kidney disease (HCC)   Transaminitis   Neoplasm related pain   Primary pancreatic neuroendocrine tumor   Protein-calorie malnutrition, severe   LOS: 1 day    A & P  Acute pancreatitis in the setting of neuroendocrine pancreatic cancer. Clear liquid diet, pain control.  Partial small bowel obstruction: Possible transition zone in the small bowel. Tinkling bowel sounds. However, the patient is tolerating diet and did have a BM last night. Monitor closely.  Bacteremia: Klebsiella, enterobacteriaceae, and E. Coli. Consult ID.  Neuroendocrine tumor with increasing size of soft tissue mass with stranding towards left adrenal gland due to either direct extension or inflammation. Refer back to [facility] once acute illness is resolved.  Lactic acidosis: Sepsis protocol with 2 L bolus of NS thus far. Blood cultures x 2 have been ordered.   Elevated LFT's: However, T Bili is down from last lab on 02/08/2020. AST and ALT are slightly elevated. Monitor. Pancreatic duct is somewhat larger in CT than previous.  DM II: Monitor glucose with FSBS and SSI. Glucoses in the last 24 hours were 137-168.  Anemia: Due to chronic illness and iron deficiency. Feraheme ordered today.  I have seen and examined this patient myself. I have spent 36 minutes in his evaluation and car.  DVT Prophylaxis: Continue Eliquis CODE STATUS: Full Code Family Communication: None available Disposition: The patient is from home. Anticipate discharge to home. Barriers to discharge: Acute pancreatitis, lactic acidosis, and partial SBO.  Status is: Inpatient  Remains inpatient appropriate because:Inpatient level of care appropriate due to severity of illness   Dispo: The patient is from: Home              Anticipated d/c is to: Home               Anticipated d/c date is: 2 days              Patient currently is not medically stable to d/c.   Carlos Goede, DO [facility] Direct contact: see [email or web address]  7PM-7AM contact night coverage as above 05/20/2020, 1:11 PM  LOS: 0 days

## 2020-05-20 NOTE — Progress Notes (Signed)
Hematology/Oncology Consult note [facility]  [phone] [phone]  Patient Care Team: [name], MD as PCP - General (Cardiology) [name], MD as PCP - Cardiology (Cardiology) [name], MD as Consulting Physician (Cardiology) Clent Jacks, RN as Registered Nurse   Name of the patient: [name]  [id number]  19-Sep-1946   Date of visit: 05/20/2020   Interval history-still reports having abdominal pain.  Last bowel movement was 2 days ago.  Also had a fever overnight with a T-max of 103.  Blood pressures have been running soft with systolic blood pressures between 70s to 80s   Review of systems- Review of Systems  Constitutional: Positive for fever and malaise/fatigue. Negative for chills and weight loss.  HENT: Negative for congestion, ear discharge and nosebleeds.   Eyes: Negative for blurred vision.  Respiratory: Negative for cough, hemoptysis, sputum production, shortness of breath and wheezing.   Cardiovascular: Negative for chest pain, palpitations, orthopnea and claudication.  Gastrointestinal: Positive for abdominal pain. Negative for blood in stool, constipation, diarrhea, heartburn, melena, nausea and vomiting.  Genitourinary: Negative for dysuria, flank pain, frequency, hematuria and urgency.  Musculoskeletal: Negative for back pain, joint pain and myalgias.  Skin: Negative for rash.  Neurological: Negative for dizziness, tingling, focal weakness, seizures, weakness and headaches.  Endo/Heme/Allergies: Does not bruise/bleed easily.  Psychiatric/Behavioral: Negative for depression and suicidal ideas. The patient does not have insomnia.      Allergies  Allergen Reactions  . Levaquin [Levofloxacin In D5w] Swelling and Rash     Past Medical History:  Diagnosis Date  . Aortic atherosclerosis ([place])    As seen on 04/2019 CT  . Atrial flutter ([place])    a. s/p ablation 2017/2018 and DCCV  2015/2016/2019 b. 2017 echo EF 60-65%, no RWMA, mild MR,  . CAD (coronary artery disease)    3v CAD 04/2019 CT  . Carcinoid tumor    metastatic  . CKD (chronic kidney disease), stage III   . COPD (chronic obstructive pulmonary disease) (H[facility])    PM oxygen  . Current occasional smoker    1-2 cigarettes / week  . Diabetes mellitus type 2, insulin dependent ([place])   . Heart failure with preserved ejection fraction ([place])    2017 echo EF 60-65%, no RWMA, mild MR,    . Hyperlipidemia   . Hypertension   . Pancreatitis   . Polycythemia      Past Surgical History:  Procedure Laterality Date  . A-FLUTTER ABLATION N/A 10/14/2017   Procedure: A-FLUTTER ABLATION;  Surgeon: Constance Haw, MD;  Location: [place] [facility];  Service: Cardiovascular;  Laterality: N/A;  . CARDIOVERSION N/A 09/08/2017   Procedure: CARDIOVERSION;  Surgeon: [name], MD;  Location: [facility];  Service: Cardiovascular;  Laterality: N/A;  . CARDIOVERSION N/A 03/22/2018   Procedure: CARDIOVERSION;  Surgeon: [name], MD;  Location: [facility];  Service: Cardiovascular;  Laterality: N/A;  . COLONOSCOPY    . COLONOSCOPY WITH PROPOFOL N/A 10/31/2019   Procedure: COLONOSCOPY WITH PROPOFOL;  Surgeon: Lin Landsman, MD;  Location: [facility];  Service: Gastroenterology;  Laterality: N/A;  . ELECTROPHYSIOLOGIC STUDY N/A 10/27/2016   Procedure: A-Flutter Ablation;  Surgeon: Deboraha Sprang, MD;  Location: [place] [facility];  Service: Cardiovascular;  Laterality: N/A;  . EUS N/A 12/22/2017   Procedure: FULL UPPER ENDOSCOPIC ULTRASOUND (EUS) RADIAL;  Surgeon: Reita Cliche, MD;  Location: [facility];  Service: Gastroenterology;  Laterality: N/A;  .  TEE WITH CARDIOVERSION  12/16  . TEMPORARY DIALYSIS CATHETER N/A 02/08/2020   Procedure: TEMPORARY DIALYSIS CATHETER;  Surgeon: Algernon Huxley, MD;  Location: [place] [facility];  Service: Cardiovascular;  Laterality: N/A;    Social History    Socioeconomic History  . Marital status: Widowed    Spouse name: Not on file  . Number of children: Not on file  . Years of education: Not on file  . Highest education level: Not on file  Occupational History  . Not on file  Tobacco Use  . Smoking status: Light Tobacco Smoker    Years: 45.00    Types: Cigarettes    Last attempt to quit: 11/27/2012    Years since quitting: 7.4  . Smokeless tobacco: Never Used  Vaping Use  . Vaping Use: Never used  Substance and Sexual Activity  . Alcohol use: Yes    Alcohol/week: 2.0 standard drinks    Types: 2 Cans of beer per week    Comment: beer every once a while once a monthnone last 24hrs  . Drug use: No  . Sexual activity: Not on file  Other Topics Concern  . Not on file  Social History Narrative  . Not on file   Social Determinants of Health   Financial Resource Strain:   . Difficulty of Paying Living Expenses:   Food Insecurity:   . Worried About Charity fundraiser in the Last Year:   . Arboriculturist in the Last Year:   Transportation Needs:   . Film/video editor (Medical):   Marland Kitchen Lack of Transportation (Non-Medical):   Physical Activity:   . Days of Exercise per Week:   . Minutes of Exercise per Session:   Stress:   . Feeling of Stress :   Social Connections:   . Frequency of Communication with Friends and Family:   . Frequency of Social Gatherings with Friends and Family:   . Attends Religious Services:   . Active Member of Clubs or Organizations:   . Attends Archivist Meetings:   Marland Kitchen Marital Status:   Intimate Partner Violence:   . Fear of Current or Ex-Partner:   . Emotionally Abused:   Marland Kitchen Physically Abused:   . Sexually Abused:     Family History  Problem Relation Age of Onset  . Hypertension Father      Current Facility-Administered Medications:  .  0.9 %  sodium chloride infusion, , Intravenous, PRN, Swayze, Ava, DO, Stopped at 05/20/20 0919 .  0.9 %  sodium chloride infusion, ,  Intravenous, Continuous, Swayze, Ava, DO, Last Rate: 125 mL/hr at 05/20/20 1637, New Bag at 05/20/20 1637 .  acetaminophen (TYLENOL) tablet 1,000 mg, 1,000 mg, Oral, Q6H PRN, Sharion Settler, NP, 1,000 mg at 05/20/20 0000 .  amiodarone (PACERONE) tablet 200 mg, 200 mg, Oral, Daily, Tu, Ching T, DO, 200 mg at 05/20/20 0825 .  feeding supplement (BOOST / RESOURCE BREEZE) liquid 1 Container, 1 Container, Oral, TID BM, Swayze, Ava, DO, 1 Container at 05/20/20 1214 .  insulin aspart (novoLOG) injection 0-9 Units, 0-9 Units, Subcutaneous, TID WC, Tu, Ching T, DO, 1 Units at 05/20/20 1214 .  insulin glargine (LANTUS) injection 12 Units, 12 Units, Subcutaneous, QHS, Tu, Ching T, DO, 12 Units at 05/19/20 2123 .  levothyroxine (SYNTHROID) tablet 25 mcg, 25 mcg, Oral, q morning - 10a, Tu, Ching T, DO, 25 mcg at 05/20/20 0705 .  meropenem (MERREM) 1 g in sodium chloride 0.9 % 100  mL IVPB, 1 g, Intravenous, Q12H, Ena Dawley, RPH, Stopped at 05/20/20 8413 .  midodrine (PROAMATINE) tablet 10 mg, 10 mg, Oral, TID, Tu, Ching T, DO, 10 mg at 05/20/20 1600 .  mometasone-formoterol (DULERA) 100-5 MCG/ACT inhaler 2 puff, 2 puff, Inhalation, BID, Tu, Ching T, DO, 2 puff at 05/20/20 0828 .  morphine 2 MG/ML injection 1 mg, 1 mg, Intravenous, Q2H PRN, Tu, Ching T, DO, 1 mg at 05/20/20 1613 .  ondansetron (ZOFRAN) injection 4 mg, 4 mg, Intravenous, Q6H PRN, Tu, Ching T, DO, 4 mg at 05/20/20 1340 .  pantoprazole (PROTONIX) EC tablet 40 mg, 40 mg, Oral, Daily, Tu, Ching T, DO, 40 mg at 05/20/20 0825 .  rosuvastatin (CRESTOR) tablet 10 mg, 10 mg, Oral, Daily, Swayze, Ava, DO .  vitamin B-12 (CYANOCOBALAMIN) tablet 500 mcg, 500 mcg, Oral, Daily, Tu, Ching T, DO, 500 mcg at 05/20/20 0825  Physical exam:  Vitals:   05/20/20 0959 05/20/20 1005 05/20/20 1212 05/20/20 1553  BP: (!) 72/48 (!) 76/51 (!) 87/54 108/66  Pulse: 64  62 74  Resp:   16   Temp:   97.9 F (36.6 C)   TempSrc:   Oral   SpO2:   100%   Weight:        Height:       Physical Exam Constitutional:      Comments: Appears frail and fatigued  Eyes:     Pupils: Pupils are equal, round, and reactive to light.  Cardiovascular:     Rate and Rhythm: Tachycardia present.     Heart sounds: Normal heart sounds.  Pulmonary:     Effort: Pulmonary effort is normal.  Abdominal:     General: There is no distension.     Palpations: Abdomen is soft.     Comments: Mild TTP  Musculoskeletal:     Cervical back: Normal range of motion.  Skin:    General: Skin is warm and dry.  Neurological:     Mental Status: He is alert and oriented to person, place, and time.      CMP Latest Ref Rng & Units 05/20/2020  Glucose 70 - 99 mg/dL 154(H)  BUN 8 - 23 mg/dL 46(H)  Creatinine 0.61 - 1.24 mg/dL 3.25(H)  Sodium 135 - 145 mmol/L 133(L)  Potassium 3.5 - 5.1 mmol/L 3.3(L)  Chloride 98 - 111 mmol/L 108  CO2 22 - 32 mmol/L 16(L)  Calcium 8.9 - 10.3 mg/dL 8.1(L)  Total Protein 6.5 - 8.1 g/dL 4.5(L)  Total Bilirubin 0.3 - 1.2 mg/dL 6.1(H)  Alkaline Phos 38 - 126 U/L 591(H)  AST 15 - 41 U/L 94(H)  ALT 0 - 44 U/L 130(H)   CBC Latest Ref Rng & Units 05/20/2020  WBC 4.0 - 10.5 K/uL 20.2(H)  Hemoglobin 13.0 - 17.0 g/dL 10.0(L)  Hematocrit 39 - 52 % 30.5(L)  Platelets 150 - 400 K/uL 49(L)    @IMAGES @  CT ABDOMEN PELVIS WO CONTRAST  Result Date: 05/19/2020 CLINICAL DATA:  Abdominal pain with nausea in the setting of pancreatic neuroendocrine tumor EXAM: CT ABDOMEN AND PELVIS WITHOUT CONTRAST TECHNIQUE: Multidetector CT imaging of the abdomen and pelvis was performed following the standard protocol without IV contrast. COMPARISON:  February 08, 2020 FINDINGS: Lower chest: 4 mm LEFT lower lobe pulmonary nodule along the pleural surface not present on March of 2021. No consolidation. No pleural effusion. Hepatobiliary: Post placement of biliary stent. Despite presence of stent the common bile duct is more dilated than on the  previous exam measuring up to 19 mm.  Previously approximately 13 mm. Masslike areas in the pancreatic head are similar. There is been interval development of low attenuation in the pancreatic neck that was not present previously approximately 15 mm (image 28, series 2) Also further dilation of the distal pancreatic duct is suggested with increase in the soft tissue density in the mid body of the pancreas. This area measuring approximately 3.9 cm greatest thickness at the site of ductal transition where as on the prior study it measured approximately 3.5 cm. Extending from the upper margin of the dilated duct at the site of ductal transition is a low-attenuation ovoid area measuring water density at 5 by 3.1 cm, abutting the lesser curvature. (Image 19, series 2) previously measuring approximately 3.9 x 3.0 cm in this location peripancreatic stranding is mildly increased when compared to the prior study. Soft tissue lesion associated with LEFT adrenal contiguous with with increase in size (image 21, series 2) 2.6 x 2.7 cm, previously 2.5 x 2.4 cm. Pancreas: As above Spleen: Spleen grossly normal on noncontrast imaging. Adrenals/Urinary Tract: Adrenal gland on the RIGHT is normal. LEFT adrenal mass contiguous with the pancreas enlarging as described. Mild perinephric stranding and in general anterior pararenal stranding on the LEFT. No hydronephrosis. Urinary bladder is unremarkable. Stomach/Bowel: Stomach with mild to moderate distension. Mild dilation of small bowel loops becomes moderately dilated in the mid small bowel at the level of the jejunum. There is peristaltic activity versus is transition in the distal jejunum at 3.6 cm caliber proximal to the site of narrowing with decompression of more distal small bowel loops. Colon is largely stool filled. Stool like material is present in the terminal ileum. Vascular/Lymphatic: Calcified atheromatous plaque of the abdominal aorta. No aneurysmal dilation. Adenopathy in the mesentery, largest measuring 11  mm is unchanged since the previous exam. Calcified mesenteric soft tissue (image 44, series 2) approximately 2.7 x 1.8 cm enlarged in short axis though difficult to measure given potential changes in mesenteric position over time. Reproductive: No adenopathy in the pelvis. Other: No free air.  No ascites. Musculoskeletal: No acute musculoskeletal process. Spinal degenerative changes. IMPRESSION: 1. 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 2. Worsening of biliary duct distension above the level of the stent since previous imaging from March of 2021 3. Increasing distension of small bowel in the proximal small bowel with site of transition in the distal jejunum. Is unclear whether this represents vigorous peristaltic activity following contrast administration or developing small bowel obstruction or ileus. Subsequent follow-up based on symptoms with radiographs may be helpful. 4. Increasing soft tissue and increasing ductal dilation with at least 2 sites of transition in this patient with known neuroendocrine tumor suspicious for worsening tumor with enlarging adrenal involvement on the LEFT as discussed, at the site of previous more benign appearing LEFT adrenal neoplasm now inseparable from stranding and soft tissue in the region of the pancreatic tail. 5. Given appearance it is possible that direct extension of stranding from the pancreas towards the LEFT adrenal may relate to inflammation. Attention on follow-up. 6. Extensive portosystemic collaterals in the upper abdomen compatible with splenic venous compromise, not well assessed but this secondary finding is strongly suggestive of splenic venous compromise due to worsening tumor in the region of the pancreas. Electronically Signed   By: Zetta Bills M.D.   On: 05/19/2020 12:00     Assessment and plan-  Patient is a 74 y.o. 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  Severe sepsis secondary to acute cholangitis: Patient was febrile overnight.  Bilirubin is trending up.  2 out of 2 blood cultures growing gram-negative rods.  He also has signs of severe sepsis given his tachycardia and hypotension.  All this is consistent with acute cholangitis.  Patient is currently on meropenem.  ID consult has been requested by Dr. Benny Lennert. I did get in touch with Dr. Allen Norris this morning who has seen the patient and plan is for ERCP tomorrow.  He has had a prior stent placement which will be taken out and a new stent will be placed.  Patient has been on Eliquis for his A. fib and last dose was 8 AM this morning.  Patient will likely need repeat ERCP in a few weeks if the plastic stent has to be exchanged for a metallic stent.  Given his hypotension I recommend that patient should be on maintenance IV fluids in addition to IV antibiotics  2.  Neuroendocrine tumor of the pancreas: Patient will receive his octreotide 30 mg today and will continue to receive it monthly.  Patient did not have any significant uptake on dotatate scan in the past and therefore unclear if he would have any significant response to treatment which can result in tumor shrinkage.  Once he is medically stable I will touch base with radiation oncology as well to see if there would be any role for palliative radiation As there is evidence of enlargement of the existing mass and possibly involvement of the adrenal gland on the left side as well.  Typically radiation treatment for neuroendocrine tumors is not very effective and pancreatic radiation can be associated with increased GI toxicity  3.  Abdominal pain: Likely secondary to acute cholangitis.  No overt evidence of acute pancreatitis noted on recent CT and lipase levels are normal.   Morphine can sometimes be associated with biliary colic and may be reasonable to replace it with some other as needed pain medicine  I have explained all this to the patient and he understands my plan and I will also update his niece   Visit Diagnosis 1. Epigastric pain   2. Malignant neoplasm of pancreas, unspecified location of malignancy ([place])   3. Primary pancreatic neuroendocrine tumor      Dr. Randa Evens, MD, MPH [facility] [phone] 05/20/2020 4:47 PM

## 2020-05-20 NOTE — Progress Notes (Signed)
PHARMACY - PHYSICIAN COMMUNICATION CRITICAL VALUE ALERT - BLOOD CULTURE IDENTIFICATION (BCID)  [name] is an 74 y.o. male who presented to [facility] on 05/18/2020 with a chief complaint of pain, fever  Assessment:  Lab reports 4 of 4 bottles w/ E Coli, KPC not detected & Kleb pneumo  Name of physician (or Provider) ContactedRachael Fee NP  Current antibiotics: none  Changes to prescribed antibiotics recommended: add Meropenem Recommendations accepted by provider  Results for orders placed or performed during the hospital encounter of 05/18/20  Blood Culture ID Panel (Reflexed) (Collected: 05/19/2020  3:02 PM)  Result Value Ref Range   Enterococcus species NOT DETECTED NOT DETECTED   Listeria monocytogenes NOT DETECTED NOT DETECTED   Staphylococcus species NOT DETECTED NOT DETECTED   Staphylococcus aureus (BCID) NOT DETECTED NOT DETECTED   Streptococcus species NOT DETECTED NOT DETECTED   Streptococcus agalactiae NOT DETECTED NOT DETECTED   Streptococcus pneumoniae NOT DETECTED NOT DETECTED   Streptococcus pyogenes NOT DETECTED NOT DETECTED   Acinetobacter baumannii NOT DETECTED NOT DETECTED   Enterobacteriaceae species DETECTED (A) NOT DETECTED   Enterobacter cloacae complex NOT DETECTED NOT DETECTED   Escherichia coli DETECTED (A) NOT DETECTED   Klebsiella oxytoca NOT DETECTED NOT DETECTED   Klebsiella pneumoniae DETECTED (A) NOT DETECTED   Proteus species NOT DETECTED NOT DETECTED   Serratia marcescens NOT DETECTED NOT DETECTED   Carbapenem resistance NOT DETECTED NOT DETECTED   Haemophilus influenzae NOT DETECTED NOT DETECTED   Neisseria meningitidis NOT DETECTED NOT DETECTED   Pseudomonas aeruginosa NOT DETECTED NOT DETECTED   Candida albicans NOT DETECTED NOT DETECTED   Candida glabrata NOT DETECTED NOT DETECTED   Candida krusei NOT DETECTED NOT DETECTED   Candida parapsilosis NOT DETECTED NOT DETECTED   Candida tropicalis NOT DETECTED NOT DETECTED     Hart Robinsons A 05/20/2020  3:39 AM

## 2020-05-20 NOTE — Progress Notes (Signed)
Pharmacy Antibiotic Note  [name] is a 74 y.o. male admitted on 05/18/2020 with bacteremia.  Pharmacy has been consulted for Meropenem dosing.  Plan: Meropenem 1gm IV q12hrs (renally adjusted)  Height: 5\' 7"  (170.2 cm) Weight: 63.9 kg (140 lb 14 oz) IBW/kg (Calculated) : 66.1  Temp (24hrs), Avg:100.3 F (37.9 C), Min:99 F (37.2 C), Max:103 F (39.4 C)  Recent Labs  Lab 05/18/20 1747 05/19/20 0533 05/19/20 0833 05/19/20 1134 05/19/20 1913 05/19/20 2139  WBC 8.8 13.6*  --   --   --   --   CREATININE 2.77* 2.73*  --   --   --   --   LATICACIDVEN  --   --  2.2* 2.9* 2.1* 2.0*    Estimated Creatinine Clearance: 21.1 mL/min (A) (by C-G formula based on SCr of 2.73 mg/dL (H)).    Allergies  Allergen Reactions  . Levaquin [Levofloxacin In D5w] Swelling and Rash    Antimicrobials this admission:   >>    >>   Dose adjustments this admission:   Microbiology results:  BCx:   UCx:    Sputum:    MRSA PCR:   Thank you for allowing pharmacy to be a part of this patient's care.  Hart Robinsons A 05/20/2020 3:45 AM

## 2020-05-20 NOTE — Consult Note (Signed)
NAME: [name]  DOB: 1946/05/01  MRN: [id number]  Date/Time: 05/20/2020 3:26 PM  REQUESTING PROVIDER: Dr. Benny Lennert Subjective:  REASON FOR CONSULT: Bacteremia ? [name] is a 74 y.o. male with a history of pancreatic neuroendocrine tumor causing biliary obstruction, diabetes mellitus, hypertension, CKD, atrial flutter status post ablation Presented to the ED on 05/18/2020 with abdominal pain.  He also was having vomiting.  Vitals were temperature of 97.6 which later in 24 hours became 103.  Blood pressure was 142/118, heart rate of 85, and sats of 98%.  Labs revealed an alkaline phosphatase of 844, AST of 87, ALT of 149, total bili of 3.5, creatinine of 2.77, WBC of 8.8, hemoglobin of 11.9 platelet of 168.  Blood cultures were sent and he was started on antibiotics CT abdomen and pelvis revealed a biliary stent but the common bile duct was more dilated than previous examination.  Masslike area in the pancreatic head.  Calcified mesenteric soft tissue mass of 2.7 and 1.87 cm.  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  And increasing distention of small bowel in the proximal small bowel with site of transition of the distal jejunum.  And also an enlarging adrenal involvement of the left concerning for neuroendocrine tumor. I am asked to see the patient because of polymicrobial bacteremia.  Blood cultures having both Klebsiella and E. coli.  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  Course complicated by renal failure and initial dialysis need, GI bleed and pneumonia leading to septic shock.  Subsequently improved and off dialysis.  ERCP performed on 3/30 showed severe localized biliary stricture and underwent sphincterotomy with balloon sphincteroplasty.  He also had a biliary stent placement.  He also had a pancreatic duct stent placement he completed course of Zosyn post procedure.  The brushings were positive for  tumor .   Past Medical History:  Diagnosis Date  . Aortic atherosclerosis ([place])    As seen on 04/2019 CT  . Atrial flutter ([place])    a. s/p ablation 2017/2018 and DCCV 2015/2016/2019 b. 2017 echo EF 60-65%, no RWMA, mild MR,  . CAD (coronary artery disease)    3v CAD 04/2019 CT  . Carcinoid tumor    metastatic  . CKD (chronic kidney disease), stage III   . COPD (chronic obstructive pulmonary disease) ([facility])    PM oxygen  . Current occasional smoker    1-2 cigarettes / week  . Diabetes mellitus type 2, insulin dependent ([place])   . Heart failure with preserved ejection fraction ([place])    2017 echo EF 60-65%, no RWMA, mild MR,    . Hyperlipidemia   . Hypertension   . Pancreatitis   . Polycythemia     Past Surgical History:  Procedure Laterality Date  . A-FLUTTER ABLATION N/A 10/14/2017   Procedure: A-FLUTTER ABLATION;  Surgeon: Constance Haw, MD;  Location: [place] [facility];  Service: Cardiovascular;  Laterality: N/A;  . CARDIOVERSION N/A 09/08/2017   Procedure: CARDIOVERSION;  Surgeon: [name], MD;  Location: [facility];  Service: Cardiovascular;  Laterality: N/A;  . CARDIOVERSION N/A 03/22/2018   Procedure: CARDIOVERSION;  Surgeon: [name], MD;  Location: [facility];  Service: Cardiovascular;  Laterality: N/A;  . COLONOSCOPY    . COLONOSCOPY WITH PROPOFOL N/A 10/31/2019   Procedure: COLONOSCOPY WITH PROPOFOL;  Surgeon: Lin Landsman, MD;  Location: [facility];  Service: Gastroenterology;  Laterality: N/A;  . ELECTROPHYSIOLOGIC STUDY N/A  10/27/2016   Procedure: A-Flutter Ablation;  Surgeon: Deboraha Sprang, MD;  Location: [place] [facility];  Service: Cardiovascular;  Laterality: N/A;  . EUS N/A 12/22/2017   Procedure: FULL UPPER ENDOSCOPIC ULTRASOUND (EUS) RADIAL;  Surgeon: Reita Cliche, MD;  Location: [facility] ENDOSCOPY;  Service: Gastroenterology;  Laterality: N/A;  . TEE WITH CARDIOVERSION  12/16  . TEMPORARY DIALYSIS CATHETER N/A 02/08/2020    Procedure: TEMPORARY DIALYSIS CATHETER;  Surgeon: Algernon Huxley, MD;  Location: [place] [facility];  Service: Cardiovascular;  Laterality: N/A;    Social History   Socioeconomic History  . Marital status: Widowed    Spouse name: Not on file  . Number of children: Not on file  . Years of education: Not on file  . Highest education level: Not on file  Occupational History  . Not on file  Tobacco Use  . Smoking status: Light Tobacco Smoker    Years: 45.00    Types: Cigarettes    Last attempt to quit: 11/27/2012    Years since quitting: 7.4  . Smokeless tobacco: Never Used  Vaping Use  . Vaping Use: Never used  Substance and Sexual Activity  . Alcohol use: Yes    Alcohol/week: 2.0 standard drinks    Types: 2 Cans of beer per week    Comment: beer every once a while once a monthnone last 24hrs  . Drug use: No  . Sexual activity: Not on file  Other Topics Concern  . Not on file  Social History Narrative  . Not on file   Social Determinants of Health   Financial Resource Strain:   . Difficulty of Paying Living Expenses:   Food Insecurity:   . Worried About Charity fundraiser in the Last Year:   . Arboriculturist in the Last Year:   Transportation Needs:   . Film/video editor (Medical):   Marland Kitchen Lack of Transportation (Non-Medical):   Physical Activity:   . Days of Exercise per Week:   . Minutes of Exercise per Session:   Stress:   . Feeling of Stress :   Social Connections:   . Frequency of Communication with Friends and Family:   . Frequency of Social Gatherings with Friends and Family:   . Attends Religious Services:   . Active Member of Clubs or Organizations:   . Attends Archivist Meetings:   Marland Kitchen Marital Status:   Intimate Partner Violence:   . Fear of Current or Ex-Partner:   . Emotionally Abused:   Mar[place] Kitchen Physically Abused:   . Sexually Abused:     Family History  Problem Relation Age of Onset  . Hypertension Father    Allergies  Allergen  Reactions  . Levaquin [Levofloxacin In D5w] Swelling and Rash    ? Current Facility-Administered Medications  Medication Dose Route Frequency Provider Last Rate Last Admin  . 0.9 %  sodium chloride infusion   Intravenous PRN Swayze, Ava, DO 10 mL/hr at 05/20/20 0733 250 mL at 05/20/20 0733  . acetaminophen (TYLENOL) tablet 1,000 mg  1,000 mg Oral Q6H PRN Sharion Settler, NP   1,000 mg at 05/20/20 0000  . amiodarone (PACERONE) tablet 200 mg  200 mg Oral Daily Tu, Ching T, DO   200 mg at 05/20/20 0825  . feeding supplement (BOOST / RESOURCE BREEZE) liquid 1 Container  1 Container Oral TID BM Swayze, Ava, DO   1 Container at 05/20/20 1214  . insulin aspart (novoLOG) injection 0-9 Units  0-9 Units Subcutaneous TID WC Tu, Ching T, DO   1 Units at 05/20/20 1214  . insulin glargine (LANTUS) injection 12 Units  12 Units Subcutaneous QHS Tu, Ching T, DO   12 Units at 05/19/20 2123  . levothyroxine (SYNTHROID) tablet 25 mcg  25 mcg Oral q morning - 10a Tu, Ching T, DO   25 mcg at 05/20/20 0705  . meropenem (MERREM) 1 g in sodium chloride 0.9 % 100 mL IVPB  1 g Intravenous Q12H Hall, Scott A, RPH 200 mL/hr at 05/20/20 0734 1 g at 05/20/20 0734  . midodrine (PROAMATINE) tablet 10 mg  10 mg Oral TID Tu, Ching T, DO   10 mg at 05/20/20 0757  . mometasone-formoterol (DULERA) 100-5 MCG/ACT inhaler 2 puff  2 puff Inhalation BID Tu, Ching T, DO   2 puff at 05/20/20 0828  . morphine 2 MG/ML injection 1 mg  1 mg Intravenous Q2H PRN Tu, Ching T, DO   1 mg at 05/19/20 0232  . octreotide (SANDOSTATIN LAR) IM injection 30 mg  30 mg Intramuscular Once Sindy Guadeloupe, MD      . ondansetron [facility]) injection 4 mg  4 mg Intravenous Q6H PRN Tu, Ching T, DO   4 mg at 05/20/20 1340  . pantoprazole (PROTONIX) EC tablet 40 mg  40 mg Oral Daily Tu, Ching T, DO   40 mg at 05/20/20 0825  . rosuvastatin (CRESTOR) tablet 10 mg  10 mg Oral Daily Swayze, Ava, DO      . vitamin B-12 (CYANOCOBALAMIN) tablet 500 mcg  500 mcg Oral  Daily Tu, Ching T, DO   500 mcg at 05/20/20 0825     Abtx:  Anti-infectives (From admission, onward)   Start     Dose/Rate Route Frequency Ordered Stop   05/20/20 0600  meropenem (MERREM) 1 g in sodium chloride 0.9 % 100 mL IVPB     Discontinue     1 g 200 mL/hr over 30 Minutes Intravenous Every 12 hours 05/20/20 0344        REVIEW OF SYSTEMS:  Const:  fever, negative chills, weight loss Eyes: negative diplopia or visual changes, negative eye pain ENT: negative coryza, negative sore throat Resp: negative cough, hemoptysis, dyspnea Cards: negative for chest pain, palpitations, lower extremity edema GU: negative for frequency, dysuria and hematuria GI:  abdominal pain, vomiting diarrhea, no bleeding, constipation Skin: negative for rash and pruritus Heme: negative for easy bruising and gum/nose bleeding MS: generalized weakness Neurolo:negative for headaches, dizziness, vertigo, memory problems  Psych: negative for feelings of anxiety, depression  Endocrine: negative for thyroid, diabetes Allergy/Immunology- as above Objective:  VITALS:  BP (!) 87/54 (BP Location: Right Arm)   Pulse 62   Temp 97.9 F (36.6 C) (Oral)   Resp 16   Ht 5\' 7"  (1.702 m)   Wt 63.9 kg   SpO2 100%   BMI 22.06 kg/m  PHYSICAL EXAM:  General: Alert, cooperative, no distress, appears stated age.  Head: Normocephalic, without obvious abnormality, atraumatic. Eyes: Conjunctivae clear, icteric sclerae. Pupils are equal ENT Nares normal. No drainage or sinus tenderness. Lips, mucosa, and tongue normal. No Thrush Neck: Supple, symmetrical, no adenopathy, thyroid: non tender no carotid bruit and no JVD. Back: No CVA tenderness. Lungs: Clear to auscultation bilaterally. No Wheezing or Rhonchi. No rales. Heart: Regular rate and rhythm, no murmur, rub or gallop. Abdomen: Soft, distended.  Extremities: atraumatic, no cyanosis. No edema. No clubbing Skin: No rashes or lesions. Or bruising Lymph:  Cervical,  supraclavicular normal. Neurologic: Grossly non-focal Pertinent Labs Lab Results CBC    Component Value Date/Time   WBC 20.2 (H) 05/20/2020 0439   RBC 3.35 (L) 05/20/2020 0439   HGB 10.0 (L) 05/20/2020 0439   HGB 15.6 10/11/2017 0932   HCT 30.5 (L) 05/20/2020 0439   HCT 47.0 10/11/2017 0932   PLT 49 (L) 05/20/2020 0439   PLT 131 (L) 10/11/2017 0932   MCV 91.0 05/20/2020 0439   MCV 99 (H) 10/11/2017 0932   MCV 100 11/04/2014 0451   MCH 29.9 05/20/2020 0439   MCHC 32.8 05/20/2020 0439   RDW 14.9 05/20/2020 0439   RDW 12.8 10/11/2017 0932   RDW 12.7 11/04/2014 0451   LYMPHSABS 0.5 (L) 02/08/2020 1015   LYMPHSABS 0.6 (L) 09/06/2017 0819   LYMPHSABS 1.6 10/29/2014 0905   MONOABS 1.1 (H) 02/08/2020 1015   MONOABS 1.2 (H) 10/29/2014 0905   EOSABS 0.1 02/08/2020 1015   EOSABS 0.2 09/06/2017 0819   EOSABS 0.2 10/29/2014 0905   BASOSABS 0.0 02/08/2020 1015   BASOSABS 0.0 09/06/2017 0819   BASOSABS 0.0 10/29/2014 0905    CMP Latest Ref Rng & Units 05/20/2020 05/19/2020 05/18/2020  Glucose 70 - 99 mg/dL 154(H) 154(H) 221(H)  BUN 8 - 23 mg/dL 46(H) 38(H) 36(H)  Creatinine 0.61 - 1.24 mg/dL 3.25(H) 2.73(H) 2.77(H)  Sodium 135 - 145 mmol/L 133(L) 139 135  Potassium 3.5 - 5.1 mmol/L 3.3(L) 4.2 3.9  Chloride 98 - 111 mmol/L 108 111 107  CO2 22 - 32 mmol/L 16(L) 20(L) 16(L)  Calcium 8.9 - 10.3 mg/dL 8.1(L) 8.6(L) 9.1  Total Protein 6.5 - 8.1 g/dL 4.5(L) 6.0(L) 6.5  Total Bilirubin 0.3 - 1.2 mg/dL 6.1(H) 5.9(H) 3.5(H)  Alkaline Phos 38 - 126 U/L 591(H) 757(H) 844(H)  AST 15 - 41 U/L 94(H) 99(H) 87(H)  ALT 0 - 44 U/L 130(H) 149(H) 149(H)      Microbiology: Recent Results (from the past 240 hour(s))  SARS Coronavirus 2 by RT PCR (hospital order, performed in [facility] hospital lab) Nasopharyngeal Urine, Clean Catch     Status: None   Collection Time: 05/18/20 11:30 PM   Specimen: Urine, Clean Catch; Nasopharyngeal  Result Value Ref Range Status   SARS Coronavirus 2 NEGATIVE  NEGATIVE Final    Comment: (NOTE) SARS-CoV-2 target nucleic acids are NOT DETECTED.  The SARS-CoV-2 RNA is generally detectable in upper and lower respiratory specimens during the acute phase of infection. The lowest concentration of SARS-CoV-2 viral copies this assay can detect is 250 copies / mL. A negative result does not preclude SARS-CoV-2 infection and should not be used as the sole basis for treatment or other patient management decisions.  A negative result may occur with improper specimen collection / handling, submission of specimen other than nasopharyngeal swab, presence of viral mutation(s) within the areas targeted by this assay, and inadequate number of viral copies (<250 copies / mL). A negative result must be combined with clinical observations, patient history, and epidemiological information.  Fact Sheet for Patients:   StrictlyIdeas.no  Fact Sheet for Healthcare Providers: BankingDealers.co.za  This test is not yet approved or  cleared by the [place] FDA and has been authorized for detection and/or diagnosis of SARS-CoV-2 by FDA under an Emergency Use Authorization (EUA).  This EUA will remain in effect (meaning this test can be used) for the duration of the COVID-19 declaration under Section 564(b)(1) of the Act, 21 U.S.C. section 360bbb-3(b)(1), unless the authorization is terminated or revoked  sooner.  Performed at [facility], [address] [place], [place] [address]   CULTURE, BLOOD (ROUTINE X 2) w Reflex to ID Panel     Status: None (Preliminary result)   Collection Time: 05/19/20  3:02 PM   Specimen: BLOOD  Result Value Ref Range Status   Specimen Description   Final    BLOOD RIGHT ANTECUBITAL Performed at [facility], [address], [place], [place] [address]    Special Requests   Final    BOTTLES DRAWN AEROBIC AND ANAEROBIC Blood Culture adequate volume Performed at  [facility], [address], [place], [place] [address]    Culture  Setup Time   Final    GRAM NEGATIVE RODS IN BOTH AEROBIC AND ANAEROBIC BOTTLES CRITICAL RESULT CALLED TO, READ BACK BY AND VERIFIED WITH: Hart Robinsons [facility] [address] 05/20/20 HNM Performed at [facility], [place] [address] [place], [place] [address]    Culture GRAM NEGATIVE RODS  Final   Report Status PENDING  Incomplete  Blood Culture ID Panel (Reflexed)     Status: Abnormal   Collection Time: 05/19/20  3:02 PM  Result Value Ref Range Status   Enterococcus species NOT DETECTED NOT DETECTED Final   Listeria monocytogenes NOT DETECTED NOT DETECTED Final   Staphylococcus species NOT DETECTED NOT DETECTED Final   Staphylococcus aureus (BCID) NOT DETECTED NOT DETECTED Final   Streptococcus species NOT DETECTED NOT DETECTED Final   Streptococcus agalactiae NOT DETECTED NOT DETECTED Final   Streptococcus pneumoniae NOT DETECTED NOT DETECTED Final   Streptococcus pyogenes NOT DETECTED NOT DETECTED Final   Acinetobacter baumannii NOT DETECTED NOT DETECTED Final   Enterobacteriaceae species DETECTED (A) NOT DETECTED Final    Comment: CRITICAL RESULT CALLED TO, READ BACK BY AND VERIFIED WITH:  SCOTT HALL PHARMD [id number] 05/20/20 HNM    Enterobacter cloacae complex NOT DETECTED NOT DETECTED Final   Escherichia coli DETECTED (A) NOT DETECTED Final    Comment: CRITICAL RESULT CALLED TO, READ BACK BY AND VERIFIED WITH: SCOTT HALL PHARMD 0335 05/20/20 HNM    Klebsiella oxytoca NOT DETECTED NOT DETECTED Final   Klebsiella pneumoniae DETECTED (A) NOT DETECTED Final    Comment: CRITICAL RESULT CALLED TO, READ BACK BY AND VERIFIED WITH: SCOTT HALL PHARMD 0335 05/20/20 HNM    Proteus species NOT DETECTED NOT DETECTED Final   Serratia marcescens NOT DETECTED NOT DETECTED Final   Carbapenem resistance NOT DETECTED NOT DETECTED Final   Haemophilus influenzae NOT DETECTED NOT DETECTED Final   Neisseria meningitidis NOT DETECTED NOT  DETECTED Final   Pseudomonas aeruginosa NOT DETECTED NOT DETECTED Final   Candida albicans NOT DETECTED NOT DETECTED Final   Candida glabrata NOT DETECTED NOT DETECTED Final   Candida krusei NOT DETECTED NOT DETECTED Final   Candida parapsilosis NOT DETECTED NOT DETECTED Final   Candida tropicalis NOT DETECTED NOT DETECTED Final    Comment: Performed at [facility], [address] [place], [place] [address]  CULTURE, BLOOD (ROUTINE X 2) w Reflex to ID Panel     Status: None (Preliminary result)   Collection Time: 05/19/20  3:14 PM   Specimen: BLOOD  Result Value Ref Range Status   Specimen Description BLOOD BLOOD RIGHT HAND  Final   Special Requests   Final    BOTTLES DRAWN AEROBIC AND ANAEROBIC Blood Culture adequate volume   Culture  Setup Time   Final    GRAM NEGATIVE RODS IN BOTH AEROBIC AND ANAEROBIC BOTTLES CRITICAL VALUE  NOTED.  VALUE IS CONSISTENT WITH PREVIOUSLY REPORTED AND CALLED VALUE. Performed at [facility], [address], [place], [place] [address]    Culture GRAM NEGATIVE RODS  Final   Report Status PENDING  Incomplete    IMAGING RESULTS:reviewed CT abdomen Signs of pancreatitis and presumed pancreatic pseudocysts with enlargement of area in the lesser sac abutting the stomach as described. 2. Worsening of biliary duct distension above the level of the stent since previous imaging from March of 2021 3. Increasing distension of small bowel in the proximal small bowel with site of transition in the distal jejunum. Is unclear whether this represents vigorous peristaltic activity following contrast administration or developing small bowel obstruction or ileus. Subsequent follow-up based on symptoms with radiographs may  I have personally reviewed the films ? 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 Possible the stent is occluded. Seen by  GI. Will be getting ERCP  E.coli and klebsiella bacteremia with sepsis  due to the above. On meropenem until sensi available  Pancreatitis and pseudocyst complication of the pancreative Neuroendocrine tumor with stent in pancreatic duct   ?Thrombocytopenia - worsened by infection  Leucocytosis worsening with infection  CKD-  ? ? ___________________________________________________ Discussed with patient, requesting provider Note:  This document was prepared using Dragon voice recognition software and may include unintentional dictation errors.

## 2020-05-20 NOTE — Significant Event (Signed)
Rapid Response Event Note  Overview: Time Called: 0543 Arrival Time: 0546 Event Type: Hypotension  Initial Focused Assessment: RR called for pt with HR in 150s and hypotension briefly. Upon arrival to room, pt's HR back to 90s. Previous orders to treat hypotension from Sharion Settler, NP just now started. Patient is alert and appropriate. No complaints of acute distress.   Interventions: Give albumin and fluid bolus as ordered per NP. Monitor closely.  Plan of Care (if not transferred):  Event Summary: Name of Physician Notified: Sharion Settler, NP at      at    Outcome: Stayed in room and stabalized  Event End Time: 4628  [name]

## 2020-05-20 NOTE — TOC Initial Note (Signed)
Transition of Care [facility]) - Initial/Assessment Note    Patient Details  Name: [name] MRN: [id number] Date of Birth: 06/24/1946  Transition of Care [facility]) CM/SW Contact:    Shelbie Hutching, RN Phone Number: 05/20/2020, 12:23 PM  Clinical Narrative:                 Patient admitted to the hospital for abdominal pain, he has a history of pancreatic cancer.  Patient is from home where he lives alone.  Patient reports that recently, since he has been feeling bad, he has been staying with his friend Lorie Apley in [place].  Patient is open with [facility] for nursing.  At discharge PT will be added to home health orders.  Patient is current with his PCP.  Patient has a walker cane and bedside commode at home.  At discharge patient will go back to Peggy's home.   Expected Discharge Plan: [place] Barriers to Discharge: Continued Medical Work up   Patient Goals and CMS Choice Patient states their goals for this hospitalization and ongoing recovery are:: Wants to feel good and be able to go home and take care of himself CMS Medicare.gov Compare Post Acute Care list provided to:: Patient Choice offered to / list presented to : Patient  Expected Discharge Plan and Services Expected Discharge Plan: [place]   Discharge Planning Services: CM Consult Post Acute Care Choice: Home Health, Resumption of Svcs/PTA Provider Living arrangements for the past 2 months: Mobile Home                           HH Arranged: RN, PT [facility] Agency: [place] Date [place]: 05/20/20 Time HH Agency Contacted: 1222 Representative spoke with at [place]: Sharmon Revere  Prior Living Arrangements/Services Living arrangements for the past 2 months: Mobile Home Lives with:: Self Patient language and need for interpreter reviewed:: Yes Do you feel safe going back to the place where you live?: Yes      Need for Family Participation in  Patient Care: Yes (Comment) (pancreatic cancer) Care giver support system in place?: Yes (comment) (niece and friend Clinical cytogeneticist) Current home services: DME (walker, cane, bedside commode) Criminal Activity/Legal Involvement Pertinent to Current Situation/Hospitalization: No - Comment as needed  Activities of Daily Living Home Assistive Devices/Equipment: Cane (specify quad or straight) ADL Screening (condition at time of admission) Patient's cognitive ability adequate to safely complete daily activities?: Yes Is the patient deaf or have difficulty hearing?: No Does the patient have difficulty seeing, even when wearing glasses/contacts?: No Does the patient have difficulty concentrating, remembering, or making decisions?: No Patient able to express need for assistance with ADLs?: Yes Does the patient have difficulty dressing or bathing?: No Independently performs ADLs?: Yes (appropriate for developmental age) Does the patient have difficulty walking or climbing stairs?: Yes Weakness of Legs: Both Weakness of Arms/Hands: None  Permission Sought/Granted Permission sought to share information with : Case Manager, Other (comment), Family Supports Permission granted to share information with : Yes, Verbal Permission Granted  Share Information with NAME: Alfreda Poteat  Permission granted to share info w AGENCY: [facility]  Permission granted to share info w Relationship: niece     Emotional Assessment Appearance:: Appears stated age Attitude/Demeanor/Rapport: Engaged Affect (typically observed): Accepting Orientation: : Oriented to Self, Oriented to Place, Oriented to  Time, Oriented to Situation Alcohol / Substance Use: Not Applicable Psych Involvement:  No (comment)  Admission diagnosis:  Epigastric pain [R10.13] Abdominal pain [R10.9] Malignant neoplasm of pancreas, unspecified location of malignancy [facility]) [C25.9] Patient Active Problem List   Diagnosis Date Noted  . Abdominal pain  05/19/2020  . Acute kidney injury superimposed on chronic kidney disease ([place]) 05/19/2020  . Transaminitis 05/19/2020  . Protein-calorie malnutrition, severe 05/19/2020  . Neoplasm related pain   . Primary pancreatic neuroendocrine tumor   . 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 Aortic atherosclerosis ([place]) 03/21/2020  . Chronic respiratory failure with hypoxia ([place]) 03/21/2020  . Thrombocytopenia, unspecified ([place]) 03/21/2020  . Coronary artery disease of native artery of native heart with stable angina pectoris ([place]) 03/21/2020  . Secondary hyperparathyroidism of renal origin ([place]) 08/20/2019  . Stage 3b chronic kidney disease 08/20/2019  . Elevated serum creatinine 07/03/2019  . Acute pancreatitis 11/25/2018  . S/P ablation of atrial flutter 10/15/2017  . Atrial flutter ([place]) 10/14/2017  . Leukocytosis 11/19/2016  . Erythrocytosis 11/19/2016  . Diabetes type 2, uncontrolled ([place]) 09/01/2015  . 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[place]) 09/01/2015  . CAD (coronary artery disease) 09/01/2015  . Left main coronary artery disease 09/01/2015  . Chronic obstructive pulmonary disease ([place]) 01/02/2015  . Encounter for screening colonoscopy 01/02/2015  . Typical atrial flutter ([place]) 11/27/2014  . Smoking history 11/27/2014  . Emphysema of lung ([place]) 11/27/2014  . Pneumonia, organism unspecified(486) 11/27/2014  . Essential hypertension 11/27/2014  . Adrenal nodule ([place]) 06/11/2014  . B12 deficiency 06/11/2014  . Hypertriglyceridemia 06/11/2014  . Microalbuminuria 06/11/2014  . Type 2 diabetes mellitus ([place]) 06/06/2014   PCP:  [name], MD Pharmacy:   [facility] [address] ([address]), [place] [address] [place]) [place] [address] Phone: [phone] Fax: [phone]     Social Determinants of Health (SDOH) Interventions    Readmission Risk Interventions No flowsheet data found.

## 2020-05-20 NOTE — Progress Notes (Signed)
Patient was in distress, received medical attention. Chaplain offered prayer for comfort and healing.

## 2020-05-20 NOTE — Progress Notes (Signed)
Rounded on patient as follow up from last night rapid response event and per request of patient's RN. Patient with hypotension with SBPs in 70s (last MAP 60). MD aware. At time of check patient receiving slow bolus and had already received his morning midodrine and thyroid medication. Patient alert and oriented, pleasant. No complaints of dizziness, shortness of breath, or lightheadedness.

## 2020-05-20 NOTE — Consult Note (Signed)
Carlos Lame, MD [facility]  [address] [place], [place] [address] Phone: [phone] Fax : [phone]  Consultation  Referring Provider:     Dr. Benny Lennert Primary Care Physician:  [name], MD Primary Gastroenterologist:  Dr. Marius Ditch         Reason for Consultation:     Cholangitis  Date of Admission:  05/18/2020 Date of Consultation:  05/20/2020         HPI:   [name] is a 74 y.o. male who has a history of a pancreatic neoplasm that is thought to be a neuroendocrine tumor.  The patient has had recurrent pancreatitis and a history of a flutter and is presently on Eliquis.  The patient presented with abdominal pain with nausea and came to the hospital.  The patient had a CT scan at the beginning of June that showed him to have an increased size of his pancreatic tumor with a stent placed.  The patient was to have a repeat follow-up with [facility] for his stent on the 15th of this month. The patient had blood cultures checked on admission that showed E. coli and Klebsiella with an Enterobacter species detected.  The patient's liver enzymes since admission showed:  Component     Latest Ref Rng & Units 05/18/2020 05/19/2020 05/20/2020  AST     15 - 41 U/L 87 (H) 99 (H) 94 (H)  ALT     0 - 44 U/L 149 (H) 149 (H) 130 (H)  Alkaline Phosphatase     38 - 126 U/L 844 (H) 757 (H) 591 (H)  Total Bilirubin     0.3 - 1.2 mg/dL 3.5 (H) 5.9 (H) 6.1 (H)   The patient CT scan of the abdomen showed pancreatitis with a presumed pseudocyst with worsening of the biliary ductal dilatation when compared to imaging in March of this year.  There is also increased distention of the small bowel and the proximal small bowel with a site of transition in the distal jejunum.  There is also ductal dilatation of the pancreatic duct with what appears to be 2 transition points.  Past Medical History:  Diagnosis Date  . Aortic atherosclerosis ([place])    As seen on 04/2019 CT  . Atrial flutter ([place])    a.  s/p ablation 2017/2018 and DCCV 2015/2016/2019 b. 2017 echo EF 60-65%, no RWMA, mild MR,  . CAD (coronary artery disease)    3v CAD 04/2019 CT  . Carcinoid tumor    metastatic  . CKD (chronic kidney disease), stage III   . COPD (chronic obstructive pulmonary disease) (HCC)    PM oxygen  . Current occasional smoker    1-2 cigarettes / week  . Diabetes mellitus type 2, insulin dependent ([place])   . Heart failure with preserved ejection fraction ([place])    2017 echo EF 60-65%, no RWMA, mild MR,    . Hyperlipidemia   . Hypertension   . Pancreatitis   . Polycythemia     Past Surgical History:  Procedure Laterality Date  . A-FLUTTER ABLATION N/A 10/14/2017   Procedure: A-FLUTTER ABLATION;  Surgeon: Constance Haw, MD;  Location: [place] CV LAB;  Service: Cardiovascular;  Laterality: N/A;  . CARDIOVERSION N/A 09/08/2017   Procedure: CARDIOVERSION;  Surgeon: [name], MD;  Location: [facility] ORS;  Service: Cardiovascular;  Laterality: N/A;  . CARDIOVERSION N/A 03/22/2018   Procedure: CARDIOVERSION;  Surgeon: [name], MD;  Location: [facility] ORS;  Service: Cardiovascular;  Laterality: N/A;  . COLONOSCOPY    . COLONOSCOPY WITH PROPOFOL N/A 10/31/2019   Procedure: COLONOSCOPY WITH PROPOFOL;  Surgeon: Lin Landsman, MD;  Location: [facility] ENDOSCOPY;  Service: Gastroenterology;  Laterality: N/A;  . ELECTROPHYSIOLOGIC STUDY N/A 10/27/2016   Procedure: A-Flutter Ablation;  Surgeon: Deboraha Sprang, MD;  Location: [place] CV LAB;  Service: Cardiovascular;  Laterality: N/A;  . EUS N/A 12/22/2017   Procedure: FULL UPPER ENDOSCOPIC ULTRASOUND (EUS) RADIAL;  Surgeon: Reita Cliche, MD;  Location: [facility] ENDOSCOPY;  Service: Gastroenterology;  Laterality: N/A;  . TEE WITH CARDIOVERSION  12/16  . TEMPORARY DIALYSIS CATHETER N/A 02/08/2020   Procedure: TEMPORARY DIALYSIS CATHETER;  Surgeon: Algernon Huxley, MD;  Location: [place] CV LAB;  Service: Cardiovascular;  Laterality: N/A;     Prior to Admission medications   Medication Sig Start Date End Date Taking? Authorizing Provider  acetaminophen (TYLENOL) 500 MG tablet Take 500-1,000 mg by mouth every 6 (six) hours as needed for moderate pain or headache.     [provider]  albuterol (PROAIR HFA) 108 (90 Base) MCG/ACT inhaler Inhale 2 puffs into the lungs every 4 (four) hours as needed for wheezing or shortness of breath.     [provider]  amiodarone (PACERONE) 200 MG tablet Take 1 tablet (200 mg total) by mouth daily. 04/17/20   [name], MD  apixaban (ELIQUIS) 2.5 MG TABS tablet Take 1 tablet (2.5 mg total) by mouth 3 (three) times daily. 04/25/20   [name], MD  budesonide-formoterol (SYMBICORT) 80-4.5 MCG/ACT inhaler Inhale 2 puffs into the lungs 2 (two) times daily. Takes at noon and at 4pm.    [provider]  EUTHYROX 25 MCG tablet Take 1 tablet (25 mcg total) by mouth every morning. 04/17/20   [name], MD  Insulin Glargine (LANTUS SOLOSTAR) 100 UNIT/ML Solostar Pen Inject 5 Units into the skin at bedtime.  11/14/19 11/13/20  [provider]  levothyroxine (SYNTHROID, LEVOTHROID) 50 MCG tablet Take 25 mcg by mouth daily.  06/13/18   [provider]  metoprolol succinate (TOPROL-XL) 50 MG 24 hr tablet TAKE 1 TABLET BY MOUTH IN THE EVENING. TAKE WITH OR IMMEDIATELY FOLLOWING A MEAL 04/08/20   Marrianne Mood D, PA-C  midodrine (PROAMATINE) 10 MG tablet Take 1 tablet (10 mg total) by mouth 3 (three) times daily. 04/21/20   [name], MD  omeprazole (PRILOSEC) 20 MG capsule Take 1 capsule by mouth once daily 04/15/20   [name], MD  rosuvastatin (CRESTOR) 40 MG tablet Take 1 tablet (40 mg total) by mouth daily. 04/17/20   [name], MD  vitamin B-12 (CYANOCOBALAMIN) 500 MCG tablet Take 500 mcg by mouth daily.     [provider]    Family History  Problem Relation Age of Onset  . Hypertension Father      Social History   Tobacco Use  .  Smoking status: Light Tobacco Smoker    Years: 45.00    Types: Cigarettes    Last attempt to quit: 11/27/2012    Years since quitting: 7.4  . Smokeless tobacco: Never Used  Vaping Use  . Vaping Use: Never used  Substance Use Topics  . Alcohol use: Yes    Alcohol/week: 2.0 standard drinks    Types: 2 Cans of beer per week    Comment: beer every once a while once a monthnone last 24hrs  . Drug use: No    Allergies as of 05/18/2020 - Review Complete 05/18/2020  Allergen Reaction Noted  . Levaquin [levofloxacin in d5w] Swelling and Rash 11/27/2014    Review of Systems:    All systems reviewed and negative except where noted in HPI.   Physical Exam:  Vital signs in last 24 hours: Temp:  [97.9 F (36.6 C)-103 F (39.4 C)] 97.9 F (36.6 C) (07/06 1212) Pulse Rate:  [62-158] 74 (07/06 1553) Resp:  [16-20] 16 (07/06 1212) BP: (72-108)/(39-66) 108/66 (07/06 1553) SpO2:  [96 %-100 %] 100 % (07/06 1212) Last BM Date: 05/18/20 General:   Pleasant, cooperative in NAD Head:  Normocephalic and atraumatic. Eyes:   Positive icterus.   Conjunctiva yellow. PERRLA. Ears:  Normal auditory acuity. Neck:  Supple; no masses or thyroidomegaly Lungs: Respirations even and unlabored. Lungs clear to auscultation bilaterally.   No wheezes, crackles, or rhonchi.  Heart:  Regular rate and rhythm;  Without murmur, clicks, rubs or gallops Abdomen:  Soft, nondistended, nontender. Normal bowel sounds. No appreciable masses or hepatomegaly.  No rebound or guarding.  Rectal:  Not performed. Msk:  Symmetrical without gross deformities.    Extremities:  Without edema, cyanosis or clubbing. Neurologic:  Alert and oriented x3;  grossly normal neurologically. Skin:  Intact without significant lesions or rashes. Cervical Nodes:  No significant cervical adenopathy. Psych:  Alert and cooperative. Normal affect.  LAB RESULTS: Recent Labs    05/18/20 1747 05/19/20 0533 05/20/20 0439  WBC 8.8 13.6* 20.2*   HGB 11.9* 10.8* 10.0*  HCT 35.1* 32.9* 30.5*  PLT 168 106* 49*   BMET Recent Labs    05/18/20 1747 05/19/20 0533 05/20/20 0439  NA 135 139 133*  K 3.9 4.2 3.3*  CL 107 111 108  CO2 16* 20* 16*  GLUCOSE 221* 154* 154*  BUN 36* 38* 46*  CREATININE 2.77* 2.73* 3.25*  CALCIUM 9.1 8.6* 8.1*   LFT Recent Labs    05/20/20 0439  PROT 4.5*  ALBUMIN 2.0*  AST 94*  ALT 130*  ALKPHOS 591*  BILITOT 6.1*  BILIDIR 4.3*  IBILI 1.8*   PT/INR No results for input(s): LABPROT, INR in the last 72 hours.  STUDIES: CT ABDOMEN PELVIS WO CONTRAST  Result Date: 05/19/2020 CLINICAL DATA:  Abdominal pain with nausea in the setting of pancreatic neuroendocrine tumor EXAM: CT ABDOMEN AND PELVIS WITHOUT CONTRAST TECHNIQUE: Multidetector CT imaging of the abdomen and pelvis was performed following the standard protocol without IV contrast. COMPARISON:  February 08, 2020 FINDINGS: Lower chest: 4 mm LEFT lower lobe pulmonary nodule along the pleural surface not present on March of 2021. No consolidation. No pleural effusion. Hepatobiliary: Post placement of biliary stent. Despite presence of stent the common bile duct is more dilated than on the previous exam measuring up to 19 mm. Previously approximately 13 mm. Masslike areas in the pancreatic head are similar. There is been interval development of low attenuation in the pancreatic neck that was not present previously approximately 15 mm (image 28, series 2) Also further dilation of the distal pancreatic duct is suggested with increase in the soft tissue density in the mid body of the pancreas. This area measuring approximately 3.9 cm greatest thickness at the site of ductal transition where as on the prior study it measured approximately 3.5 cm. Extending from the upper margin of the dilated duct at the site of ductal transition is a low-attenuation ovoid area measuring water density at 5 by 3.1 cm, abutting the lesser curvature. (Image 19, series 2)  previously measuring approximately 3.9 x 3.0 cm in this  location peripancreatic stranding is mildly increased when compared to the prior study. Soft tissue lesion associated with LEFT adrenal contiguous with with increase in size (image 21, series 2) 2.6 x 2.7 cm, previously 2.5 x 2.4 cm. Pancreas: As above Spleen: Spleen grossly normal on noncontrast imaging. Adrenals/Urinary Tract: Adrenal gland on the RIGHT is normal. LEFT adrenal mass contiguous with the pancreas enlarging as described. Mild perinephric stranding and in general anterior pararenal stranding on the LEFT. No hydronephrosis. Urinary bladder is unremarkable. Stomach/Bowel: Stomach with mild to moderate distension. Mild dilation of small bowel loops becomes moderately dilated in the mid small bowel at the level of the jejunum. There is peristaltic activity versus is transition in the distal jejunum at 3.6 cm caliber proximal to the site of narrowing with decompression of more distal small bowel loops. Colon is largely stool filled. Stool like material is present in the terminal ileum. Vascular/Lymphatic: Calcified atheromatous plaque of the abdominal aorta. No aneurysmal dilation. Adenopathy in the mesentery, largest measuring 11 mm is unchanged since the previous exam. Calcified mesenteric soft tissue (image 44, series 2) approximately 2.7 x 1.8 cm enlarged in short axis though difficult to measure given potential changes in mesenteric position over time. Reproductive: No adenopathy in the pelvis. Other: No free air.  No ascites. Musculoskeletal: No acute musculoskeletal process. Spinal degenerative changes. IMPRESSION: 1. 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 2. Worsening of biliary duct distension above the level of the stent since previous imaging from March of 2021 3. Increasing distension of small bowel in the proximal small bowel with site of  transition in the distal jejunum. Is unclear whether this represents vigorous peristaltic activity following contrast administration or developing small bowel obstruction or ileus. Subsequent follow-up based on symptoms with radiographs may be helpful. 4. Increasing soft tissue and increasing ductal dilation with at least 2 sites of transition in this patient with known neuroendocrine tumor suspicious for worsening tumor with enlarging adrenal involvement on the LEFT as discussed, at the site of previous more benign appearing LEFT adrenal neoplasm now inseparable from stranding and soft tissue in the region of the pancreatic tail. 5. Given appearance it is possible that direct extension of stranding from the pancreas towards the LEFT adrenal may relate to inflammation. Attention on follow-up. 6. Extensive portosystemic collaterals in the upper abdomen compatible with splenic venous compromise, not well assessed but this secondary finding is strongly suggestive of splenic venous compromise due to worsening tumor in the region of the pancreas. Electronically Signed   By: Zetta Bills M.D.   On: 05/19/2020 12:00      Impression / Plan:   Assessment: Principal Problem:   Abdominal pain Active Problems:   Typical atrial flutter (HCC)   Essential hypertension   Chronic obstructive pulmonary disease (HCC)   Diabetes type 2, uncontrolled (HCC)   CAD (coronary artery disease)   Acute kidney injury superimposed on chronic kidney disease ([place])   Transaminitis   Neoplasm related pain   Primary pancreatic neuroendocrine tumor   Protein-calorie malnutrition, severe   [name] is a 74 y.o. y/o male with pancreatic mass with obstructive jaundice and cholangitis.  The patient has positive blood cultures with a white cell count going from 8.8 on admission to 20.2.  The patient has thrombocytopenia with a platelet count of 49 and has been on Eliquis.  Plan:  This patient is in need of an ERCP  and has already eaten today.  The patient will be kept n.p.o. after midnight given IV fluids and treated with antibiotics.  The patient does not plan to have any interventions that would be a risk for bleeding therefore his Eliquis is not of a concern right now.  The patient is presently on meropenem.  I explained the procedure in depth to the patient and he agrees to having the procedure done.    Thank you for involving me in the care of this patient.      LOS: 1 day   Carlos Lame, MD  05/20/2020, 4:35 PM Pager [phone] 7am-5pm  Check AMION for 5pm -7am coverage and on weekends   Note: This dictation was prepared with Dragon dictation along with smaller phrase technology. Any transcriptional errors that result from this process are unintentional.

## 2020-05-21 ENCOUNTER — Inpatient Hospital Stay: Payer: Medicare Other

## 2020-05-21 ENCOUNTER — Encounter
Admission: EM | Disposition: A | Discharge status: Home / Self Care | Payer: Self-pay | Source: Home / Self Care | Attending: Pulmonary Disease

## 2020-05-21 ENCOUNTER — Inpatient Hospital Stay: Payer: Medicare Other | Admitting: Anesthesiology

## 2020-05-21 ENCOUNTER — Encounter: Payer: Self-pay | Admitting: Internal Medicine

## 2020-05-21 DIAGNOSIS — C259 Malignant neoplasm of pancreas, unspecified: Secondary | ICD-10-CM

## 2020-05-21 DIAGNOSIS — R7881 Bacteremia: Secondary | ICD-10-CM | POA: Diagnosis present

## 2020-05-21 DIAGNOSIS — A419 Sepsis, unspecified organism: Secondary | ICD-10-CM | POA: Diagnosis present

## 2020-05-21 HISTORY — PX: ENDOSCOPIC RETROGRADE CHOLANGIOPANCREATOGRAPHY (ERCP) WITH PROPOFOL: SHX5810

## 2020-05-21 LAB — BLOOD GAS, ARTERIAL
Acid-base deficit: 15.1 mmol/L — ABNORMAL HIGH (ref 0.0–2.0)
Bicarbonate: 14 mmol/L — ABNORMAL LOW (ref 20.0–28.0)
FIO2: 0.4
MECHVT: 550 mL
O2 Saturation: 86.3 %
PEEP: 5 cmH2O
Patient temperature: 37
RATE: 16 resp/min
pCO2 arterial: 45 mmHg (ref 32.0–48.0)
pH, Arterial: 7.1 — CL (ref 7.350–7.450)
pO2, Arterial: 71 mmHg — ABNORMAL LOW (ref 83.0–108.0)

## 2020-05-21 LAB — BASIC METABOLIC PANEL
Anion gap: 11 (ref 5–15)
BUN: 67 mg/dL — ABNORMAL HIGH (ref 8–23)
CO2: 15 mmol/L — ABNORMAL LOW (ref 22–32)
Calcium: 7.6 mg/dL — ABNORMAL LOW (ref 8.9–10.3)
Chloride: 108 mmol/L (ref 98–111)
Creatinine, Ser: 4.32 mg/dL — ABNORMAL HIGH (ref 0.61–1.24)
GFR calc Af Amer: 14 mL/min — ABNORMAL LOW (ref >60)
GFR calc non Af Amer: 12 mL/min — ABNORMAL LOW (ref >60)
Glucose, Bld: 125 mg/dL — ABNORMAL HIGH (ref 70–99)
Potassium: 4.8 mmol/L (ref 3.5–5.1)
Sodium: 134 mmol/L — ABNORMAL LOW (ref 135–145)

## 2020-05-21 LAB — CBC
HCT: 33.4 % — ABNORMAL LOW (ref 39.0–52.0)
Hemoglobin: 11 g/dL — ABNORMAL LOW (ref 13.0–17.0)
MCH: 30.1 pg (ref 26.0–34.0)
MCHC: 32.9 g/dL (ref 30.0–36.0)
MCV: 91.3 fL (ref 80.0–100.0)
Platelets: 81 10*3/uL — ABNORMAL LOW (ref 150–400)
RBC: 3.66 MIL/uL — ABNORMAL LOW (ref 4.22–5.81)
RDW: 14.6 % (ref 11.5–15.5)
WBC: 46.9 10*3/uL — ABNORMAL HIGH (ref 4.0–10.5)
nRBC: 0.2 % (ref 0.0–0.2)

## 2020-05-21 LAB — COMPREHENSIVE METABOLIC PANEL
ALT: 161 U/L — ABNORMAL HIGH (ref 0–44)
AST: 186 U/L — ABNORMAL HIGH (ref 15–41)
Albumin: 2 g/dL — ABNORMAL LOW (ref 3.5–5.0)
Alkaline Phosphatase: 650 U/L — ABNORMAL HIGH (ref 38–126)
Anion gap: 6 (ref 5–15)
BUN: 63 mg/dL — ABNORMAL HIGH (ref 8–23)
CO2: 18 mmol/L — ABNORMAL LOW (ref 22–32)
Calcium: 7.6 mg/dL — ABNORMAL LOW (ref 8.9–10.3)
Chloride: 109 mmol/L (ref 98–111)
Creatinine, Ser: 3.86 mg/dL — ABNORMAL HIGH (ref 0.61–1.24)
GFR calc Af Amer: 17 mL/min — ABNORMAL LOW (ref >60)
GFR calc non Af Amer: 14 mL/min — ABNORMAL LOW (ref >60)
Glucose, Bld: 169 mg/dL — ABNORMAL HIGH (ref 70–99)
Potassium: 4.7 mmol/L (ref 3.5–5.1)
Sodium: 133 mmol/L — ABNORMAL LOW (ref 135–145)
Total Bilirubin: 8.2 mg/dL — ABNORMAL HIGH (ref 0.3–1.2)
Total Protein: 4.5 g/dL — ABNORMAL LOW (ref 6.5–8.1)

## 2020-05-21 LAB — CBC WITH DIFFERENTIAL/PLATELET
Abs Immature Granulocytes: 4.07 10*3/uL — ABNORMAL HIGH (ref 0.00–0.07)
Basophils Absolute: 0.1 10*3/uL (ref 0.0–0.1)
Basophils Relative: 0 %
Eosinophils Absolute: 0 10*3/uL (ref 0.0–0.5)
Eosinophils Relative: 0 %
HCT: 27.3 % — ABNORMAL LOW (ref 39.0–52.0)
Hemoglobin: 8.8 g/dL — ABNORMAL LOW (ref 13.0–17.0)
Immature Granulocytes: 16 %
Lymphocytes Relative: 2 %
Lymphs Abs: 0.4 10*3/uL — ABNORMAL LOW (ref 0.7–4.0)
MCH: 29.9 pg (ref 26.0–34.0)
MCHC: 32.2 g/dL (ref 30.0–36.0)
MCV: 92.9 fL (ref 80.0–100.0)
Monocytes Absolute: 1.7 10*3/uL — ABNORMAL HIGH (ref 0.1–1.0)
Monocytes Relative: 7 %
Neutro Abs: 19.6 10*3/uL — ABNORMAL HIGH (ref 1.7–7.7)
Neutrophils Relative %: 75 %
Platelets: 52 10*3/uL — ABNORMAL LOW (ref 150–400)
RBC: 2.94 MIL/uL — ABNORMAL LOW (ref 4.22–5.81)
RDW: 14.6 % (ref 11.5–15.5)
Smear Review: NORMAL
WBC: 25.8 10*3/uL — ABNORMAL HIGH (ref 4.0–10.5)
nRBC: 0.1 % (ref 0.0–0.2)

## 2020-05-21 LAB — GLUCOSE, CAPILLARY
Glucose-Capillary: 109 mg/dL — ABNORMAL HIGH (ref 70–99)
Glucose-Capillary: 128 mg/dL — ABNORMAL HIGH (ref 70–99)
Glucose-Capillary: 148 mg/dL — ABNORMAL HIGH (ref 70–99)
Glucose-Capillary: 156 mg/dL — ABNORMAL HIGH (ref 70–99)

## 2020-05-21 LAB — PROTIME-INR
INR: 1.3 — ABNORMAL HIGH (ref 0.8–1.2)
Prothrombin Time: 15.6 seconds — ABNORMAL HIGH (ref 11.4–15.2)

## 2020-05-21 LAB — MRSA PCR SCREENING: MRSA by PCR: NEGATIVE

## 2020-05-21 LAB — PROCALCITONIN: Procalcitonin: 42.46 ng/mL

## 2020-05-21 SURGERY — ENDOSCOPIC RETROGRADE CHOLANGIOPANCREATOGRAPHY (ERCP) WITH PROPOFOL
Anesthesia: General

## 2020-05-21 MED ORDER — AMIODARONE HCL IN DEXTROSE 360-4.14 MG/200ML-% IV SOLN
INTRAVENOUS | Status: AC
  Administered 2020-05-21: 60 mg/h via INTRAVENOUS
  Filled 2020-05-21: qty 200

## 2020-05-21 MED ORDER — SODIUM BICARBONATE 8.4 % IV SOLN: 50.0000 meq | Freq: Once | INTRAVENOUS | Status: AC

## 2020-05-21 MED ORDER — SODIUM BICARBONATE 8.4 % IV SOLN
INTRAVENOUS | Status: AC
  Administered 2020-05-21: 50 meq via INTRAVENOUS
  Filled 2020-05-21: qty 50

## 2020-05-21 MED ORDER — METOPROLOL TARTRATE 5 MG/5ML IV SOLN
INTRAVENOUS | Status: AC
  Administered 2020-05-21: 5 mg
  Filled 2020-05-21: qty 5

## 2020-05-21 MED ORDER — CHLORHEXIDINE GLUCONATE 0.12% ORAL RINSE (MEDLINE KIT)
15.0000 mL | Freq: Two times a day (BID) | OROMUCOSAL | Status: DC
  Administered 2020-05-21 – 2020-05-24 (×8): 15 mL via OROMUCOSAL

## 2020-05-21 MED ORDER — MIDAZOLAM HCL 2 MG/2ML IJ SOLN
2.0000 mg | INTRAMUSCULAR | Status: DC | PRN
  Administered 2020-05-21 – 2020-05-23 (×4): 2 mg via INTRAVENOUS
  Filled 2020-05-21 (×4): qty 2

## 2020-05-21 MED ORDER — ETOMIDATE 2 MG/ML IV SOLN
INTRAVENOUS | Status: DC | PRN
Start: 2020-05-21 — End: 2020-05-21
  Administered 2020-05-21: 12 mg via INTRAVENOUS

## 2020-05-21 MED ORDER — NOREPINEPHRINE 4 MG/250ML-% IV SOLN
INTRAVENOUS | Status: AC
  Filled 2020-05-21: qty 250

## 2020-05-21 MED ORDER — CHLORHEXIDINE GLUCONATE CLOTH 2 % EX PADS
6.0000 | MEDICATED_PAD | Freq: Every day | CUTANEOUS | Status: DC
  Administered 2020-05-22 – 2020-05-26 (×4): 6 via TOPICAL

## 2020-05-21 MED ORDER — ESMOLOL HCL 100 MG/10ML IV SOLN
INTRAVENOUS | Status: DC | PRN
Start: 2020-05-21 — End: 2020-05-21
  Administered 2020-05-21 (×2): 30 mg via INTRAVENOUS

## 2020-05-21 MED ORDER — PHENYLEPHRINE HCL (PRESSORS) 10 MG/ML IV SOLN
INTRAVENOUS | Status: DC | PRN
  Administered 2020-05-21: 200 ug via INTRAVENOUS
  Administered 2020-05-21: 100 ug via INTRAVENOUS

## 2020-05-21 MED ORDER — SODIUM CHLORIDE 0.9 % IV SOLN: 250.0000 mL | INTRAVENOUS | Status: DC

## 2020-05-21 MED ORDER — PHENYLEPHRINE HCL (PRESSORS) 10 MG/ML IV SOLN
INTRAVENOUS | Status: AC
  Filled 2020-05-21: qty 1

## 2020-05-21 MED ORDER — VASOPRESSIN 20 UNITS/100 ML INFUSION FOR SHOCK
0.0000 [IU]/min | INTRAVENOUS | Status: DC
  Administered 2020-05-21 – 2020-05-23 (×4): 0.03 [IU]/min via INTRAVENOUS
  Filled 2020-05-21 (×4): qty 100

## 2020-05-21 MED ORDER — INSULIN ASPART 100 UNIT/ML ~~LOC~~ SOLN
0.0000 [IU] | SUBCUTANEOUS | Status: DC
  Administered 2020-05-22: 1 [IU] via SUBCUTANEOUS
  Administered 2020-05-22: 2 [IU] via SUBCUTANEOUS
  Administered 2020-05-22 – 2020-05-24 (×7): 1 [IU] via SUBCUTANEOUS
  Administered 2020-05-25: 2 [IU] via SUBCUTANEOUS
  Administered 2020-05-25 (×3): 1 [IU] via SUBCUTANEOUS
  Filled 2020-05-21 (×12): qty 1

## 2020-05-21 MED ORDER — LACTATED RINGERS IV SOLN: INTRAVENOUS | Status: DC | PRN

## 2020-05-21 MED ORDER — NOREPINEPHRINE 4 MG/250ML-% IV SOLN
INTRAVENOUS | Status: AC
  Administered 2020-05-21: 10 ug/min via INTRAVENOUS
  Filled 2020-05-21: qty 500

## 2020-05-21 MED ORDER — ROCURONIUM BROMIDE 100 MG/10ML IV SOLN
INTRAVENOUS | Status: DC | PRN
  Administered 2020-05-21: 50 mg via INTRAVENOUS

## 2020-05-21 MED ORDER — ACETAMINOPHEN 650 MG RE SUPP
RECTAL | Status: AC
  Filled 2020-05-21: qty 1

## 2020-05-21 MED ORDER — ONDANSETRON HCL 4 MG/2ML IJ SOLN: 4.0000 mg | Freq: Once | INTRAMUSCULAR | Status: DC | PRN

## 2020-05-21 MED ORDER — NOREPINEPHRINE 4 MG/250ML-% IV SOLN
2.0000 ug/min | INTRAVENOUS | Status: DC
  Administered 2020-05-21: 10 ug/min via INTRAVENOUS

## 2020-05-21 MED ORDER — SUCCINYLCHOLINE CHLORIDE 20 MG/ML IJ SOLN
INTRAMUSCULAR | Status: DC | PRN
  Administered 2020-05-21 (×2): 100 mg via INTRAVENOUS

## 2020-05-21 MED ORDER — CHLORHEXIDINE GLUCONATE CLOTH 2 % EX PADS
6.0000 | MEDICATED_PAD | Freq: Every day | CUTANEOUS | Status: DC
  Administered 2020-05-21: 6 via TOPICAL

## 2020-05-21 MED ORDER — ORAL CARE MOUTH RINSE
15.0000 mL | OROMUCOSAL | Status: DC
  Administered 2020-05-21 – 2020-05-25 (×32): 15 mL via OROMUCOSAL

## 2020-05-21 MED ORDER — SUGAMMADEX SODIUM 200 MG/2ML IV SOLN
INTRAVENOUS | Status: DC | PRN
  Administered 2020-05-21: 127.8 mg via INTRAVENOUS
  Administered 2020-05-21: 73 mg via INTRAVENOUS

## 2020-05-21 MED ORDER — PROPOFOL 10 MG/ML IV BOLUS
INTRAVENOUS | Status: DC | PRN
  Administered 2020-05-21: 100 mg via INTRAVENOUS

## 2020-05-21 MED ORDER — EPHEDRINE 5 MG/ML INJ
INTRAVENOUS | Status: AC
  Filled 2020-05-21: qty 10

## 2020-05-21 MED ORDER — STERILE WATER FOR INJECTION IV SOLN
INTRAVENOUS | Status: DC
  Filled 2020-05-21: qty 150
  Filled 2020-05-21 (×7): qty 850
  Filled 2020-05-21 (×2): qty 150
  Filled 2020-05-21 (×2): qty 850

## 2020-05-21 MED ORDER — PROPOFOL 10 MG/ML IV BOLUS
INTRAVENOUS | Status: AC
  Filled 2020-05-21: qty 20

## 2020-05-21 MED ORDER — FENTANYL BOLUS VIA INFUSION
50.0000 ug | INTRAVENOUS | Status: DC | PRN
  Administered 2020-05-23: 50 ug via INTRAVENOUS
  Filled 2020-05-21: qty 50

## 2020-05-21 MED ORDER — VASOPRESSIN 20 UNIT/ML IV SOLN
INTRAVENOUS | Status: DC | PRN
  Administered 2020-05-21 (×2): 1 [IU] via INTRAVENOUS

## 2020-05-21 MED ORDER — ETOMIDATE 2 MG/ML IV SOLN
INTRAVENOUS | Status: AC
  Filled 2020-05-21: qty 10

## 2020-05-21 MED ORDER — METOPROLOL TARTRATE 5 MG/5ML IV SOLN: 5.0000 mg | Freq: Once | INTRAVENOUS | Status: AC

## 2020-05-21 MED ORDER — PHENYLEPHRINE HCL-NACL 10-0.9 MG/250ML-% IV SOLN: 0.0000 ug/min | INTRAVENOUS | Status: DC

## 2020-05-21 MED ORDER — AMIODARONE HCL IN DEXTROSE 360-4.14 MG/200ML-% IV SOLN
30.0000 mg/h | INTRAVENOUS | Status: DC
  Administered 2020-05-21: 30 mg/h via INTRAVENOUS

## 2020-05-21 MED ORDER — FENTANYL CITRATE (PF) 100 MCG/2ML IJ SOLN: 25.0000 ug | INTRAMUSCULAR | Status: DC | PRN

## 2020-05-21 MED ORDER — FENTANYL 2500MCG IN NS 250ML (10MCG/ML) PREMIX INFUSION
0.0000 ug/h | INTRAVENOUS | Status: DC
  Administered 2020-05-21: 100 ug/h via INTRAVENOUS
  Administered 2020-05-21: 400 ug/h via INTRAVENOUS
  Administered 2020-05-22: 350 ug/h via INTRAVENOUS
  Administered 2020-05-22: 400 ug/h via INTRAVENOUS
  Administered 2020-05-22: 350 ug/h via INTRAVENOUS
  Administered 2020-05-23: 300 ug/h via INTRAVENOUS
  Filled 2020-05-21 (×6): qty 250

## 2020-05-21 MED ORDER — HYDROCORTISONE NA SUCCINATE PF 100 MG IJ SOLR
50.0000 mg | Freq: Four times a day (QID) | INTRAMUSCULAR | Status: DC
  Administered 2020-05-21 – 2020-05-24 (×12): 50 mg via INTRAVENOUS
  Filled 2020-05-21 (×12): qty 2

## 2020-05-21 MED ORDER — NOREPINEPHRINE 16 MG/250ML-% IV SOLN
0.0000 ug/min | INTRAVENOUS | Status: DC
  Administered 2020-05-21: 15 ug/min via INTRAVENOUS
  Administered 2020-05-22: 6 ug/min via INTRAVENOUS
  Filled 2020-05-21 (×2): qty 250

## 2020-05-21 MED ORDER — LACTATED RINGERS IV SOLN: Freq: Once | INTRAVENOUS | Status: AC

## 2020-05-21 MED ORDER — SUCCINYLCHOLINE CHLORIDE 200 MG/10ML IV SOSY
PREFILLED_SYRINGE | INTRAVENOUS | Status: AC
  Filled 2020-05-21: qty 10

## 2020-05-21 MED ORDER — AMIODARONE HCL IN DEXTROSE 360-4.14 MG/200ML-% IV SOLN: 60.0000 mg/h | INTRAVENOUS | Status: AC

## 2020-05-21 MED ORDER — VASOPRESSIN 20 UNIT/ML IV SOLN
INTRAVENOUS | Status: AC
  Filled 2020-05-21: qty 1

## 2020-05-21 MED ORDER — EPHEDRINE SULFATE 50 MG/ML IJ SOLN
INTRAMUSCULAR | Status: DC | PRN
  Administered 2020-05-21 (×2): 10 mg via INTRAVENOUS

## 2020-05-21 MED ORDER — ACETAMINOPHEN 650 MG RE SUPP
650.0000 mg | Freq: Once | RECTAL | Status: AC
  Administered 2020-05-21: 650 mg via RECTAL
  Filled 2020-05-21: qty 1

## 2020-05-21 NOTE — Transfer of Care (Signed)
Immediate Anesthesia Transfer of Care Note  Patient: [name]  Procedure(s) Performed: ENDOSCOPIC RETROGRADE CHOLANGIOPANCREATOGRAPHY (ERCP) WITH PROPOFOL (N/A )  Patient Location: ICU  Anesthesia Type:General  Level of Consciousness: drowsy, lethargic and responds to stimulation  Airway & Oxygen Therapy: Patient re-intubated and Patient remains intubated per anesthesia plan  Post-op Assessment: Report given to RN and Post -op Vital signs reviewed and stable  Post vital signs: Reviewed and stable  Last Vitals:  Vitals Value Taken Time  BP 134/100 05/21/20 1700  Temp    Pulse    Resp 18 05/21/20 1702  SpO2    Vitals shown include unvalidated device data.  Last Pain:  Vitals:   05/21/20 1418  TempSrc: Oral  PainSc: 0-No pain         Complications: No complications documented.

## 2020-05-21 NOTE — Progress Notes (Signed)
Dr. Allen Norris performed ERCP.  Per Dr. Kayleen Memos (Anesthesiologist) wants patient to be transferred to ICU for further monitoring as patient remains intubated.  Spoke with Eulis Manly, RN (Nursing Supervisor) to inform her of ICU bed request.  Instructed to place a transfer order in for transfer from room 104 to ICU, which has been done.  Will speak with Hospitalist for order for an ICU bed.   Dr. Nicole Kindred notified and will place order.

## 2020-05-21 NOTE — Consult Note (Signed)
CRITICAL CARE PROGRESS NOTE    Name: [name] MRN: [id number] DOB: January 16, 1946     LOS: 2   SUBJECTIVE FINDINGS & SIGNIFICANT EVENTS    Patient description:  [name] is a 74 year old male with a past medical history significant for atrial flutter s/p DCCV x 3 and ablation in 2017/2018, anticoagulated with Eliquis, coronary artery disease detected by chest CT in 2020, HFpEF, oxygen dependent COPD, hypothyroidism, type 2 diabetes, hypertension, and recently diagnosed pancreatic cancer who presented to the ED on 05/18/20 for epigastric pain with associated nausea and vomiting. Abdominal CT revealed pancreatitis with a presumed pseudocyst with worsening of the biliary ductal dilatation.  Blood cultures were positive for  polymicrobial bacteremia.  Blood cultures having both Klebsiella and E. 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  Course complicated by renal failure and initial dialysis need, GI bleed and pneumonia leading to septic shock.  Subsequently improved and off dialysis.  ERCP performed on 3/30 showed severe localized biliary stricture and underwent sphincterotomy with balloon sphincteroplasty.  He also had a biliary stent placement.  He also had a pancreatic duct stent placement he completed course of Zosyn post procedure.  The brushings were positive for tumor . Post ERCP patient was unable to be extubated and PCCM consult placed for further evaluation and mgt. On arrival patient is in [place].  He received esmolol prior to arrival without imrovement in HR which was >150. He then received 5lopressor and amiodarone gtt for rate control  Lines/tubes : Airway 7 mm (Active)  Secured at (cm) 22 cm 05/21/20 1645  Measured From Lips 05/21/20 [place] 05/21/20 1645  Secured By [employer] 05/21/20 1645  Cuff Pressure (cm H2O) 26 cm H2O 05/21/20 1645  Site Condition Cool;Dry 05/21/20 1645    Microbiology/Sepsis markers: Results for orders placed or performed during the hospital encounter of 05/18/20  SARS Coronavirus 2 by RT PCR (hospital order, performed in [facility] hospital lab) Nasopharyngeal Urine, Clean Catch     Status: None   Collection Time: 05/18/20 11:30 PM   Specimen: Urine, Clean Catch; Nasopharyngeal  Result Value Ref Range Status   SARS Coronavirus 2 NEGATIVE NEGATIVE Final    Comment: (NOTE) SARS-CoV-2 target nucleic acids are NOT DETECTED.  The SARS-CoV-2 RNA is generally detectable in upper and lower respiratory specimens during the acute phase of infection. The lowest concentration of SARS-CoV-2 viral copies this assay can detect is 250 copies / mL. A negative result does not preclude SARS-CoV-2 infection and should not be used as the sole basis for treatment or other patient management decisions.  A negative result may occur with improper specimen collection / handling, submission of specimen other than nasopharyngeal swab, presence of viral mutation(s) within the areas targeted by this assay, and inadequate number of viral copies (<250 copies / mL). A negative result must be combined with clinical observations, patient history, and epidemiological information.  Fact Sheet for Patients:   StrictlyIdeas.no  Fact Sheet for Healthcare Providers: BankingDealers.co.za  This test is not yet approved or  cleared by the [place] FDA and has been authorized for detection and/or diagnosis of SARS-CoV-2 by FDA under an Emergency Use Authorization (EUA).  This EUA will remain in effect (meaning this test can be used) for the duration of the COVID-19 declaration under Section 564(b)(1) of the Act, 21 U.S.C. section 360bbb-3(b)(1), unless  the authorization is terminated or revoked sooner.  Performed at [facility], [address] [place], [place] [address]   CULTURE, BLOOD (ROUTINE X 2) w Reflex to ID Panel     Status: Abnormal (Preliminary result)   Collection Time: 05/19/20  3:02 PM   Specimen: BLOOD  Result Value Ref Range Status   Specimen Description   Final    BLOOD RIGHT ANTECUBITAL Performed at [facility], [address] [place], [place] [address]    Special Requests   Final    BOTTLES DRAWN AEROBIC AND ANAEROBIC Blood Culture adequate volume Performed at [facility], [address], [place], [place] [address]    Culture  Setup Time   Final    GRAM NEGATIVE RODS IN BOTH AEROBIC AND ANAEROBIC BOTTLES CRITICAL RESULT CALLED TO, READ BACK BY AND VERIFIED WITH: Hart Robinsons PHARMD [id number] 05/20/20 HNM    Culture (A)  Final    KLEBSIELLA PNEUMONIAE ESCHERICHIA COLI CULTURE REINCUBATED FOR BETTER GROWTH Performed at [facility], [place] [address] [place], [place] [address]    Report Status PENDING  Incomplete  Blood Culture ID Panel (Reflexed)     Status: Abnormal   Collection Time: 05/19/20  3:02 PM  Result Value Ref Range Status   Enterococcus species NOT DETECTED NOT DETECTED Final   Listeria monocytogenes NOT DETECTED NOT DETECTED Final   Staphylococcus species NOT DETECTED NOT DETECTED Final   Staphylococcus aureus (BCID) NOT DETECTED NOT DETECTED Final   Streptococcus species NOT DETECTED NOT DETECTED Final   Streptococcus agalactiae NOT DETECTED NOT DETECTED Final   Streptococcus pneumoniae NOT DETECTED NOT DETECTED Final   Streptococcus pyogenes NOT DETECTED NOT DETECTED Final   Acinetobacter baumannii NOT DETECTED NOT DETECTED Final   Enterobacteriaceae species DETECTED (A) NOT DETECTED Final    Comment: CRITICAL RESULT CALLED TO, READ BACK BY AND VERIFIED WITH:  SCOTT HALL PHARMD [id number] 05/20/20 HNM    Enterobacter cloacae complex NOT DETECTED NOT DETECTED Final     Escherichia coli DETECTED (A) NOT DETECTED Final    Comment: CRITICAL RESULT CALLED TO, READ BACK BY AND VERIFIED WITH: SCOTT HALL PHARMD [id number] 05/20/20 HNM    Klebsiella oxytoca NOT DETECTED NOT DETECTED Final   Klebsiella pneumoniae DETECTED (A) NOT DETECTED Final    Comment: CRITICAL RESULT CALLED TO, READ BACK BY AND VERIFIED WITH: SCOTT HALL PHARMD [id number] 05/20/20 HNM    Proteus species NOT DETECTED NOT DETECTED Final   Serratia marcescens NOT DETECTED NOT DETECTED Final   Carbapenem resistance NOT DETECTED NOT DETECTED Final   Haemophilus influenzae NOT DETECTED NOT DETECTED Final   Neisseria meningitidis NOT DETECTED NOT DETECTED Final   Pseudomonas aeruginosa NOT DETECTED NOT DETECTED Final   Candida albicans NOT DETECTED NOT DETECTED Final   Candida glabrata NOT DETECTED NOT DETECTED Final   Candida krusei NOT DETECTED NOT DETECTED Final   Candida parapsilosis NOT DETECTED NOT DETECTED Final   Candida tropicalis NOT DETECTED NOT DETECTED Final    Comment: Performed at [facility], [address] [place], [place] [address]  CULTURE, BLOOD (ROUTINE X 2) w Reflex to ID Panel     Status: Abnormal (Preliminary result)   Collection Time: 05/19/20  3:14 PM   Specimen: BLOOD  Result Value Ref Range Status   Specimen Description   Final    BLOOD BLOOD RIGHT HAND Performed at [facility], [address], [place], [place] [address]    Special Requests   Final    BOTTLES DRAWN AEROBIC AND ANAEROBIC Blood  Culture adequate volume Performed at [facility], [address] [place], [place] [address]    Culture  Setup Time   Final    GRAM NEGATIVE RODS IN BOTH AEROBIC AND ANAEROBIC BOTTLES CRITICAL VALUE NOTED.  VALUE IS CONSISTENT WITH PREVIOUSLY REPORTED AND CALLED VALUE. Performed at [facility], [address] [place], [place] [address]    Culture KLEBSIELLA PNEUMONIAE (A)  Final   Report Status PENDING  Incomplete    Anti-infectives:   Anti-infectives (From admission, onward)   Start     Dose/Rate Route Frequency Ordered Stop   05/20/20 0600  meropenem (MERREM) 1 g in sodium chloride 0.9 % 100 mL IVPB     Discontinue     1 g 200 mL/hr over 30 Minutes Intravenous Every 12 hours 05/20/20 0344         Consults: Treatment Team:  [name], MD Teodoro Spray, MD Ottie Glazier, MD       PAST MEDICAL HISTORY   Past Medical History:  Diagnosis Date  . Aortic atherosclerosis ([place])    As seen on 04/2019 CT  . Atrial flutter ([place])    a. s/p ablation 2017/2018 and DCCV 2015/2016/2019 b. 2017 echo EF 60-65%, no RWMA, mild MR,  . CAD (coronary artery disease)    3v CAD 04/2019 CT  . Carcinoid tumor    metastatic  . CKD (chronic kidney disease), stage III   . COPD (chronic obstructive pulmonary disease) (HCC)    PM oxygen  . Current occasional smoker    1-2 cigarettes / week  . Diabetes mellitus type 2, insulin dependent ([place])   . Heart failure with preserved ejection fraction ([place])    2017 echo EF 60-65%, no RWMA, mild MR,    . Hyperlipidemia   . Hypertension   . Pancreatitis   . Polycythemia      SURGICAL HISTORY   Past Surgical History:  Procedure Laterality Date  . A-FLUTTER ABLATION N/A 10/14/2017   Procedure: A-FLUTTER ABLATION;  Surgeon: Constance Haw, MD;  Location: [facility];  Service: Cardiovascular;  Laterality: N/A;  . CARDIOVERSION N/A 09/08/2017   Procedure: CARDIOVERSION;  Surgeon: [name], MD;  Location: [facility];  Service: Cardiovascular;  Laterality: N/A;  . CARDIOVERSION N/A 03/22/2018   Procedure: CARDIOVERSION;  Surgeon: [name], MD;  Location: [facility];  Service: Cardiovascular;  Laterality: N/A;  . COLONOSCOPY    . COLONOSCOPY WITH PROPOFOL N/A 10/31/2019   Procedure: COLONOSCOPY WITH PROPOFOL;  Surgeon: Lin Landsman, MD;  Location: [facility] ENDOSCOPY;  Service: Gastroenterology;  Laterality: N/A;  . ELECTROPHYSIOLOGIC STUDY N/A 10/27/2016    Procedure: A-Flutter Ablation;  Surgeon: Deboraha Sprang, MD;  Location: [facility];  Service: Cardiovascular;  Laterality: N/A;  . EUS N/A 12/22/2017   Procedure: FULL UPPER ENDOSCOPIC ULTRASOUND (EUS) RADIAL;  Surgeon: Reita Cliche, MD;  Location: [facility] ENDOSCOPY;  Service: Gastroenterology;  Laterality: N/A;  . TEE WITH CARDIOVERSION  12/16  . TEMPORARY DIALYSIS CATHETER N/A 02/08/2020   Procedure: TEMPORARY DIALYSIS CATHETER;  Surgeon: Algernon Huxley, MD;  Location: [facility];  Service: Cardiovascular;  Laterality: N/A;     FAMILY HISTORY   Family History  Problem Relation Age of Onset  . Hypertension Father      SOCIAL HISTORY   Social History   Tobacco Use  . Smoking status: Light Tobacco Smoker    Years: 45.00    Types: Cigarettes    Last  attempt to quit: 11/27/2012    Years since quitting: 7.4  . Smokeless tobacco: Never Used  Vaping Use  . Vaping Use: Never used  Substance Use Topics  . Alcohol use: Yes    Alcohol/week: 2.0 standard drinks    Types: 2 Cans of beer per week    Comment: beer every once a while once a monthnone last 24hrs  . Drug use: No     MEDICATIONS   Current Medication:  Current Facility-Administered Medications:  .  0.9 %  sodium chloride infusion, , Intravenous, PRN, Swayze, Ava, DO, Stopped at 05/20/20 0919 .  0.9 %  sodium chloride infusion, , Intravenous, Continuous, Swayze, Ava, DO, Last Rate: 125 mL/hr at 05/21/20 1656, New Bag at 05/21/20 1656 .  0.9 %  sodium chloride infusion, 250 mL, Intravenous, Continuous, Ridley Schewe, MD .  acetaminophen (TYLENOL) 650 MG suppository, , , ,  .  acetaminophen (TYLENOL) tablet 1,000 mg, 1,000 mg, Oral, Q6H PRN, Sharion Settler, NP, 1,000 mg at 05/20/20 0000 .  amiodarone (NEXTERONE PREMIX) 360-4.14 MG/200ML-% (1.8 mg/mL) IV infusion, 60 mg/hr, Intravenous, Continuous, Abdiaziz Klahn, MD, Last Rate: 33.3 mL/hr at 05/21/20 1713, 60 mg/hr at 05/21/20 1713 .  amiodarone  (NEXTERONE PREMIX) 360-4.14 MG/200ML-% (1.8 mg/mL) IV infusion, 30 mg/hr, Intravenous, Continuous, Mathan Darroch, MD .  amiodarone (PACERONE) tablet 200 mg, 200 mg, Oral, Daily, Tu, Ching T, DO, 200 mg at 05/20/20 0825 .  chlorhexidine gluconate (MEDLINE KIT) (PERIDEX) 0.12 % solution 15 mL, 15 mL, Mouth Rinse, BID, Lanney Gins, Kassiah Mccrory, MD .  Chlorhexidine Gluconate Cloth 2 % PADS 6 each, 6 each, Topical, Daily, Ottie Glazier, MD, 6 each at 05/21/20 1741 .  feeding supplement (BOOST / RESOURCE BREEZE) liquid 1 Container, 1 Container, Oral, TID BM, Swayze, Ava, DO, 1 Container at 05/20/20 2103 .  fentaNYL (SUBLIMAZE) bolus via infusion 50 mcg, 50 mcg, Intravenous, Q15 min PRN, Lanney Gins, Joel Cowin, MD .  fentaNYL 2586mg in NS 2578m(1056mml) infusion-PREMIX, 0-400 mcg/hr, Intravenous, Continuous, Kensey Luepke, MD, Last Rate: 10 mL/hr at 05/21/20 1707, 100 mcg/hr at 05/21/20 1707 .  insulin aspart (novoLOG) injection 0-9 Units, 0-9 Units, Subcutaneous, TID WC, Tu, Ching T, DO, 1 Units at 05/21/20 1225 .  insulin glargine (LANTUS) injection 12 Units, 12 Units, Subcutaneous, QHS, Tu, Ching T, DO, 12 Units at 05/19/20 2123 .  levothyroxine (SYNTHROID) tablet 25 mcg, 25 mcg, Oral, q morning - 10a, Tu, Ching T, DO, 25 mcg at 05/21/20 0615 .  MEDLINE mouth rinse, 15 mL, Mouth Rinse, 10 times per day, AleOttie GlazierD .  meropenem (MERREM) 1 g in sodium chloride 0.9 % 100 mL IVPB, 1 g, Intravenous, Q12H, Hall, Scott A, RPH, Last Rate: 200 mL/hr at 05/21/20 0615, 1 g at 05/21/20 0615 .  midazolam (VERSED) injection 2 mg, 2 mg, Intravenous, Q15 min PRN, AleOttie GlazierD, 2 mg at 05/21/20 1734 .  midodrine (PROAMATINE) tablet 10 mg, 10 mg, Oral, TID, Tu, Ching T, DO, 10 mg at 05/21/20 1020 .  mometasone-formoterol (DULERA) 100-5 MCG/ACT inhaler 2 puff, 2 puff, Inhalation, BID, Tu, Ching T, DO, 2 puff at 05/21/20 0859 .  morphine 2 MG/ML injection 1 mg, 1 mg, Intravenous, Q2H PRN, Tu, Ching T, DO, 1 mg at  05/20/20 1613 .  norepinephrine (LEVOPHED) 4-5 MG/250ML-% infusion SOLN, , , ,  .  norepinephrine (LEVOPHED) 4-5 MG/250ML-% infusion SOLN, , , ,  .  norepinephrine (LEVOPHED) 4mg26m 250mL37mmix infusion, 2-10 mcg/min, Intravenous, Titrated, AleskOttie Glazier.  ondansetron (ZOFRAN) injection 4 mg, 4 mg, Intravenous, Q6H PRN, Tu, Ching T, DO, 4 mg at 05/21/20 1252 .  pantoprazole (PROTONIX) EC tablet 40 mg, 40 mg, Oral, Daily, Tu, Ching T, DO, 40 mg at 05/20/20 0825 .  rosuvastatin (CRESTOR) tablet 10 mg, 10 mg, Oral, Daily, Swayze, Ava, DO, 10 mg at 05/20/20 1814 .  vitamin B-12 (CYANOCOBALAMIN) tablet 500 mcg, 500 mcg, Oral, Daily, Tu, Ching T, DO, 500 mcg at 05/20/20 0825    ALLERGIES   Levaquin [levofloxacin in d5w]    REVIEW OF SYSTEMS     Unable to obtain due to mechanical intubation   PHYSICAL EXAMINATION   Vital Signs: Temp:  [97.8 F (36.6 C)-101.6 F (38.7 C)] 97.8 F (36.6 C) (07/07 1741) Pulse Rate:  [64-132] 132 (07/07 1741) Resp:  [16-27] 27 (07/07 1741) BP: (59-125)/(47-62) 59/47 (07/07 1741) SpO2:  [93 %-100 %] 95 % (07/07 1741) FiO2 (%):  [40 %] 40 % (07/07 1645)  GENERAL:chronically ill appearing on MV HEAD: Normocephalic, atraumatic.  EYES: Pupils equal, round, reactive to light.  No scleral icterus.  MOUTH: Moist mucosal membrane. NECK: Supple. No thyromegaly. No nodules. No JVD.  PULMONARY: MV sounds with mild rhonchi CARDIOVASCULAR: S1 and S2. Regular rate and rhythm. No murmurs, rubs, or gallops.  GASTROINTESTINAL: Soft, nontender, non-distended. No masses. Positive bowel sounds. No hepatosplenomegaly.  MUSCULOSKELETAL: No swelling, clubbing, or edema.  NEUROLOGIC: GCS4T SKIN:intact,warm,dry   PERTINENT DATA     Infusions: . sodium chloride Stopped (05/20/20 0919)  . sodium chloride 125 mL/hr at 05/21/20 1656  . sodium chloride    . amiodarone 60 mg/hr (05/21/20 1713)  . amiodarone    . fentaNYL infusion INTRAVENOUS 100 mcg/hr  (05/21/20 1707)  . meropenem (MERREM) IV 1 g (05/21/20 0615)  . norepinephrine    . norepinephrine    . norepinephrine (LEVOPHED) Adult infusion     Scheduled Medications: . acetaminophen      . amiodarone  200 mg Oral Daily  . chlorhexidine gluconate (MEDLINE KIT)  15 mL Mouth Rinse BID  . Chlorhexidine Gluconate Cloth  6 each Topical Daily  . feeding supplement  1 Container Oral TID BM  . insulin aspart  0-9 Units Subcutaneous TID WC  . insulin glargine  12 Units Subcutaneous QHS  . levothyroxine  25 mcg Oral q morning - 10a  . mouth rinse  15 mL Mouth Rinse 10 times per day  . midodrine  10 mg Oral TID  . mometasone-formoterol  2 puff Inhalation BID  . pantoprazole  40 mg Oral Daily  . rosuvastatin  10 mg Oral Daily  . vitamin B-12  500 mcg Oral Daily   PRN Medications: sodium chloride, acetaminophen, fentaNYL, midazolam, morphine injection, ondansetron (ZOFRAN) IV Hemodynamic parameters:   Intake/Output: 07/06 0701 - 07/07 0700 In: 466.2 [P.O.:100; I.V.:136.2; IV Piggyback:230] Out: 200 [Urine:200]  Ventilator  Settings: Vent Mode: PRVC FiO2 (%):  [40 %] 40 % Set Rate:  [16 bmp-18 bmp] 18 bmp Vt Set:  [55 mL] 55 mL PEEP:  [5 cmH20] 5 cmH20    LAB RESULTS:  Basic Metabolic Panel: Recent Labs  Lab 05/18/20 1747 05/18/20 1747 05/19/20 0533 05/19/20 0533 05/20/20 0439 05/21/20 0741  NA 135  --  139  --  133* 133*  K 3.9   < > 4.2   < > 3.3* 4.7  CL 107  --  111  --  108 109  CO2 16*  --  20*  --  16* 18*  GLUCOSE 221*  --  154*  --  154* 169*  BUN 36*  --  38*  --  46* 63*  CREATININE 2.77*  --  2.73*  --  3.25* 3.86*  CALCIUM 9.1  --  8.6*  --  8.1* 7.6*   < > = values in this interval not displayed.   Liver Function Tests: Recent Labs  Lab 05/18/20 1747 05/19/20 0833 05/20/20 0439 05/21/20 0741  AST 87* 99* 94* 186*  ALT 149* 149* 130* 161*  ALKPHOS 844* 757* 591* 650*  BILITOT 3.5* 5.9* 6.1* 8.2*  PROT 6.5 6.0* 4.5* 4.5*  ALBUMIN 3.2* 2.8*  2.0* 2.0*   Recent Labs  Lab 05/18/20 1747 05/19/20 1502  LIPASE 100* 19   No results for input(s): AMMONIA in the last 168 hours. CBC: Recent Labs  Lab 05/18/20 1747 05/19/20 0533 05/20/20 0439 05/21/20 0741  WBC 8.8 13.6* 20.2* 25.8*  NEUTROABS  --   --   --  19.6*  HGB 11.9* 10.8* 10.0* 8.8*  HCT 35.1* 32.9* 30.5* 27.3*  MCV 90.2 90.9 91.0 92.9  PLT 168 106* 49* 52*   Cardiac Enzymes: No results for input(s): CKTOTAL, CKMB, CKMBINDEX, TROPONINI in the last 168 hours. BNP: Invalid input(s): POCBNP CBG: Recent Labs  Lab 05/20/20 1146 05/20/20 1715 05/20/20 2108 05/21/20 0751 05/21/20 1139  GLUCAP 147* 156* 103* 156* 128*       IMAGING RESULTS:  Imaging: DG C-Arm 1-60 Min-No Report  Result Date: 05/21/2020 Fluoroscopy was utilized by the requesting physician.  No radiographic interpretation.   @PROBHOSP @ DG C-Arm 1-60 Min-No Report  Result Date: 05/21/2020 Fluoroscopy was utilized by the requesting physician.  No radiographic interpretation.     ASSESSMENT AND PLAN    -Multidisciplinary rounds held today  Atypical flutter with rapid ventricular response -s/p amiodarone gtt due to refactory RVR post esmolol and metoprolol -cardiology is on case appreciate input    Cholangitis with obstuctive jaundice S/p ERCP with post operative failure to wean from MV -SBT when able  -light fentanyl gtt for goal RASS-1 ICU telemetry monitoring ID is on case - appreciate input - continue meropenem   Chronic cardiac comorbidities  atrial flutter s/p DCCV x 3 and ablation in 2017/2018 -on eliquis chronically - coronary artery disease  - HFpEF   COPD with chronic hypoxemia -hold DuoNEB while patient is in AFL -consider steroids since patient is hypotensive   Acute on chronic renal failure stage 3 - d/c non-essential nephrotoxic medications -nephrology consultation   Severe protein calorie malnutrition - bitemporal and peripheral muscle  wasting -albumin <2 Dietary nutritional consultation   Septic shock  - due to bacteremia with klebsiella and Ecoli -present on admission  -use vasopressors to keep MAP>65 -follow ABG and LA -follow up cultures -c/w meropenem    ID -continue IV abx as prescibed -follow up cultures  GI/Nutrition GI PROPHYLAXIS as indicated DIET-->TF's as tolerated Constipation protocol as indicated  ENDO - ICU hypoglycemic\Hyperglycemia protocol -check FSBS per protocol   ELECTROLYTES -follow labs as needed -replace as needed -pharmacy consultation   DVT/GI PRX ordered -SCDs  TRANSFUSIONS AS NEEDED MONITOR FSBS ASSESS the need for LABS as needed   Critical care provider statement:    Critical care time (minutes):  109   Critical care time was exclusive of:  Separately billable procedures and treating other patients   Critical care was necessary to treat or prevent imminent or life-threatening deterioration of the following conditions:  septic shock, cholangitis, AFL with RVR, multiple  comorbid conditions   Critical care was time spent personally by me on the following activities:  Development of treatment plan with patient or surrogate, discussions with consultants, evaluation of patient's response to treatment, examination of patient, obtaining history from patient or surrogate, ordering and performing treatments and interventions, ordering and review of laboratory studies and re-evaluation of patient's condition.  I assumed direction of critical care for this patient from another provider in my specialty: no    This document was prepared using Dragon voice recognition software and may include unintentional dictation errors.    Ottie Glazier, M.D.  Division of [place]

## 2020-05-21 NOTE — Procedures (Signed)
Central Venous Catheter Insertion Procedure Note  Erlin Gardella  [id number]  August 15, 1946  Date:05/21/20  Time:8:36 PM   Provider Performing:Rawleigh Rode D Dewaine Conger   Procedure: Insertion of Non-tunneled Central Venous Catheter(36556) with US guidance (30092)   Indication(s) Medication administration and Difficult access  Consent Unable to obtain consent due to emergent nature of procedure.  Anesthesia Topical only with 1% lidocaine   Timeout Verified patient identification, verified procedure, site/side was marked, verified correct patient position, special equipment/implants available, medications/allergies/relevant history reviewed, required imaging and test results available.  Sterile Technique Maximal sterile technique including full sterile barrier drape, hand hygiene, sterile gown, sterile gloves, mask, hair covering, sterile ultrasound probe cover (if used).  Procedure Description Area of catheter insertion was cleaned with chlorhexidine and draped in sterile fashion.  With real-time ultrasound guidance a central venous catheter was placed into the left internal jugular vein. Nonpulsatile blood flow and easy flushing noted in all ports.  The catheter was sutured in place and sterile dressing applied.  Complications/Tolerance None; patient tolerated the procedure well. Chest X-ray is ordered to verify placement for internal jugular or subclavian cannulation.   Chest x-ray is not ordered for femoral cannulation.  EBL Minimal  Specimen(s) None   Line was secured at the 20 cm mark.    BIOPATCH was applied to the insertion site.    Darel Hong, AGACNP-BC [facility] Pager: [phone]

## 2020-05-21 NOTE — Op Note (Addendum)
[facility] Patient Name: [name] Procedure Date: 05/21/2020 12:48 PM MRN: [id number] Account #: [id number] Date of Birth: 09/13/1946 Admit Type: Inpatient Age: 74 Room: [facility] ENDO ROOM 4 Gender: Male Note Status: Supervisor Override Procedure:             ERCP Indications:           Tumor involving entire pancreas Providers:             [name] MD, MD Referring MD:          [name], MD (Referring MD) Medicines:             General Anesthesia Complications:         No immediate complications. Procedure:             Pre-Anesthesia Assessment:                        - Prior to the procedure, a History and Physical was                         performed, and patient medications and allergies were                         reviewed. The patient's tolerance of previous                         anesthesia was also reviewed. The risks and benefits                         of the procedure and the sedation options and risks                         were discussed with the patient. All questions were                         answered, and informed consent was obtained. Prior                         Anticoagulants: The patient has taken Eliquis                         (apixaban). ASA Grade Assessment: III - A patient with                         severe systemic disease. After reviewing the risks and                         benefits, the patient was deemed in satisfactory                         condition to undergo the procedure.                        After obtaining informed consent, the scope was passed                         under direct vision. Throughout the procedure, the  patient's blood pressure, pulse, and oxygen                         saturations were monitored continuously. The was                         introduced through the mouth, and used to inject                         contrast into and used to inject  contrast into the                         bile duct. The ERCP was accomplished without                         difficulty. The patient tolerated the procedure well. Findings:      A biliary stent was visible on the scout film. Clip seen at the ampulla       from previous ERCP One stent was removed from the common bile duct using       a snare. One plastic stent originating in the common bile duct was       emerging from the major papilla. The stent was visibly occluded. The       bile duct was deeply cannulated with the short-nosed traction       sphincterotome. Contrast was injected. I personally interpreted the bile       duct images. There was brisk flow of contrast through the ducts. Image       quality was excellent. Contrast extended to the entire biliary tree. The       lower third of the main bile duct contained a single segmental stenosis.       A wire was passed into the biliary tree. The biliary tree was swept with       a 15 mm balloon starting at the bifurcation. Pus was swept from the       duct. One 10 Fr by 7 cm plastic stent with a single external flap and a       single internal flap was placed 5 cm into the common bile duct. Bile and       pus flowed through the stent. The stent was in good position. One 7 Fr       by 5 cm plastic stent with a single external flap and a single internal       flap was placed 4 cm into the common bile duct. Bile flowed through the       stent. The stent was in good position. Impression:            - One visibly occluded stent from the common bile duct                         was seen in the major papilla.                        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 The stricture  was malignant appearing.                        - One stent was removed from the common bile duct.                        - The biliary tree was swept and pus was  found.                        - One plastic stent was placed into the common bile                         duct.                        - One plastic stent was placed into the common bile                         duct. Recommendation:        - Return patient to ICU for ongoing care.                        - Clear liquid diet.                        - Watch for pancreatitis, bleeding, perforation, and                         cholangitis. Procedure Code(s):     --- Professional ---                        [id number], Endoscopic retrograde cholangiopancreatography                         (ERCP); with removal and exchange of stent(s), biliary                         or pancreatic duct, including pre- and post-dilation                         and guide wire passage, when performed, including                         sphincterotomy, when performed, each stent exchanged                        [id number], 42, Endoscopic retrograde                         cholangiopancreatography (ERCP); with removal and                         exchange of stent(s), biliary or pancreatic duct,                         including pre- and post-dilation and guide wire                         passage, when performed, including sphincterotomy,  when performed, each stent exchanged                        [phone], Endoscopic catheterization of the biliary                         ductal system, radiological supervision and                         interpretation Diagnosis Code(s):     --- Professional ---                        D49.0, Neoplasm of unspecified behavior of digestive                         system                        Z46.59, Encounter for fitting and adjustment of other                         gastrointestinal appliance and device                        K83.1, Obstruction of bile duct CPT copyright 2019 [employer]. All rights reserved. The codes documented in this report are  preliminary and upon coder review may  be revised to meet current compliance requirements. [name] MD, MD 05/21/2020 3:37:41 PM This report has been signed electronically. Number of Addenda: 0 Note Initiated On: 05/21/2020 12:48 PM Estimated Blood Loss:  Estimated blood loss: none.      [facility]

## 2020-05-21 NOTE — Anesthesia Procedure Notes (Signed)
Procedure Name: Intubation Date/Time: 05/21/2020 3:08 PM Performed by: Nelda Marseille, CRNA Pre-anesthesia Checklist: Patient identified, Patient being monitored, Timeout performed, Emergency Drugs available and Suction available Patient Re-evaluated:Patient Re-evaluated prior to induction Oxygen Delivery Method: Circle system utilized Preoxygenation: Pre-oxygenation with 100% oxygen Induction Type: IV induction Ventilation: Mask ventilation without difficulty Laryngoscope Size: Mac, 3 and McGraph Grade View: Grade I Tube type: Oral Tube size: 7.0 mm Number of attempts: 1 Airway Equipment and Method: Stylet and Video-laryngoscopy Placement Confirmation: ETT inserted through vocal cords under direct vision,  positive ETCO2 and breath sounds checked- equal and bilateral Secured at: 21 cm Tube secured with: Tape Dental Injury: Teeth and Oropharynx as per pre-operative assessment

## 2020-05-21 NOTE — Anesthesia Preprocedure Evaluation (Addendum)
Anesthesia Evaluation  Patient identified by MRN, date of birth, ID band Patient awake    Reviewed: Allergy & Precautions, H&P , NPO status , Patient's Chart, lab work & pertinent test results, reviewed documented beta blocker date and time   Airway Mallampati: II   Neck ROM: full    Dental  (+) Poor Dentition   Pulmonary COPD,  COPD inhaler, Current Smoker and Patient abstained from smoking.,    Pulmonary exam normal        Cardiovascular Exercise Tolerance: Good hypertension, On Medications + angina + CAD  Normal cardiovascular exam+ dysrhythmias Atrial Fibrillation  Rate:Tachycardia     Neuro/Psych negative neurological ROS  negative psych ROS   GI/Hepatic negative GI ROS, Neg liver ROS,   Endo/Other  negative endocrine ROSdiabetes  Renal/GU CRFRenal disease  negative genitourinary   Musculoskeletal   Abdominal   Peds  Hematology negative hematology ROS (+)   Anesthesia Other Findings Past Medical History: No date: Aortic atherosclerosis ([place])     Comment:  As seen on 04/2019 CT No date: Atrial flutter [facility])     Comment:  a. s/p ablation 2017/2018 and DCCV 2015/2016/2019 b.               2017 echo EF 60-65%, no RWMA, mild MR, No date: CAD (coronary artery disease)     Comment:  3v CAD 04/2019 CT No date: Carcinoid tumor     Comment:  metastatic No date: CKD (chronic kidney disease), stage III No date: COPD (chronic obstructive pulmonary disease) ([facility])     Comment:  PM oxygen No date: Current occasional smoker     Comment:  1-2 cigarettes / week No date: Diabetes mellitus type 2, insulin dependent ([place]) No date: Heart failure with preserved ejection fraction ([place])     Comment:  2017 echo EF 60-65%, no RWMA, mild MR,   No date: Hyperlipidemia No date: Hypertension No date: Pancreatitis No date: Polycythemia Past Surgical History: 10/14/2017: A-FLUTTER ABLATION; N/A     Comment:  Procedure: A-FLUTTER  ABLATION;  Surgeon: Constance Haw, MD;  Location: [facility];  Service:               Cardiovascular;  Laterality: N/A; 09/08/2017: CARDIOVERSION; N/A     Comment:  Procedure: CARDIOVERSION;  Surgeon: [name],               MD;  Location: [facility] ORS;  Service: Cardiovascular;                Laterality: N/A; 03/22/2018: CARDIOVERSION; N/A     Comment:  Procedure: CARDIOVERSION;  Surgeon: [name],               MD;  Location: [facility] ORS;  Service: Cardiovascular;                Laterality: N/A; No date: COLONOSCOPY 10/27/2016: ELECTROPHYSIOLOGIC STUDY; N/A     Comment:  Procedure: A-Flutter Ablation;  Surgeon: Deboraha Sprang,              MD;  Location: [place] [facility];  Service:               Cardiovascular;  Laterality: N/A; 12/22/2017: EUS; N/A     Comment:  Procedure: FULL UPPER ENDOSCOPIC ULTRASOUND (EUS)               RADIAL;  Surgeon: Cephas Darby,  Lenetta Quaker, MD;  Location: [facility]               ENDOSCOPY;  Service: Gastroenterology;  Laterality: N/A; 12/16: TEE WITH CARDIOVERSION   Reproductive/Obstetrics negative OB ROS                             Anesthesia Physical  Anesthesia Plan  ASA: IV  Anesthesia Plan: General   Post-op Pain Management:    Induction: Intravenous  PONV Risk Score and Plan:   Airway Management Planned: Oral ETT  Additional Equipment:   Intra-op Plan:   Post-operative Plan: Possible Post-op intubation/ventilation and Extubation in OR  Informed Consent: I have reviewed the patients History and Physical, chart, labs and discussed the procedure including the risks, benefits and alternatives for the proposed anesthesia with the patient or authorized representative who has indicated his/her understanding and acceptance.     Dental Advisory Given  Plan Discussed with: CRNA  Anesthesia Plan Comments: (Septic patient with recent hypotension.  Cardiology asked to weigh in on patient  for HR up to 140.Marland Kitchen Patient cleared by cardiology.  Patient is a high risk patient who may remain intubated post procedure.  Dr. Allen Norris has declared thisurgent.)       Anesthesia Quick Evaluation

## 2020-05-21 NOTE — Progress Notes (Signed)
PROGRESS NOTE    [name]   WSF:[id number]  DOB: 06/02/1946  PCP: [name], MD    DOA: 05/18/2020 LOS: 2   Brief Narrative   The patient is a 74 yr old man who has known pancreatic neuroendocrine cancer. He sees oncologist Dr. Janese Banks. Dr. Janese Banks was referred this man to [facility] for surgery. He has been following up with GI there as well.  He has chronic abdominal pain due to this cancer, but he presented here with worsening abdominal pain.   Admitted to a med/surg bed for pain control.  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  There is also worsening of the biliary duct distension above the level of the stent since previous imaging from March 2021. Question of developing small bowel obstruction of ileus with increasing distention of the small bowel in the proximal small bowel with site of transition in the distal jejunum.  There now appears to be along with increasing in size of soft tissue mass a direct extension of stranding from the pancreas towards the left adrenal, although this could also be due to inflammation.  Oncology and GI consulted.  ERCP planned for today for stent placement/exchange.   Assessment & Plan   Principal Problem:   Acute cholangitis Active Problems:   Typical atrial flutter (HCC)   Abdominal pain   Transaminitis   Primary pancreatic neuroendocrine tumor   Sepsis ([place])   Bacteremia   Diabetes type 2, uncontrolled ([place])   Acute kidney injury superimposed on chronic kidney disease ([place])   Neoplasm related pain   Protein-calorie malnutrition, severe   Essential hypertension   Chronic obstructive pulmonary disease (HCC)   CAD (coronary artery disease)   Septic shock due to Acute Cholangitis and polymicrobial Bacteremia - POA with fever, hypotension, tachycardia, lactic acidosis leukocytosis jaundice - consistent with acute cholangitis.  Blood  cultures growing Klebsiella, enterobacteriaceae spp, E. Coli.   --ID is following --Continue meropenem --Follow cultures for sensitivities --IV fluids --monitor volume status (EF 50% on April 2021 Echo)  Ascending Cholangitis - POA, secondary to biliary obstruction with pancreatic NET.  GI is consulted, and plan is for ERCP today.  Antibiotics as above.  Monitor CMP daily.  Atrial flutter with RVR - 7/7 patient's rate control med held in AM due to soft BP's.  Went into a-fib/flutter with rates 120's to 140's on telemetry.  Cardiology was consulted stat due to urgent procedure planned.  Patient spontaneously converted to normal sinus with HR 70's-80's.  Cleared by cardiology for ERCP. Started on amiodarone.  Acute pancreatitis -in setting of pancreatic neuroendocrine tumor.  N.p.o. for now.  Partial small bowel obstruction -with possible transition point in the small bowel.  Hypoactive bowel sounds.  Has had BM since admission.  Monitor abdominal exam closely.  Elevated LFTs - due to above.  Monitor CMP.  Type 2 diabetes -Lantus 12 units daily, sliding scale NovoLog and CBGs  Anemia of chronic disease and iron deficiency -received Feraheme on 7/6.  Monitor CBC daily.  Monitor for signs of bleeding.  Transfuse if hemoglobin less than 7.0.  Hypothyroidism -continue levothyroxine  GERD -continue PPI  Hypotension -on midodrine outpatient, continued  COPD -continue home inhalers  Hyperlipidemia -continue statin  Vitamin B12 deficiency - continue supplement  Patient BMI: Body mass index is 22.06 kg/m.   DVT prophylaxis: on Eliquis, held for ERCP   Diet:  Diet Orders (From admission,  onward)    Start     Ordered   05/21/20 0001  Diet NPO time specified  Diet effective midnight        05/20/20 1848            Code Status: Full Code    Subjective 05/21/20    Patient seen bedside this morning.  He reports incessant belching and nausea but denies any vomiting.  Denies pain or  discomfort, fevers or chills.  Also denies any chest pain or palpitations.  He is anxious to get procedure done.  No acute events reported.   Disposition Plan & Communication   Status is: Inpatient  Remains inpatient appropriate because:Inpatient level of care appropriate due to severity of illness   Dispo: The patient is from: Home              Anticipated d/c is to: Home              Anticipated d/c date is: 3 days              Patient currently is not medically stable to d/c.   Family Communication: None at bedside during encounter, will attempt to call   Consults, Procedures, Significant Events   Consultants:   Gastroenterology  Oncology  Infectious disease  Nephrology  Cardiology  Pharmacy  Procedures:   ERCP planned for today  Antimicrobials:   Meropenem 7/6 >>    Objective   Vitals:   05/21/20 1228 05/21/20 1230 05/21/20 1326 05/21/20 1418  BP:    (!) 125/57  Pulse:    (!) 117  Resp: 17 18 20  (!) 24  Temp:    (!) 101.6 F (38.7 C)  TempSrc:    Oral  SpO2:    97%  Weight:      Height:        Intake/Output Summary (Last 24 hours) at 05/21/2020 1450 Last data filed at 05/21/2020 1205 Gross per 24 hour  Intake 366.23 ml  Output 400 ml  Net -33.77 ml   Filed Weights   05/18/20 1745 05/19/20 0206  Weight: 65.8 kg 63.9 kg    Physical Exam:  General exam: awake, alert, no acute distress HEENT: Icteric sclera, moist mucus membranes, hearing grossly normal  Respiratory system: CTAB but shallow inspirations, no wheezes, rales or rhonchi, normal respiratory effort. Cardiovascular system: normal S1/S2, irregularly irregular, no pedal edema.   Gastrointestinal system: soft, NT, ND, +bowel sounds. Central nervous system: A&O x4. no gross focal neurologic deficits, normal speech Extremities: moves all, no cyanosis, normal tone Skin: dry, intact, normal temperature, jaundice Psychiatry: normal mood, congruent affect, judgement and insight appear  normal  Labs   Data Reviewed: I have personally reviewed following labs and imaging studies  CBC: Recent Labs  Lab 05/18/20 1747 05/19/20 0533 05/20/20 0439 05/21/20 0741  WBC 8.8 13.6* 20.2* 25.8*  NEUTROABS  --   --   --  19.6*  HGB 11.9* 10.8* 10.0* 8.8*  HCT 35.1* 32.9* 30.5* 27.3*  MCV 90.2 90.9 91.0 92.9  PLT 168 106* 49* 52*   Basic Metabolic Panel: Recent Labs  Lab 05/18/20 1747 05/19/20 0533 05/20/20 0439 05/21/20 0741  NA 135 139 133* 133*  K 3.9 4.2 3.3* 4.7  CL 107 111 108 109  CO2 16* 20* 16* 18*  GLUCOSE 221* 154* 154* 169*  BUN 36* 38* 46* 63*  CREATININE 2.77* 2.73* 3.25* 3.86*  CALCIUM 9.1 8.6* 8.1* 7.6*   GFR: Estimated Creatinine Clearance: 14.9 mL/min (A) (  by C-G formula based on SCr of 3.86 mg/dL (H)). Liver Function Tests: Recent Labs  Lab 05/18/20 1747 05/19/20 0833 05/20/20 0439 05/21/20 0741  AST 87* 99* 94* 186*  ALT 149* 149* 130* 161*  ALKPHOS 844* 757* 591* 650*  BILITOT 3.5* 5.9* 6.1* 8.2*  PROT 6.5 6.0* 4.5* 4.5*  ALBUMIN 3.2* 2.8* 2.0* 2.0*   Recent Labs  Lab 05/18/20 1747 05/19/20 1502  LIPASE 100* 19   No results for input(s): AMMONIA in the last 168 hours. Coagulation Profile: No results for input(s): INR, PROTIME in the last 168 hours. Cardiac Enzymes: No results for input(s): CKTOTAL, CKMB, CKMBINDEX, TROPONINI in the last 168 hours. BNP (last 3 results) No results for input(s): PROBNP in the last 8760 hours. HbA1C: Recent Labs    05/19/20 0533  HGBA1C 8.2*   CBG: Recent Labs  Lab 05/20/20 1146 05/20/20 1715 05/20/20 2108 05/21/20 0751 05/21/20 1139  GLUCAP 147* 156* 103* 156* 128*   Lipid Profile: No results for input(s): CHOL, HDL, LDLCALC, TRIG, CHOLHDL, LDLDIRECT in the last 72 hours. Thyroid Function Tests: No results for input(s): TSH, T4TOTAL, FREET4, T3FREE, THYROIDAB in the last 72 hours. Anemia Panel: Recent Labs    05/19/20 0533  VITAMINB12 1,877*  FOLATE 10.5  TIBC 262  IRON 15*    Sepsis Labs: Recent Labs  Lab 05/19/20 0833 05/19/20 1134 05/19/20 1913 05/19/20 2139  LATICACIDVEN 2.2* 2.9* 2.1* 2.0*    Recent Results (from the past 240 hour(s))  SARS Coronavirus 2 by RT PCR (hospital order, performed in [facility] hospital lab) Nasopharyngeal Urine, Clean Catch     Status: None   Collection Time: 05/18/20 11:30 PM   Specimen: Urine, Clean Catch; Nasopharyngeal  Result Value Ref Range Status   SARS Coronavirus 2 NEGATIVE NEGATIVE Final    Comment: (NOTE) SARS-CoV-2 target nucleic acids are NOT DETECTED.  The SARS-CoV-2 RNA is generally detectable in upper and lower respiratory specimens during the acute phase of infection. The lowest concentration of SARS-CoV-2 viral copies this assay can detect is 250 copies / mL. A negative result does not preclude SARS-CoV-2 infection and should not be used as the sole basis for treatment or other patient management decisions.  A negative result may occur with improper specimen collection / handling, submission of specimen other than nasopharyngeal swab, presence of viral mutation(s) within the areas targeted by this assay, and inadequate number of viral copies (<250 copies / mL). A negative result must be combined with clinical observations, patient history, and epidemiological information.  Fact Sheet for Patients:   StrictlyIdeas.no  Fact Sheet for Healthcare Providers: BankingDealers.co.za  This test is not yet approved or  cleared by the [place] FDA and has been authorized for detection and/or diagnosis of SARS-CoV-2 by FDA under an Emergency Use Authorization (EUA).  This EUA will remain in effect (meaning this test can be used) for the duration of the COVID-19 declaration under Section 564(b)(1) of the Act, 21 U.S.C. section 360bbb-3(b)(1), unless the authorization is terminated or revoked sooner.  Performed at [facility], [address] [place], [place] [address]   CULTURE, BLOOD (ROUTINE X 2) w Reflex to ID Panel     Status: Abnormal (Preliminary result)   Collection Time: 05/19/20  3:02 PM   Specimen: BLOOD  Result Value Ref Range Status   Specimen Description   Final    BLOOD RIGHT ANTECUBITAL Performed at [facility], [address], [place],  [address]    Special Requests  Final    BOTTLES DRAWN AEROBIC AND ANAEROBIC Blood Culture adequate volume Performed at [facility], [address] [place], [place] [address]    Culture  Setup Time   Final    GRAM NEGATIVE RODS IN BOTH AEROBIC AND ANAEROBIC BOTTLES CRITICAL RESULT CALLED TO, READ BACK BY AND VERIFIED WITH: Hart Robinsons PHARMD [id number] 05/20/20 HNM    Culture (A)  Final    KLEBSIELLA PNEUMONIAE ESCHERICHIA COLI CULTURE REINCUBATED FOR BETTER GROWTH Performed at [facility], [place] [address] [place], [place] [address]    Report Status PENDING  Incomplete  Blood Culture ID Panel (Reflexed)     Status: Abnormal   Collection Time: 05/19/20  3:02 PM  Result Value Ref Range Status   Enterococcus species NOT DETECTED NOT DETECTED Final   Listeria monocytogenes NOT DETECTED NOT DETECTED Final   Staphylococcus species NOT DETECTED NOT DETECTED Final   Staphylococcus aureus (BCID) NOT DETECTED NOT DETECTED Final   Streptococcus species NOT DETECTED NOT DETECTED Final   Streptococcus agalactiae NOT DETECTED NOT DETECTED Final   Streptococcus pneumoniae NOT DETECTED NOT DETECTED Final   Streptococcus pyogenes NOT DETECTED NOT DETECTED Final   Acinetobacter baumannii NOT DETECTED NOT DETECTED Final   Enterobacteriaceae species DETECTED (A) NOT DETECTED Final    Comment: CRITICAL RESULT CALLED TO, READ BACK BY AND VERIFIED WITH:  SCOTT HALL PHARMD [id number] 05/20/20 HNM    Enterobacter cloacae complex NOT DETECTED NOT DETECTED Final   Escherichia coli DETECTED (A) NOT DETECTED Final    Comment: CRITICAL RESULT CALLED TO, READ BACK BY AND  VERIFIED WITH: SCOTT HALL PHARMD [id number] 05/20/20 HNM    Klebsiella oxytoca NOT DETECTED NOT DETECTED Final   Klebsiella pneumoniae DETECTED (A) NOT DETECTED Final    Comment: CRITICAL RESULT CALLED TO, READ BACK BY AND VERIFIED WITH: SCOTT HALL PHARMD [id number] 05/20/20 HNM    Proteus species NOT DETECTED NOT DETECTED Final   Serratia marcescens NOT DETECTED NOT DETECTED Final   Carbapenem resistance NOT DETECTED NOT DETECTED Final   Haemophilus influenzae NOT DETECTED NOT DETECTED Final   Neisseria meningitidis NOT DETECTED NOT DETECTED Final   Pseudomonas aeruginosa NOT DETECTED NOT DETECTED Final   Candida albicans NOT DETECTED NOT DETECTED Final   Candida glabrata NOT DETECTED NOT DETECTED Final   Candida krusei NOT DETECTED NOT DETECTED Final   Candida parapsilosis NOT DETECTED NOT DETECTED Final   Candida tropicalis NOT DETECTED NOT DETECTED Final    Comment: Performed at [facility], [address] [place], [place] [address]  CULTURE, BLOOD (ROUTINE X 2) w Reflex to ID Panel     Status: Abnormal (Preliminary result)   Collection Time: 05/19/20  3:14 PM   Specimen: BLOOD  Result Value Ref Range Status   Specimen Description   Final    BLOOD BLOOD RIGHT HAND Performed at [facility], [address] [place], [place] [address]    Special Requests   Final    BOTTLES DRAWN AEROBIC AND ANAEROBIC Blood Culture adequate volume Performed at [facility], [address] [place],  [address]    Culture  Setup Time   Final    GRAM NEGATIVE RODS IN BOTH AEROBIC AND ANAEROBIC BOTTLES CRITICAL VALUE NOTED.  VALUE IS CONSISTENT WITH PREVIOUSLY REPORTED AND CALLED VALUE. Performed at [facility], [address], [place],  [address]    Culture KLEBSIELLA PNEUMONIAE (A)  Final   Report Status PENDING  Incomplete  Imaging Studies   No results found.   Medications   Scheduled Meds: . acetaminophen  650 mg Rectal Once  . [MAR  Hold] amiodarone  200 mg Oral Daily  . [MAR Hold] feeding supplement  1 Container Oral TID BM  . [MAR Hold] insulin aspart  0-9 Units Subcutaneous TID WC  . [MAR Hold] insulin glargine  12 Units Subcutaneous QHS  . [MAR Hold] levothyroxine  25 mcg Oral q morning - 10a  . [MAR Hold] midodrine  10 mg Oral TID  . [MAR Hold] mometasone-formoterol  2 puff Inhalation BID  . [MAR Hold] pantoprazole  40 mg Oral Daily  . [MAR Hold] rosuvastatin  10 mg Oral Daily  . [MAR Hold] vitamin B-12  500 mcg Oral Daily   Continuous Infusions: . [MAR Hold] sodium chloride Stopped (05/20/20 0919)  . sodium chloride 125 mL/hr at 05/21/20 0354  . [MAR Hold] meropenem (MERREM) IV 1 g (05/21/20 0615)       LOS: 2 days    Time spent: 42 minutes with greater than 50% spent in coordination of care in direct patient contact.    Ezekiel Slocumb, DO [facility]  05/21/2020, 2:50 PM    If 7PM-7AM, please contact night-coverage. How to contact the [facility] Attending or Consulting provider [place] or covering provider during after hours [place], for this patient?    1. Check the care team in [facility] and look for a) attending/consulting [facility] provider listed and b) the [facility] team listed 2. Log into [email or web address] and use [place]'s universal password to access. If you do not have the password, please contact the hospital operator. 3. Locate the [facility] provider you are looking for under [facility] and page to a number that you can be directly reached. 4. If you still have difficulty reaching the provider, please page the [facility] (Director on Call) for the Hospitalists listed on amion for assistance.

## 2020-05-21 NOTE — Procedures (Signed)
Central Venous Catheter Insertion Procedure Note  [name]  [id number]  10-Feb-1946  Date:05/21/20  Time:9:37 PM    Provider Performing: Bradly Bienenstock    Procedure: Insertion of Arterial Line [phone]) with US guidance (11003)   Indication(s) Blood pressure monitoring and/or need for frequent ABGs  Consent Unable to obtain consent due to emergent nature of procedure.  Anesthesia None   Time Out Verified patient identification, verified procedure, site/side was marked, verified correct patient position, special equipment/implants available, medications/allergies/relevant history reviewed, required imaging and test results available.   Sterile Technique Maximal sterile technique including full sterile barrier drape, hand hygiene, sterile gown, sterile gloves, mask, hair covering, sterile ultrasound probe cover (if used).   Procedure Description Area of catheter insertion was cleaned with chlorhexidine and draped in sterile fashion. With real-time ultrasound guidance an arterial catheter was placed into the left femoral artery.  Appropriate arterial tracings confirmed on monitor.     Complications/Tolerance None; patient tolerated the procedure well.   EBL Minimal   Specimen(s) None   BIOPATCH applied to the insertion site.   Darel Hong, AGACNP-BC [facility] Pager: [phone]

## 2020-05-21 NOTE — Consult Note (Signed)
CARDIOLOGY CONSULT NOTE               [name] MRN: [id number] DOB/AGE: 1946-06-21 74 y.o.  Admit date: 05/18/2020 Referring Physician Dr. Malosi Hemstreet [facility]  Primary Physician Dr. [name]  Primary Cardiologist Dr. Murtis Sink  Reason for Consultation Atrial fibrillation with RVR, preoperative cardiac risk assessment   HPI: [name] is a 74 year old male with a past medical history significant for atrial flutter s/p DCCV x 3 and ablation in 2017/2018, anticoagulated with Eliquis, coronary artery disease detected by chest CT in 2020, HFpEF, oxygen dependent COPD, hypothyroidism, type 2 diabetes, hypertension, and recently diagnosed pancreatic cancer who presented to the ED on 05/18/20 for epigastric pain with associated nausea and vomiting. Abdominal CT revealed pancreatitis with a presumed pseudocyst with worsening of the biliary ductal dilatation.  Blood cultures were positive and he is scheduled to undergo ERCP due to cholangitis.  He currently denies any cardiac complaints including chest pain, chest pressure, shortness of breath, palpitations, heart racing, or lower extremity swelling.   He is followed by Dr. Murtis Sink at [facility].  Most recent echocardiogram on 02/29/20 revealed mild LV systolic dysfunction with an EF estimated at 49% with mild RV systolic dysfunction, mild PR - tachycardic (125bpm) during the duration of the study.   Review of systems complete and found to be negative unless listed above     Past Medical History:  Diagnosis Date  . Aortic atherosclerosis ([place])    As seen on 04/2019 CT  . Atrial flutter ([place])    a. s/p ablation 2017/2018 and DCCV 2015/2016/2019 b. 2017 echo EF 60-65%, no RWMA, mild MR,  . CAD (coronary artery disease)    3v CAD 04/2019 CT  . Carcinoid tumor    metastatic  . CKD (chronic kidney disease), stage III   . COPD (chronic obstructive pulmonary disease) ([facility])    PM oxygen  . Current occasional  smoker    1-2 cigarettes / week  . Diabetes mellitus type 2, insulin dependent ([place])   . Heart failure with preserved ejection fraction ([place])    2017 echo EF 60-65%, no RWMA, mild MR,    . Hyperlipidemia   . Hypertension   . Pancreatitis   . Polycythemia     Past Surgical History:  Procedure Laterality Date  . A-FLUTTER ABLATION N/A 10/14/2017   Procedure: A-FLUTTER ABLATION;  Surgeon: Constance Haw, MD;  Location: [facility];  Service: Cardiovascular;  Laterality: N/A;  . CARDIOVERSION N/A 09/08/2017   Procedure: CARDIOVERSION;  Surgeon: [name], MD;  Location: [facility];  Service: Cardiovascular;  Laterality: N/A;  . CARDIOVERSION N/A 03/22/2018   Procedure: CARDIOVERSION;  Surgeon: [name], MD;  Location: [facility];  Service: Cardiovascular;  Laterality: N/A;  . COLONOSCOPY    . COLONOSCOPY WITH PROPOFOL N/A 10/31/2019   Procedure: COLONOSCOPY WITH PROPOFOL;  Surgeon: Lin Landsman, MD;  Location: [facility];  Service: Gastroenterology;  Laterality: N/A;  . ELECTROPHYSIOLOGIC STUDY N/A 10/27/2016   Procedure: A-Flutter Ablation;  Surgeon: Deboraha Sprang, MD;  Location: [place] [facility];  Service: Cardiovascular;  Laterality: N/A;  . EUS N/A 12/22/2017   Procedure: FULL UPPER ENDOSCOPIC ULTRASOUND (EUS) RADIAL;  Surgeon: Reita Cliche, MD;  Location: [facility] ENDOSCOPY;  Service: Gastroenterology;  Laterality: N/A;  . TEE WITH CARDIOVERSION  12/16  . TEMPORARY DIALYSIS CATHETER N/A 02/08/2020   Procedure: TEMPORARY DIALYSIS CATHETER;  Surgeon: Algernon Huxley, MD;  Location: [place] [facility];  Service: Cardiovascular;  Laterality: N/A;    Medications Prior to Admission  Medication Sig Dispense Refill Last Dose  . acetaminophen (TYLENOL) 500 MG tablet Take 500-1,000 mg by mouth every 6 (six) hours as needed for moderate pain or headache.      . albuterol (PROAIR HFA) 108 (90 Base) MCG/ACT inhaler Inhale 2 puffs into the lungs every 4 (four)  hours as needed for wheezing or shortness of breath.      Marland Kitchen amiodarone (PACERONE) 200 MG tablet Take 1 tablet (200 mg total) by mouth daily. 90 tablet 3   . apixaban (ELIQUIS) 2.5 MG TABS tablet Take 1 tablet (2.5 mg total) by mouth 3 (three) times daily. 90 tablet 6   . budesonide-formoterol (SYMBICORT) 80-4.5 MCG/ACT inhaler Inhale 2 puffs into the lungs 2 (two) times daily. Takes at noon and at 4pm.     . EUTHYROX 25 MCG tablet Take 1 tablet (25 mcg total) by mouth every morning. 90 tablet 3   . Insulin Glargine (LANTUS SOLOSTAR) 100 UNIT/ML Solostar Pen Inject 5 Units into the skin at bedtime.      Marland Kitchen levothyroxine (SYNTHROID, LEVOTHROID) 50 MCG tablet Take 25 mcg by mouth daily.      . metoprolol succinate (TOPROL-XL) 50 MG 24 hr tablet TAKE 1 TABLET BY MOUTH IN THE EVENING. TAKE WITH OR IMMEDIATELY FOLLOWING A MEAL 90 tablet 0   . midodrine (PROAMATINE) 10 MG tablet Take 1 tablet (10 mg total) by mouth 3 (three) times daily. 90 tablet 6   . omeprazole (PRILOSEC) 20 MG capsule Take 1 capsule by mouth once daily 60 capsule 0   . rosuvastatin (CRESTOR) 40 MG tablet Take 1 tablet (40 mg total) by mouth daily. 90 tablet 3   . vitamin B-12 (CYANOCOBALAMIN) 500 MCG tablet Take 500 mcg by mouth daily.       Social History   Socioeconomic History  . Marital status: Widowed    Spouse name: Not on file  . Number of children: Not on file  . Years of education: Not on file  . Highest education level: Not on file  Occupational History  . Not on file  Tobacco Use  . Smoking status: Light Tobacco Smoker    Years: 45.00    Types: Cigarettes    Last attempt to quit: 11/27/2012    Years since quitting: 7.4  . Smokeless tobacco: Never Used  Vaping Use  . Vaping Use: Never used  Substance and Sexual Activity  . Alcohol use: Yes    Alcohol/week: 2.0 standard drinks    Types: 2 Cans of beer per week    Comment: beer every once a while once a monthnone last 24hrs  . Drug use: No  . Sexual activity:  Not on file  Other Topics Concern  . Not on file  Social History Narrative  . Not on file   Social Determinants of Health   Financial Resource Strain:   . Difficulty of Paying Living Expenses:   Food Insecurity:   . Worried About Charity fundraiser in the Last Year:   . Arboriculturist in the Last Year:   Transportation Needs:   . Film/video editor (Medical):   Marland Kitchen Lack of Transportation (Non-Medical):   Physical Activity:   . Days of Exercise per Week:   . Minutes of Exercise per Session:   Stress:   . Feeling of Stress :   Social Connections:   .  Frequency of Communication with Friends and Family:   . Frequency of Social Gatherings with Friends and Family:   . Attends Religious Services:   . Active Member of Clubs or Organizations:   . Attends Archivist Meetings:   Marland Kitchen Marital Status:   Intimate Partner Violence:   . Fear of Current or Ex-Partner:   . Emotionally Abused:   Marland Kitchen Physically Abused:   . Sexually Abused:     Family History  Problem Relation Age of Onset  . Hypertension Father       Review of systems complete and found to be negative unless listed above      PHYSICAL EXAM  General: Well developed, well nourished, in no acute distress HEENT:  Normocephalic and atramatic Neck:  No JVD.  Lungs: Clear bilaterally to auscultation and percussion. Heart: HRRR . Normal S1 and S2 without gallops or murmurs.  Abdomen: Bowel sounds are positive, abdomen soft and non-tender  Msk:  Back normal.  Normal strength and tone for age. Extremities: No clubbing, cyanosis or edema.   Neuro: Alert and oriented X 3. Psych:  Good affect, responds appropriately  Labs:   Lab Results  Component Value Date   WBC 25.8 (H) 05/21/2020   HGB 8.8 (L) 05/21/2020   HCT 27.3 (L) 05/21/2020   MCV 92.9 05/21/2020   PLT 52 (L) 05/21/2020    Recent Labs  Lab 05/21/20 0741  NA 133*  K 4.7  CL 109  CO2 18*  BUN 63*  CREATININE 3.86*  CALCIUM 7.6*  PROT 4.5*    BILITOT 8.2*  ALKPHOS 650*  ALT 161*  AST 186*  GLUCOSE 169*   Lab Results  Component Value Date   CKTOTAL 64 10/29/2014   CKMB 1.7 10/29/2014   TROPONINI <0.03 11/25/2018    Lab Results  Component Value Date   CHOL 79 11/26/2018   Lab Results  Component Value Date   HDL 32 (L) 11/26/2018   Lab Results  Component Value Date   LDLCALC 27 11/26/2018   Lab Results  Component Value Date   TRIG 98 11/26/2018   Lab Results  Component Value Date   CHOLHDL 2.5 11/26/2018   No results found for: LDLDIRECT    Radiology: CT ABDOMEN PELVIS WO CONTRAST  Result Date: 05/19/2020 CLINICAL DATA:  Abdominal pain with nausea in the setting of pancreatic neuroendocrine tumor EXAM: CT ABDOMEN AND PELVIS WITHOUT CONTRAST TECHNIQUE: Multidetector CT imaging of the abdomen and pelvis was performed following the standard protocol without IV contrast. COMPARISON:  February 08, 2020 FINDINGS: Lower chest: 4 mm LEFT lower lobe pulmonary nodule along the pleural surface not present on March of 2021. No consolidation. No pleural effusion. Hepatobiliary: Post placement of biliary stent. Despite presence of stent the common bile duct is more dilated than on the previous exam measuring up to 19 mm. Previously approximately 13 mm. Masslike areas in the pancreatic head are similar. There is been interval development of low attenuation in the pancreatic neck that was not present previously approximately 15 mm (image 28, series 2) Also further dilation of the distal pancreatic duct is suggested with increase in the soft tissue density in the mid body of the pancreas. This area measuring approximately 3.9 cm greatest thickness at the site of ductal transition where as on the prior study it measured approximately 3.5 cm. Extending from the upper margin of the dilated duct at the site of ductal transition is a low-attenuation ovoid area measuring water density at 5  by 3.1 cm, abutting the lesser curvature. (Image 19,  series 2) previously measuring approximately 3.9 x 3.0 cm in this location peripancreatic stranding is mildly increased when compared to the prior study. Soft tissue lesion associated with LEFT adrenal contiguous with with increase in size (image 21, series 2) 2.6 x 2.7 cm, previously 2.5 x 2.4 cm. Pancreas: As above Spleen: Spleen grossly normal on noncontrast imaging. Adrenals/Urinary Tract: Adrenal gland on the RIGHT is normal. LEFT adrenal mass contiguous with the pancreas enlarging as described. Mild perinephric stranding and in general anterior pararenal stranding on the LEFT. No hydronephrosis. Urinary bladder is unremarkable. Stomach/Bowel: Stomach with mild to moderate distension. Mild dilation of small bowel loops becomes moderately dilated in the mid small bowel at the level of the jejunum. There is peristaltic activity versus is transition in the distal jejunum at 3.6 cm caliber proximal to the site of narrowing with decompression of more distal small bowel loops. Colon is largely stool filled. Stool like material is present in the terminal ileum. Vascular/Lymphatic: Calcified atheromatous plaque of the abdominal aorta. No aneurysmal dilation. Adenopathy in the mesentery, largest measuring 11 mm is unchanged since the previous exam. Calcified mesenteric soft tissue (image 44, series 2) approximately 2.7 x 1.8 cm enlarged in short axis though difficult to measure given potential changes in mesenteric position over time. Reproductive: No adenopathy in the pelvis. Other: No free air.  No ascites. Musculoskeletal: No acute musculoskeletal process. Spinal degenerative changes. IMPRESSION: 1. 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 2. Worsening of biliary duct distension above the level of the stent since previous imaging from March of 2021 3. Increasing distension of small bowel in the proximal small bowel with  site of transition in the distal jejunum. Is unclear whether this represents vigorous peristaltic activity following contrast administration or developing small bowel obstruction or ileus. Subsequent follow-up based on symptoms with radiographs may be helpful. 4. Increasing soft tissue and increasing ductal dilation with at least 2 sites of transition in this patient with known neuroendocrine tumor suspicious for worsening tumor with enlarging adrenal involvement on the LEFT as discussed, at the site of previous more benign appearing LEFT adrenal neoplasm now inseparable from stranding and soft tissue in the region of the pancreatic tail. 5. Given appearance it is possible that direct extension of stranding from the pancreas towards the LEFT adrenal may relate to inflammation. Attention on follow-up. 6. Extensive portosystemic collaterals in the upper abdomen compatible with splenic venous compromise, not well assessed but this secondary finding is strongly suggestive of splenic venous compromise due to worsening tumor in the region of the pancreas. Electronically Signed   By: Zetta Bills M.D.   On: 05/19/2020 12:00    Telemetry:  Sinus rhythm, rate in the low 80s   ASSESSMENT AND PLAN:  1.  Atrial fibrillation with RVR  -Currently in sinus rhythm, rate in the 80s   -BP remains soft; would recommend remaining off of metoprolol and continuing amiodarone 200mg  daily   -Last dose of Eliquis was yesterday morning; resume following ERCP   2.  Preoperative cardiac risk assessment   -Optimized to proceed with ERCP from a cardiac standpoint; would recommend resuming Eliquis immediately following    The history, physical exam findings, and plan of care were all discussed with Dr. Bartholome Bill, and all decision making was made in collaboration.   Signed: Avie Arenas PA-C 05/21/2020, 12:54 PM

## 2020-05-21 NOTE — OR Nursing (Signed)
Patient febrile and is cardiac unstable.  Dr Kayleen Memos recommended patient be sent to CCU post procedure/recovery in pacu.   Stent removed. Two stents placed in duct draining large amounts of purulent fluid.

## 2020-05-22 ENCOUNTER — Inpatient Hospital Stay
Admit: 2020-05-22 | Discharge: 2020-05-22 | Disposition: A | Discharge status: Home / Self Care | Payer: Medicare Other | Attending: Pulmonary Disease | Admitting: Pulmonary Disease

## 2020-05-22 ENCOUNTER — Encounter: Payer: Self-pay | Admitting: Gastroenterology

## 2020-05-22 LAB — BLOOD GAS, ARTERIAL
Acid-base deficit: 12.9 mmol/L — ABNORMAL HIGH (ref 0.0–2.0)
Acid-base deficit: 8.5 mmol/L — ABNORMAL HIGH (ref 0.0–2.0)
Bicarbonate: 14 mmol/L — ABNORMAL LOW (ref 20.0–28.0)
Bicarbonate: 17.3 mmol/L — ABNORMAL LOW (ref 20.0–28.0)
FIO2: 0.4
FIO2: 0.35
MECHVT: 550 mL
MECHVT: 550 mL
Mechanical Rate: 18
O2 Saturation: 97.6 %
O2 Saturation: 98.4 %
PEEP: 5 cmH2O
PEEP: 5 cmH2O
Patient temperature: 37
Patient temperature: 37
RATE: 18 resp/min
pCO2 arterial: 35 mmHg (ref 32.0–48.0)
pCO2 arterial: 36 mmHg (ref 32.0–48.0)
pH, Arterial: 7.21 — ABNORMAL LOW (ref 7.350–7.450)
pH, Arterial: 7.29 — ABNORMAL LOW (ref 7.350–7.450)
pO2, Arterial: 117 mmHg — ABNORMAL HIGH (ref 83.0–108.0)
pO2, Arterial: 125 mmHg — ABNORMAL HIGH (ref 83.0–108.0)

## 2020-05-22 LAB — CBC WITH DIFFERENTIAL/PLATELET
Abs Immature Granulocytes: 1.11 10*3/uL — ABNORMAL HIGH (ref 0.00–0.07)
Basophils Absolute: 0.1 10*3/uL (ref 0.0–0.1)
Basophils Relative: 0 %
Eosinophils Absolute: 0 10*3/uL (ref 0.0–0.5)
Eosinophils Relative: 0 %
HCT: 25.9 % — ABNORMAL LOW (ref 39.0–52.0)
Hemoglobin: 8.5 g/dL — ABNORMAL LOW (ref 13.0–17.0)
Immature Granulocytes: 5 %
Lymphocytes Relative: 2 %
Lymphs Abs: 0.4 10*3/uL — ABNORMAL LOW (ref 0.7–4.0)
MCH: 30.1 pg (ref 26.0–34.0)
MCHC: 32.8 g/dL (ref 30.0–36.0)
MCV: 91.8 fL (ref 80.0–100.0)
Monocytes Absolute: 0.7 10*3/uL (ref 0.1–1.0)
Monocytes Relative: 3 %
Neutro Abs: 19.1 10*3/uL — ABNORMAL HIGH (ref 1.7–7.7)
Neutrophils Relative %: 90 %
Platelets: 66 10*3/uL — ABNORMAL LOW (ref 150–400)
RBC: 2.82 MIL/uL — ABNORMAL LOW (ref 4.22–5.81)
RDW: 14.3 % (ref 11.5–15.5)
Smear Review: NORMAL
WBC: 21.4 10*3/uL — ABNORMAL HIGH (ref 4.0–10.5)
nRBC: 0.3 % — ABNORMAL HIGH (ref 0.0–0.2)

## 2020-05-22 LAB — COMPREHENSIVE METABOLIC PANEL
ALT: 231 U/L — ABNORMAL HIGH (ref 0–44)
AST: 357 U/L — ABNORMAL HIGH (ref 15–41)
Albumin: 1.9 g/dL — ABNORMAL LOW (ref 3.5–5.0)
Alkaline Phosphatase: 798 U/L — ABNORMAL HIGH (ref 38–126)
Anion gap: 11 (ref 5–15)
BUN: 70 mg/dL — ABNORMAL HIGH (ref 8–23)
CO2: 20 mmol/L — ABNORMAL LOW (ref 22–32)
Calcium: 6.9 mg/dL — ABNORMAL LOW (ref 8.9–10.3)
Chloride: 106 mmol/L (ref 98–111)
Creatinine, Ser: 4 mg/dL — ABNORMAL HIGH (ref 0.61–1.24)
GFR calc Af Amer: 16 mL/min — ABNORMAL LOW (ref >60)
GFR calc non Af Amer: 14 mL/min — ABNORMAL LOW (ref >60)
Glucose, Bld: 157 mg/dL — ABNORMAL HIGH (ref 70–99)
Potassium: 4.8 mmol/L (ref 3.5–5.1)
Sodium: 137 mmol/L (ref 135–145)
Total Bilirubin: 9.1 mg/dL — ABNORMAL HIGH (ref 0.3–1.2)
Total Protein: 4.5 g/dL — ABNORMAL LOW (ref 6.5–8.1)

## 2020-05-22 LAB — CULTURE, BLOOD (ROUTINE X 2): Special Requests: ADEQUATE

## 2020-05-22 LAB — BASIC METABOLIC PANEL
Anion gap: 12 (ref 5–15)
BUN: 72 mg/dL — ABNORMAL HIGH (ref 8–23)
CO2: 18 mmol/L — ABNORMAL LOW (ref 22–32)
Calcium: 6.8 mg/dL — ABNORMAL LOW (ref 8.9–10.3)
Chloride: 104 mmol/L (ref 98–111)
Creatinine, Ser: 4.04 mg/dL — ABNORMAL HIGH (ref 0.61–1.24)
GFR calc Af Amer: 16 mL/min — ABNORMAL LOW (ref >60)
GFR calc non Af Amer: 14 mL/min — ABNORMAL LOW (ref >60)
Glucose, Bld: 159 mg/dL — ABNORMAL HIGH (ref 70–99)
Potassium: 4.6 mmol/L (ref 3.5–5.1)
Sodium: 134 mmol/L — ABNORMAL LOW (ref 135–145)

## 2020-05-22 LAB — GLUCOSE, CAPILLARY
Glucose-Capillary: 116 mg/dL — ABNORMAL HIGH (ref 70–99)
Glucose-Capillary: 133 mg/dL — ABNORMAL HIGH (ref 70–99)
Glucose-Capillary: 138 mg/dL — ABNORMAL HIGH (ref 70–99)
Glucose-Capillary: 146 mg/dL — ABNORMAL HIGH (ref 70–99)
Glucose-Capillary: 147 mg/dL — ABNORMAL HIGH (ref 70–99)
Glucose-Capillary: 150 mg/dL — ABNORMAL HIGH (ref 70–99)
Glucose-Capillary: 153 mg/dL — ABNORMAL HIGH (ref 70–99)

## 2020-05-22 LAB — MAGNESIUM: Magnesium: 1.6 mg/dL — ABNORMAL LOW (ref 1.7–2.4)

## 2020-05-22 LAB — CHROMOGRANIN A: Chromogranin A (ng/mL): 1707 ng/mL — ABNORMAL HIGH (ref 0.0–101.8)

## 2020-05-22 LAB — PROTIME-INR
INR: 1.4 — ABNORMAL HIGH (ref 0.8–1.2)
Prothrombin Time: 16.4 seconds — ABNORMAL HIGH (ref 11.4–15.2)

## 2020-05-22 LAB — ECHOCARDIOGRAM COMPLETE
Height: 67 in
Weight: 2253.98 oz

## 2020-05-22 LAB — PATHOLOGIST SMEAR REVIEW

## 2020-05-22 LAB — FIBRIN DERIVATIVES D-DIMER (ARMC ONLY): Fibrin derivatives D-dimer (ARMC): 3117.5 ng/mL (FEU) — ABNORMAL HIGH (ref 0.00–499.00)

## 2020-05-22 LAB — FIBRINOGEN: Fibrinogen: 513 mg/dL — ABNORMAL HIGH (ref 210–475)

## 2020-05-22 LAB — LIPASE, BLOOD: Lipase: 22 U/L (ref 11–51)

## 2020-05-22 LAB — PROCALCITONIN: Procalcitonin: 36.05 ng/mL

## 2020-05-22 MED ORDER — PHENYLEPHRINE CONCENTRATED 100MG/250ML (0.4 MG/ML) INFUSION SIMPLE
0.0000 ug/min | INTRAVENOUS | Status: DC
  Filled 2020-05-22: qty 250

## 2020-05-22 MED ORDER — SODIUM CHLORIDE 0.9 % IV BOLUS
1000.0000 mL | Freq: Once | INTRAVENOUS | Status: AC
  Administered 2020-05-22: 1000 mL via INTRAVENOUS

## 2020-05-22 MED ORDER — SODIUM BICARBONATE 8.4 % IV SOLN: 100.0000 meq | Freq: Once | INTRAVENOUS | Status: AC

## 2020-05-22 MED ORDER — SODIUM BICARBONATE 8.4 % IV SOLN
INTRAVENOUS | Status: AC
  Administered 2020-05-22: 100 meq via INTRAVENOUS
  Filled 2020-05-22: qty 100

## 2020-05-22 MED ORDER — CALCIUM GLUCONATE-NACL 1-0.675 GM/50ML-% IV SOLN
1.0000 g | Freq: Once | INTRAVENOUS | Status: AC
  Administered 2020-05-22: 1000 mg via INTRAVENOUS
  Filled 2020-05-22: qty 50

## 2020-05-22 MED ORDER — STERILE WATER FOR INJECTION IJ SOLN
INTRAMUSCULAR | Status: AC
  Administered 2020-05-22: 10 mL
  Filled 2020-05-22: qty 10

## 2020-05-22 MED ORDER — PANTOPRAZOLE SODIUM 40 MG IV SOLR
40.0000 mg | INTRAVENOUS | Status: DC
  Administered 2020-05-22 – 2020-05-26 (×5): 40 mg via INTRAVENOUS
  Filled 2020-05-22 (×5): qty 40

## 2020-05-22 MED ORDER — MAGNESIUM SULFATE IN D5W 1-5 GM/100ML-% IV SOLN
1.0000 g | Freq: Once | INTRAVENOUS | Status: AC
  Administered 2020-05-22: 1 g via INTRAVENOUS
  Filled 2020-05-22: qty 100

## 2020-05-22 NOTE — Plan of Care (Signed)
Pt night started very rough, unable to maintain BP goal. Pt already on levo gtt at 72mcq. Pt also on amio gtt for Afib/RVR . CVC line was placed and pt was started on quad strength levo gtt, verso gtt, bicarb gtt after I amp one bicarb was given, Pt not making any Urine, bladder was scan noted 35-6mls residual. CVP line was set up and reading 3-4, 2 Lt Normal saline bolus given and bladder re scan and pt has a residual 135-121mls.  Pt cont on full vent support 550/40/5/18, CVP now after bolus is 8-9, pt still on Levo gtt, vaso gtt and amio gtt. NS at 165mls and fentanyl at 441mcq. Pt is waking up this morning tracking staff in the room, seem more aware of his environments. Left groin art line with good wave form and collating with cuff pressure. Did see any family and no calls either.

## 2020-05-22 NOTE — Progress Notes (Signed)
No sedation vacation today due to patient being on 2 pressors- tried to wean off pressors twice today- patient was off both vaso and levo but soon patient's BP would drop to the 51'W systolic- at this time vaso is infusing and levo is infusing at 6 mcqs.  Coude foley was placed this evening- 2 of patient's nieces visited and plan is for comfort care at some point.

## 2020-05-22 NOTE — Consult Note (Signed)
CRITICAL CARE PROGRESS NOTE    Name: [name] MRN: [id number] DOB: Jul 29, 1946     LOS: 3   SUBJECTIVE FINDINGS & SIGNIFICANT EVENTS    Patient description:  [name] is a 74 year old male with a past medical history significant for atrial flutter s/p DCCV x 3 and ablation in 2017/2018, anticoagulated with Eliquis, coronary artery disease detected by chest CT in 2020, HFpEF, oxygen dependent COPD, hypothyroidism, type 2 diabetes, hypertension, and recently diagnosed pancreatic cancer who presented to the ED on 05/18/20 for epigastric pain with associated nausea and vomiting. Abdominal CT revealed pancreatitis with a presumed pseudocyst with worsening of the biliary ductal dilatation.  Blood cultures were positive for  polymicrobial bacteremia.  Blood cultures having both Klebsiella and E. 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  Course complicated by renal failure and initial dialysis need, GI bleed and pneumonia leading to septic shock.  Subsequently improved and off dialysis.  ERCP performed on 3/30 showed severe localized biliary stricture and underwent sphincterotomy with balloon sphincteroplasty.  He also had a biliary stent placement.  He also had a pancreatic duct stent placement he completed course of Zosyn post procedure.  The brushings were positive for tumor . Post ERCP patient was unable to be extubated and PCCM consult placed for further evaluation and mgt. On arrival patient is in [place].  He received esmolol prior to arrival without imrovement in HR which was >150. He then received 5lopressor and amiodarone gtt for rate control    05/22/20- patient remains on multiple vasopressors for septic and obstructive shock with multiorgan failure syndrome.   He is jaundiced with shock liver and anuric renal failure.  I have discussed his case with surgery team and at this time there are no surgical options for patient due to severe instability.   I met with 3 nieces today including POA.  We reviewed his medical history including lifelong smoking with advanced COPD FEV148% in 2017, complex cardiac hx with CHF and AFL s/p ablation x2, CKD3, PVD, neuroendocrine cancer well differentiated with metastassis and pancreatic cancer with biliary obstruction s/p ERCP with stenting and restestenting and now obstructive jaundice with shock.  They had numerous questions which we discussed.  POA states that son is not the decision maker for health care and they have advance directive in paper documentation. They wish to proceed with comfort measures due to grim prognosis long term.  POA has also asked to wait until tommorow so they can inform son and see if he wishes to come in person to see patient. We will accomodate family request.     Lines/tubes : Airway 7 mm (Active)  Secured at (cm) 22 cm 05/21/20 1645  Measured From Lips 05/21/20 [place] 05/21/20 1645  Secured By [employer] 05/21/20 1645  Cuff Pressure (cm H2O) 26 cm H2O 05/21/20 1645  Site Condition Cool;Dry 05/21/20 1645    Microbiology/Sepsis markers: Results for orders placed or performed during the hospital encounter of 05/18/20  SARS Coronavirus 2 by RT PCR (hospital order, performed in [facility] hospital lab) Nasopharyngeal Urine, Clean Catch     Status: None   Collection Time: 05/18/20 11:30 PM   Specimen: Urine, Clean Catch; Nasopharyngeal  Result Value Ref Range Status   SARS Coronavirus 2 NEGATIVE NEGATIVE Final    Comment: (NOTE) SARS-CoV-2 target nucleic acids are NOT DETECTED.  The SARS-CoV-2 RNA is generally detectable in upper and  lower respiratory specimens during the acute phase of infection. The lowest concentration of SARS-CoV-2 viral copies  this assay can detect is 250 copies / mL. A negative result does not preclude SARS-CoV-2 infection and should not be used as the sole basis for treatment or other patient management decisions.  A negative result may occur with improper specimen collection / handling, submission of specimen other than nasopharyngeal swab, presence of viral mutation(s) within the areas targeted by this assay, and inadequate number of viral copies (<250 copies / mL). A negative result must be combined with clinical observations, patient history, and epidemiological information.  Fact Sheet for Patients:   StrictlyIdeas.no  Fact Sheet for Healthcare Providers: BankingDealers.co.za  This test is not yet approved or  cleared by the [place] FDA and has been authorized for detection and/or diagnosis of SARS-CoV-2 by FDA under an Emergency Use Authorization (EUA).  This EUA will remain in effect (meaning this test can be used) for the duration of the COVID-19 declaration under Section 564(b)(1) of the Act, 21 U.S.C. section 360bbb-3(b)(1), unless the authorization is terminated or revoked sooner.  Performed at [facility], [address] [place], [place] [address]   CULTURE, BLOOD (ROUTINE X 2) w Reflex to ID Panel     Status: Abnormal (Preliminary result)   Collection Time: 05/19/20  3:02 PM   Specimen: BLOOD  Result Value Ref Range Status   Specimen Description   Final    BLOOD RIGHT ANTECUBITAL Performed at [facility], [address], [place], [place] [address]    Special Requests   Final    BOTTLES DRAWN AEROBIC AND ANAEROBIC Blood Culture adequate volume Performed at [facility], [address], [place], [place] [address]    Culture  Setup Time   Final    GRAM NEGATIVE RODS IN BOTH AEROBIC AND ANAEROBIC BOTTLES CRITICAL RESULT CALLED TO, READ BACK BY AND VERIFIED WITH: Hart Robinsons PHARMD [id number] 05/20/20 HNM     Culture (A)  Final    KLEBSIELLA PNEUMONIAE ESCHERICHIA COLI SUSCEPTIBILITIES TO FOLLOW Performed at [facility], [place] [address] [place], [place] [address]    Report Status PENDING  Incomplete  Blood Culture ID Panel (Reflexed)     Status: Abnormal   Collection Time: 05/19/20  3:02 PM  Result Value Ref Range Status   Enterococcus species NOT DETECTED NOT DETECTED Final   Listeria monocytogenes NOT DETECTED NOT DETECTED Final   Staphylococcus species NOT DETECTED NOT DETECTED Final   Staphylococcus aureus (BCID) NOT DETECTED NOT DETECTED Final   Streptococcus species NOT DETECTED NOT DETECTED Final   Streptococcus agalactiae NOT DETECTED NOT DETECTED Final   Streptococcus pneumoniae NOT DETECTED NOT DETECTED Final   Streptococcus pyogenes NOT DETECTED NOT DETECTED Final   Acinetobacter baumannii NOT DETECTED NOT DETECTED Final   Enterobacteriaceae species DETECTED (A) NOT DETECTED Final    Comment: CRITICAL RESULT CALLED TO, READ BACK BY AND VERIFIED WITH:  SCOTT HALL PHARMD [id number] 05/20/20 HNM    Enterobacter cloacae complex NOT DETECTED NOT DETECTED Final   Escherichia coli DETECTED (A) NOT DETECTED Final    Comment: CRITICAL RESULT CALLED TO, READ BACK BY AND VERIFIED WITH: SCOTT HALL PHARMD [id number] 05/20/20 HNM    Klebsiella oxytoca NOT DETECTED NOT DETECTED Final   Klebsiella pneumoniae DETECTED (A) NOT DETECTED Final    Comment: CRITICAL RESULT CALLED TO, READ BACK BY AND VERIFIED WITH: SCOTT HALL PHARMD 0335 05/20/20 HNM    Proteus species NOT DETECTED NOT  DETECTED Final   Serratia marcescens NOT DETECTED NOT DETECTED Final   Carbapenem resistance NOT DETECTED NOT DETECTED Final   Haemophilus influenzae NOT DETECTED NOT DETECTED Final   Neisseria meningitidis NOT DETECTED NOT DETECTED Final   Pseudomonas aeruginosa NOT DETECTED NOT DETECTED Final   Candida albicans NOT DETECTED NOT DETECTED Final   Candida glabrata NOT DETECTED NOT DETECTED Final   Candida krusei NOT  DETECTED NOT DETECTED Final   Candida parapsilosis NOT DETECTED NOT DETECTED Final   Candida tropicalis NOT DETECTED NOT DETECTED Final    Comment: Performed at [facility], [address] [place], [place] [address]  CULTURE, BLOOD (ROUTINE X 2) w Reflex to ID Panel     Status: Abnormal   Collection Time: 05/19/20  3:14 PM   Specimen: BLOOD  Result Value Ref Range Status   Specimen Description   Final    BLOOD BLOOD RIGHT HAND Performed at [facility], [address], [place], [place] [address]    Special Requests   Final    BOTTLES DRAWN AEROBIC AND ANAEROBIC Blood Culture adequate volume Performed at [facility], [address] [place], [place] [address]    Culture  Setup Time   Final    GRAM NEGATIVE RODS IN BOTH AEROBIC AND ANAEROBIC BOTTLES CRITICAL VALUE NOTED.  VALUE IS CONSISTENT WITH PREVIOUSLY REPORTED AND CALLED VALUE. Performed at [facility], [address] [place], [place] [address]    Culture KLEBSIELLA PNEUMONIAE (A)  Final   Report Status 05/22/2020 FINAL  Final   Organism ID, Bacteria KLEBSIELLA PNEUMONIAE  Final      Susceptibility   Klebsiella pneumoniae - MIC*    AMPICILLIN RESISTANT Resistant     CEFAZOLIN <=4 SENSITIVE Sensitive     CEFEPIME <=0.12 SENSITIVE Sensitive     CEFTAZIDIME <=1 SENSITIVE Sensitive     CEFTRIAXONE <=0.25 SENSITIVE Sensitive     CIPROFLOXACIN <=0.25 SENSITIVE Sensitive     GENTAMICIN <=1 SENSITIVE Sensitive     IMIPENEM <=0.25 SENSITIVE Sensitive     TRIMETH/SULFA <=20 SENSITIVE Sensitive     AMPICILLIN/SULBACTAM 4 SENSITIVE Sensitive     PIP/TAZO <=4 SENSITIVE Sensitive     * KLEBSIELLA PNEUMONIAE  MRSA PCR Screening     Status: None   Collection Time: 05/21/20  5:24 PM   Specimen: Nasopharyngeal  Result Value Ref Range Status   MRSA by PCR NEGATIVE NEGATIVE Final    Comment:        The GeneXpert MRSA Assay (FDA approved for NASAL specimens only), is one component of  a comprehensive MRSA colonization surveillance program. It is not intended to diagnose MRSA infection nor to guide or monitor treatment for MRSA infections. Performed at [facility], [address] [place], [place] [address]     Anti-infectives:  Anti-infectives (From admission, onward)   Start     Dose/Rate Route Frequency Ordered Stop   05/20/20 0600  meropenem (MERREM) 1 g in sodium chloride 0.9 % 100 mL IVPB     Discontinue     1 g 200 mL/hr over 30 Minutes Intravenous Every 12 hours 05/20/20 0344         Consults: Treatment Team:  [name], MD Teodoro Spray, MD Ottie Glazier, MD Murlean Iba, MD       PAST MEDICAL HISTORY   Past Medical History:  Diagnosis Date  . Aortic atherosclerosis ([place])    As seen on 04/2019 CT  . Atrial flutter ([place])  a. s/p ablation 2017/2018 and DCCV 2015/2016/2019 b. 2017 echo EF 60-65%, no RWMA, mild MR,  . CAD (coronary artery disease)    3v CAD 04/2019 CT  . Carcinoid tumor    metastatic  . CKD (chronic kidney disease), stage III   . COPD (chronic obstructive pulmonary disease) ([facility])    PM oxygen  . Current occasional smoker    1-2 cigarettes / week  . Diabetes mellitus type 2, insulin dependent ([place])   . Heart failure with preserved ejection fraction ([place])    2017 echo EF 60-65%, no RWMA, mild MR,    . Hyperlipidemia   . Hypertension   . Pancreatitis   . Polycythemia      SURGICAL HISTORY   Past Surgical History:  Procedure Laterality Date  . A-FLUTTER ABLATION N/A 10/14/2017   Procedure: A-FLUTTER ABLATION;  Surgeon: Constance Haw, MD;  Location: [facility];  Service: Cardiovascular;  Laterality: N/A;  . CARDIOVERSION N/A 09/08/2017   Procedure: CARDIOVERSION;  Surgeon: [name], MD;  Location: [facility];  Service: Cardiovascular;  Laterality: N/A;  . CARDIOVERSION N/A 03/22/2018   Procedure: CARDIOVERSION;  Surgeon: [name], MD;  Location: [facility] OR[facility];  Service:  Cardiovascular;  Laterality: N/A;  . COLONOSCOPY    . COLONOSCOPY WITH PROPOFOL N/A 10/31/2019   Procedure: COLONOSCOPY WITH PROPOFOL;  Surgeon: Lin Landsman, MD;  Location: [facility] ENDOSCOPY;  Service: Gastroenterology;  Laterality: N/A;  . ELECTROPHYSIOLOGIC STUDY N/A 10/27/2016   Procedure: A-Flutter Ablation;  Surgeon: Deboraha Sprang, MD;  Location: [facility];  Service: Cardiovascular;  Laterality: N/A;  . ENDOSCOPIC RETROGRADE CHOLANGIOPANCREATOGRAPHY (ERCP) WITH PROPOFOL N/A 05/21/2020   Procedure: ENDOSCOPIC RETROGRADE CHOLANGIOPANCREATOGRAPHY (ERCP) WITH PROPOFOL;  Surgeon: [name], MD;  Location: [facility] ENDOSCOPY;  Service: Endoscopy;  Laterality: N/A;  . EUS N/A 12/22/2017   Procedure: FULL UPPER ENDOSCOPIC ULTRASOUND (EUS) RADIAL;  Surgeon: Reita Cliche, MD;  Location: [facility] ENDOSCOPY;  Service: Gastroenterology;  Laterality: N/A;  . TEE WITH CARDIOVERSION  12/16  . TEMPORARY DIALYSIS CATHETER N/A 02/08/2020   Procedure: TEMPORARY DIALYSIS CATHETER;  Surgeon: Algernon Huxley, MD;  Location: [facility];  Service: Cardiovascular;  Laterality: N/A;     FAMILY HISTORY   Family History  Problem Relation Age of Onset  . Hypertension Father      SOCIAL HISTORY   Social History   Tobacco Use  . Smoking status: Light Tobacco Smoker    Years: 45.00    Types: Cigarettes    Last attempt to quit: 11/27/2012    Years since quitting: 7.4  . Smokeless tobacco: Never Used  Vaping Use  . Vaping Use: Never used  Substance Use Topics  . Alcohol use: Yes    Alcohol/week: 2.0 standard drinks    Types: 2 Cans of beer per week    Comment: beer every once a while once a monthnone last 24hrs  . Drug use: No     MEDICATIONS   Current Medication:  Current Facility-Administered Medications:  .  0.9 %  sodium chloride infusion, , Intravenous, PRN, Swayze, Ava, DO, Stopped at 05/20/20 0919 .  0.9 %  sodium chloride infusion, , Intravenous, Continuous, Swayze, Ava,  DO, Last Rate: 125 mL/hr at 05/22/20 0539, Restarted at 05/22/20 0539 .  0.9 %  sodium chloride infusion, 250 mL, Intravenous, Continuous, Kamaree Berkel, MD .  acetaminophen (TYLENOL) tablet 1,000 mg, 1,000 mg, Oral, Q6H PRN, Sharion Settler, NP, 1,000 mg at 05/20/20 0000 .  amiodarone (NEXTERONE PREMIX) 360-4.14 MG/200ML-% (1.8 mg/mL) IV infusion, 30 mg/hr, Intravenous, Continuous, Elijio Staples, MD, Last Rate: 16.67 mL/hr at 05/22/20 0600, 30 mg/hr at 05/22/20 0600 .  amiodarone (PACERONE) tablet 200 mg, 200 mg, Oral, Daily, Tu, Ching T, DO, 200 mg at 05/20/20 0825 .  calcium gluconate 1 g/ 50 mL sodium chloride IVPB, 1 g, Intravenous, Once, Micco Bourbeau, MD .  chlorhexidine gluconate (MEDLINE KIT) (PERIDEX) 0.12 % solution 15 mL, 15 mL, Mouth Rinse, BID, Lanney Gins, Loris Seelye, MD, 15 mL at 05/22/20 0817 .  Chlorhexidine Gluconate Cloth 2 % PADS 6 each, 6 each, Topical, Daily, Nicklas Mcsweeney, MD .  feeding supplement (BOOST / RESOURCE BREEZE) liquid 1 Container, 1 Container, Oral, TID BM, Swayze, Ava, DO, 1 Container at 05/20/20 2103 .  fentaNYL (SUBLIMAZE) bolus via infusion 50 mcg, 50 mcg, Intravenous, Q15 min PRN, Lanney Gins, Rynlee Lisbon, MD .  fentaNYL 252mg in NS 2561m(1019mml) infusion-PREMIX, 0-400 mcg/hr, Intravenous, Continuous, Tamorah Hada, MD, Last Rate: 40 mL/hr at 05/22/20 0638, 400 mcg/hr at 05/22/20 0630258 hydrocortisone sodium succinate (SOLU-CORTEF) 100 MG injection 50 mg, 50 mg, Intravenous, Q6H, KeeDarel Hong NP, 50 mg at 05/22/20 0817 .  insulin aspart (novoLOG) injection 0-9 Units, 0-9 Units, Subcutaneous, Q4H, KeeDarel Hong NP, 1 Units at 05/22/20 0827 .  insulin glargine (LANTUS) injection 12 Units, 12 Units, Subcutaneous, QHS, Tu, Ching T, DO, 12 Units at 05/21/20 2241 .  levothyroxine (SYNTHROID) tablet 25 mcg, 25 mcg, Oral, q morning - 10a, Tu, Ching T, DO, 25 mcg at 05/21/20 0615 .  MEDLINE mouth rinse, 15 mL, Mouth Rinse, 10 times per day, AleOttie GlazierMD, 15 mL at 05/22/20 0508 .  meropenem (MERREM) 1 g in sodium chloride 0.9 % 100 mL IVPB, 1 g, Intravenous, Q12H, HalHart Robinsons RPH, Stopped at 05/22/20 0539 .  midazolam (VERSED) injection 2 mg, 2 mg, Intravenous, Q15 min PRN, AleOttie GlazierD, 2 mg at 05/21/20 1734 .  midodrine (PROAMATINE) tablet 10 mg, 10 mg, Oral, TID, Tu, Ching T, DO, 10 mg at 05/21/20 1020 .  mometasone-formoterol (DULERA) 100-5 MCG/ACT inhaler 2 puff, 2 puff, Inhalation, BID, Tu, Ching T, DO, 2 puff at 05/21/20 0859 .  morphine 2 MG/ML injection 1 mg, 1 mg, Intravenous, Q2H PRN, Tu, Ching T, DO, 1 mg at 05/20/20 1613 .  norepinephrine (LEVOPHED) 16 mg in 250m96memix infusion, 0-40 mcg/min, Intravenous, Titrated, KeenDarel HongNP, Last Rate: 9.38 mL/hr at 05/22/20 0600, 10 mcg/min at 05/22/20 0600 .  norepinephrine (LEVOPHED) 4mg 28m250mL 4mix infusion, 2-10 mcg/min, Intravenous, Titrated, AleskeOttie GlazierStopped at 05/21/20 2219 .  ondansetron (ZOFRAN) injection 4 mg, 4 mg, Intravenous, Q6H PRN, Tu, Ching T, DO, 4 mg at 05/21/20 1252 .  pantoprazole (PROTONIX) EC tablet 40 mg, 40 mg, Oral, Daily, Tu, Ching T, DO, 40 mg at 05/20/20 0825 .  phenylephrine CONCENTRATED 100mg i55mdium chloride 0.9% 250mL (026m/mL) 38musion, 0-400 mcg/min, Intravenous, Titrated, Keion Neels, MD .  rosuvastatin (CRESTOR) tablet 10 mg, 10 mg, Oral, Daily, Swayze, Ava, DO, 10 mg at 05/20/20 1814 .  sodium bicarbonate 150 mEq in sterile water 1,000 mL infusion, , Intravenous, Continuous, Keene, JeDarel Hongast Rate: 125 mL/hr at 05/22/20 0639, New Bag at 05/22/20 0639 .  vasopressin (PITRESSIN) 20 Units in sodium chloride 0.9 % 100 mL infusion-*FOR SHOCK*, 0-0.03 Units/min, Intravenous, Continuous, Keene, JeDarel Hongast Rate: 9 mL/hr at 05/22/20 0600, 0.03 Units/min at 05/22/20 0600 .  vitamin B-12 (  CYANOCOBALAMIN) tablet 500 mcg, 500 mcg, Oral, Daily, Tu, Ching T, DO, 500 mcg at 05/20/20 0825    ALLERGIES    Levaquin [levofloxacin in d5w]    REVIEW OF SYSTEMS     Unable to obtain due to mechanical intubation   PHYSICAL EXAMINATION   Vital Signs: Temp:  [97.7 F (36.5 C)-101.6 F (38.7 C)] 98 F (36.7 C) (07/08 0800) Pulse Rate:  [49-132] 59 (07/08 0700) Resp:  [5-27] 5 (07/07 2215) BP: (59-132)/(43-67) 105/59 (07/08 0800) SpO2:  [93 %-100 %] 100 % (07/08 0800) Arterial Line BP: (74-150)/(29-75) 131/68 (07/08 0800) FiO2 (%):  [35 %-40 %] 35 % (07/08 0735)  GENERAL:chronically ill appearing on MV HEAD: Normocephalic, atraumatic.  EYES: Pupils equal, round, reactive to light.  No scleral icterus.  MOUTH: Moist mucosal membrane. NECK: Supple. No thyromegaly. No nodules. No JVD.  PULMONARY: MV sounds with mild rhonchi CARDIOVASCULAR: S1 and S2. Regular rate and rhythm. No murmurs, rubs, or gallops.  GASTROINTESTINAL: Soft, nontender, non-distended. No masses. Positive bowel sounds. No hepatosplenomegaly.  MUSCULOSKELETAL: No swelling, clubbing, or edema.  NEUROLOGIC: GCS4T SKIN:intact,warm,dry   PERTINENT DATA     Infusions: . sodium chloride Stopped (05/20/20 0919)  . sodium chloride 125 mL/hr at 05/22/20 0539  . sodium chloride    . amiodarone 30 mg/hr (05/22/20 0600)  . calcium gluconate    . fentaNYL infusion INTRAVENOUS 400 mcg/hr (05/22/20 7425)  . meropenem (MERREM) IV Stopped (05/22/20 0539)  . norepinephrine (LEVOPHED) Adult infusion 10 mcg/min (05/22/20 0600)  . norepinephrine (LEVOPHED) Adult infusion Stopped (05/21/20 2219)  . phenylephrine (NEO-SYNEPHRINE) Adult infusion    .  sodium bicarbonate (isotonic) infusion in sterile water 125 mL/hr at 05/22/20 0639  . vasopressin 0.03 Units/min (05/22/20 0600)   Scheduled Medications: . amiodarone  200 mg Oral Daily  . chlorhexidine gluconate (MEDLINE KIT)  15 mL Mouth Rinse BID  . Chlorhexidine Gluconate Cloth  6 each Topical Daily  . feeding supplement  1 Container Oral TID BM  . hydrocortisone sod  succinate (SOLU-CORTEF) inj  50 mg Intravenous Q6H  . insulin aspart  0-9 Units Subcutaneous Q4H  . insulin glargine  12 Units Subcutaneous QHS  . levothyroxine  25 mcg Oral q morning - 10a  . mouth rinse  15 mL Mouth Rinse 10 times per day  . midodrine  10 mg Oral TID  . mometasone-formoterol  2 puff Inhalation BID  . pantoprazole  40 mg Oral Daily  . rosuvastatin  10 mg Oral Daily  . vitamin B-12  500 mcg Oral Daily   PRN Medications: sodium chloride, acetaminophen, fentaNYL, midazolam, morphine injection, ondansetron (ZOFRAN) IV Hemodynamic parameters: CVP:  [3 mmHg-11 mmHg] 11 mmHg Intake/Output: 07/07 0701 - 07/08 0700 In: 7808.4 [I.V.:5548; IV Piggyback:2260.4] Out: 200 [Urine:200]  Ventilator  Settings: Vent Mode: PRVC FiO2 (%):  [35 %-40 %] 35 % Set Rate:  [16 bmp-18 bmp] 18 bmp Vt Set:  [55 mL-550 mL] 550 mL PEEP:  [5 cmH20] 5 cmH20    LAB RESULTS:  Basic Metabolic Panel: Recent Labs  Lab 05/19/20 0533 05/19/20 0533 05/20/20 0439 05/20/20 0439 05/21/20 0741 05/21/20 0741 05/21/20 1937 05/22/20 0526  NA 139  --  133*  --  133*  --  134* 137  K 4.2   < > 3.3*   < > 4.7   < > 4.8 4.8  CL 111  --  108  --  109  --  108 106  CO2 20*  --  16*  --  18*  --  15* 20*  GLUCOSE 154*  --  154*  --  169*  --  125* 157*  BUN 38*  --  46*  --  63*  --  67* 70*  CREATININE 2.73*  --  3.25*  --  3.86*  --  4.32* 4.00*  CALCIUM 8.6*  --  8.1*  --  7.6*  --  7.6* 6.9*  MG  --   --   --   --   --   --   --  1.6*   < > = values in this interval not displayed.   Liver Function Tests: Recent Labs  Lab 05/18/20 1747 05/19/20 0833 05/20/20 0439 05/21/20 0741 05/22/20 0526  AST 87* 99* 94* 186* 357*  ALT 149* 149* 130* 161* 231*  ALKPHOS 844* 757* 591* 650* 798*  BILITOT 3.5* 5.9* 6.1* 8.2* 9.1*  PROT 6.5 6.0* 4.5* 4.5* 4.5*  ALBUMIN 3.2* 2.8* 2.0* 2.0* 1.9*   Recent Labs  Lab 05/18/20 1747 05/19/20 1502  LIPASE 100* 19   No results for input(s): AMMONIA in the  last 168 hours. CBC: Recent Labs  Lab 05/19/20 0533 05/20/20 0439 05/21/20 0741 05/21/20 1850 05/22/20 0526  WBC 13.6* 20.2* 25.8* 46.9* 21.4*  NEUTROABS  --   --  19.6*  --  19.1*  HGB 10.8* 10.0* 8.8* 11.0* 8.5*  HCT 32.9* 30.5* 27.3* 33.4* 25.9*  MCV 90.9 91.0 92.9 91.3 91.8  PLT 106* 49* 52* 81* 66*   Cardiac Enzymes: No results for input(s): CKTOTAL, CKMB, CKMBINDEX, TROPONINI in the last 168 hours. BNP: Invalid input(s): POCBNP CBG: Recent Labs  Lab 05/21/20 1645 05/21/20 1942 05/21/20 2342 05/22/20 0358 05/22/20 0710  GLUCAP 116* 109* 148* 150* 138*       IMAGING RESULTS:  Imaging: DG Chest Port 1 View  Result Date: 05/21/2020 CLINICAL DATA:  Central line placement. EXAM: PORTABLE CHEST 1 VIEW COMPARISON:  Earlier this day. FINDINGS: Tip of the left internal jugular central venous catheter projects over the mid SVC. No pneumothorax. Endotracheal tube tip remains at the thoracic inlet. Normal heart size with unchanged mediastinal contours. Vascular congestion with chronic interstitial coarsening. Slight increase in left lung base atelectasis. No large pleural effusion. IMPRESSION: 1. Tip of the left internal jugular central venous catheter projects over the mid SVC. No pneumothorax. 2. Slight increase in left lung base atelectasis. Otherwise unchanged appearance of the chest. Electronically Signed   By: Keith Rake M.D.   On: 05/21/2020 20:53   DG Chest Port 1 View  Result Date: 05/21/2020 CLINICAL DATA:  Atrial fibrillation, pancreatitis, intubation EXAM: PORTABLE CHEST 1 VIEW COMPARISON:  Radiograph 02/08/2020 FINDINGS: Endotracheal tube 4.5 cm from the carina. Telemetry leads overlie the chest. Chronically coarsened interstitial opacities are present throughout the lungs, similar to the comparison. Hypoventilatory changes likely increased prominence of these findings. Mild central vascular congestion without convincing features of edema. No pneumothorax or  effusion. The aorta is calcified. The remaining cardiomediastinal contours are unremarkable. No acute osseous or soft tissue abnormality. Degenerative changes are present in the imaged spine and shoulders. IMPRESSION: 1. Endotracheal tube 4.5 cm from the carina. 2. Chronically coarsened interstitial opacities, similar to the comparison. 3. Mild central vascular congestion without convincing features of edema. 4. Atelectasis. Electronically Signed   By: Lovena Le M.D.   On: 05/21/2020 19:09   DG C-Arm 1-60 Min-No Report  Result Date: 05/21/2020 Fluoroscopy was utilized by the requesting physician.  No radiographic interpretation.   @PROBHOSP @  DG Chest Port 1 View  Result Date: 05/21/2020 CLINICAL DATA:  Central line placement. EXAM: PORTABLE CHEST 1 VIEW COMPARISON:  Earlier this day. FINDINGS: Tip of the left internal jugular central venous catheter projects over the mid SVC. No pneumothorax. Endotracheal tube tip remains at the thoracic inlet. Normal heart size with unchanged mediastinal contours. Vascular congestion with chronic interstitial coarsening. Slight increase in left lung base atelectasis. No large pleural effusion. IMPRESSION: 1. Tip of the left internal jugular central venous catheter projects over the mid SVC. No pneumothorax. 2. Slight increase in left lung base atelectasis. Otherwise unchanged appearance of the chest. Electronically Signed   By: Keith Rake M.D.   On: 05/21/2020 20:53   DG Chest Port 1 View  Result Date: 05/21/2020 CLINICAL DATA:  Atrial fibrillation, pancreatitis, intubation EXAM: PORTABLE CHEST 1 VIEW COMPARISON:  Radiograph 02/08/2020 FINDINGS: Endotracheal tube 4.5 cm from the carina. Telemetry leads overlie the chest. Chronically coarsened interstitial opacities are present throughout the lungs, similar to the comparison. Hypoventilatory changes likely increased prominence of these findings. Mild central vascular congestion without convincing features of  edema. No pneumothorax or effusion. The aorta is calcified. The remaining cardiomediastinal contours are unremarkable. No acute osseous or soft tissue abnormality. Degenerative changes are present in the imaged spine and shoulders. IMPRESSION: 1. Endotracheal tube 4.5 cm from the carina. 2. Chronically coarsened interstitial opacities, similar to the comparison. 3. Mild central vascular congestion without convincing features of edema. 4. Atelectasis. Electronically Signed   By: Lovena Le M.D.   On: 05/21/2020 19:09     ASSESSMENT AND PLAN    -Multidisciplinary rounds held today  Atypical flutter with rapid ventricular response -s/p amiodarone gtt due to refactory RVR post esmolol and metoprolol -cardiology is on case appreciate input    Cholangitis with obstuctive jaundice S/p ERCP with post operative failure to wean from MV -SBT when able  -light fentanyl gtt for goal RASS-1 ICU telemetry monitoring ID is on case - appreciate input - continue meropenem   Chronic cardiac comorbidities  atrial flutter s/p DCCV x 3 and ablation in 2017/2018 -on eliquis chronically - coronary artery disease  - HFpEF   COPD with chronic hypoxemia -hold DuoNEB while patient is in AFL -consider steroids since patient is hypotensive   Acute on chronic renal failure stage 3 - d/c non-essential nephrotoxic medications -nephrology consultation   Severe protein calorie malnutrition - bitemporal and peripheral muscle wasting -albumin <2 Dietary nutritional consultation   Septic shock   - due to bacteremia with klebsiella and Ecoli -present on admission  -use vasopressors to keep MAP>65 -follow ABG and LA -follow up cultures -c/w meropenem   Biliary obstruction with jaundice   -discussed with surgery team today - patient is unstable for any surgical intervention at this time and is not surgical candidate.   ID -continue IV abx as prescibed -follow up cultures  GI/Nutrition GI  PROPHYLAXIS as indicated DIET-->TF's as tolerated Constipation protocol as indicated  ENDO - ICU hypoglycemic\Hyperglycemia protocol -check FSBS per protocol   ELECTROLYTES -follow labs as needed -replace as needed -pharmacy consultation   DVT/GI PRX ordered -SCDs  TRANSFUSIONS AS NEEDED MONITOR FSBS ASSESS the need for LABS as needed   Critical care provider statement:    Critical care time (minutes):  109   Critical care time was exclusive of:  Separately billable procedures and treating other patients   Critical care was necessary to treat or prevent imminent or life-threatening deterioration of the following conditions:  septic  shock, cholangitis, AFL with RVR, multiple comorbid conditions   Critical care was time spent personally by me on the following activities:  Development of treatment plan with patient or surrogate, discussions with consultants, evaluation of patient's response to treatment, examination of patient, obtaining history from patient or surrogate, ordering and performing treatments and interventions, ordering and review of laboratory studies and re-evaluation of patient's condition.  I assumed direction of critical care for this patient from another provider in my specialty: no    This document was prepared using Dragon voice recognition software and may include unintentional dictation errors.    Ottie Glazier, M.D.  Division of [facility]

## 2020-05-22 NOTE — Consult Note (Signed)
PHARMACY CONSULT NOTE  Pharmacy Consult for Electrolyte Monitoring and Replacement   Recent Labs: Potassium (mmol/L)  Date Value  05/22/2020 4.8  11/01/2014 3.8   Magnesium (mg/dL)  Date Value  05/22/2020 1.6 (L)  10/30/2014 1.8   Calcium (mg/dL)  Date Value  05/22/2020 6.9 (L)   Calcium, Total (mg/dL)  Date Value  11/01/2014 9.4   Albumin (g/dL)  Date Value  05/22/2020 1.9 (L)  10/29/2014 3.0 (L)   Phosphorus (mg/dL)  Date Value  02/08/2020 6.9 (H)   Sodium (mmol/L)  Date Value  05/22/2020 137  10/11/2017 140  11/01/2014 138   Corrected Ca: 8.58 mg/dL  Assessment: 74 y.o. male with a history of CKD, COPD, atrial flutter s/p DCCV x 3 and ablation in 2017/2018, anticoagulated with Eliquis, CAD, diabetes, pancreatic cancer, recently diagnosed who presents with epigastric abdominal pain now with E coli and Klebsiella bacteremia.  MIVF: lactated ringers infusion at 125 mL/hr  Goal of Therapy:  Potassium 4.0 - 5.1 mmol/L Magnesium 2.0 - 2.4 mg/dL All Other Electrolytes WNL  Plan:   1 gram IV magnesium sulfate x 1  1 gram IV calcium gluconate per Dr Lanney Gins  100 mEq sodium bicarb administered this am  F/U electrolytes in am 7/9  Dallie Piles ,PharmD Clinical Pharmacist 05/22/2020 7:13 AM

## 2020-05-22 NOTE — Progress Notes (Signed)
Hematology/Oncology Consult note [facility]  [phone] [phone]  Patient Care Team: [name], MD as PCP - General (Cardiology) [name], MD as PCP - Cardiology (Cardiology) [name], MD as Consulting Physician (Cardiology) Clent Jacks, RN as Registered Nurse   Name of the patient: [name]  [id number]  09/01/1946   Date of visit: 05/22/2020  Interval history-patient is intubated and sedated   Review of systems- Review of Systems  Unable to perform ROS: Medical condition      Allergies  Allergen Reactions  . Levaquin [Levofloxacin In D5w] Swelling and Rash     Past Medical History:  Diagnosis Date  . Aortic atherosclerosis ([place])    As seen on 04/2019 CT  . Atrial flutter ([place])    a. s/p ablation 2017/2018 and DCCV 2015/2016/2019 b. 2017 echo EF 60-65%, no RWMA, mild MR,  . CAD (coronary artery disease)    3v CAD 04/2019 CT  . Carcinoid tumor    metastatic  . CKD (chronic kidney disease), stage III   . COPD (chronic obstructive pulmonary disease) ([facility])    PM oxygen  . Current occasional smoker    1-2 cigarettes / week  . Diabetes mellitus type 2, insulin dependent ([place])   . Heart failure with preserved ejection fraction ([place])    2017 echo EF 60-65%, no RWMA, mild MR,    . Hyperlipidemia   . Hypertension   . Pancreatitis   . Polycythemia      Past Surgical History:  Procedure Laterality Date  . A-FLUTTER ABLATION N/A 10/14/2017   Procedure: A-FLUTTER ABLATION;  Surgeon: Constance Haw, MD;  Location: [place] CV LAB;  Service: Cardiovascular;  Laterality: N/A;  . CARDIOVERSION N/A 09/08/2017   Procedure: CARDIOVERSION;  Surgeon: [name], MD;  Location: [facility] ORS;  Service: Cardiovascular;  Laterality: N/A;  . CARDIOVERSION N/A 03/22/2018   Procedure: CARDIOVERSION;  Surgeon: [name], MD;  Location: [facility] ORS;  Service: Cardiovascular;  Laterality: N/A;   . COLONOSCOPY    . COLONOSCOPY WITH PROPOFOL N/A 10/31/2019   Procedure: COLONOSCOPY WITH PROPOFOL;  Surgeon: Lin Landsman, MD;  Location[facility] ENDOSCOPY;  Service: Gastroenterology;  Laterality: N/A;  . ELECTROPHYSIOLOGIC STUDY N/A 10/27/2016   Procedure: A-Flutter Ablation;  Surgeon: Deboraha Sprang, MD;  Location: [place] CV LAB;  Service: Cardiovascular;  Laterality: N/A;  . ENDOSCOPIC RETROGRADE CHOLANGIOPANCREATOGRAPHY (ERCP) WITH PROPOFOL N/A 05/21/2020   Procedure: ENDOSCOPIC RETROGRADE CHOLANGIOPANCREATOGRAPHY (ERCP) WITH PROPOFOL;  Surgeon: [name], MD;  Location: [facility] ENDOSCOPY;  Service: Endoscopy;  Laterality: N/A;  . EUS N/A 12/22/2017   Procedure: FULL UPPER ENDOSCOPIC ULTRASOUND (EUS) RADIAL;  Surgeon: Reita Cliche, MD;  Location: [facility] ENDOSCOPY;  Service: Gastroenterology;  Laterality: N/A;  . TEE WITH CARDIOVERSION  12/16  . TEMPORARY DIALYSIS CATHETER N/A 02/08/2020   Procedure: TEMPORARY DIALYSIS CATHETER;  Surgeon: Algernon Huxley, MD;  Location: [place] CV LAB;  Service: Cardiovascular;  Laterality: N/A;    Social History   Socioeconomic History  . Marital status: Widowed    Spouse name: Not on file  . Number of children: Not on file  . Years of education: Not on file  . Highest education level: Not on file  Occupational History  . Not on file  Tobacco Use  . Smoking status: Light Tobacco Smoker    Years: 45.00    Types: Cigarettes    Last attempt to quit: 11/27/2012  Years since quitting: 7.4  . Smokeless tobacco: Never Used  Vaping Use  . Vaping Use: Never used  Substance and Sexual Activity  . Alcohol use: Yes    Alcohol/week: 2.0 standard drinks    Types: 2 Cans of beer per week    Comment: beer every once a while once a monthnone last 24hrs  . Drug use: No  . Sexual activity: Not on file  Other Topics Concern  . Not on file  Social History Narrative  . Not on file   Social Determinants of Health   Financial Resource Strain:    . Difficulty of Paying Living Expenses:   Food Insecurity:   . Worried About Charity fundraiser in the Last Year:   . Arboriculturist in the Last Year:   Transportation Needs:   . Film/video editor (Medical):   Marland Kitchen Lack of Transportation (Non-Medical):   Physical Activity:   . Days of Exercise per Week:   . Minutes of Exercise per Session:   Stress:   . Feeling of Stress :   Social Connections:   . Frequency of Communication with Friends and Family:   . Frequency of Social Gatherings with Friends and Family:   . Attends Religious Services:   . Active Member of Clubs or Organizations:   . Attends Archivist Meetings:   Marland Kitchen Marital Status:   Intimate Partner Violence:   . Fear of Current or Ex-Partner:   . Emotionally Abused:   Marland Kitchen Physically Abused:   . Sexually Abused:     Family History  Problem Relation Age of Onset  . Hypertension Father      Current Facility-Administered Medications:  .  0.9 %  sodium chloride infusion, , Intravenous, PRN, Swayze, Ava, DO, Stopped at 05/20/20 0919 .  0.9 %  sodium chloride infusion, , Intravenous, Continuous, Swayze, Ava, DO, Last Rate: 125 mL/hr at 05/22/20 0539, Restarted at 05/22/20 0539 .  0.9 %  sodium chloride infusion, 250 mL, Intravenous, Continuous, Aleskerov, Fuad, MD .  acetaminophen (TYLENOL) tablet 1,000 mg, 1,000 mg, Oral, Q6H PRN, Sharion Settler, NP, 1,000 mg at 05/20/20 0000 .  chlorhexidine gluconate (MEDLINE KIT) (PERIDEX) 0.12 % solution 15 mL, 15 mL, Mouth Rinse, BID, Lanney Gins, Fuad, MD, 15 mL at 05/22/20 0817 .  Chlorhexidine Gluconate Cloth 2 % PADS 6 each, 6 each, Topical, Daily, Aleskerov, Fuad, MD .  fentaNYL (SUBLIMAZE) bolus via infusion 50 mcg, 50 mcg, Intravenous, Q15 min PRN, Lanney Gins, Fuad, MD .  fentaNYL 2535mg in NS 2518m(1015mml) infusion-PREMIX, 0-400 mcg/hr, Intravenous, Continuous, Aleskerov, Fuad, MD, Last Rate: 35 mL/hr at 05/22/20 1350, 350 mcg/hr at 05/22/20 1350 .   hydrocortisone sodium succinate (SOLU-CORTEF) 100 MG injection 50 mg, 50 mg, Intravenous, Q6H, KeeDarel Hong NP, 50 mg at 05/22/20 0817 .  insulin aspart (novoLOG) injection 0-9 Units, 0-9 Units, Subcutaneous, Q4H, KeeDarel Hong NP, 2 Units at 05/22/20 1234 .  insulin glargine (LANTUS) injection 12 Units, 12 Units, Subcutaneous, QHS, Tu, Ching T, DO, 12 Units at 05/21/20 2241 .  levothyroxine (SYNTHROID) tablet 25 mcg, 25 mcg, Oral, q morning - 10a, Tu, Ching T, DO, 25 mcg at 05/21/20 0615 .  MEDLINE mouth rinse, 15 mL, Mouth Rinse, 10 times per day, AleOttie GlazierD, 15 mL at 05/22/20 1356 .  meropenem (MERREM) 1 g in sodium chloride 0.9 % 100 mL IVPB, 1 g, Intravenous, Q12H, HalHart Robinsons RPH, Stopped at 05/22/20 0539 .  midazolam (VERSED) injection  2 mg, 2 mg, Intravenous, Q15 min PRN, Ottie Glazier, MD, 2 mg at 05/22/20 1528 .  midodrine (PROAMATINE) tablet 10 mg, 10 mg, Oral, TID, Tu, Ching T, DO, 10 mg at 05/21/20 1020 .  mometasone-formoterol (DULERA) 100-5 MCG/ACT inhaler 2 puff, 2 puff, Inhalation, BID, Tu, Ching T, DO, 2 puff at 05/21/20 0859 .  morphine 2 MG/ML injection 1 mg, 1 mg, Intravenous, Q2H PRN, Tu, Ching T, DO, 1 mg at 05/20/20 1613 .  norepinephrine (LEVOPHED) 16 mg in 232m premix infusion, 0-40 mcg/min, Intravenous, Titrated, KDarel HongD, NP, Last Rate: 9.38 mL/hr at 05/22/20 0600, 10 mcg/min at 05/22/20 0600 .  ondansetron (ZOFRAN) injection 4 mg, 4 mg, Intravenous, Q6H PRN, Tu, Ching T, DO, 4 mg at 05/21/20 1252 .  pantoprazole (PROTONIX) injection 40 mg, 40 mg, Intravenous, Q24H, GDallie Piles RPH, 40 mg at 05/22/20 1234 .  phenylephrine CONCENTRATED 1016min sodium chloride 0.9% 25072m0.4mg43m) infusion, 0-400 mcg/min, Intravenous, Titrated, Aleskerov, Fuad, MD .  rosuvastatin (CRESTOR) tablet 10 mg, 10 mg, Oral, Daily, Swayze, Ava, DO, 10 mg at 05/20/20 1814 .  sodium bicarbonate 150 mEq in sterile water 1,000 mL infusion, , Intravenous,  Continuous, KeenDarel HongNP, Last Rate: 125 mL/hr at 05/22/20 1516, New Bag at 05/22/20 1516 .  vasopressin (PITRESSIN) 20 Units in sodium chloride 0.9 % 100 mL infusion-*FOR SHOCK*, 0-0.03 Units/min, Intravenous, Continuous, KeenDarel HongNP, Last Rate: 9 mL/hr at 05/22/20 0600, 0.03 Units/min at 05/22/20 0600 .  vitamin B-12 (CYANOCOBALAMIN) tablet 500 mcg, 500 mcg, Oral, Daily, Tu, Ching T, DO, 500 mcg at 05/22/20 1031  Physical exam:  Vitals:   05/22/20 1200 05/22/20 1300 05/22/20 1355 05/22/20 1400  BP: (!) 52/38 133/81  121/77  Pulse: (!) 48 95 98 88  Resp:      Temp: 97.7 F (36.5 C)     TempSrc: Axillary     SpO2: 100% 98% 99% 99%  Weight:      Height:       Patient intubated and sedated. No response to verbal commands  CMP Latest Ref Rng & Units 05/22/2020  Glucose 70 - 99 mg/dL 159(H)  BUN 8 - 23 mg/dL 72(H)  Creatinine 0.61 - 1.24 mg/dL 4.04(H)  Sodium 135 - 145 mmol/L 134(L)  Potassium 3.5 - 5.1 mmol/L 4.6  Chloride 98 - 111 mmol/L 104  CO2 22 - 32 mmol/L 18(L)  Calcium 8.9 - 10.3 mg/dL 6.8(L)  Total Protein 6.5 - 8.1 g/dL -  Total Bilirubin 0.3 - 1.2 mg/dL -  Alkaline Phos 38 - 126 U/L -  AST 15 - 41 U/L -  ALT 0 - 44 U/L -   CBC Latest Ref Rng & Units 05/22/2020  WBC 4.0 - 10.5 K/uL 21.4(H)  Hemoglobin 13.0 - 17.0 g/dL 8.5(L)  Hematocrit 39 - 52 % 25.9(L)  Platelets 150 - 400 K/uL 66(L)    @IMAGES @  CT ABDOMEN PELVIS WO CONTRAST  Result Date: 05/19/2020 CLINICAL DATA:  Abdominal pain with nausea in the setting of pancreatic neuroendocrine tumor EXAM: CT ABDOMEN AND PELVIS WITHOUT CONTRAST TECHNIQUE: Multidetector CT imaging of the abdomen and pelvis was performed following the standard protocol without IV contrast. COMPARISON:  February 08, 2020 FINDINGS: Lower chest: 4 mm LEFT lower lobe pulmonary nodule along the pleural surface not present on March of 2021. No consolidation. No pleural effusion. Hepatobiliary: Post placement of biliary stent. Despite  presence of stent the common bile duct is more dilated than on the previous  exam measuring up to 19 mm. Previously approximately 13 mm. Masslike areas in the pancreatic head are similar. There is been interval development of low attenuation in the pancreatic neck that was not present previously approximately 15 mm (image 28, series 2) Also further dilation of the distal pancreatic duct is suggested with increase in the soft tissue density in the mid body of the pancreas. This area measuring approximately 3.9 cm greatest thickness at the site of ductal transition where as on the prior study it measured approximately 3.5 cm. Extending from the upper margin of the dilated duct at the site of ductal transition is a low-attenuation ovoid area measuring water density at 5 by 3.1 cm, abutting the lesser curvature. (Image 19, series 2) previously measuring approximately 3.9 x 3.0 cm in this location peripancreatic stranding is mildly increased when compared to the prior study. Soft tissue lesion associated with LEFT adrenal contiguous with with increase in size (image 21, series 2) 2.6 x 2.7 cm, previously 2.5 x 2.4 cm. Pancreas: As above Spleen: Spleen grossly normal on noncontrast imaging. Adrenals/Urinary Tract: Adrenal gland on the RIGHT is normal. LEFT adrenal mass contiguous with the pancreas enlarging as described. Mild perinephric stranding and in general anterior pararenal stranding on the LEFT. No hydronephrosis. Urinary bladder is unremarkable. Stomach/Bowel: Stomach with mild to moderate distension. Mild dilation of small bowel loops becomes moderately dilated in the mid small bowel at the level of the jejunum. There is peristaltic activity versus is transition in the distal jejunum at 3.6 cm caliber proximal to the site of narrowing with decompression of more distal small bowel loops. Colon is largely stool filled. Stool like material is present in the terminal ileum. Vascular/Lymphatic: Calcified atheromatous  plaque of the abdominal aorta. No aneurysmal dilation. Adenopathy in the mesentery, largest measuring 11 mm is unchanged since the previous exam. Calcified mesenteric soft tissue (image 44, series 2) approximately 2.7 x 1.8 cm enlarged in short axis though difficult to measure given potential changes in mesenteric position over time. Reproductive: No adenopathy in the pelvis. Other: No free air.  No ascites. Musculoskeletal: No acute musculoskeletal process. Spinal degenerative changes. IMPRESSION: 1. 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 2. Worsening of biliary duct distension above the level of the stent since previous imaging from March of 2021 3. Increasing distension of small bowel in the proximal small bowel with site of transition in the distal jejunum. Is unclear whether this represents vigorous peristaltic activity following contrast administration or developing small bowel obstruction or ileus. Subsequent follow-up based on symptoms with radiographs may be helpful. 4. Increasing soft tissue and increasing ductal dilation with at least 2 sites of transition in this patient with known neuroendocrine tumor suspicious for worsening tumor with enlarging adrenal involvement on the LEFT as discussed, at the site of previous more benign appearing LEFT adrenal neoplasm now inseparable from stranding and soft tissue in the region of the pancreatic tail. 5. Given appearance it is possible that direct extension of stranding from the pancreas towards the LEFT adrenal may relate to inflammation. Attention on follow-up. 6. Extensive portosystemic collaterals in the upper abdomen compatible with splenic venous compromise, not well assessed but this secondary finding is strongly suggestive of splenic venous compromise due to worsening tumor in the region of the pancreas. Electronically Signed   By: Zetta Bills M.D.   On:  05/19/2020 12:00   DG Chest [facility]  Result Date: 05/21/2020 CLINICAL DATA:  Central line placement. EXAM: PORTABLE CHEST 1 VIEW COMPARISON:  Earlier this day. FINDINGS: Tip of the left internal jugular central venous catheter projects over the mid SVC. No pneumothorax. Endotracheal tube tip remains at the thoracic inlet. Normal heart size with unchanged mediastinal contours. Vascular congestion with chronic interstitial coarsening. Slight increase in left lung base atelectasis. No large pleural effusion. IMPRESSION: 1. Tip of the left internal jugular central venous catheter projects over the mid SVC. No pneumothorax. 2. Slight increase in left lung base atelectasis. Otherwise unchanged appearance of the chest. Electronically Signed   By: Keith Rake M.D.   On: 05/21/2020 20:53   DG Chest Port 1 View  Result Date: 05/21/2020 CLINICAL DATA:  Atrial fibrillation, pancreatitis, intubation EXAM: PORTABLE CHEST 1 VIEW COMPARISON:  Radiograph 02/08/2020 FINDINGS: Endotracheal tube 4.5 cm from the carina. Telemetry leads overlie the chest. Chronically coarsened interstitial opacities are present throughout the lungs, similar to the comparison. Hypoventilatory changes likely increased prominence of these findings. Mild central vascular congestion without convincing features of edema. No pneumothorax or effusion. The aorta is calcified. The remaining cardiomediastinal contours are unremarkable. No acute osseous or soft tissue abnormality. Degenerative changes are present in the imaged spine and shoulders. IMPRESSION: 1. Endotracheal tube 4.5 cm from the carina. 2. Chronically coarsened interstitial opacities, similar to the comparison. 3. Mild central vascular congestion without convincing features of edema. 4. Atelectasis. Electronically Signed   By: Lovena Le M.D.   On: 05/21/2020 19:09   DG C-Arm 1-60 Min-No Report  Result Date: 05/21/2020 Fluoroscopy was utilized by the requesting physician.  No  radiographic interpretation.     Assessment and plan- Patient is a 74 y.o. 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  There was a visibly occluded stent in the common bile duct which was taken out.  Biliary stricture in the lower third of the main bile duct was seen.  There was pus found in the biliary tree.  Plastic stent placed into the common bile duct.  Following that patient was transferred to the ICU for A. fib with RVR and inability to get extubated.  He has multiorgan failure with worsening AKI. Nephrology on board. 2 out of 2 blood cultures growing gram-negative rods.  He is on meropenem  He is on 2 pressors and overall prognosis appears poor.  Patient was admitted for acute liver failure and acute kidney injury back in March 2021 as well and required temporary dialysis but recovered from that incident.  ICU team had a discussion with patient's nieces.  They wish to proceed with comfort care measures but plan is to wait until tomorrow so patient's son can be informed  Thrombocytopenia- likely secondary to acute bone marrow suppression from acute cholangitis  Leucocytosis: improving   Visit Diagnosis 1. Epigastric pain   2. Malignant neoplasm of pancreas, unspecified location of malignancy ([place])   3. Primary pancreatic neuroendocrine tumor   4. Abnormal CXR   5. Encounter for central line placement      Dr. Randa Evens, MD, MPH [facility] at [facility] [phone] 05/22/2020 4:58 PM

## 2020-05-22 NOTE — Progress Notes (Signed)
*  PRELIMINARY RESULTS* Echocardiogram 2D Echocardiogram has been performed.  [name] 05/22/2020, 12:21 PM

## 2020-05-22 NOTE — Progress Notes (Signed)
ID Pt in ICU on pressors S/p ERCP and stent remwith new stents placement E.coli and klebsiella bacteremia due ot biliary obsytuction and ascending cholangitis On Meropenem Discussed with his nurse

## 2020-05-22 NOTE — Progress Notes (Signed)
Attempted to place coude catheter per Dr. Teodoro Kil order with Dr. Lanney Gins at bedside. Patient given versed prior to attempt to help relax patient. Met resistance and was unable to advance even with coude catheter. When catheter retracted blood clot (thin, roughly 3 cm long) at end of catheter. No further attempts. Condom catheter replaced. Patient already has urology consult pending.

## 2020-05-22 NOTE — Progress Notes (Addendum)
Attempted placing foley unable to advance the foley catheter  Another RN  Attempted same result NP notified. bladder scan early showed 42ms. CVP set up, cerebrated and zero, result CVP 3-4, NP notified pt getting fluid bolus now. NP attempting to place Foley catheter and met same resistance.NP stated will consult Nephrology in am, will  bladder scan Pt again after bolus fluid is finished.

## 2020-05-22 NOTE — Progress Notes (Signed)
Carlos Lame, MD [facility]   [address], [place], [place] [address] Phone: [phone] Fax : [phone]   Subjective: The patient had an uneventful ERCP yesterday with removal of the stent that was clogged and placement of 2 stents for better drainage.  There was a significant amount of pus coming out of the biliary tree.  The patient has a significantly lower white cell count today but his liver enzymes have increased.  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  The patient was extubated and then had to be reintubated and is now on pressors. Preprocedure yesterday the patient's health status was complicated by tachycardia from rapid ventricular rate from her fibrillation and hypotension.  The patient's abnormalities improved and returned to normal after changing the stent and he was sent to the ICU.   Objective: Vital signs in last 24 hours: Vitals:   05/22/20 1400 05/22/20 1500 05/22/20 1600 05/22/20 1700  BP: 121/77 114/75 (!) 89/68 97/71  Pulse: 88 63 (!) 59 84  Resp:      Temp:      TempSrc:      SpO2: 99% 99% 100% 100%  Weight:      Height:       Weight change:   Intake/Output Summary (Last 24 hours) at 05/22/2020 1753 Last data filed at 05/22/2020 1407 Gross per 24 hour  Intake 7944.66 ml  Output 0 ml  Net 7944.66 ml     Exam: Heart:: Regular rate and rhythm, S1S2 present or without murmur or extra heart sounds Lungs: normal and clear to auscultation and percussion Abdomen: soft, nontender, normal bowel sounds   Lab Results: @LABTEST2 @ Micro Results: Recent Results (from the past 240 hour(s))  SARS Coronavirus 2 by RT PCR (hospital order, performed in [facility] hospital lab) Nasopharyngeal Urine, Clean Catch     Status: None   Collection Time: 05/18/20 11:30 PM   Specimen: Urine, Clean Catch; Nasopharyngeal  Result Value Ref Range Status   SARS Coronavirus 2 NEGATIVE NEGATIVE Final    Comment:  (NOTE) SARS-CoV-2 target nucleic acids are NOT DETECTED.  The SARS-CoV-2 RNA is generally detectable in upper and lower respiratory specimens during the acute phase of infection. The lowest concentration of SARS-CoV-2 viral copies this assay can detect is 250 copies / mL. A negative result does not preclude SARS-CoV-2 infection and should not be used as the sole basis for treatment or other patient management decisions.  A negative result may occur with improper specimen collection / handling, submission of specimen other than nasopharyngeal swab, presence of viral mutation(s) within the areas targeted by this assay, and inadequate number of viral copies (<250 copies / mL). A negative result must be combined with clinical observations, patient history, and epidemiological information.  Fact Sheet for Patients:   StrictlyIdeas.no  Fact Sheet for Healthcare Providers: BankingDealers.co.za  This test is not yet approved or  cleared by the [place] FDA and has been authorized for detection and/or diagnosis of SARS-CoV-2 by FDA under an Emergency Use Authorization (EUA).  This EUA will remain in effect (meaning this test can be used) for the duration of the COVID-19 declaration under Section 564(b)(1) of the Act, 21 U.S.C. section 360bbb-3(b)(1), unless the authorization is terminated or revoked sooner.  Performed at [facility], [place], [place] [address]   CULTURE, BLOOD (ROUTINE X 2) w Reflex to ID Panel     Status: Abnormal (Preliminary result)  Collection Time: 05/19/20  3:02 PM   Specimen: BLOOD  Result Value Ref Range Status   Specimen Description   Final    BLOOD RIGHT ANTECUBITAL Performed at [facility], [address], [place], [place] [address]    Special Requests   Final    BOTTLES DRAWN AEROBIC AND ANAEROBIC Blood Culture adequate volume Performed at [facility], [address] [place], [place] [address]    Culture  Setup Time   Final    GRAM NEGATIVE RODS IN BOTH AEROBIC AND ANAEROBIC BOTTLES CRITICAL RESULT CALLED TO, READ BACK BY AND VERIFIED WITH: Hart Robinsons PHARMD [id number] 05/20/20 HNM    Culture (A)  Final    KLEBSIELLA PNEUMONIAE ESCHERICHIA COLI SUSCEPTIBILITIES TO FOLLOW Performed at [facility], [place] [address] [place], [place] [address]    Report Status PENDING  Incomplete  Blood Culture ID Panel (Reflexed)     Status: Abnormal   Collection Time: 05/19/20  3:02 PM  Result Value Ref Range Status   Enterococcus species NOT DETECTED NOT DETECTED Final   Listeria monocytogenes NOT DETECTED NOT DETECTED Final   Staphylococcus species NOT DETECTED NOT DETECTED Final   Staphylococcus aureus (BCID) NOT DETECTED NOT DETECTED Final   Streptococcus species NOT DETECTED NOT DETECTED Final   Streptococcus agalactiae NOT DETECTED NOT DETECTED Final   Streptococcus pneumoniae NOT DETECTED NOT DETECTED Final   Streptococcus pyogenes NOT DETECTED NOT DETECTED Final   Acinetobacter baumannii NOT DETECTED NOT DETECTED Final   Enterobacteriaceae species DETECTED (A) NOT DETECTED Final    Comment: CRITICAL RESULT CALLED TO, READ BACK BY AND VERIFIED WITH:  SCOTT HALL PHARMD [id number] 05/20/20 HNM    Enterobacter cloacae complex NOT DETECTED NOT DETECTED Final   Escherichia coli DETECTED (A) NOT DETECTED Final    Comment: CRITICAL RESULT CALLED TO, READ BACK BY AND VERIFIED WITH: SCOTT HALL PHARMD [id number] 05/20/20 HNM    Klebsiella oxytoca NOT DETECTED NOT DETECTED Final   Klebsiella pneumoniae DETECTED (A) NOT DETECTED Final    Comment: CRITICAL RESULT CALLED TO, READ BACK BY AND VERIFIED WITH: SCOTT HALL PHARMD [id number] 05/20/20 HNM    Proteus species NOT DETECTED NOT DETECTED Final   Serratia marcescens NOT DETECTED NOT DETECTED Final   Carbapenem resistance NOT DETECTED NOT DETECTED Final   Haemophilus influenzae NOT DETECTED NOT DETECTED Final   Neisseria  meningitidis NOT DETECTED NOT DETECTED Final   Pseudomonas aeruginosa NOT DETECTED NOT DETECTED Final   Candida albicans NOT DETECTED NOT DETECTED Final   Candida glabrata NOT DETECTED NOT DETECTED Final   Candida krusei NOT DETECTED NOT DETECTED Final   Candida parapsilosis NOT DETECTED NOT DETECTED Final   Candida tropicalis NOT DETECTED NOT DETECTED Final    Comment: Performed at [facility], [address] [place], [place] [address]  CULTURE, BLOOD (ROUTINE X 2) w Reflex to ID Panel     Status: Abnormal   Collection Time: 05/19/20  3:14 PM   Specimen: BLOOD  Result Value Ref Range Status   Specimen Description   Final    BLOOD BLOOD RIGHT HAND Performed at [facility], [address], [place], [place] [address]    Special Requests   Final    BOTTLES DRAWN AEROBIC AND ANAEROBIC Blood Culture adequate volume Performed at [facility], [address] [place], [place] [address]    Culture  Setup Time   Final    GRAM NEGATIVE RODS IN BOTH AEROBIC AND ANAEROBIC BOTTLES CRITICAL  VALUE NOTED.  VALUE IS CONSISTENT WITH PREVIOUSLY REPORTED AND CALLED VALUE. Performed at [facility], [address] [place],  [address]    Culture KLEBSIELLA PNEUMONIAE (A)  Final   Report Status 05/22/2020 FINAL  Final   Organism ID, Bacteria KLEBSIELLA PNEUMONIAE  Final      Susceptibility   Klebsiella pneumoniae - MIC*    AMPICILLIN RESISTANT Resistant     CEFAZOLIN <=4 SENSITIVE Sensitive     CEFEPIME <=0.12 SENSITIVE Sensitive     CEFTAZIDIME <=1 SENSITIVE Sensitive     CEFTRIAXONE <=0.25 SENSITIVE Sensitive     CIPROFLOXACIN <=0.25 SENSITIVE Sensitive     GENTAMICIN <=1 SENSITIVE Sensitive     IMIPENEM <=0.25 SENSITIVE Sensitive     TRIMETH/SULFA <=20 SENSITIVE Sensitive     AMPICILLIN/SULBACTAM 4 SENSITIVE Sensitive     PIP/TAZO <=4 SENSITIVE Sensitive     * KLEBSIELLA PNEUMONIAE  MRSA PCR Screening     Status: None   Collection Time:  05/21/20  5:24 PM   Specimen: Nasopharyngeal  Result Value Ref Range Status   MRSA by PCR NEGATIVE NEGATIVE Final    Comment:        The GeneXpert MRSA Assay (FDA approved for NASAL specimens only), is one component of a comprehensive MRSA colonization surveillance program. It is not intended to diagnose MRSA infection nor to guide or monitor treatment for MRSA infections. Performed at [facility], [address], [place],  [address]    Studies/Results: [facility] Chest Port 1 View  Result Date: 05/21/2020 CLINICAL DATA:  Central line placement. EXAM: PORTABLE CHEST 1 VIEW COMPARISON:  Earlier this day. FINDINGS: Tip of the left internal jugular central venous catheter projects over the mid SVC. No pneumothorax. Endotracheal tube tip remains at the thoracic inlet. Normal heart size with unchanged mediastinal contours. Vascular congestion with chronic interstitial coarsening. Slight increase in left lung base atelectasis. No large pleural effusion. IMPRESSION: 1. Tip of the left internal jugular central venous catheter projects over the mid SVC. No pneumothorax. 2. Slight increase in left lung base atelectasis. Otherwise unchanged appearance of the chest. Electronically Signed   By: Keith Rake M.D.   On: 05/21/2020 20:53   DG Chest Port 1 View  Result Date: 05/21/2020 CLINICAL DATA:  Atrial fibrillation, pancreatitis, intubation EXAM: PORTABLE CHEST 1 VIEW COMPARISON:  Radiograph 02/08/2020 FINDINGS: Endotracheal tube 4.5 cm from the carina. Telemetry leads overlie the chest. Chronically coarsened interstitial opacities are present throughout the lungs, similar to the comparison. Hypoventilatory changes likely increased prominence of these findings. Mild central vascular congestion without convincing features of edema. No pneumothorax or effusion. The aorta is calcified. The remaining cardiomediastinal contours are unremarkable. No acute osseous or soft tissue abnormality.  Degenerative changes are present in the imaged spine and shoulders. IMPRESSION: 1. Endotracheal tube 4.5 cm from the carina. 2. Chronically coarsened interstitial opacities, similar to the comparison. 3. Mild central vascular congestion without convincing features of edema. 4. Atelectasis. Electronically Signed   By: Lovena Le M.D.   On: 05/21/2020 19:09   DG C-Arm 1-60 Min-No Report  Result Date: 05/21/2020 Fluoroscopy was utilized by the requesting physician.  No radiographic interpretation.   ECHOCARDIOGRAM COMPLETE  Result Date: 05/22/2020    ECHOCARDIOGRAM REPORT   Patient Name:   [name] [facility] Date of Exam: 05/22/2020 Medical Rec #:  [id number]             Height:       67.0 in Accession #:  [id number]            Weight:       140.9 lb Date of Birth:  Feb 02, 1946             BSA:          1.742 m Patient Age:    74 years              BP:           97/68 mmHg Patient Gender: M                     HR:           62 bpm. Exam Location:  [facility] Procedure: 2D Echo, Cardiac Doppler and Color Doppler Indications:     Shock  History:         Patient has prior history of Echocardiogram examinations, most                  recent 09/23/2016. COPD, Arrythmias:Atrial Flutter; Risk                  Factors:Hypertension, Diabetes and Dyslipidemia.  Sonographer:     Sherrie Sport RDCS (AE) Referring Phys:  [id number] Bradly Bienenstock Diagnosing Phys: Serafina Royals MD  Sonographer Comments: No parasternal window, no apical window and echo performed with patient supine and on artificial respirator. The subcostal was the only view obtainable. and Image acquisition challenging due to COPD. IMPRESSIONS  1. Left ventricular ejection fraction, by estimation, is 35 to 40%. The left ventricle has moderately decreased function. The left ventricle demonstrates regional wall motion abnormalities (see scoring diagram/findings for description). Left ventricular  diastolic parameters were normal.  2. Right ventricular systolic  function is moderately reduced. The right ventricular size is moderately enlarged. Mildly increased right ventricular wall thickness. There is severely elevated pulmonary artery systolic pressure.  3. Left atrial size was mild to moderately dilated.  4. Right atrial size was mild to moderately dilated.  5. The mitral valve is normal in structure. Mild to moderate mitral valve regurgitation.  6. Tricuspid valve regurgitation is moderate.  7. The aortic valve is normal in structure. Aortic valve regurgitation is trivial. FINDINGS  Left Ventricle: Left ventricular ejection fraction, by estimation, is 35 to 40%. The left ventricle has moderately decreased function. The left ventricle demonstrates regional wall motion abnormalities. Moderate hypokinesis of the left ventricular, entire inferoseptal wall. The left ventricular internal cavity size was normal in size. There is no left ventricular hypertrophy. Left ventricular diastolic parameters were normal. Right Ventricle: The right ventricular size is moderately enlarged. Mildly increased right ventricular wall thickness. Right ventricular systolic function is moderately reduced. There is severely elevated pulmonary artery systolic pressure. The tricuspid  regurgitant velocity is 3.80 m/s, and with an assumed right atrial pressure of 10 mmHg, the estimated right ventricular systolic pressure is 04.8 mmHg. Left Atrium: Left atrial size was mild to moderately dilated. Right Atrium: Right atrial size was mild to moderately dilated. Pericardium: There is no evidence of pericardial effusion. Mitral Valve: The mitral valve is normal in structure. Mild to moderate mitral valve regurgitation. Tricuspid Valve: The tricuspid valve is normal in structure. Tricuspid valve regurgitation is moderate. Aortic Valve: The aortic valve is normal in structure. Aortic valve regurgitation is trivial. Pulmonic Valve: The pulmonic valve was normal in structure. Pulmonic valve regurgitation is  mild. Aorta: The aortic root and ascending aorta are structurally normal, with no evidence of dilitation. IAS/Shunts:  No atrial level shunt detected by color flow Doppler.  LEFT VENTRICLE PLAX 2D LVIDd:         4.23 cm LVIDs:         3.60 cm LV PW:         1.36 cm LV IVS:        0.91 cm  LEFT ATRIUM         Index LA diam:    5.10 cm 2.93 cm/m  PULMONIC VALVE PV Vmax:        0.83 m/s PV Peak grad:   2.7 mmHg RVOT Peak grad: 2 mmHg  TRICUSPID VALVE TR Peak grad:   57.8 mmHg TR Vmax:        380.00 cm/s Serafina Royals MD Electronically signed by Serafina Royals MD Signature Date/Time: 05/22/2020/4:43:08 PM    Final    Medications: I have reviewed the patient's current medications. Scheduled Meds: . chlorhexidine gluconate (MEDLINE KIT)  15 mL Mouth Rinse BID  . Chlorhexidine Gluconate Cloth  6 each Topical Daily  . hydrocortisone sod succinate (SOLU-CORTEF) inj  50 mg Intravenous Q6H  . insulin aspart  0-9 Units Subcutaneous Q4H  . insulin glargine  12 Units Subcutaneous QHS  . levothyroxine  25 mcg Oral q morning - 10a  . mouth rinse  15 mL Mouth Rinse 10 times per day  . midodrine  10 mg Oral TID  . mometasone-formoterol  2 puff Inhalation BID  . pantoprazole (PROTONIX) IV  40 mg Intravenous Q24H  . rosuvastatin  10 mg Oral Daily  . vitamin B-12  500 mcg Oral Daily   Continuous Infusions: . sodium chloride Stopped (05/20/20 0919)  . sodium chloride 125 mL/hr at 05/22/20 0539  . sodium chloride    . fentaNYL infusion INTRAVENOUS 350 mcg/hr (05/22/20 1350)  . meropenem (MERREM) IV 1 g (05/22/20 1659)  . norepinephrine (LEVOPHED) Adult infusion 10 mcg/min (05/22/20 0600)  . phenylephrine (NEO-SYNEPHRINE) Adult infusion    .  sodium bicarbonate (isotonic) infusion in sterile water 125 mL/hr at 05/22/20 1516  . vasopressin 0.03 Units/min (05/22/20 0600)   PRN Meds:.sodium chloride, acetaminophen, fentaNYL, midazolam, morphine injection, ondansetron (ZOFRAN) IV   Assessment: Principal  Problem:   Acute cholangitis Active Problems:   Typical atrial flutter (HCC)   Essential hypertension   Chronic obstructive pulmonary disease (HCC)   Diabetes type 2, uncontrolled (HCC)   CAD (coronary artery disease)   Abdominal pain   Acute kidney injury superimposed on chronic kidney disease (HCC)   Transaminitis   Neoplasm related pain   Primary pancreatic neuroendocrine tumor   Protein-calorie malnutrition, severe   Sepsis ([place])   Bacteremia   Malignant neoplasm of pancreas ([place])    Plan: This patient had an ERCP status post bacteremia with sepsis and hypotension with tachycardia.  The patient continues to be in the ICU intubated.  I have discussed his care with the intensivist and it appears that the patient is having multiple medical issues including issues with his kidneys as well as pulmonary issues and cardiac issues.  I recommend continued supportive care at this time.  I will continue to follow with you.   LOS: 3 days   [name] 05/22/2020, 5:53 PM Pager [phone] 7am-5pm  Check AMION for 5pm -7am coverage and on weekends

## 2020-05-22 NOTE — Consult Note (Signed)
[address] [place],  [address] Phone [phone]. Fax [phone]  Date: 05/22/2020                  Patient Name:  [name]  MRN: [id number]  DOB: 10-19-1946  Age / Sex: 74 y.o., male         PCP: [name], MD                 Service Requesting Consult: IM/ Ottie Glazier, MD                 Reason for Consult: ARF            History of Present Illness: Patient is a 74 y.o. male with medical problems of Pancreatic neuroendocrine tumor, chronic pancreatitis, atrial flutter status post ablation, requiring anticoagulation with Eliquis, hypertension, coronary disease, COPD, insulin-dependent type 2 diabetes, CKD stage IIIb, who was admitted to [facility] on 05/18/2020 for evaluation of Epigastric pain [R10.13] Abdominal pain [R10.9] Malignant neoplasm of pancreas, unspecified location of malignancy ([place]) [C25.9]  Patient has baseline chronic diffuse abdominal pain from his pancreatic cancer but came in for acute worsening with nausea and vomiting. Nephrology consult has been requested for evaluation of acute renal failure Patient is currently intubated and on multiple pressors.  He is not able to provide any meaningful information.  All information is obtained from the chart and primary team.  Currently ventilator dependent with FiO2 35% Requiring vasopressin and epinephrine Also getting sodium bicarbonate infusion, fentanyl and amiodarone infusion   Medications: Outpatient medications: Medications Prior to Admission  Medication Sig Dispense Refill Last Dose  . acetaminophen (TYLENOL) 500 MG tablet Take 500-1,000 mg by mouth every 6 (six) hours as needed for moderate pain or headache.      . albuterol (PROAIR HFA) 108 (90 Base) MCG/ACT inhaler Inhale 2 puffs into the lungs every 4 (four) hours as needed for wheezing or shortness of breath.      Marland Kitchen amiodarone (PACERONE) 200 MG tablet Take 1 tablet (200 mg total) by mouth daily. 90 tablet 3   .  apixaban (ELIQUIS) 2.5 MG TABS tablet Take 1 tablet (2.5 mg total) by mouth 3 (three) times daily. 90 tablet 6   . budesonide-formoterol (SYMBICORT) 80-4.5 MCG/ACT inhaler Inhale 2 puffs into the lungs 2 (two) times daily. Takes at noon and at 4pm.     . EUTHYROX 25 MCG tablet Take 1 tablet (25 mcg total) by mouth every morning. 90 tablet 3   . Insulin Glargine (LANTUS SOLOSTAR) 100 UNIT/ML Solostar Pen Inject 5 Units into the skin at bedtime.      Marland Kitchen levothyroxine (SYNTHROID, LEVOTHROID) 50 MCG tablet Take 25 mcg by mouth daily.      . metoprolol succinate (TOPROL-XL) 50 MG 24 hr tablet TAKE 1 TABLET BY MOUTH IN THE EVENING. TAKE WITH OR IMMEDIATELY FOLLOWING A MEAL 90 tablet 0   . midodrine (PROAMATINE) 10 MG tablet Take 1 tablet (10 mg total) by mouth 3 (three) times daily. 90 tablet 6   . omeprazole (PRILOSEC) 20 MG capsule Take 1 capsule by mouth once daily 60 capsule 0   . rosuvastatin (CRESTOR) 40 MG tablet Take 1 tablet (40 mg total) by mouth daily. 90 tablet 3   . vitamin B-12 (CYANOCOBALAMIN) 500 MCG tablet Take 500 mcg by mouth daily.        Current medications: Current Facility-Administered Medications  Medication Dose Route Frequency Provider Last Rate Last Admin  . 0.9 %  sodium chloride infusion   Intravenous PRN Swayze, Ava, DO   Stopped at 05/20/20 0919  . 0.9 %  sodium chloride infusion   Intravenous Continuous Swayze, Ava, DO 125 mL/hr at 05/22/20 0539 Restarted at 05/22/20 0539  . 0.9 %  sodium chloride infusion  250 mL Intravenous Continuous Ottie Glazier, MD      . acetaminophen (TYLENOL) tablet 1,000 mg  1,000 mg Oral Q6H PRN Sharion Settler, NP   1,000 mg at 05/20/20 0000  . amiodarone (NEXTERONE PREMIX) 360-4.14 MG/200ML-% (1.8 mg/mL) IV infusion  30 mg/hr Intravenous Continuous Aleskerov, Fuad, MD 16.67 mL/hr at 05/22/20 0600 30 mg/hr at 05/22/20 0600  . chlorhexidine gluconate (MEDLINE KIT) (PERIDEX) 0.12 % solution 15 mL  15 mL Mouth Rinse BID Ottie Glazier, MD    15 mL at 05/22/20 0817  . Chlorhexidine Gluconate Cloth 2 % PADS 6 each  6 each Topical Daily Ottie Glazier, MD      . fentaNYL (SUBLIMAZE) bolus via infusion 50 mcg  50 mcg Intravenous Q15 min PRN Ottie Glazier, MD      . fentaNYL 2538mg in NS 2536m(1022mml) infusion-PREMIX  0-400 mcg/hr Intravenous Continuous AleOttie GlazierD 40 mL/hr at 05/22/20 0638 400 mcg/hr at 05/22/20 [id number] hydrocortisone sodium succinate (SOLU-CORTEF) 100 MG injection 50 mg  50 mg Intravenous Q6H KeeDarel Hong NP   50 mg at 05/22/20 0817  . insulin aspart (novoLOG) injection 0-9 Units  0-9 Units Subcutaneous Q4H KeeDarel Hong NP   1 Units at 05/22/20 0827  . insulin glargine (LANTUS) injection 12 Units  12 Units Subcutaneous QHS Tu, Ching T, DO   12 Units at 05/21/20 2241  . levothyroxine (SYNTHROID) tablet 25 mcg  25 mcg Oral q morning - 10a Tu, Ching T, DO   25 mcg at 05/21/20 0615  . MEDLINE mouth rinse  15 mL Mouth Rinse 10 times per day AleOttie GlazierD   15 mL at 05/22/20 1031  . meropenem (MERREM) 1 g in sodium chloride 0.9 % 100 mL IVPB  1 g Intravenous Q12H HalHart Robinsons RPH   Stopped at 05/22/20 0539  . midazolam (VERSED) injection 2 mg  2 mg Intravenous Q15 min PRN AleOttie GlazierD   2 mg at 05/22/20 1138  . midodrine (PROAMATINE) tablet 10 mg  10 mg Oral TID Tu, Ching T, DO   10 mg at 05/21/20 1020  . mometasone-formoterol (DULERA) 100-5 MCG/ACT inhaler 2 puff  2 puff Inhalation BID Tu, Ching T, DO   2 puff at 05/21/20 0859  . morphine 2 MG/ML injection 1 mg  1 mg Intravenous Q2H PRN Tu, Ching T, DO   1 mg at 05/20/20 1613  . norepinephrine (LEVOPHED) 16 mg in 250m2memix infusion  0-40 mcg/min Intravenous Titrated KeenDarel HongNP 9.38 mL/hr at 05/22/20 0600 10 mcg/min at 05/22/20 0600  . norepinephrine (LEVOPHED) 4mg 12m250mL 57mix infusion  2-10 mcg/min Intravenous Titrated AleskeOttie Glazier Stopped at 05/21/20 2219  . ondansetron (ZOFRAN) injection 4 mg  4 mg  Intravenous Q6H PRN Tu, Ching T, DO   4 mg at 05/21/20 1252  . pantoprazole (PROTONIX) injection 40 mg  40 mg Intravenous Q24H Grubb,Vallery SaH      . phenylephrine CONCENTRATED 100mg i83mdium chloride 0.9% 250mL (031m/mL) 85musion  0-400 mcg/min Intravenous Titrated Aleskerov, Fuad, MD      . rosuvastatin (CRESTOR) tablet 10 mg  10 mg Oral Daily Swayze, Ava, DO  10 mg at 05/20/20 1814  . sodium bicarbonate 150 mEq in sterile water 1,000 mL infusion   Intravenous Continuous Bradly Bienenstock, NP 125 mL/hr at 05/22/20 0639 New Bag at 05/22/20 2440  . vasopressin (PITRESSIN) 20 Units in sodium chloride 0.9 % 100 mL infusion-*FOR SHOCK*  0-0.03 Units/min Intravenous Continuous Darel Hong D, NP 9 mL/hr at 05/22/20 0600 0.03 Units/min at 05/22/20 0600  . vitamin B-12 (CYANOCOBALAMIN) tablet 500 mcg  500 mcg Oral Daily Tu, Ching T, DO   500 mcg at 05/22/20 1031      Allergies: Allergies  Allergen Reactions  . Levaquin [Levofloxacin In D5w] Swelling and Rash      Past Medical History: Past Medical History:  Diagnosis Date  . Aortic atherosclerosis ([place])    As seen on 04/2019 CT  . Atrial flutter ([place])    a. s/p ablation 2017/2018 and DCCV 2015/2016/2019 b. 2017 echo EF 60-65%, no RWMA, mild MR,  . CAD (coronary artery disease)    3v CAD 04/2019 CT  . Carcinoid tumor    metastatic  . CKD (chronic kidney disease), stage III   . COPD (chronic obstructive pulmonary disease) ([facility])    PM oxygen  . Current occasional smoker    1-2 cigarettes / week  . Diabetes mellitus type 2, insulin dependent ([place])   . Heart failure with preserved ejection fraction ([place])    2017 echo EF 60-65%, no RWMA, mild MR,    . Hyperlipidemia   . Hypertension   . Pancreatitis   . Polycythemia      Past Surgical History: Past Surgical History:  Procedure Laterality Date  . A-FLUTTER ABLATION N/A 10/14/2017   Procedure: A-FLUTTER ABLATION;  Surgeon: Constance Haw, MD;  Location: [facility];  Service: Cardiovascular;  Laterality: N/A;  . CARDIOVERSION N/A 09/08/2017   Procedure: CARDIOVERSION;  Surgeon: [name], MD;  Location: [facility];  Service: Cardiovascular;  Laterality: N/A;  . CARDIOVERSION N/A 03/22/2018   Procedure: CARDIOVERSION;  Surgeon: [name], MD;  Location: [facility];  Service: Cardiovascular;  Laterality: N/A;  . COLONOSCOPY    . COLONOSCOPY WITH PROPOFOL N/A 10/31/2019   Procedure: COLONOSCOPY WITH PROPOFOL;  Surgeon: Lin Landsman, MD;  Location: [facility];  Service: Gastroenterology;  Laterality: N/A;  . ELECTROPHYSIOLOGIC STUDY N/A 10/27/2016   Procedure: A-Flutter Ablation;  Surgeon: Deboraha Sprang, MD;  Location: [facility];  Service: Cardiovascular;  Laterality: N/A;  . ENDOSCOPIC RETROGRADE CHOLANGIOPANCREATOGRAPHY (ERCP) WITH PROPOFOL N/A 05/21/2020   Procedure: ENDOSCOPIC RETROGRADE CHOLANGIOPANCREATOGRAPHY (ERCP) WITH PROPOFOL;  Surgeon: [name], MD;  Location: [facility] ENDOSCOPY;  Service: Endoscopy;  Laterality: N/A;  . EUS N/A 12/22/2017   Procedure: FULL UPPER ENDOSCOPIC ULTRASOUND (EUS) RADIAL;  Surgeon: Reita Cliche, MD;  Location: [facility] ENDOSCOPY;  Service: Gastroenterology;  Laterality: N/A;  . TEE WITH CARDIOVERSION  12/16  . TEMPORARY DIALYSIS CATHETER N/A 02/08/2020   Procedure: TEMPORARY DIALYSIS CATHETER;  Surgeon: Algernon Huxley, MD;  Location: [facility];  Service: Cardiovascular;  Laterality: N/A;     Family History: Family History  Problem Relation Age of Onset  . Hypertension Father      Social History: Social History   Socioeconomic History  . Marital status: Widowed    Spouse name: Not on file  . Number of children: Not on file  . Years of education: Not on file  . Highest education level: Not on file  Occupational History  . Not on  file  Tobacco Use  . Smoking status: Light Tobacco Smoker    Years: 45.00    Types: Cigarettes    Last attempt to quit: 11/27/2012     Years since quitting: 7.4  . Smokeless tobacco: Never Used  Vaping Use  . Vaping Use: Never used  Substance and Sexual Activity  . Alcohol use: Yes    Alcohol/week: 2.0 standard drinks    Types: 2 Cans of beer per week    Comment: beer every once a while once a monthnone last 24hrs  . Drug use: No  . Sexual activity: Not on file  Other Topics Concern  . Not on file  Social History Narrative  . Not on file   Social Determinants of Health   Financial Resource Strain:   . Difficulty of Paying Living Expenses:   Food Insecurity:   . Worried About Charity fundraiser in the Last Year:   . Arboriculturist in the Last Year:   Transportation Needs:   . Film/video editor (Medical):   Marland Kitchen Lack of Transportation (Non-Medical):   Physical Activity:   . Days of Exercise per Week:   . Minutes of Exercise per Session:   Stress:   . Feeling of Stress :   Social Connections:   . Frequency of Communication with Friends and Family:   . Frequency of Social Gatherings with Friends and Family:   . Attends Religious Services:   . Active Member of Clubs or Organizations:   . Attends Archivist Meetings:   Marland Kitchen Marital Status:   Intimate Partner Violence:   . Fear of Current or Ex-Partner:   . Emotionally Abused:   Marland Kitchen Physically Abused:   . Sexually Abused:      Review of Systems: Not available Gen:  HEENT:  CV:  Resp:  GI: GU :  MS:  Derm:    Psych: Heme:  Neuro:  Endocrine  Vital Signs: Blood pressure 97/68, pulse 62, temperature 98 F (36.7 C), temperature source Axillary, resp. rate (!) 5, height _0  (1.702 m), weight 63.9 kg, SpO2 100 %.   Intake/Output Summary (Last 24 hours) at 05/22/2020 1147 Last data filed at 05/22/2020 6659 Gross per 24 hour  Intake 7908.44 ml  Output 200 ml  Net 7708.44 ml    Weight trends: Filed Weights   05/18/20 1745 05/19/20 0206  Weight: 65.8 kg 63.9 kg    Physical Exam: General:  Critically ill-appearing, laying in the  bed  HEENT  ET tube in place  Neck: No JVD  Heart::  Regular, tachycardic  Abdomen:  Soft, nondistended  Extremities:  Trace to 1+ dependent edema  Neurologic:  Sedated  Skin:  Jaundice  Foley:  In place       Lab results: Basic Metabolic Panel: Recent Labs  Lab 05/21/20 1937 05/22/20 0526 05/22/20 0928  NA 134* 137 134*  K 4.8 4.8 4.6  CL 108 106 104  CO2 15* 20* 18*  GLUCOSE 125* 157* 159*  BUN 67* 70* 72*  CREATININE 4.32* 4.00* 4.04*  CALCIUM 7.6* 6.9* 6.8*  MG  --  1.6*  --     Liver Function Tests: Recent Labs  Lab 05/22/20 0526  AST 357*  ALT 231*  ALKPHOS 798*  BILITOT 9.1*  PROT 4.5*  ALBUMIN 1.9*   Recent Labs  Lab 05/22/20 1036  LIPASE 22   No results for input(s): AMMONIA in the last 168 hours.  CBC: Recent Labs  Lab  05/21/20 0741 05/21/20 0741 05/21/20 1850 05/22/20 0526  WBC 25.8*   < > 46.9* 21.4*  NEUTROABS 19.6*  --   --  19.1*  HGB 8.8*   < > 11.0* 8.5*  HCT 27.3*   < > 33.4* 25.9*  MCV 92.9   < > 91.3 91.8  PLT 52*   < > 81* 66*   < > = values in this interval not displayed.    Cardiac Enzymes: No results for input(s): CKTOTAL, TROPONINI in the last 168 hours.  BNP: Invalid input(s): POCBNP  CBG: Recent Labs  Lab 05/21/20 1942 05/21/20 2342 05/22/20 0358 05/22/20 0710 05/22/20 1139  GLUCAP 109* 148* 150* 138* 153*    Microbiology: Recent Results (from the past 720 hour(s))  SARS Coronavirus 2 by RT PCR (hospital order, performed in [facility] hospital lab) Nasopharyngeal Urine, Clean Catch     Status: None   Collection Time: 05/18/20 11:30 PM   Specimen: Urine, Clean Catch; Nasopharyngeal  Result Value Ref Range Status   SARS Coronavirus 2 NEGATIVE NEGATIVE Final    Comment: (NOTE) SARS-CoV-2 target nucleic acids are NOT DETECTED.  The SARS-CoV-2 RNA is generally detectable in upper and lower respiratory specimens during the acute phase of infection. The lowest concentration of SARS-CoV-2 viral copies  this assay can detect is 250 copies / mL. A negative result does not preclude SARS-CoV-2 infection and should not be used as the sole basis for treatment or other patient management decisions.  A negative result may occur with improper specimen collection / handling, submission of specimen other than nasopharyngeal swab, presence of viral mutation(s) within the areas targeted by this assay, and inadequate number of viral copies (<250 copies / mL). A negative result must be combined with clinical observations, patient history, and epidemiological information.  Fact Sheet for Patients:   StrictlyIdeas.no  Fact Sheet for Healthcare Providers: BankingDealers.co.za  This test is not yet approved or  cleared by the [place] FDA and has been authorized for detection and/or diagnosis of SARS-CoV-2 by FDA under an Emergency Use Authorization (EUA).  This EUA will remain in effect (meaning this test can be used) for the duration of the COVID-19 declaration under Section 564(b)(1) of the Act, 21 U.S.C. section 360bbb-3(b)(1), unless the authorization is terminated or revoked sooner.  Performed at [facility], [address] [place], [place] [address]   CULTURE, BLOOD (ROUTINE X 2) w Reflex to ID Panel     Status: Abnormal (Preliminary result)   Collection Time: 05/19/20  3:02 PM   Specimen: BLOOD  Result Value Ref Range Status   Specimen Description   Final    BLOOD RIGHT ANTECUBITAL Performed at [facility], [address], [place], [place] [address]    Special Requests   Final    BOTTLES DRAWN AEROBIC AND ANAEROBIC Blood Culture adequate volume Performed at [facility], [address], [place], [place] [address]    Culture  Setup Time   Final    GRAM NEGATIVE RODS IN BOTH AEROBIC AND ANAEROBIC BOTTLES CRITICAL RESULT CALLED TO, READ BACK BY AND VERIFIED WITH: Hart Robinsons PHARMD [id number] 05/20/20 HNM     Culture (A)  Final    KLEBSIELLA PNEUMONIAE ESCHERICHIA COLI SUSCEPTIBILITIES TO FOLLOW Performed at [facility], [place] [address] [place],  [address]    Report Status PENDING  Incomplete  Blood Culture ID Panel (Reflexed)     Status: Abnormal   Collection Time: 05/19/20  3:02 PM  Result Value Ref Range Status   Enterococcus species NOT DETECTED NOT DETECTED Final   Listeria monocytogenes NOT DETECTED NOT DETECTED Final   Staphylococcus species NOT DETECTED NOT DETECTED Final   Staphylococcus aureus (BCID) NOT DETECTED NOT DETECTED Final   Streptococcus species NOT DETECTED NOT DETECTED Final   Streptococcus agalactiae NOT DETECTED NOT DETECTED Final   Streptococcus pneumoniae NOT DETECTED NOT DETECTED Final   Streptococcus pyogenes NOT DETECTED NOT DETECTED Final   Acinetobacter baumannii NOT DETECTED NOT DETECTED Final   Enterobacteriaceae species DETECTED (A) NOT DETECTED Final    Comment: CRITICAL RESULT CALLED TO, READ BACK BY AND VERIFIED WITH:  SCOTT HALL PHARMD 0335 05/20/20 HNM    Enterobacter cloacae complex NOT DETECTED NOT DETECTED Final   Escherichia coli DETECTED (A) NOT DETECTED Final    Comment: CRITICAL RESULT CALLED TO, READ BACK BY AND VERIFIED WITH: SCOTT HALL PHARMD 0335 05/20/20 HNM    Klebsiella oxytoca NOT DETECTED NOT DETECTED Final   Klebsiella pneumoniae DETECTED (A) NOT DETECTED Final    Comment: CRITICAL RESULT CALLED TO, READ BACK BY AND VERIFIED WITH: SCOTT HALL PHARMD 0335 05/20/20 HNM    Proteus species NOT DETECTED NOT DETECTED Final   Serratia marcescens NOT DETECTED NOT DETECTED Final   Carbapenem resistance NOT DETECTED NOT DETECTED Final   Haemophilus influenzae NOT DETECTED NOT DETECTED Final   Neisseria meningitidis NOT DETECTED NOT DETECTED Final   Pseudomonas aeruginosa NOT DETECTED NOT DETECTED Final   Candida albicans NOT DETECTED NOT DETECTED Final   Candida glabrata NOT DETECTED NOT DETECTED Final   Candida krusei NOT  DETECTED NOT DETECTED Final   Candida parapsilosis NOT DETECTED NOT DETECTED Final   Candida tropicalis NOT DETECTED NOT DETECTED Final    Comment: Performed at [facility], [address] [place], [place] [address]  CULTURE, BLOOD (ROUTINE X 2) w Reflex to ID Panel     Status: Abnormal   Collection Time: 05/19/20  3:14 PM   Specimen: BLOOD  Result Value Ref Range Status   Specimen Description   Final    BLOOD BLOOD RIGHT HAND Performed at [facility], [address], [place], [place] [address]    Special Requests   Final    BOTTLES DRAWN AEROBIC AND ANAEROBIC Blood Culture adequate volume Performed at [facility], [address] [place], [place] [address]    Culture  Setup Time   Final    GRAM NEGATIVE RODS IN BOTH AEROBIC AND ANAEROBIC BOTTLES CRITICAL VALUE NOTED.  VALUE IS CONSISTENT WITH PREVIOUSLY REPORTED AND CALLED VALUE. Performed at [facility], [address] [place], [place] [address]    Culture KLEBSIELLA PNEUMONIAE (A)  Final   Report Status 05/22/2020 FINAL  Final   Organism ID, Bacteria KLEBSIELLA PNEUMONIAE  Final      Susceptibility   Klebsiella pneumoniae - MIC*    AMPICILLIN RESISTANT Resistant     CEFAZOLIN <=4 SENSITIVE Sensitive     CEFEPIME <=0.12 SENSITIVE Sensitive     CEFTAZIDIME <=1 SENSITIVE Sensitive     CEFTRIAXONE <=0.25 SENSITIVE Sensitive     CIPROFLOXACIN <=0.25 SENSITIVE Sensitive     GENTAMICIN <=1 SENSITIVE Sensitive     IMIPENEM <=0.25 SENSITIVE Sensitive     TRIMETH/SULFA <=20 SENSITIVE Sensitive     AMPICILLIN/SULBACTAM 4 SENSITIVE Sensitive     PIP/TAZO <=4 SENSITIVE Sensitive     * KLEBSIELLA PNEUMONIAE  MRSA PCR Screening     Status: None   Collection Time: 05/21/20  5:24 PM   Specimen: Nasopharyngeal  Result Value Ref Range Status   MRSA by PCR NEGATIVE NEGATIVE Final    Comment:        The GeneXpert MRSA Assay (FDA approved for NASAL specimens only), is one component of  a comprehensive MRSA colonization surveillance program. It is not intended to diagnose MRSA infection nor to guide or monitor treatment for MRSA infections. Performed at [facility], [address] [place], [place] [address]      Coagulation Studies: Recent Labs    05/21/20 1850 05/22/20 0928  LABPROT 15.6* 16.4*  INR 1.3* 1.4*    Urinalysis: No results for input(s): COLORURINE, LABSPEC, PHURINE, GLUCOSEU, HGBUR, BILIRUBINUR, KETONESUR, PROTEINUR, UROBILINOGEN, NITRITE, LEUKOCYTESUR in the last 72 hours.  Invalid input(s): APPERANCEUR      Imaging: DG Chest Port 1 View  Result Date: 05/21/2020 CLINICAL DATA:  Central line placement. EXAM: PORTABLE CHEST 1 VIEW COMPARISON:  Earlier this day. FINDINGS: Tip of the left internal jugular central venous catheter projects over the mid SVC. No pneumothorax. Endotracheal tube tip remains at the thoracic inlet. Normal heart size with unchanged mediastinal contours. Vascular congestion with chronic interstitial coarsening. Slight increase in left lung base atelectasis. No large pleural effusion. IMPRESSION: 1. Tip of the left internal jugular central venous catheter projects over the mid SVC. No pneumothorax. 2. Slight increase in left lung base atelectasis. Otherwise unchanged appearance of the chest. Electronically Signed   By: Keith Rake M.D.   On: 05/21/2020 20:53   DG Chest Port 1 View  Result Date: 05/21/2020 CLINICAL DATA:  Atrial fibrillation, pancreatitis, intubation EXAM: PORTABLE CHEST 1 VIEW COMPARISON:  Radiograph 02/08/2020 FINDINGS: Endotracheal tube 4.5 cm from the carina. Telemetry leads overlie the chest. Chronically coarsened interstitial opacities are present throughout the lungs, similar to the comparison. Hypoventilatory changes likely increased prominence of these findings. Mild central vascular congestion without convincing features of edema. No pneumothorax or effusion. The aorta is calcified. The  remaining cardiomediastinal contours are unremarkable. No acute osseous or soft tissue abnormality. Degenerative changes are present in the imaged spine and shoulders. IMPRESSION: 1. Endotracheal tube 4.5 cm from the carina. 2. Chronically coarsened interstitial opacities, similar to the comparison. 3. Mild central vascular congestion without convincing features of edema. 4. Atelectasis. Electronically Signed   By: Lovena Le M.D.   On: 05/21/2020 19:09   DG C-Arm 1-60 Min-No Report  Result Date: 05/21/2020 Fluoroscopy was utilized by the requesting physician.  No radiographic interpretation.      Assessment & Plan: Pt is a 74 y.o.  medical problems of Pancreatic neuroendocrine tumor, chronic pancreatitis, atrial flutter status post ablation, requiring anticoagulation with Eliquis, hypertension, coronary disease, COPD, insulin-dependent type 2 diabetes, CKD stage IIIb,   male with , was admitted on 05/18/2020 with Epigastric pain [R10.13] Abdominal pain [R10.9] Malignant neoplasm of pancreas, unspecified location of malignancy ([place]) [C25.9]  #.  Acute kidney injury on chronic kidney disease stage IV Baseline creatinine 2.6/GFR 27  Urinalysis (July 4) proteinuria 100 mg/dL, 0-5 RBCs, 0-5 WBC Imaging: July 5 CT abdomen: Pancreatitis, pancreatic pseudocysts, bile duct distention, left adrenal mass, mild perinephric stranding on the left.  No hydronephrosis  Acute kidney injury is likely secondary to concurrent illness, sepsis leading to ATN Urine output is low at 200 cc Serum creatinine trend is worsening  Overall patient has multiple underlying comorbidities and prognosis appears poor.  Evaluate family discussion.  If aggressive care is desired, he may end up needing renal replacement therapy  in the form of CRRT.  Electrolytes and volume status are acceptable.  No acute indication for dialysis today.  #Acute respiratory failure Requiring ventilator support  #Sepsis Klebsiella bacteremia  from July 5.  Currently being treated with meropenem.  Dose adjustment per pharmacy  #Pancreatic neuroendocrine tumor with pseudocyst, worsening biliary duct dilatation, patient underwent ERCP on July 7 where occluded stent in the common bile duct was seen.  He underwent pus drainage from the biliary tree, stent placement in the common bile duct    LOS: 3 Keneth Borg Candiss Norse 7/8/202111:47 AM    Note: This note was prepared with Dragon dictation. Any transcription errors are unintentional

## 2020-05-22 NOTE — Anesthesia Postprocedure Evaluation (Signed)
Anesthesia Post Note  Patient: [name]  Procedure(s) Performed: ENDOSCOPIC RETROGRADE CHOLANGIOPANCREATOGRAPHY (ERCP) WITH PROPOFOL (N/A )  Patient location during evaluation: SICU Anesthesia Type: General Level of consciousness: sedated, responds to stimulation and patient remains intubated per anesthesia plan Pain management: pain level controlled Vital Signs Assessment: post-procedure vital signs reviewed and stable Respiratory status: patient remains intubated per anesthesia plan Cardiovascular status: stable Postop Assessment: no apparent nausea or vomiting Anesthetic complications: no   No complications documented.   Last Vitals:  Vitals:   05/22/20 0600 05/22/20 0700  BP: 126/66 122/67  Pulse: 60 (!) 59  Resp:    Temp:    SpO2: 99% 98%    Last Pain:  Vitals:   05/22/20 0400  TempSrc: Oral  PainSc:                  Lia Foyer

## 2020-05-22 NOTE — Consult Note (Signed)
SURGICAL CONSULTATION NOTE   HISTORY OF PRESENT ILLNESS (HPI):  74 y.o. male admitted to [facility] for management of ascending colitis.  At the moment of my evaluation the patient was intubated on mechanical ventilation and critically ill so he was unable to give history.  History taken from ICU physician.  The patient was receiving treatment in the hospital for ascending colon area with IV antibiotic therapy.  Since patient was not responding he underwent ERCP.  ERCP done yesterday showed clogged stent.  The stent was exchanged for a new 1.  ERCP was able to drain pus from the common bile duct.  The cause of the cholangitis and obstruction of the common bile duct is coming from normal in extraction from pancreatic mass.  After the procedure the patient has been deteriorating due to multiple medical comorbidities.  Surgery is consulted by Dr. Lanney Gins in this context for evaluation and management of ascending colitis.  PAST MEDICAL HISTORY (PMH):  Past Medical History:  Diagnosis Date  . Aortic atherosclerosis ([place])    As seen on 04/2019 CT  . Atrial flutter ([place])    a. s/p ablation 2017/2018 and DCCV 2015/2016/2019 b. 2017 echo EF 60-65%, no RWMA, mild MR,  . CAD (coronary artery disease)    3v CAD 04/2019 CT  . Carcinoid tumor    metastatic  . CKD (chronic kidney disease), stage III   . COPD (chronic obstructive pulmonary disease) ([facility])    PM oxygen  . Current occasional smoker    1-2 cigarettes / week  . Diabetes mellitus type 2, insulin dependent ([place])   . Heart failure with preserved ejection fraction ([place])    2017 echo EF 60-65%, no RWMA, mild MR,    . Hyperlipidemia   . Hypertension   . Pancreatitis   . Polycythemia      PAST SURGICAL HISTORY ([place]):  Past Surgical History:  Procedure Laterality Date  . A-FLUTTER ABLATION N/A 10/14/2017   Procedure: A-FLUTTER ABLATION;  Surgeon: Constance Haw, MD;  Location: [place];  Service: Cardiovascular;  Laterality: N/A;   . CARDIOVERSION N/A 09/08/2017   Procedure: CARDIOVERSION;  Surgeon: [name], MD;  Location: [facility] ORS;  Service: Cardiovascular;  Laterality: N/A;  . CARDIOVERSION N/A 03/22/2018   Procedure: CARDIOVERSION;  Surgeon: [name], MD;  Location: [facility] ORS;  Service: Cardiovascular;  Laterality: N/A;  . COLONOSCOPY    . COLONOSCOPY WITH PROPOFOL N/A 10/31/2019   Procedure: COLONOSCOPY WITH PROPOFOL;  Surgeon: Lin Landsman, MD;  Location: [facility] ENDOSCOPY;  Service: Gastroenterology;  Laterality: N/A;  . ELECTROPHYSIOLOGIC STUDY N/A 10/27/2016   Procedure: A-Flutter Ablation;  Surgeon: Deboraha Sprang, MD;  Location: [place] [facility];  Service: Cardiovascular;  Laterality: N/A;  . ENDOSCOPIC RETROGRADE CHOLANGIOPANCREATOGRAPHY (ERCP) WITH PROPOFOL N/A 05/21/2020   Procedure: ENDOSCOPIC RETROGRADE CHOLANGIOPANCREATOGRAPHY (ERCP) WITH PROPOFOL;  Surgeon: [name], MD;  Location: [facility] ENDOSCOPY;  Service: Endoscopy;  Laterality: N/A;  . EUS N/A 12/22/2017   Procedure: FULL UPPER ENDOSCOPIC ULTRASOUND (EUS) RADIAL;  Surgeon: Reita Cliche, MD;  Location: [facility] ENDOSCOPY;  Service: Gastroenterology;  Laterality: N/A;  . TEE WITH CARDIOVERSION  12/16  . TEMPORARY DIALYSIS CATHETER N/A 02/08/2020   Procedure: TEMPORARY DIALYSIS CATHETER;  Surgeon: Algernon Huxley, MD;  Location: [place] [facility];  Service: Cardiovascular;  Laterality: N/A;     MEDICATIONS:  Prior to Admission medications   Medication Sig Start Date End Date Taking? Authorizing Provider  acetaminophen (TYLENOL) 500 MG tablet Take  500-1,000 mg by mouth every 6 (six) hours as needed for moderate pain or headache.     [provider]  albuterol (PROAIR HFA) 108 (90 Base) MCG/ACT inhaler Inhale 2 puffs into the lungs every 4 (four) hours as needed for wheezing or shortness of breath.     [provider]  amiodarone (PACERONE) 200 MG tablet Take 1 tablet (200 mg total) by mouth daily. 04/17/20   [name], MD  apixaban (ELIQUIS) 2.5 MG TABS tablet Take 1 tablet (2.5 mg total) by mouth 3 (three) times daily. 04/25/20   [name], MD  budesonide-formoterol (SYMBICORT) 80-4.5 MCG/ACT inhaler Inhale 2 puffs into the lungs 2 (two) times daily. Takes at noon and at 4pm.    [provider]  EUTHYROX 25 MCG tablet Take 1 tablet (25 mcg total) by mouth every morning. 04/17/20   [name], MD  Insulin Glargine (LANTUS SOLOSTAR) 100 UNIT/ML Solostar Pen Inject 5 Units into the skin at bedtime.  11/14/19 11/13/20  [provider]  levothyroxine (SYNTHROID, LEVOTHROID) 50 MCG tablet Take 25 mcg by mouth daily.  06/13/18   [provider]  metoprolol succinate (TOPROL-XL) 50 MG 24 hr tablet TAKE 1 TABLET BY MOUTH IN THE EVENING. TAKE WITH OR IMMEDIATELY FOLLOWING A MEAL 04/08/20   Marrianne Mood D, PA-C  midodrine (PROAMATINE) 10 MG tablet Take 1 tablet (10 mg total) by mouth 3 (three) times daily. 04/21/20   [name], MD  omeprazole (PRILOSEC) 20 MG capsule Take 1 capsule by mouth once daily 04/15/20   [name], MD  rosuvastatin (CRESTOR) 40 MG tablet Take 1 tablet (40 mg total) by mouth daily. 04/17/20   [name], MD  vitamin B-12 (CYANOCOBALAMIN) 500 MCG tablet Take 500 mcg by mouth daily.     [provider]     ALLERGIES:  Allergies  Allergen Reactions  . Levaquin [Levofloxacin In D5w] Swelling and Rash     SOCIAL HISTORY:  Social History   Socioeconomic History  . Marital status: Widowed    Spouse name: Not on file  . Number of children: Not on file  . Years of education: Not on file  . Highest education level: Not on file  Occupational History  . Not on file  Tobacco Use  . Smoking status: Light Tobacco Smoker    Years: 45.00    Types: Cigarettes    Last attempt to quit: 11/27/2012    Years since quitting: 7.4  . Smokeless tobacco: Never Used  Vaping Use  . Vaping Use: Never used  Substance and Sexual Activity  . Alcohol use: Yes     Alcohol/week: 2.0 standard drinks    Types: 2 Cans of beer per week    Comment: beer every once a while once a monthnone last 24hrs  . Drug use: No  . Sexual activity: Not on file  Other Topics Concern  . Not on file  Social History Narrative  . Not on file   Social Determinants of Health   Financial Resource Strain:   . Difficulty of Paying Living Expenses:   Food Insecurity:   . Worried About Charity fundraiser in the Last Year:   . Arboriculturist in the Last Year:   Transportation Needs:   . Film/video editor (Medical):   Marland Kitchen Lack of Transportation (Non-Medical):   Physical Activity:   . Days of Exercise per Week:   . Minutes of Exercise per Session:   Stress:   .  Feeling of Stress :   Social Connections:   . Frequency of Communication with Friends and Family:   . Frequency of Social Gatherings with Friends and Family:   . Attends Religious Services:   . Active Member of Clubs or Organizations:   . Attends Archivist Meetings:   Marland Kitchen Marital Status:   Intimate Partner Violence:   . Fear of Current or Ex-Partner:   . Emotionally Abused:   Marland Kitchen Physically Abused:   . Sexually Abused:       FAMILY HISTORY:  Family History  Problem Relation Age of Onset  . Hypertension Father      REVIEW OF SYSTEMS:  Unable to take review of system due to patient sedated on mechanical ventilation.  VITAL SIGNS:  Temp:  [97.5 F (36.4 C)-98.5 F (36.9 C)] 98 F (36.7 C) (07/08 1800) Pulse Rate:  [48-98] 64 (07/08 1800) Resp:  [5-18] 5 (07/07 2215) BP: (52-133)/(38-81) 97/59 (07/08 1800) SpO2:  [98 %-100 %] 100 % (07/08 1800) Arterial Line BP: (60-162)/(29-88) 141/83 (07/08 1800) FiO2 (%):  [35 %-40 %] 35 % (07/08 1539)     Height: 5\' 7"  (170.2 cm) Weight: 63.9 kg BMI (Calculated): 22.06   INTAKE/OUTPUT:  This shift: Total I/O In: 1946.9 [I.V.:1696.9; IV Piggyback:250] Out: -   Last 2 shifts: @IOLAST2SHIFTS @   PHYSICAL EXAM:  Constitutional:  -- Normal  body habitus  --Critically ill on mechanical ventilation, sedated Eyes:  -- Pupils equally round and reactive to light  --Positive for scleral icterus  Ear, nose, and throat:  -- No jugular venous distension  Pulmonary:  --The bibasilar crackles  -- Equal breath sounds bilaterally Cardiovascular:  -- S1, S2 present  -- No pericardial rubs --Tachycardic, irregular rhythm Gastrointestinal:  -- Abdomen soft, nontender, non-distended, no guarding or rebound tenderness -- No abdominal masses appreciated, pulsatile or otherwise  Musculoskeletal and Integumentary:  -- Wounds: None appreciated -- Extremities: B/L UE and LE FROM, hands and feet warm, no edema  Neurologic:  --Sedated   Labs:  CBC Latest Ref Rng & Units 05/22/2020 05/21/2020 05/21/2020  WBC 4.0 - 10.5 K/uL 21.4(H) 46.9(H) 25.8(H)  Hemoglobin 13.0 - 17.0 g/dL 8.5(L) 11.0(L) 8.8(L)  Hematocrit 39 - 52 % 25.9(L) 33.4(L) 27.3(L)  Platelets 150 - 400 K/uL 66(L) 81(L) 52(L)   CMP Latest Ref Rng & Units 05/22/2020 05/22/2020 05/21/2020  Glucose 70 - 99 mg/dL 159(H) 157(H) 125(H)  BUN 8 - 23 mg/dL 72(H) 70(H) 67(H)  Creatinine 0.61 - 1.24 mg/dL 4.04(H) 4.00(H) 4.32(H)  Sodium 135 - 145 mmol/L 134(L) 137 134(L)  Potassium 3.5 - 5.1 mmol/L 4.6 4.8 4.8  Chloride 98 - 111 mmol/L 104 106 108  CO2 22 - 32 mmol/L 18(L) 20(L) 15(L)  Calcium 8.9 - 10.3 mg/dL 6.8(L) 6.9(L) 7.6(L)  Total Protein 6.5 - 8.1 g/dL - 4.5(L) -  Total Bilirubin 0.3 - 1.2 mg/dL - 9.1(H) -  Alkaline Phos 38 - 126 U/L - 798(H) -  AST 15 - 41 U/L - 357(H) -  ALT 0 - 44 U/L - 231(H) -    Imaging studies:  Impression evaluated the images of the ERCP  Assessment/Plan:  74 y.o. male with ascending cholangitis, complicated by pertinent comorbidities including critically ill mechanical ventilation due to septic shock, acute kidney failure, pancreatic cancer.  Patient critically ill on mechanical relation.  Patient with septic shock due to ascending cholangitis.  I think  that the patient had adequate treatment for the ascending cholangitis with ERCP and the change of  occluded stent.  Please a patient with a pancreatic cancer the most likely does not have significant reserve from his system and is trying to fight against severe ascending cholangitis.  I think that the supportive management and the ERCP with exchange of the stent is the best treatment the patient can receive.  I do not consider any surgical intervention will help the patient at this moment.  Patient also a poor surgical candidate at this moment.  I discussed the case with ICU physician and discussed that there is nothing that surgery can offer to help with this patient.  Patient will continue with IV antibiotic therapy and aggressive supportive management by critical care team.  Patient is being followed by gastroenterology.  Arnold Long, MD

## 2020-05-23 DIAGNOSIS — A4151 Sepsis due to Escherichia coli [E. coli]: Secondary | ICD-10-CM

## 2020-05-23 DIAGNOSIS — R945 Abnormal results of liver function studies: Secondary | ICD-10-CM

## 2020-05-23 DIAGNOSIS — R6520 Severe sepsis without septic shock: Secondary | ICD-10-CM

## 2020-05-23 DIAGNOSIS — N189 Chronic kidney disease, unspecified: Secondary | ICD-10-CM

## 2020-05-23 LAB — GLUCOSE, CAPILLARY
Glucose-Capillary: 131 mg/dL — ABNORMAL HIGH (ref 70–99)
Glucose-Capillary: 100 mg/dL — ABNORMAL HIGH (ref 70–99)
Glucose-Capillary: 105 mg/dL — ABNORMAL HIGH (ref 70–99)
Glucose-Capillary: 106 mg/dL — ABNORMAL HIGH (ref 70–99)
Glucose-Capillary: 87 mg/dL (ref 70–99)
Glucose-Capillary: 87 mg/dL (ref 70–99)

## 2020-05-23 LAB — CBC WITH DIFFERENTIAL/PLATELET
Abs Immature Granulocytes: 1.57 10*3/uL — ABNORMAL HIGH (ref 0.00–0.07)
Basophils Absolute: 0.1 10*3/uL (ref 0.0–0.1)
Basophils Relative: 0 %
Eosinophils Absolute: 0.1 10*3/uL (ref 0.0–0.5)
Eosinophils Relative: 1 %
HCT: 25 % — ABNORMAL LOW (ref 39.0–52.0)
Hemoglobin: 8.1 g/dL — ABNORMAL LOW (ref 13.0–17.0)
Immature Granulocytes: 8 %
Lymphocytes Relative: 2 %
Lymphs Abs: 0.3 10*3/uL — ABNORMAL LOW (ref 0.7–4.0)
MCH: 29.6 pg (ref 26.0–34.0)
MCHC: 32.4 g/dL (ref 30.0–36.0)
MCV: 91.2 fL (ref 80.0–100.0)
Monocytes Absolute: 1 10*3/uL (ref 0.1–1.0)
Monocytes Relative: 5 %
Neutro Abs: 15.7 10*3/uL — ABNORMAL HIGH (ref 1.7–7.7)
Neutrophils Relative %: 84 %
Platelets: 68 10*3/uL — ABNORMAL LOW (ref 150–400)
RBC: 2.74 MIL/uL — ABNORMAL LOW (ref 4.22–5.81)
RDW: 13.8 % (ref 11.5–15.5)
Smear Review: NORMAL
WBC: 18.7 10*3/uL — ABNORMAL HIGH (ref 4.0–10.5)
nRBC: 0.5 % — ABNORMAL HIGH (ref 0.0–0.2)

## 2020-05-23 LAB — COMPREHENSIVE METABOLIC PANEL
ALT: 298 U/L — ABNORMAL HIGH (ref 0–44)
AST: 536 U/L — ABNORMAL HIGH (ref 15–41)
Albumin: 1.9 g/dL — ABNORMAL LOW (ref 3.5–5.0)
Alkaline Phosphatase: 889 U/L — ABNORMAL HIGH (ref 38–126)
Anion gap: 11 (ref 5–15)
BUN: 79 mg/dL — ABNORMAL HIGH (ref 8–23)
CO2: 25 mmol/L (ref 22–32)
Calcium: 6.7 mg/dL — ABNORMAL LOW (ref 8.9–10.3)
Chloride: 101 mmol/L (ref 98–111)
Creatinine, Ser: 4.29 mg/dL — ABNORMAL HIGH (ref 0.61–1.24)
GFR calc Af Amer: 15 mL/min — ABNORMAL LOW (ref >60)
GFR calc non Af Amer: 13 mL/min — ABNORMAL LOW (ref >60)
Glucose, Bld: 121 mg/dL — ABNORMAL HIGH (ref 70–99)
Potassium: 4.2 mmol/L (ref 3.5–5.1)
Sodium: 137 mmol/L (ref 135–145)
Total Bilirubin: 9.7 mg/dL — ABNORMAL HIGH (ref 0.3–1.2)
Total Protein: 4.3 g/dL — ABNORMAL LOW (ref 6.5–8.1)

## 2020-05-23 LAB — PHOSPHORUS: Phosphorus: 6.7 mg/dL — ABNORMAL HIGH (ref 2.5–4.6)

## 2020-05-23 LAB — CULTURE, BLOOD (ROUTINE X 2): Special Requests: ADEQUATE

## 2020-05-23 LAB — MAGNESIUM: Magnesium: 2 mg/dL (ref 1.7–2.4)

## 2020-05-23 LAB — PROCALCITONIN: Procalcitonin: 28.71 ng/mL

## 2020-05-23 MED ORDER — PIPERACILLIN-TAZOBACTAM 3.375 G IVPB
3.3750 g | Freq: Two times a day (BID) | INTRAVENOUS | Status: DC
  Administered 2020-05-23 (×2): 3.375 g via INTRAVENOUS
  Filled 2020-05-23 (×4): qty 50

## 2020-05-23 MED ORDER — ALBUMIN HUMAN 25 % IV SOLN
12.5000 g | Freq: Every day | INTRAVENOUS | Status: DC
  Administered 2020-05-23 – 2020-05-27 (×5): 12.5 g via INTRAVENOUS
  Filled 2020-05-23 (×6): qty 50

## 2020-05-23 NOTE — Progress Notes (Signed)
Patient successfully extubated per MD order, no complications, O2 sat is 99-100 on 2 lpm. Arterial line removed per MD order, pressure held for 5 minutes, tip is intact. No complications, will continue to monitor

## 2020-05-23 NOTE — Progress Notes (Signed)
[address] [place], [place] [address] Phone [phone]. Fax [phone]  Date: 05/23/2020                  Patient Name:  [name]  MRN: [id number]  DOB: 21-Oct-1946  Age / Sex: 74 y.o., male         PCP: [name], MD                 Service Requesting Consult: IM/ Ottie Glazier, MD                 Reason for Consult: ARF            History of Present Illness: Patient is a 74 y.o. male with medical problems of Pancreatic neuroendocrine tumor, chronic pancreatitis, atrial flutter status post ablation, requiring anticoagulation with Eliquis, hypertension, coronary disease, COPD, insulin-dependent type 2 diabetes, CKD stage IIIb, who [facility] for evaluation of Epigastric pain [R10.13] Abdominal pain [R10.9] Malignant neoplasm of pancreas, unspecified location of malignancy ([place]) [C25.9]  Patient has baseline chronic diffuse abdominal pain from his pancreatic cancer but came in for acute worsening with nausea and vomiting. Nephrology consult has been requested for evaluation of acute renal failure Patient is currently intubated and on multiple pressors.  He is not able to provide any meaningful information.  All information is obtained from the chart and primary team.  Today, patient remains critically ill but more alert.  He is able to follow simple commands FiO2 lower at 28% Fentanyl is off He is only on vasopressin now along with IV bicarbonate infusion 07/08 0701 - 07/09 0700 In: 4672 [I.V.:4322; IV Piggyback:350] Out: 535 [Urine:535]   Current medications: Current Facility-Administered Medications  Medication Dose Route Frequency Provider Last Rate Last Admin  . 0.9 %  sodium chloride infusion   Intravenous PRN Swayze, Ava, DO   Stopped at 05/20/20 0919  . 0.9 %  sodium chloride infusion  250 mL Intravenous Continuous Ottie Glazier, MD      . acetaminophen (TYLENOL) tablet 1,000 mg  1,000 mg Oral Q6H PRN  Sharion Settler, NP   1,000 mg at 05/20/20 0000  . albumin human 25 % solution 12.5 g  12.5 g Intravenous Daily Ottie Glazier, MD   Stopped at 05/23/20 1200  . chlorhexidine gluconate (MEDLINE KIT) (PERIDEX) 0.12 % solution 15 mL  15 mL Mouth Rinse BID Ottie Glazier, MD   15 mL at 05/23/20 0814  . Chlorhexidine Gluconate Cloth 2 % PADS 6 each  6 each Topical Daily Ottie Glazier, MD   6 each at 05/22/20 2210  . fentaNYL (SUBLIMAZE) bolus via infusion 50 mcg  50 mcg Intravenous Q15 min PRN Ottie Glazier, MD   50 mcg at 05/23/20 0739  . hydrocortisone sodium succinate (SOLU-CORTEF) 100 MG injection 50 mg  50 mg Intravenous Q6H Darel Hong D, NP   50 mg at 05/23/20 0092  . insulin aspart (novoLOG) injection 0-9 Units  0-9 Units Subcutaneous Q4H Darel Hong D, NP   1 Units at 05/23/20 0415  . insulin glargine (LANTUS) injection 12 Units  12 Units Subcutaneous QHS Tu, Ching T, DO   12 Units at 05/22/20 2209  . levothyroxine (SYNTHROID) tablet 25 mcg  25 mcg Oral q morning - 10a Tu, Ching T, DO   25 mcg at 05/21/20 0615  . MEDLINE mouth rinse  15 mL Mouth Rinse 10 times per day Ottie Glazier, MD   15 mL at 05/23/20 1242  .  midazolam (VERSED) injection 2 mg  2 mg Intravenous Q15 min PRN Ottie Glazier, MD   2 mg at 05/23/20 0123  . midodrine (PROAMATINE) tablet 10 mg  10 mg Oral TID Tu, Ching T, DO   10 mg at 05/21/20 1020  . mometasone-formoterol (DULERA) 100-5 MCG/ACT inhaler 2 puff  2 puff Inhalation BID Tu, Ching T, DO   2 puff at 05/21/20 0859  . morphine 2 MG/ML injection 1 mg  1 mg Intravenous Q2H PRN Tu, Ching T, DO   1 mg at 05/20/20 1613  . norepinephrine (LEVOPHED) 16 mg in 262m premix infusion  0-40 mcg/min Intravenous Titrated KBradly Bienenstock NP   Stopped at 05/23/20 [phone] . ondansetron (ZOFRAN) injection 4 mg  4 mg Intravenous Q6H PRN Tu, Ching T, DO   4 mg at 05/21/20 1252  . pantoprazole (PROTONIX) injection 40 mg  40 mg Intravenous Q24H GDallie Piles RPH   40 mg at  05/23/20 1252  . phenylephrine CONCENTRATED 1069min sodium chloride 0.9% 25028m0.4mg22m) infusion  0-400 mcg/min Intravenous Titrated Aleskerov, Fuad, MD      . piperacillin-tazobactam (ZOSYN) IVPB 3.375 g  3.375 g Intravenous Q12H RaviTsosie Billing 12.5 mL/hr at 05/23/20 1253 3.375 g at 05/23/20 1253  . sodium bicarbonate 150 mEq in sterile water 1,000 mL infusion   Intravenous Continuous AlesOttie Glazier 50 mL/hr at 05/23/20 1126 Rate Change at 05/23/20 1126  . vasopressin (PITRESSIN) 20 Units in sodium chloride 0.9 % 100 mL infusion-*FOR SHOCK*  0-0.03 Units/min Intravenous Continuous KeenDarel HongNP 9 mL/hr at 05/23/20 0735 0.03 Units/min at 05/23/20 0735  . vitamin B-12 (CYANOCOBALAMIN) tablet 500 mcg  500 mcg Oral Daily Tu, Ching T, DO   500 mcg at 05/22/20 1031     Vital Signs: Blood pressure (!) 107/56, pulse (!) 50, temperature 97.8 F (36.6 C), temperature source Oral, resp. rate (!) 5, height _0  (1.702 m), weight 63.9 kg, SpO2 94 %.   Intake/Output Summary (Last 24 hours) at 05/23/2020 1322 Last data filed at 05/23/2020 0641 Gross per 24 hour  Intake 4522.04 ml  Output 535 ml  Net 3987.04 ml    Weight trends: Filed Weights   05/18/20 1745 05/19/20 0206  Weight: 65.8 kg 63.9 kg    Physical Exam: General:  Critically ill-appearing, laying in the bed  HEENT  ET tube in place  Heart::  Regular, tachycardic  Abdomen:  Soft, nondistended  Extremities:  Trace to 1+ dependent edema  Neurologic:  Following simple commands  Skin:  Jaundice  Foley:  In place       Lab results: Basic Metabolic Panel: Recent Labs  Lab 05/22/20 0526 05/22/20 0928 05/23/20 0416  NA 137 134* 137  K 4.8 4.6 4.2  CL 106 104 101  CO2 20* 18* 25  GLUCOSE 157* 159* 121*  BUN 70* 72* 79*  CREATININE 4.00* 4.04* 4.29*  CALCIUM 6.9* 6.8* 6.7*  MG 1.6*  --  2.0  PHOS  --   --  6.7*    Liver Function Tests: Recent Labs  Lab 05/23/20 0416  AST 536*  ALT 298*  ALKPHOS  889*  BILITOT 9.7*  PROT 4.3*  ALBUMIN 1.9*   Recent Labs  Lab 05/22/20 1036  LIPASE 22   No results for input(s): AMMONIA in the last 168 hours.  CBC: Recent Labs  Lab 05/22/20 0526 05/23/20 0416  WBC 21.4* 18.7*  NEUTROABS 19.1* 15.7*  HGB 8.5* 8.1*  HCT 25.9*  25.0*  MCV 91.8 91.2  PLT 66* 68*    Cardiac Enzymes: No results for input(s): CKTOTAL, TROPONINI in the last 168 hours.  BNP: Invalid input(s): POCBNP  CBG: Recent Labs  Lab 05/22/20 2339 05/23/20 0413 05/23/20 0747 05/23/20 0750 05/23/20 1221  GLUCAP 133* 131* 100* 105* 106*    Microbiology: Recent Results (from the past 720 hour(s))  SARS Coronavirus 2 by RT PCR (hospital order, performed in [facility] hospital lab) Nasopharyngeal Urine, Clean Catch     Status: None   Collection Time: 05/18/20 11:30 PM   Specimen: Urine, Clean Catch; Nasopharyngeal  Result Value Ref Range Status   SARS Coronavirus 2 NEGATIVE NEGATIVE Final    Comment: (NOTE) SARS-CoV-2 target nucleic acids are NOT DETECTED.  The SARS-CoV-2 RNA is generally detectable in upper and lower respiratory specimens during the acute phase of infection. The lowest concentration of SARS-CoV-2 viral copies this assay can detect is 250 copies / mL. A negative result does not preclude SARS-CoV-2 infection and should not be used as the sole basis for treatment or other patient management decisions.  A negative result may occur with improper specimen collection / handling, submission of specimen other than nasopharyngeal swab, presence of viral mutation(s) within the areas targeted by this assay, and inadequate number of viral copies (<250 copies / mL). A negative result must be combined with clinical observations, patient history, and epidemiological information.  Fact Sheet for Patients:   StrictlyIdeas.no  Fact Sheet for Healthcare Providers: BankingDealers.co.za  This test is not yet  approved or  cleared by the [place] FDA and has been authorized for detection and/or diagnosis of SARS-CoV-2 by FDA under an Emergency Use Authorization (EUA).  This EUA will remain in effect (meaning this test can be used) for the duration of the COVID-19 declaration under Section 564(b)(1) of the Act, 21 U.S.C. section 360bbb-3(b)(1), unless the authorization is terminated or revoked sooner.  Performed at [facility], [address] [place], [place] [address]   CULTURE, BLOOD (ROUTINE X 2) w Reflex to ID Panel     Status: Abnormal   Collection Time: 05/19/20  3:02 PM   Specimen: BLOOD  Result Value Ref Range Status   Specimen Description   Final    BLOOD RIGHT ANTECUBITAL Performed at [facility], [address], [place], [place] [address]    Special Requests   Final    BOTTLES DRAWN AEROBIC AND ANAEROBIC Blood Culture adequate volume Performed at [facility], [address] [place], [place] [address]    Culture  Setup Time   Final    GRAM NEGATIVE RODS IN BOTH AEROBIC AND ANAEROBIC BOTTLES CRITICAL RESULT CALLED TO, READ BACK BY AND VERIFIED WITH: Hart Robinsons [facility] [id number] 05/20/20 HNM Performed at [facility], [place] [address], [place], [place] [address]    Culture KLEBSIELLA PNEUMONIAE ESCHERICHIA COLI  (A)  Final   Report Status 05/23/2020 FINAL  Final   Organism ID, Bacteria KLEBSIELLA PNEUMONIAE  Final   Organism ID, Bacteria ESCHERICHIA COLI  Final      Susceptibility   Escherichia coli - MIC*    AMPICILLIN 8 SENSITIVE Sensitive     CEFAZOLIN <=4 SENSITIVE Sensitive     CEFEPIME <=0.12 SENSITIVE Sensitive     CEFTAZIDIME <=1 SENSITIVE Sensitive     CEFTRIAXONE <=0.25 SENSITIVE Sensitive     CIPROFLOXACIN <=0.25 SENSITIVE Sensitive     GENTAMICIN <=1 SENSITIVE Sensitive     IMIPENEM <=0.25 SENSITIVE Sensitive  TRIMETH/SULFA <=20 SENSITIVE Sensitive     AMPICILLIN/SULBACTAM <=2 SENSITIVE Sensitive     PIP/TAZO <=4  SENSITIVE Sensitive     * ESCHERICHIA COLI   Klebsiella pneumoniae - MIC*    AMPICILLIN RESISTANT Resistant     CEFAZOLIN <=4 SENSITIVE Sensitive     CEFEPIME <=0.12 SENSITIVE Sensitive     CEFTAZIDIME <=1 SENSITIVE Sensitive     CEFTRIAXONE <=0.25 SENSITIVE Sensitive     CIPROFLOXACIN <=0.25 SENSITIVE Sensitive     GENTAMICIN <=1 SENSITIVE Sensitive     IMIPENEM <=0.25 SENSITIVE Sensitive     TRIMETH/SULFA <=20 SENSITIVE Sensitive     AMPICILLIN/SULBACTAM 4 SENSITIVE Sensitive     PIP/TAZO <=4 SENSITIVE Sensitive     * KLEBSIELLA PNEUMONIAE  Blood Culture ID Panel (Reflexed)     Status: Abnormal   Collection Time: 05/19/20  3:02 PM  Result Value Ref Range Status   Enterococcus species NOT DETECTED NOT DETECTED Final   Listeria monocytogenes NOT DETECTED NOT DETECTED Final   Staphylococcus species NOT DETECTED NOT DETECTED Final   Staphylococcus aureus (BCID) NOT DETECTED NOT DETECTED Final   Streptococcus species NOT DETECTED NOT DETECTED Final   Streptococcus agalactiae NOT DETECTED NOT DETECTED Final   Streptococcus pneumoniae NOT DETECTED NOT DETECTED Final   Streptococcus pyogenes NOT DETECTED NOT DETECTED Final   Acinetobacter baumannii NOT DETECTED NOT DETECTED Final   Enterobacteriaceae species DETECTED (A) NOT DETECTED Final    Comment: CRITICAL RESULT CALLED TO, READ BACK BY AND VERIFIED WITH:  SCOTT HALL PHARMD [id number] 05/20/20 HNM    Enterobacter cloacae complex NOT DETECTED NOT DETECTED Final   Escherichia coli DETECTED (A) NOT DETECTED Final    Comment: CRITICAL RESULT CALLED TO, READ BACK BY AND VERIFIED WITH: SCOTT HALL PHARMD 0335 05/20/20 HNM    Klebsiella oxytoca NOT DETECTED NOT DETECTED Final   Klebsiella pneumoniae DETECTED (A) NOT DETECTED Final    Comment: CRITICAL RESULT CALLED TO, READ BACK BY AND VERIFIED WITH: SCOTT HALL PHARMD 0335 05/20/20 HNM    Proteus species NOT DETECTED NOT DETECTED Final   Serratia marcescens NOT DETECTED NOT DETECTED Final    Carbapenem resistance NOT DETECTED NOT DETECTED Final   Haemophilus influenzae NOT DETECTED NOT DETECTED Final   Neisseria meningitidis NOT DETECTED NOT DETECTED Final   Pseudomonas aeruginosa NOT DETECTED NOT DETECTED Final   Candida albicans NOT DETECTED NOT DETECTED Final   Candida glabrata NOT DETECTED NOT DETECTED Final   Candida krusei NOT DETECTED NOT DETECTED Final   Candida parapsilosis NOT DETECTED NOT DETECTED Final   Candida tropicalis NOT DETECTED NOT DETECTED Final    Comment: Performed at [facility], [address] [place], [place] [address]  CULTURE, BLOOD (ROUTINE X 2) w Reflex to ID Panel     Status: Abnormal   Collection Time: 05/19/20  3:14 PM   Specimen: BLOOD  Result Value Ref Range Status   Specimen Description   Final    BLOOD BLOOD RIGHT HAND Performed at [facility], [address], [place], [place] [address]    Special Requests   Final    BOTTLES DRAWN AEROBIC AND ANAEROBIC Blood Culture adequate volume Performed at [facility], [address] [place], [place] [address]    Culture  Setup Time   Final    GRAM NEGATIVE RODS IN BOTH AEROBIC AND ANAEROBIC BOTTLES CRITICAL VALUE NOTED.  VALUE IS CONSISTENT WITH PREVIOUSLY REPORTED AND CALLED VALUE. Performed at [facility], [place], [place]  [address]    Culture KLEBSIELLA PNEUMONIAE (A)  Final   Report Status 05/22/2020 FINAL  Final   Organism ID, Bacteria KLEBSIELLA PNEUMONIAE  Final      Susceptibility   Klebsiella pneumoniae - MIC*    AMPICILLIN RESISTANT Resistant     CEFAZOLIN <=4 SENSITIVE Sensitive     CEFEPIME <=0.12 SENSITIVE Sensitive     CEFTAZIDIME <=1 SENSITIVE Sensitive     CEFTRIAXONE <=0.25 SENSITIVE Sensitive     CIPROFLOXACIN <=0.25 SENSITIVE Sensitive     GENTAMICIN <=1 SENSITIVE Sensitive     IMIPENEM <=0.25 SENSITIVE Sensitive     TRIMETH/SULFA <=20 SENSITIVE Sensitive     AMPICILLIN/SULBACTAM 4 SENSITIVE Sensitive      PIP/TAZO <=4 SENSITIVE Sensitive     * KLEBSIELLA PNEUMONIAE  MRSA PCR Screening     Status: None   Collection Time: 05/21/20  5:24 PM   Specimen: Nasopharyngeal  Result Value Ref Range Status   MRSA by PCR NEGATIVE NEGATIVE Final    Comment:        The GeneXpert MRSA Assay (FDA approved for NASAL specimens only), is one component of a comprehensive MRSA colonization surveillance program. It is not intended to diagnose MRSA infection nor to guide or monitor treatment for MRSA infections. Performed at [facility], [address] [place], [place] [address]      Coagulation Studies: Recent Labs    05/21/20 1850 05/22/20 0928  LABPROT 15.6* 16.4*  INR 1.3* 1.4*    Urinalysis: No results for input(s): COLORURINE, LABSPEC, PHURINE, GLUCOSEU, HGBUR, BILIRUBINUR, KETONESUR, PROTEINUR, UROBILINOGEN, NITRITE, LEUKOCYTESUR in the last 72 hours.  Invalid input(s): APPERANCEUR      Imaging: DG Chest Port 1 View  Result Date: 05/21/2020 CLINICAL DATA:  Central line placement. EXAM: PORTABLE CHEST 1 VIEW COMPARISON:  Earlier this day. FINDINGS: Tip of the left internal jugular central venous catheter projects over the mid SVC. No pneumothorax. Endotracheal tube tip remains at the thoracic inlet. Normal heart size with unchanged mediastinal contours. Vascular congestion with chronic interstitial coarsening. Slight increase in left lung base atelectasis. No large pleural effusion. IMPRESSION: 1. Tip of the left internal jugular central venous catheter projects over the mid SVC. No pneumothorax. 2. Slight increase in left lung base atelectasis. Otherwise unchanged appearance of the chest. Electronically Signed   By: Keith Rake M.D.   On: 05/21/2020 20:53   DG Chest Port 1 View  Result Date: 05/21/2020 CLINICAL DATA:  Atrial fibrillation, pancreatitis, intubation EXAM: PORTABLE CHEST 1 VIEW COMPARISON:  Radiograph 02/08/2020 FINDINGS: Endotracheal tube 4.5 cm from the  carina. Telemetry leads overlie the chest. Chronically coarsened interstitial opacities are present throughout the lungs, similar to the comparison. Hypoventilatory changes likely increased prominence of these findings. Mild central vascular congestion without convincing features of edema. No pneumothorax or effusion. The aorta is calcified. The remaining cardiomediastinal contours are unremarkable. No acute osseous or soft tissue abnormality. Degenerative changes are present in the imaged spine and shoulders. IMPRESSION: 1. Endotracheal tube 4.5 cm from the carina. 2. Chronically coarsened interstitial opacities, similar to the comparison. 3. Mild central vascular congestion without convincing features of edema. 4. Atelectasis. Electronically Signed   By: Lovena Le M.D.   On: 05/21/2020 19:09   ECHOCARDIOGRAM COMPLETE  Result Date: 05/22/2020    ECHOCARDIOGRAM REPORT   Patient Name:   [facility]IN [facility] Date of Exam: 05/22/2020 Medical Rec #:  [id number]             Height:  67.0 in Accession #:    [id number]            Weight:       140.9 lb Date of Birth:  05-12-1946             BSA:          1.742 m Patient Age:    66 years              BP:           97/68 mmHg Patient Gender: M                     HR:           62 bpm. Exam Location:  [facility] Procedure: 2D Echo, Cardiac Doppler and Color Doppler Indications:     Shock  History:         Patient has prior history of Echocardiogram examinations, most                  recent 09/23/2016. COPD, Arrythmias:Atrial Flutter; Risk                  Factors:Hypertension, Diabetes and Dyslipidemia.  Sonographer:     Sherrie Sport RDCS (AE) Referring Phys:  [id number] Bradly Bienenstock Diagnosing Phys: Serafina Royals MD  Sonographer Comments: No parasternal window, no apical window and echo performed with patient supine and on artificial respirator. The subcostal was the only view obtainable. and Image acquisition challenging due to COPD. IMPRESSIONS  1. Left ventricular  ejection fraction, by estimation, is 35 to 40%. The left ventricle has moderately decreased function. The left ventricle demonstrates regional wall motion abnormalities (see scoring diagram/findings for description). Left ventricular  diastolic parameters were normal.  2. Right ventricular systolic function is moderately reduced. The right ventricular size is moderately enlarged. Mildly increased right ventricular wall thickness. There is severely elevated pulmonary artery systolic pressure.  3. Left atrial size was mild to moderately dilated.  4. Right atrial size was mild to moderately dilated.  5. The mitral valve is normal in structure. Mild to moderate mitral valve regurgitation.  6. Tricuspid valve regurgitation is moderate.  7. The aortic valve is normal in structure. Aortic valve regurgitation is trivial. FINDINGS  Left Ventricle: Left ventricular ejection fraction, by estimation, is 35 to 40%. The left ventricle has moderately decreased function. The left ventricle demonstrates regional wall motion abnormalities. Moderate hypokinesis of the left ventricular, entire inferoseptal wall. The left ventricular internal cavity size was normal in size. There is no left ventricular hypertrophy. Left ventricular diastolic parameters were normal. Right Ventricle: The right ventricular size is moderately enlarged. Mildly increased right ventricular wall thickness. Right ventricular systolic function is moderately reduced. There is severely elevated pulmonary artery systolic pressure. The tricuspid  regurgitant velocity is 3.80 m/s, and with an assumed right atrial pressure of 10 mmHg, the estimated right ventricular systolic pressure is 74.1 mmHg. Left Atrium: Left atrial size was mild to moderately dilated. Right Atrium: Right atrial size was mild to moderately dilated. Pericardium: There is no evidence of pericardial effusion. Mitral Valve: The mitral valve is normal in structure. Mild to moderate mitral valve  regurgitation. Tricuspid Valve: The tricuspid valve is normal in structure. Tricuspid valve regurgitation is moderate. Aortic Valve: The aortic valve is normal in structure. Aortic valve regurgitation is trivial. Pulmonic Valve: The pulmonic valve was normal in structure. Pulmonic valve regurgitation is mild. Aorta: The aortic root and ascending aorta are structurally  normal, with no evidence of dilitation. IAS/Shunts: No atrial level shunt detected by color flow Doppler.  LEFT VENTRICLE PLAX 2D LVIDd:         4.23 cm LVIDs:         3.60 cm LV PW:         1.36 cm LV IVS:        0.91 cm  LEFT ATRIUM         Index LA diam:    5.10 cm 2.93 cm/m  PULMONIC VALVE PV Vmax:        0.83 m/s PV Peak grad:   2.7 mmHg RVOT Peak grad: 2 mmHg  TRICUSPID VALVE TR Peak grad:   57.8 mmHg TR Vmax:        380.00 cm/s Serafina Royals MD Electronically signed by Serafina Royals MD Signature Date/Time: 05/22/2020/4:43:08 PM    Final      Assessment & Plan: Pt is a 74 y.o.  medical problems of Pancreatic neuroendocrine tumor, chronic pancreatitis, atrial flutter status post ablation, requiring anticoagulation with Eliquis, hypertension, coronary disease, COPD, insulin-dependent type 2 diabetes, CKD stage IIIb,   male with , was admitted on 05/18/2020 with Epigastric pain [R10.13] Abdominal pain [R10.9] Malignant neoplasm of pancreas, unspecified location of malignancy ([place]) [C25.9]  #.  Acute kidney injury on chronic kidney disease stage IV Baseline creatinine 2.6/GFR 27  Urinalysis (July 4) proteinuria 100 mg/dL, 0-5 RBCs, 0-5 WBC Imaging: July 5 CT abdomen: Pancreatitis, pancreatic pseudocysts, bile duct distention, left adrenal mass, mild perinephric stranding on the left.  No hydronephrosis  Acute kidney injury is likely secondary to concurrent illness, sepsis leading to ATN Urine output is low but increasing.  Last 24 hours output of 535 cc Serum creatinine trend is worsening, but rate of rise of creatinine has  slowed  Overall patient has multiple underlying comorbidities and prognosis appears poor.   If aggressive care is desired, he may end up needing renal replacement therapy in the form of CRRT.   Electrolytes and volume status are acceptable.  No acute indication for dialysis today.  #Acute respiratory failure Requiring ventilator support Plan for extubation later today  #Sepsis Klebsiella bacteremia from July 5.   Currently being treated with Zosyn.  Dose adjustment per pharmacy  #Pancreatic neuroendocrine tumor with pseudocyst, worsening biliary duct dilatation,  patient underwent ERCP on July 7 where occluded stent and purulent drainage in the common bile duct He underwent pus drainage from the biliary tree, stent placement in the common bile duct 7/7    LOS: [place] 7/9/20211:22 PM    Note: This note was prepared with Dragon dictation. Any transcription errors are unintentional

## 2020-05-23 NOTE — Consult Note (Signed)
PHARMACY CONSULT NOTE  Pharmacy Consult for Electrolyte Monitoring and Replacement   Recent Labs: Potassium (mmol/L)  Date Value  05/23/2020 4.2  11/01/2014 3.8   Magnesium (mg/dL)  Date Value  05/23/2020 2.0  10/30/2014 1.8   Calcium (mg/dL)  Date Value  05/23/2020 6.7 (L)   Calcium, Total (mg/dL)  Date Value  11/01/2014 9.4   Albumin (g/dL)  Date Value  05/23/2020 1.9 (L)  10/29/2014 3.0 (L)   Phosphorus (mg/dL)  Date Value  05/23/2020 6.7 (H)   Sodium (mmol/L)  Date Value  05/23/2020 137  10/11/2017 140  11/01/2014 138   Corrected Ca: 8.38 mg/dL  Assessment: 74 y.o. male with a history of CKD, COPD, atrial flutter s/p DCCV x 3 and ablation in 2017/2018, anticoagulated with Eliquis, CAD, diabetes, pancreatic cancer, recently diagnosed who presents with epigastric abdominal pain now with E coli and Klebsiella bacteremia.  MIVF: sodium bicarb infusion at 125 mL/hr  Goal of Therapy:  Potassium 4.0 - 5.1 mmol/L Magnesium 2.0 - 2.4 mg/dL All Other Electrolytes WNL  Plan:   No electrolyte replacement warranted today  F/U electrolytes in am 7/10  Dallie Piles ,PharmD Clinical Pharmacist 05/23/2020 7:06 AM

## 2020-05-23 NOTE — Progress Notes (Signed)
[name], MD [facility]   [address], [place], [place] [address] Phone: [phone] Fax : [phone]   Subjective: The patient's liver enzymes continue to remain elevated but his white cell count has improved slightly.  The patient denies any abdominal pain and was able to be extubated.   Objective: Vital signs in last 24 hours: Vitals:   05/23/20 0700 05/23/20 0800 05/23/20 0805 05/23/20 1200  BP: (!) 83/59 (!) 107/56    Pulse: (!) 50     Resp:      Temp:  97.8 F (36.6 C)  (P) 97.8 F (36.6 C)  TempSrc:    (P) Oral  SpO2: 100%  94%   Weight:      Height:       Weight change:   Intake/Output Summary (Last 24 hours) at 05/23/2020 1314 Last data filed at 05/23/2020 1308 Gross per 24 hour  Intake 4522.04 ml  Output 535 ml  Net 3987.04 ml     Exam: Heart:: Regular rate and rhythm, S1S2 present or without murmur or extra heart sounds Lungs: normal and clear to auscultation and percussion Abdomen: soft, nontender, normal bowel sounds   Lab Results: _0 @ Micro Results: Recent Results (from the past 240 hour(s))  SARS Coronavirus 2 by RT PCR (hospital order, performed in [facility] hospital lab) Nasopharyngeal Urine, Clean Catch     Status: None   Collection Time: 05/18/20 11:30 PM   Specimen: Urine, Clean Catch; Nasopharyngeal  Result Value Ref Range Status   SARS Coronavirus 2 NEGATIVE NEGATIVE Final    Comment: (NOTE) SARS-CoV-2 target nucleic acids are NOT DETECTED.  The SARS-CoV-2 RNA is generally detectable in upper and lower respiratory specimens during the acute phase of infection. The lowest concentration of SARS-CoV-2 viral copies this assay can detect is 250 copies / mL. A negative result does not preclude SARS-CoV-2 infection and should not be used as the sole basis for treatment or other patient management decisions.  A negative result may occur with improper specimen collection / handling, submission of specimen other than  nasopharyngeal swab, presence of viral mutation(s) within the areas targeted by this assay, and inadequate number of viral copies (<250 copies / mL). A negative result must be combined with clinical observations, patient history, and epidemiological information.  Fact Sheet for Patients:   StrictlyIdeas.no  Fact Sheet for Healthcare Providers: BankingDealers.co.za  This test is not yet approved or  cleared by the [place] FDA and has been authorized for detection and/or diagnosis of SARS-CoV-2 by FDA under an Emergency Use Authorization (EUA).  This EUA will remain in effect (meaning this test can be used) for the duration of the COVID-19 declaration under Section 564(b)(1) of the Act, 21 U.S.C. section 360bbb-3(b)(1), unless the authorization is terminated or revoked sooner.  Performed at [facility], [address] [place], [place] [address]   CULTURE, BLOOD (ROUTINE X 2) w Reflex to ID Panel     Status: Abnormal   Collection Time: 05/19/20  3:02 PM   Specimen: BLOOD  Result Value Ref Range Status   Specimen Description   Final    BLOOD RIGHT ANTECUBITAL Performed at [facility], [address], [place], [place] [address]    Special Requests   Final    BOTTLES DRAWN AEROBIC AND ANAEROBIC Blood Culture adequate volume Performed at [facility], [address] [place], [place] [address]    Culture  Setup Time   Final    GRAM NEGATIVE RODS  IN BOTH AEROBIC AND ANAEROBIC BOTTLES CRITICAL RESULT CALLED TO, READ BACK BY AND VERIFIED WITH: [facility] [id number] 05/20/20 HNM Performed at [facility] [address], [place], [place] [address]    Culture KLEBSIELLA PNEUMONIAE ESCHERICHIA COLI  (A)  Final   Report Status 05/23/2020 FINAL  Final   Organism ID, Bacteria KLEBSIELLA PNEUMONIAE  Final   Organism ID, Bacteria ESCHERICHIA COLI  Final      Susceptibility   Escherichia coli - MIC*     AMPICILLIN 8 SENSITIVE Sensitive     CEFAZOLIN <=4 SENSITIVE Sensitive     CEFEPIME <=0.12 SENSITIVE Sensitive     CEFTAZIDIME <=1 SENSITIVE Sensitive     CEFTRIAXONE <=0.25 SENSITIVE Sensitive     CIPROFLOXACIN <=0.25 SENSITIVE Sensitive     GENTAMICIN <=1 SENSITIVE Sensitive     IMIPENEM <=0.25 SENSITIVE Sensitive     TRIMETH/SULFA <=20 SENSITIVE Sensitive     AMPICILLIN/SULBACTAM <=2 SENSITIVE Sensitive     PIP/TAZO <=4 SENSITIVE Sensitive     * ESCHERICHIA COLI   Klebsiella pneumoniae - MIC*    AMPICILLIN RESISTANT Resistant     CEFAZOLIN <=4 SENSITIVE Sensitive     CEFEPIME <=0.12 SENSITIVE Sensitive     CEFTAZIDIME <=1 SENSITIVE Sensitive     CEFTRIAXONE <=0.25 SENSITIVE Sensitive     CIPROFLOXACIN <=0.25 SENSITIVE Sensitive     GENTAMICIN <=1 SENSITIVE Sensitive     IMIPENEM <=0.25 SENSITIVE Sensitive     TRIMETH/SULFA <=20 SENSITIVE Sensitive     AMPICILLIN/SULBACTAM 4 SENSITIVE Sensitive     PIP/TAZO <=4 SENSITIVE Sensitive     * KLEBSIELLA PNEUMONIAE  Blood Culture ID Panel (Reflexed)     Status: Abnormal   Collection Time: 05/19/20  3:02 PM  Result Value Ref Range Status   Enterococcus species NOT DETECTED NOT DETECTED Final   Listeria monocytogenes NOT DETECTED NOT DETECTED Final   Staphylococcus species NOT DETECTED NOT DETECTED Final   Staphylococcus aureus (BCID) NOT DETECTED NOT DETECTED Final   Streptococcus species NOT DETECTED NOT DETECTED Final   Streptococcus agalactiae NOT DETECTED NOT DETECTED Final   Streptococcus pneumoniae NOT DETECTED NOT DETECTED Final   Streptococcus pyogenes NOT DETECTED NOT DETECTED Final   Acinetobacter baumannii NOT DETECTED NOT DETECTED Final   Enterobacteriaceae species DETECTED (A) NOT DETECTED Final    Comment: CRITICAL RESULT CALLED TO, READ BACK BY AND VERIFIED WITH:  SCOTT HALL PHARMD [id number] 05/20/20 HNM    Enterobacter cloacae complex NOT DETECTED NOT DETECTED Final   Escherichia coli DETECTED (A) NOT DETECTED Final     Comment: CRITICAL RESULT CALLED TO, READ BACK BY AND VERIFIED WITH: SCOTT HALL PHARMD 0335 05/20/20 HNM    Klebsiella oxytoca NOT DETECTED NOT DETECTED Final   Klebsiella pneumoniae DETECTED (A) NOT DETECTED Final    Comment: CRITICAL RESULT CALLED TO, READ BACK BY AND VERIFIED WITH: SCOTT HALL PHARMD 0335 05/20/20 HNM    Proteus species NOT DETECTED NOT DETECTED Final   Serratia marcescens NOT DETECTED NOT DETECTED Final   Carbapenem resistance NOT DETECTED NOT DETECTED Final   Haemophilus influenzae NOT DETECTED NOT DETECTED Final   Neisseria meningitidis NOT DETECTED NOT DETECTED Final   Pseudomonas aeruginosa NOT DETECTED NOT DETECTED Final   Candida albicans NOT DETECTED NOT DETECTED Final   Candida glabrata NOT DETECTED NOT DETECTED Final   Candida krusei NOT DETECTED NOT DETECTED Final   Candida parapsilosis NOT DETECTED NOT DETECTED Final   Candida tropicalis NOT DETECTED NOT DETECTED Final  Comment: Performed at [facility], [address] [place], [place] [address]  CULTURE, BLOOD (ROUTINE X 2) w Reflex to ID Panel     Status: Abnormal   Collection Time: 05/19/20  3:14 PM   Specimen: BLOOD  Result Value Ref Range Status   Specimen Description   Final    BLOOD BLOOD RIGHT HAND Performed at [facility], [address], [place], [place] [address]    Special Requests   Final    BOTTLES DRAWN AEROBIC AND ANAEROBIC Blood Culture adequate volume Performed at [facility], [address], [place], [place] [address]    Culture  Setup Time   Final    GRAM NEGATIVE RODS IN BOTH AEROBIC AND ANAEROBIC BOTTLES CRITICAL VALUE NOTED.  VALUE IS CONSISTENT WITH PREVIOUSLY REPORTED AND CALLED VALUE. Performed at [facility], [address] [place], [place] [address]    Culture KLEBSIELLA PNEUMONIAE (A)  Final   Report Status 05/22/2020 FINAL  Final   Organism ID, Bacteria KLEBSIELLA PNEUMONIAE  Final      Susceptibility   Klebsiella  pneumoniae - MIC*    AMPICILLIN RESISTANT Resistant     CEFAZOLIN <=4 SENSITIVE Sensitive     CEFEPIME <=0.12 SENSITIVE Sensitive     CEFTAZIDIME <=1 SENSITIVE Sensitive     CEFTRIAXONE <=0.25 SENSITIVE Sensitive     CIPROFLOXACIN <=0.25 SENSITIVE Sensitive     GENTAMICIN <=1 SENSITIVE Sensitive     IMIPENEM <=0.25 SENSITIVE Sensitive     TRIMETH/SULFA <=20 SENSITIVE Sensitive     AMPICILLIN/SULBACTAM 4 SENSITIVE Sensitive     PIP/TAZO <=4 SENSITIVE Sensitive     * KLEBSIELLA PNEUMONIAE  MRSA PCR Screening     Status: None   Collection Time: 05/21/20  5:24 PM   Specimen: Nasopharyngeal  Result Value Ref Range Status   MRSA by PCR NEGATIVE NEGATIVE Final    Comment:        The GeneXpert MRSA Assay (FDA approved for NASAL specimens only), is one component of a comprehensive MRSA colonization surveillance program. It is not intended to diagnose MRSA infection nor to guide or monitor treatment for MRSA infections. Performed at [facility], [address] [place], [place] [address]    Studies/Results: [facility] Chest Port 1 View  Result Date: 05/21/2020 CLINICAL DATA:  Central line placement. EXAM: PORTABLE CHEST 1 VIEW COMPARISON:  Earlier this day. FINDINGS: Tip of the left internal jugular central venous catheter projects over the mid SVC. No pneumothorax. Endotracheal tube tip remains at the thoracic inlet. Normal heart size with unchanged mediastinal contours. Vascular congestion with chronic interstitial coarsening. Slight increase in left lung base atelectasis. No large pleural effusion. IMPRESSION: 1. Tip of the left internal jugular central venous catheter projects over the mid SVC. No pneumothorax. 2. Slight increase in left lung base atelectasis. Otherwise unchanged appearance of the chest. Electronically Signed   By: Keith Rake M.D.   On: 05/21/2020 20:53   DG Chest Port 1 View  Result Date: 05/21/2020 CLINICAL DATA:  Atrial fibrillation, pancreatitis, intubation  EXAM: PORTABLE CHEST 1 VIEW COMPARISON:  Radiograph 02/08/2020 FINDINGS: Endotracheal tube 4.5 cm from the carina. Telemetry leads overlie the chest. Chronically coarsened interstitial opacities are present throughout the lungs, similar to the comparison. Hypoventilatory changes likely increased prominence of these findings. Mild central vascular congestion without convincing features of edema. No pneumothorax or effusion. The aorta is calcified. The remaining cardiomediastinal contours are unremarkable. No acute osseous or soft tissue abnormality. Degenerative changes are  present in the imaged spine and shoulders. IMPRESSION: 1. Endotracheal tube 4.5 cm from the carina. 2. Chronically coarsened interstitial opacities, similar to the comparison. 3. Mild central vascular congestion without convincing features of edema. 4. Atelectasis. Electronically Signed   By: Lovena Le M.D.   On: 05/21/2020 19:09   ECHOCARDIOGRAM COMPLETE  Result Date: 05/22/2020    ECHOCARDIOGRAM REPORT   Patient Name:   RAIMUNDO CORBIT [facility] Date of Exam: 05/22/2020 Medical Rec #:  [id number]             Height:       67.0 in Accession #:    [id number]            Weight:       140.9 lb Date of Birth:  Mar 05, 1946             BSA:          1.742 m Patient Age:    5 years              BP:           97/68 mmHg Patient Gender: M                     HR:           62 bpm. Exam Location:  [facility] Procedure: 2D Echo, Cardiac Doppler and Color Doppler Indications:     Shock  History:         Patient has prior history of Echocardiogram examinations, most                  recent 09/23/2016. COPD, Arrythmias:Atrial Flutter; Risk                  Factors:Hypertension, Diabetes and Dyslipidemia.  Sonographer:     Sherrie Sport RDCS (AE) Referring Phys:  [id number] Bradly Bienenstock Diagnosing Phys: Serafina Royals MD  Sonographer Comments: No parasternal window, no apical window and echo performed with patient supine and on artificial respirator. The subcostal was the  only view obtainable. and Image acquisition challenging due to COPD. IMPRESSIONS  1. Left ventricular ejection fraction, by estimation, is 35 to 40%. The left ventricle has moderately decreased function. The left ventricle demonstrates regional wall motion abnormalities (see scoring diagram/findings for description). Left ventricular  diastolic parameters were normal.  2. Right ventricular systolic function is moderately reduced. The right ventricular size is moderately enlarged. Mildly increased right ventricular wall thickness. There is severely elevated pulmonary artery systolic pressure.  3. Left atrial size was mild to moderately dilated.  4. Right atrial size was mild to moderately dilated.  5. The mitral valve is normal in structure. Mild to moderate mitral valve regurgitation.  6. Tricuspid valve regurgitation is moderate.  7. The aortic valve is normal in structure. Aortic valve regurgitation is trivial. FINDINGS  Left Ventricle: Left ventricular ejection fraction, by estimation, is 35 to 40%. The left ventricle has moderately decreased function. The left ventricle demonstrates regional wall motion abnormalities. Moderate hypokinesis of the left ventricular, entire inferoseptal wall. The left ventricular internal cavity size was normal in size. There is no left ventricular hypertrophy. Left ventricular diastolic parameters were normal. Right Ventricle: The right ventricular size is moderately enlarged. Mildly increased right ventricular wall thickness. Right ventricular systolic function is moderately reduced. There is severely elevated pulmonary artery systolic pressure. The tricuspid  regurgitant velocity is 3.80 m/s, and with an assumed right atrial pressure of 10 mmHg, the estimated right  ventricular systolic pressure is 65.7 mmHg. Left Atrium: Left atrial size was mild to moderately dilated. Right Atrium: Right atrial size was mild to moderately dilated. Pericardium: There is no evidence of pericardial  effusion. Mitral Valve: The mitral valve is normal in structure. Mild to moderate mitral valve regurgitation. Tricuspid Valve: The tricuspid valve is normal in structure. Tricuspid valve regurgitation is moderate. Aortic Valve: The aortic valve is normal in structure. Aortic valve regurgitation is trivial. Pulmonic Valve: The pulmonic valve was normal in structure. Pulmonic valve regurgitation is mild. Aorta: The aortic root and ascending aorta are structurally normal, with no evidence of dilitation. IAS/Shunts: No atrial level shunt detected by color flow Doppler.  LEFT VENTRICLE PLAX 2D LVIDd:         4.23 cm LVIDs:         3.60 cm LV PW:         1.36 cm LV IVS:        0.91 cm  LEFT ATRIUM         Index LA diam:    5.10 cm 2.93 cm/m  PULMONIC VALVE PV Vmax:        0.83 m/s PV Peak grad:   2.7 mmHg RVOT Peak grad: 2 mmHg  TRICUSPID VALVE TR Peak grad:   57.8 mmHg TR Vmax:        380.00 cm/s Serafina Royals MD Electronically signed by Serafina Royals MD Signature Date/Time: 05/22/2020/4:43:08 PM    Final    Medications: I have reviewed the patient's current medications. Scheduled Meds: . chlorhexidine gluconate (MEDLINE KIT)  15 mL Mouth Rinse BID  . Chlorhexidine Gluconate Cloth  6 each Topical Daily  . hydrocortisone sod succinate (SOLU-CORTEF) inj  50 mg Intravenous Q6H  . insulin aspart  0-9 Units Subcutaneous Q4H  . insulin glargine  12 Units Subcutaneous QHS  . levothyroxine  25 mcg Oral q morning - 10a  . mouth rinse  15 mL Mouth Rinse 10 times per day  . midodrine  10 mg Oral TID  . mometasone-formoterol  2 puff Inhalation BID  . pantoprazole (PROTONIX) IV  40 mg Intravenous Q24H  . vitamin B-12  500 mcg Oral Daily   Continuous Infusions: . sodium chloride Stopped (05/20/20 0919)  . sodium chloride    . albumin human Stopped (05/23/20 1200)  . norepinephrine (LEVOPHED) Adult infusion Stopped (05/23/20 0521)  . phenylephrine (NEO-SYNEPHRINE) Adult infusion    . piperacillin-tazobactam  (ZOSYN)  IV 3.375 g (05/23/20 1253)  .  sodium bicarbonate (isotonic) infusion in sterile water 50 mL/hr at 05/23/20 1126  . vasopressin 0.03 Units/min (05/23/20 0735)   PRN Meds:.sodium chloride, acetaminophen, fentaNYL, midazolam, morphine injection, ondansetron (ZOFRAN) IV   Assessment: Principal Problem:   Acute cholangitis Active Problems:   Typical atrial flutter (HCC)   Essential hypertension   Chronic obstructive pulmonary disease (HCC)   Diabetes type 2, uncontrolled (HCC)   CAD (coronary artery disease)   Abdominal pain   Acute kidney injury superimposed on chronic kidney disease (HCC)   Transaminitis   Neoplasm related pain   Primary pancreatic neuroendocrine tumor   Protein-calorie malnutrition, severe   Sepsis ([place])   Bacteremia   Malignant neoplasm of pancreas ([place])    Plan: This patient has multiple medical problems including metastatic cancer and 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  The patient had 2 stents placed in his common bile duct and a infected stent taken out.  The patient had purulent material coming  from his bile duct prior to the stent exchange.  The patient's white cell count has come down indicating drainage of his biliary system although his liver enzymes have not improved.  This is likely due to hepatocellular inflammation from his sepsis and will hopefully improve with time.   LOS: 4 days   [name] 05/23/2020, 1:14 PM Pager [phone] 7am-5pm  Check AMION for 5pm -7am coverage and on weekends

## 2020-05-23 NOTE — Consult Note (Signed)
CRITICAL CARE PROGRESS NOTE    Name: Rajohn Henery MRN: [id number] DOB: 04-02-1946     LOS: 4   SUBJECTIVE FINDINGS & SIGNIFICANT EVENTS    Patient description:  Mr. Karen is a 74 year old male with a past medical history significant for atrial flutter s/p DCCV x 3 and ablation in 2017/2018, anticoagulated with Eliquis, coronary artery disease detected by chest CT in 2020, HFpEF, oxygen dependent COPD, hypothyroidism, type 2 diabetes, hypertension, and recently diagnosed pancreatic cancer who presented to the ED on 05/18/20 for epigastric pain with associated nausea and vomiting. Abdominal CT revealed pancreatitis with a presumed pseudocyst with worsening of the biliary ductal dilatation.  Blood cultures were positive for  polymicrobial bacteremia.  Blood cultures having both Klebsiella and E. 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  Course complicated by renal failure and initial dialysis need, GI bleed and pneumonia leading to septic shock.  Subsequently improved and off dialysis.  ERCP performed on 3/30 showed severe localized biliary stricture and underwent sphincterotomy with balloon sphincteroplasty.  He also had a biliary stent placement.  He also had a pancreatic duct stent placement he completed course of Zosyn post procedure.  The brushings were positive for tumor . Post ERCP patient was unable to be extubated and PCCM consult placed for further evaluation and mgt. On arrival patient is in [place].  He received esmolol prior to arrival without imrovement in HR which was >150. He then received 5lopressor and amiodarone gtt for rate control    05/22/20- patient remains on multiple vasopressors for septic and obstructive shock with multiorgan failure syndrome.   He is jaundiced with shock liver and anuric renal failure.  I have discussed his case with surgery team and at this time there are no surgical options for patient due to severe instability.   I met with 3 nieces today including POA.  We reviewed his medical history including lifelong smoking with advanced COPD FEV148% in 2017, complex cardiac hx with CHF and AFL s/p ablation x2, CKD3, PVD, neuroendocrine cancer well differentiated with metastassis and pancreatic cancer with biliary obstruction s/p ERCP with stenting and restestenting and now obstructive jaundice with shock.  They had numerous questions which we discussed.  POA states that son is not the decision maker for health care and they have advance directive in paper documentation. They wish to proceed with comfort measures due to grim prognosis long term.  POA has also asked to wait until tommorow so they can inform son and see if he wishes to come in person to see patient. We will accomodate family request.      05/23/20-After aggressive rescucitative efforts patient was weaned off pressors overnight and weaned down on mechanical ventilation with successful liberation from ventilator today. Still sedated due to CKD with recirculation of drugs/metabolites.   Lines/tubes : Airway 7 mm (Active)  Secured at (cm) 22 cm 05/21/20 1645  Measured From Lips 05/21/20 [place] 05/21/20 1645  Secured By [employer] 05/21/20 1645  Cuff Pressure (cm H2O) 26 cm H2O 05/21/20 1645  Site Condition Cool;Dry 05/21/20 1645    Microbiology/Sepsis markers: Results for orders placed or performed during the hospital encounter of 05/18/20  SARS Coronavirus 2 by RT PCR (hospital order, performed in [facility] hospital lab) Nasopharyngeal Urine, Clean Catch     Status: None   Collection Time: 05/18/20 11:30 PM   Specimen: Urine, Clean Catch; Nasopharyngeal  Result  Value Ref Range Status   SARS Coronavirus 2 NEGATIVE NEGATIVE Final      Comment: (NOTE) SARS-CoV-2 target nucleic acids are NOT DETECTED.  The SARS-CoV-2 RNA is generally detectable in upper and lower respiratory specimens during the acute phase of infection. The lowest concentration of SARS-CoV-2 viral copies this assay can detect is 250 copies / mL. A negative result does not preclude SARS-CoV-2 infection and should not be used as the sole basis for treatment or other patient management decisions.  A negative result may occur with improper specimen collection / handling, submission of specimen other than nasopharyngeal swab, presence of viral mutation(s) within the areas targeted by this assay, and inadequate number of viral copies (<250 copies / mL). A negative result must be combined with clinical observations, patient history, and epidemiological information.  Fact Sheet for Patients:   StrictlyIdeas.no  Fact Sheet for Healthcare Providers: BankingDealers.co.za  This test is not yet approved or  cleared by the [place] FDA and has been authorized for detection and/or diagnosis of SARS-CoV-2 by FDA under an Emergency Use Authorization (EUA).  This EUA will remain in effect (meaning this test can be used) for the duration of the COVID-19 declaration under Section 564(b)(1) of the Act, 21 U.S.C. section 360bbb-3(b)(1), unless the authorization is terminated or revoked sooner.  Performed at [facility], [address] [place], [place] [address]   CULTURE, BLOOD (ROUTINE X 2) w Reflex to ID Panel     Status: Abnormal   Collection Time: 05/19/20  3:02 PM   Specimen: BLOOD  Result Value Ref Range Status   Specimen Description   Final    BLOOD RIGHT ANTECUBITAL Performed at [facility], [address] [place], [place] [address]    Special Requests   Final    BOTTLES DRAWN AEROBIC AND ANAEROBIC Blood Culture adequate volume Performed at [facility], [address] [place], [place] [address]    Culture  Setup Time   Final    GRAM NEGATIVE RODS IN BOTH AEROBIC AND ANAEROBIC BOTTLES CRITICAL RESULT CALLED TO, READ BACK BY AND VERIFIED WITH: Hart [facility] [id number] 05/20/20 HNM Performed at [facility], [place] [address], [place], [place] [address]    Culture KLEBSIELLA PNEUMONIAE ESCHERICHIA COLI  (A)  Final   Report Status 05/23/2020 FINAL  Final   Organism ID, Bacteria KLEBSIELLA PNEUMONIAE  Final   Organism ID, Bacteria ESCHERICHIA COLI  Final      Susceptibility   Escherichia coli - MIC*    AMPICILLIN 8 SENSITIVE Sensitive     CEFAZOLIN <=4 SENSITIVE Sensitive     CEFEPIME <=0.12 SENSITIVE Sensitive     CEFTAZIDIME <=1 SENSITIVE Sensitive     CEFTRIAXONE <=0.25 SENSITIVE Sensitive     CIPROFLOXACIN <=0.25 SENSITIVE Sensitive     GENTAMICIN <=1 SENSITIVE Sensitive     IMIPENEM <=0.25 SENSITIVE Sensitive     TRIMETH/SULFA <=20 SENSITIVE Sensitive     AMPICILLIN/SULBACTAM <=2 SENSITIVE Sensitive     PIP/TAZO <=4 SENSITIVE Sensitive     * ESCHERICHIA COLI   Klebsiella pneumoniae - MIC*    AMPICILLIN RESISTANT Resistant     CEFAZOLIN <=4 SENSITIVE Sensitive     CEFEPIME <=0.12 SENSITIVE Sensitive     CEFTAZIDIME <=1 SENSITIVE Sensitive     CEFTRIAXONE <=0.25 SENSITIVE Sensitive     CIPROFLOXACIN <=0.25 SENSITIVE Sensitive     GENTAMICIN <=1 SENSITIVE Sensitive     IMIPENEM <=0.25 SENSITIVE Sensitive     TRIMETH/SULFA <=  20 SENSITIVE Sensitive     AMPICILLIN/SULBACTAM 4 SENSITIVE Sensitive     PIP/TAZO <=4 SENSITIVE Sensitive     * KLEBSIELLA PNEUMONIAE  Blood Culture ID Panel (Reflexed)     Status: Abnormal   Collection Time: 05/19/20  3:02 PM  Result Value Ref Range Status   Enterococcus species NOT DETECTED NOT DETECTED Final   Listeria monocytogenes NOT DETECTED NOT DETECTED Final   Staphylococcus species NOT DETECTED NOT DETECTED Final   Staphylococcus aureus (BCID) NOT DETECTED NOT DETECTED Final   Streptococcus species  NOT DETECTED NOT DETECTED Final   Streptococcus agalactiae NOT DETECTED NOT DETECTED Final   Streptococcus pneumoniae NOT DETECTED NOT DETECTED Final   Streptococcus pyogenes NOT DETECTED NOT DETECTED Final   Acinetobacter baumannii NOT DETECTED NOT DETECTED Final   Enterobacteriaceae species DETECTED (A) NOT DETECTED Final    Comment: CRITICAL RESULT CALLED TO, READ BACK BY AND VERIFIED WITH:  SCOTT HALL PHARMD [id number] 05/20/20 HNM    Enterobacter cloacae complex NOT DETECTED NOT DETECTED Final   Escherichia coli DETECTED (A) NOT DETECTED Final    Comment: CRITICAL RESULT CALLED TO, READ BACK BY AND VERIFIED WITH: SCOTT HALL PHARMD 0335 05/20/20 HNM    Klebsiella oxytoca NOT DETECTED NOT DETECTED Final   Klebsiella pneumoniae DETECTED (A) NOT DETECTED Final    Comment: CRITICAL RESULT CALLED TO, READ BACK BY AND VERIFIED WITH: SCOTT HALL PHARMD 0335 05/20/20 HNM    Proteus species NOT DETECTED NOT DETECTED Final   Serratia marcescens NOT DETECTED NOT DETECTED Final   Carbapenem resistance NOT DETECTED NOT DETECTED Final   Haemophilus influenzae NOT DETECTED NOT DETECTED Final   Neisseria meningitidis NOT DETECTED NOT DETECTED Final   Pseudomonas aeruginosa NOT DETECTED NOT DETECTED Final   Candida albicans NOT DETECTED NOT DETECTED Final   Candida glabrata NOT DETECTED NOT DETECTED Final   Candida krusei NOT DETECTED NOT DETECTED Final   Candida parapsilosis NOT DETECTED NOT DETECTED Final   Candida tropicalis NOT DETECTED NOT DETECTED Final    Comment: Performed at [facility], [address] [place], [place] [address]  CULTURE, BLOOD (ROUTINE X 2) w Reflex to ID Panel     Status: Abnormal   Collection Time: 05/19/20  3:14 PM   Specimen: BLOOD  Result Value Ref Range Status   Specimen Description   Final    BLOOD BLOOD RIGHT HAND Performed at [facility], [address], [place], [place] [address]    Special Requests   Final    BOTTLES DRAWN AEROBIC AND  ANAEROBIC Blood Culture adequate volume Performed at [facility], [address] [place], [place] [address]    Culture  Setup Time   Final    GRAM NEGATIVE RODS IN BOTH AEROBIC AND ANAEROBIC BOTTLES CRITICAL VALUE NOTED.  VALUE IS CONSISTENT WITH PREVIOUSLY REPORTED AND CALLED VALUE. Performed at [facility], [address] [place], [place] [address]    Culture KLEBSIELLA PNEUMONIAE (A)  Final   Report Status 05/22/2020 FINAL  Final   Organism ID, Bacteria KLEBSIELLA PNEUMONIAE  Final      Susceptibility   Klebsiella pneumoniae - MIC*    AMPICILLIN RESISTANT Resistant     CEFAZOLIN <=4 SENSITIVE Sensitive     CEFEPIME <=0.12 SENSITIVE Sensitive     CEFTAZIDIME <=1 SENSITIVE Sensitive     CEFTRIAXONE <=0.25 SENSITIVE Sensitive     CIPROFLOXACIN <=0.25 SENSITIVE Sensitive     GENTAMICIN <=1 SENSITIVE Sensitive     IMIPENEM <=0.25 SENSITIVE  Sensitive     TRIMETH/SULFA <=20 SENSITIVE Sensitive     AMPICILLIN/SULBACTAM 4 SENSITIVE Sensitive     PIP/TAZO <=4 SENSITIVE Sensitive     * KLEBSIELLA PNEUMONIAE  MRSA PCR Screening     Status: None   Collection Time: 05/21/20  5:24 PM   Specimen: Nasopharyngeal  Result Value Ref Range Status   MRSA by PCR NEGATIVE NEGATIVE Final    Comment:        The GeneXpert MRSA Assay (FDA approved for NASAL specimens only), is one component of a comprehensive MRSA colonization surveillance program. It is not intended to diagnose MRSA infection nor to guide or monitor treatment for MRSA infections. Performed at [facility], [address], [place], [place] [address]     Anti-infectives:  Anti-infectives (From admission, onward)   Start     Dose/Rate Route Frequency Ordered Stop   05/23/20 1300  piperacillin-tazobactam (ZOSYN) IVPB 3.375 g     Discontinue     3.375 g 12.5 mL/hr over 240 Minutes Intravenous Every 12 hours 05/23/20 1122     05/20/20 0600  meropenem (MERREM) 1 g in sodium chloride 0.9 % 100 mL  IVPB  Status:  Discontinued        1 g 200 mL/hr over 30 Minutes Intravenous Every 12 hours 05/20/20 0344 05/23/20 1122       Consults: Treatment Team:  [name], MD Teodoro Spray, MD Ottie Glazier, MD Murlean Iba, MD Herbert Pun, MD       PAST MEDICAL HISTORY   Past Medical History:  Diagnosis Date  . Aortic atherosclerosis ([place])    As seen on 04/2019 CT  . Atrial flutter ([place])    a. s/p ablation 2017/2018 and DCCV 2015/2016/2019 b. 2017 echo EF 60-65%, no RWMA, mild MR,  . CAD (coronary artery disease)    3v CAD 04/2019 CT  . Carcinoid tumor    metastatic  . CKD (chronic kidney disease), stage III   . COPD (chronic obstructive pulmonary disease) (H[facility])    PM oxygen  . Current occasional smoker    1-2 cigarettes / week  . Diabetes mellitus type 2, insulin dependent ([place])   . Heart failure with preserved ejection fraction ([place])    2017 echo EF 60-65%, no RWMA, mild MR,    . Hyperlipidemia   . Hypertension   . Pancreatitis   . Polycythemia      SURGICAL HISTORY   Past Surgical History:  Procedure Laterality Date  . A-FLUTTER ABLATION N/A 10/14/2017   Procedure: A-FLUTTER ABLATION;  Surgeon: Constance Haw, MD;  Location: [facility];  Service: Cardiovascular;  Laterality: N/A;  . CARDIOVERSION N/A 09/08/2017   Procedure: CARDIOVERSION;  Surgeon: [name], MD;  Location: [facility] ORS;  Service: Cardiovascular;  Laterality: N/A;  . CARDIOVERSION N/A 03/22/2018   Procedure: CARDIOVERSION;  Surgeon: [name], MD;  Location: [facility] ORS;  Service: Cardiovascular;  Laterality: N/A;  . COLONOSCOPY    . COLONOSCOPY WITH PROPOFOL N/A 10/31/2019   Procedure: COLONOSCOPY WITH PROPOFOL;  Surgeon: Lin Landsman, MD;  Location: [facility] ENDOSCOPY;  Service: Gastroenterology;  Laterality: N/A;  . ELECTROPHYSIOLOGIC STUDY N/A 10/27/2016   Procedure: A-Flutter Ablation;  Surgeon: Deboraha Sprang, MD;  Location: [place];   Service: Cardiovascular;  Laterality: N/A;  . ENDOSCOPIC RETROGRADE CHOLANGIOPANCREATOGRAPHY (ERCP) WITH PROPOFOL N/A 05/21/2020   Procedure: ENDOSCOPIC RETROGRADE CHOLANGIOPANCREATOGRAPHY (ERCP) WITH PROPOFOL;  Surgeon: [name], MD;  Location: [facility] ENDOSCOPY;  Service: Endoscopy;  Laterality: N/A;  . EUS N/A 12/22/2017   Procedure: FULL UPPER ENDOSCOPIC ULTRASOUND (EUS) RADIAL;  Surgeon: Reita Cliche, MD;  Location: [facility] ENDOSCOPY;  Service: Gastroenterology;  Laterality: N/A;  . TEE WITH CARDIOVERSION  12/16  . TEMPORARY DIALYSIS CATHETER N/A 02/08/2020   Procedure: TEMPORARY DIALYSIS CATHETER;  Surgeon: Algernon Huxley, MD;  Location: [facility];  Service: Cardiovascular;  Laterality: N/A;     FAMILY HISTORY   Family History  Problem Relation Age of Onset  . Hypertension Father      SOCIAL HISTORY   Social History   Tobacco Use  . Smoking status: Light Tobacco Smoker    Years: 45.00    Types: Cigarettes    Last attempt to quit: 11/27/2012    Years since quitting: 7.4  . Smokeless tobacco: Never Used  Vaping Use  . Vaping Use: Never used  Substance Use Topics  . Alcohol use: Yes    Alcohol/week: 2.0 standard drinks    Types: 2 Cans of beer per week    Comment: beer every once a while once a monthnone last 24hrs  . Drug use: No     MEDICATIONS   Current Medication:  Current Facility-Administered Medications:  .  0.9 %  sodium chloride infusion, , Intravenous, PRN, Swayze, Ava, DO, Stopped at 05/20/20 0919 .  0.9 %  sodium chloride infusion, 250 mL, Intravenous, Continuous, Promiss Labarbera, MD .  acetaminophen (TYLENOL) tablet 1,000 mg, 1,000 mg, Oral, Q6H PRN, Sharion Settler, NP, 1,000 mg at 05/20/20 0000 .  albumin human 25 % solution 12.5 g, 12.5 g, Intravenous, Daily, Ottie Glazier, MD, Stopped at 05/23/20 1200 .  chlorhexidine gluconate (MEDLINE KIT) (PERIDEX) 0.12 % solution 15 mL, 15 mL, Mouth Rinse, BID, Lanney Gins, Treniece Holsclaw, MD, 15 mL at 05/23/20  0814 .  Chlorhexidine Gluconate Cloth 2 % PADS 6 each, 6 each, Topical, Daily, Ottie Glazier, MD, 6 each at 05/22/20 2210 .  fentaNYL (SUBLIMAZE) bolus via infusion 50 mcg, 50 mcg, Intravenous, Q15 min PRN, Ottie Glazier, MD, 50 mcg at 05/23/20 0739 .  hydrocortisone sodium succinate (SOLU-CORTEF) 100 MG injection 50 mg, 50 mg, Intravenous, Q6H, Darel Hong D, NP, 50 mg at 05/23/20 7614 .  insulin aspart (novoLOG) injection 0-9 Units, 0-9 Units, Subcutaneous, Q4H, Darel Hong D, NP, 1 Units at 05/23/20 0415 .  insulin glargine (LANTUS) injection 12 Units, 12 Units, Subcutaneous, QHS, Tu, Ching T, DO, 12 Units at 05/22/20 2209 .  levothyroxine (SYNTHROID) tablet 25 mcg, 25 mcg, Oral, q morning - 10a, Tu, Ching T, DO, 25 mcg at 05/21/20 0615 .  MEDLINE mouth rinse, 15 mL, Mouth Rinse, 10 times per day, Ottie Glazier, MD, 15 mL at 05/23/20 1242 .  midazolam (VERSED) injection 2 mg, 2 mg, Intravenous, Q15 min PRN, Ottie Glazier, MD, 2 mg at 05/23/20 0123 .  midodrine (PROAMATINE) tablet 10 mg, 10 mg, Oral, TID, Tu, Ching T, DO, 10 mg at 05/21/20 1020 .  mometasone-formoterol (DULERA) 100-5 MCG/ACT inhaler 2 puff, 2 puff, Inhalation, BID, Tu, Ching T, DO, 2 puff at 05/21/20 0859 .  morphine 2 MG/ML injection 1 mg, 1 mg, Intravenous, Q2H PRN, Tu, Ching T, DO, 1 mg at 05/20/20 1613 .  norepinephrine (LEVOPHED) 16 mg in 23m premix infusion, 0-40 mcg/min, Intravenous, Titrated, KBradly Bienenstock NP, Stopped at 05/23/20 [phone].  ondansetron (ZOFRAN) injection 4 mg, 4 mg, Intravenous, Q6H PRN, Tu, Ching T, DO, 4 mg at 05/21/20 1252 .  pantoprazole (PROTONIX) injection 40 mg,  40 mg, Intravenous, Q24H, Dallie Piles, RPH, 40 mg at 05/23/20 1252 .  phenylephrine CONCENTRATED 141m in sodium chloride 0.9% 2547m(0.66m43mL) infusion, 0-400 mcg/min, Intravenous, Titrated, Zara Wendt, MD .  piperacillin-tazobactam (ZOSYN) IVPB 3.375 g, 3.375 g, Intravenous, Q12H, Ravishankar, Jayashree, MD,  Last Rate: 12.5 mL/hr at 05/23/20 1253, 3.375 g at 05/23/20 1253 .  sodium bicarbonate 150 mEq in sterile water 1,000 mL infusion, , Intravenous, Continuous, Shaylene Paganelli, MD, Last Rate: 50 mL/hr at 05/23/20 1126, Rate Change at 05/23/20 1126 .  vasopressin (PITRESSIN) 20 Units in sodium chloride 0.9 % 100 mL infusion-*FOR SHOCK*, 0-0.03 Units/min, Intravenous, Continuous, KeeDarel Hong NP, Last Rate: 9 mL/hr at 05/23/20 0735, 0.03 Units/min at 05/23/20 0735 .  vitamin B-12 (CYANOCOBALAMIN) tablet 500 mcg, 500 mcg, Oral, Daily, Tu, Ching T, DO, 500 mcg at 05/22/20 1031    ALLERGIES   Levaquin [levofloxacin in d5w]    REVIEW OF SYSTEMS     Unable to obtain due to mechanical intubation   PHYSICAL EXAMINATION   Vital Signs: Temp:  [97.7 F (36.5 C)-98 F (36.7 C)] 97.8 F (36.6 C) (07/09 1200) Pulse Rate:  [50-98] 50 (07/09 0700) BP: (83-123)/(56-109) 107/56 (07/09 0800) SpO2:  [94 %-100 %] 94 % (07/09 0805) Arterial Line BP: (94-142)/(54-88) 120/62 (07/09 0800) FiO2 (%):  [28 %-35 %] 28 % (07/09 0805)  GENERAL:chronically ill appearing on [place] HEAD: Normocephalic, atraumatic.  EYES: Pupils equal, round, reactive to light.  No scleral icterus.  MOUTH: Moist mucosal membrane. NECK: Supple. No thyromegaly. No nodules. No JVD.  PULMONARY: bilateral mild rhonchi CARDIOVASCULAR: S1 and S2. Regular rate and rhythm. No murmurs, rubs, or gallops.  GASTROINTESTINAL: Soft, nontender, non-distended. No masses. Positive bowel sounds. No hepatosplenomegaly.  MUSCULOSKELETAL: No swelling, clubbing, or edema.  NEUROLOGIC: GCS11 SKIN:intact,warm,dry   PERTINENT DATA     Infusions: . sodium chloride Stopped (05/20/20 0919)  . sodium chloride    . albumin human Stopped (05/23/20 1200)  . norepinephrine (LEVOPHED) Adult infusion Stopped (05/23/20 0521)  . phenylephrine (NEO-SYNEPHRINE) Adult infusion    . piperacillin-tazobactam (ZOSYN)  IV 3.375 g (05/23/20 1253)  .  sodium  bicarbonate (isotonic) infusion in sterile water 50 mL/hr at 05/23/20 1126  . vasopressin 0.03 Units/min (05/23/20 0735)   Scheduled Medications: . chlorhexidine gluconate (MEDLINE KIT)  15 mL Mouth Rinse BID  . Chlorhexidine Gluconate Cloth  6 each Topical Daily  . hydrocortisone sod succinate (SOLU-CORTEF) inj  50 mg Intravenous Q6H  . insulin aspart  0-9 Units Subcutaneous Q4H  . insulin glargine  12 Units Subcutaneous QHS  . levothyroxine  25 mcg Oral q morning - 10a  . mouth rinse  15 mL Mouth Rinse 10 times per day  . midodrine  10 mg Oral TID  . mometasone-formoterol  2 puff Inhalation BID  . pantoprazole (PROTONIX) IV  40 mg Intravenous Q24H  . vitamin B-12  500 mcg Oral Daily   PRN Medications: sodium chloride, acetaminophen, fentaNYL, midazolam, morphine injection, ondansetron (ZOFRAN) IV Hemodynamic parameters: CVP:  [9 mmHg-14 mmHg] 11 mmHg Intake/Output: 07/08 0701 - 07/09 0700 In: [id number].V.:4322; IV Piggyback:350] Out: 535403rine:535]  Ventilator  Settings: Vent Mode: PSV FiO2 (%):  [28 %-35 %] 28 % Set Rate:  [18 bmp] 18 bmp Vt Set:  [550 mL] 550 mL PEEP:  [5 cmH20] 5 cmH20 Pressure Support:  [5 cmH20] 5 cmH20    LAB RESULTS:  Basic Metabolic Panel: Recent Labs  Lab 05/21/20 0741 05/21/20 0741 05/21/20 1937 05/21/20 1937  05/22/20 0526 05/22/20 0526 05/22/20 0928 05/23/20 0416  NA 133*  --  134*  --  137  --  134* 137  K 4.7   < > 4.8   < > 4.8   < > 4.6 4.2  CL 109  --  108  --  106  --  104 101  CO2 18*  --  15*  --  20*  --  18* 25  GLUCOSE 169*  --  125*  --  157*  --  159* 121*  BUN 63*  --  67*  --  70*  --  72* 79*  CREATININE 3.86*  --  4.32*  --  4.00*  --  4.04* 4.29*  CALCIUM 7.6*  --  7.6*  --  6.9*  --  6.8* 6.7*  MG  --   --   --   --  1.6*  --   --  2.0  PHOS  --   --   --   --   --   --   --  6.7*   < > = values in this interval not displayed.   Liver Function Tests: Recent Labs  Lab 05/19/20 0833 05/20/20 0439  05/21/20 0741 05/22/20 0526 05/23/20 0416  AST 99* 94* 186* 357* 536*  ALT 149* 130* 161* 231* 298*  ALKPHOS 757* 591* 650* 798* 889*  BILITOT 5.9* 6.1* 8.2* 9.1* 9.7*  PROT 6.0* 4.5* 4.5* 4.5* 4.3*  ALBUMIN 2.8* 2.0* 2.0* 1.9* 1.9*   Recent Labs  Lab 05/18/20 1747 05/19/20 1502 05/22/20 1036  LIPASE 100* 19 22   No results for input(s): AMMONIA in the last 168 hours. CBC: Recent Labs  Lab 05/20/20 0439 05/21/20 0741 05/21/20 1850 05/22/20 0526 05/23/20 0416  WBC 20.2* 25.8* 46.9* 21.4* 18.7*  NEUTROABS  --  19.6*  --  19.1* 15.7*  HGB 10.0* 8.8* 11.0* 8.5* 8.1*  HCT 30.5* 27.3* 33.4* 25.9* 25.0*  MCV 91.0 92.9 91.3 91.8 91.2  PLT 49* 52* 81* 66* 68*   Cardiac Enzymes: No results for input(s): CKTOTAL, CKMB, CKMBINDEX, TROPONINI in the last 168 hours. BNP: Invalid input(s): POCBNP CBG: Recent Labs  Lab 05/22/20 2339 05/23/20 0413 05/23/20 0747 05/23/20 0750 05/23/20 1221  GLUCAP 133* 131* 100* 105* 106*       IMAGING RESULTS:  Imaging: DG Chest Port 1 View  Result Date: 05/21/2020 CLINICAL DATA:  Central line placement. EXAM: PORTABLE CHEST 1 VIEW COMPARISON:  Earlier this day. FINDINGS: Tip of the left internal jugular central venous catheter projects over the mid SVC. No pneumothorax. Endotracheal tube tip remains at the thoracic inlet. Normal heart size with unchanged mediastinal contours. Vascular congestion with chronic interstitial coarsening. Slight increase in left lung base atelectasis. No large pleural effusion. IMPRESSION: 1. Tip of the left internal jugular central venous catheter projects over the mid SVC. No pneumothorax. 2. Slight increase in left lung base atelectasis. Otherwise unchanged appearance of the chest. Electronically Signed   By: Keith Rake M.D.   On: 05/21/2020 20:53   DG Chest Port 1 View  Result Date: 05/21/2020 CLINICAL DATA:  Atrial fibrillation, pancreatitis, intubation EXAM: PORTABLE CHEST 1 VIEW COMPARISON:   Radiograph 02/08/2020 FINDINGS: Endotracheal tube 4.5 cm from the carina. Telemetry leads overlie the chest. Chronically coarsened interstitial opacities are present throughout the lungs, similar to the comparison. Hypoventilatory changes likely increased prominence of these findings. Mild central vascular congestion without convincing features of edema. No pneumothorax or effusion. The aorta is calcified.  The remaining cardiomediastinal contours are unremarkable. No acute osseous or soft tissue abnormality. Degenerative changes are present in the imaged spine and shoulders. IMPRESSION: 1. Endotracheal tube 4.5 cm from the carina. 2. Chronically coarsened interstitial opacities, similar to the comparison. 3. Mild central vascular congestion without convincing features of edema. 4. Atelectasis. Electronically Signed   By: Lovena Le M.D.   On: 05/21/2020 19:09   ECHOCARDIOGRAM COMPLETE  Result Date: 05/22/2020    ECHOCARDIOGRAM REPORT   Patient Name:   DABNEY SCHANZ [facility] Date of Exam: 05/22/2020 Medical Rec #:  [id number]             Height:       67.0 in Accession #:    [id number]            Weight:       140.9 lb Date of Birth:  05-07-1946             BSA:          1.742 m Patient Age:    32 years              BP:           97/68 mmHg Patient Gender: M                     HR:           62 bpm. Exam Location:  [facility] Procedure: 2D Echo, Cardiac Doppler and Color Doppler Indications:     Shock  History:         Patient has prior history of Echocardiogram examinations, most                  recent 09/23/2016. COPD, Arrythmias:Atrial Flutter; Risk                  Factors:Hypertension, Diabetes and Dyslipidemia.  Sonographer:     Sherrie Sport RDCS (AE) Referring Phys:  [id number] Bradly Bienenstock Diagnosing Phys: Serafina Royals MD  Sonographer Comments: No parasternal window, no apical window and echo performed with patient supine and on artificial respirator. The subcostal was the only view obtainable. and Image  acquisition challenging due to COPD. IMPRESSIONS  1. Left ventricular ejection fraction, by estimation, is 35 to 40%. The left ventricle has moderately decreased function. The left ventricle demonstrates regional wall motion abnormalities (see scoring diagram/findings for description). Left ventricular  diastolic parameters were normal.  2. Right ventricular systolic function is moderately reduced. The right ventricular size is moderately enlarged. Mildly increased right ventricular wall thickness. There is severely elevated pulmonary artery systolic pressure.  3. Left atrial size was mild to moderately dilated.  4. Right atrial size was mild to moderately dilated.  5. The mitral valve is normal in structure. Mild to moderate mitral valve regurgitation.  6. Tricuspid valve regurgitation is moderate.  7. The aortic valve is normal in structure. Aortic valve regurgitation is trivial. FINDINGS  Left Ventricle: Left ventricular ejection fraction, by estimation, is 35 to 40%. The left ventricle has moderately decreased function. The left ventricle demonstrates regional wall motion abnormalities. Moderate hypokinesis of the left ventricular, entire inferoseptal wall. The left ventricular internal cavity size was normal in size. There is no left ventricular hypertrophy. Left ventricular diastolic parameters were normal. Right Ventricle: The right ventricular size is moderately enlarged. Mildly increased right ventricular wall thickness. Right ventricular systolic function is moderately reduced. There is severely elevated pulmonary artery systolic pressure. The tricuspid  regurgitant velocity  is 3.80 m/s, and with an assumed right atrial pressure of 10 mmHg, the estimated right ventricular systolic pressure is 62.9 mmHg. Left Atrium: Left atrial size was mild to moderately dilated. Right Atrium: Right atrial size was mild to moderately dilated. Pericardium: There is no evidence of pericardial effusion. Mitral Valve: The  mitral valve is normal in structure. Mild to moderate mitral valve regurgitation. Tricuspid Valve: The tricuspid valve is normal in structure. Tricuspid valve regurgitation is moderate. Aortic Valve: The aortic valve is normal in structure. Aortic valve regurgitation is trivial. Pulmonic Valve: The pulmonic valve was normal in structure. Pulmonic valve regurgitation is mild. Aorta: The aortic root and ascending aorta are structurally normal, with no evidence of dilitation. IAS/Shunts: No atrial level shunt detected by color flow Doppler.  LEFT VENTRICLE PLAX 2D LVIDd:         4.23 cm LVIDs:         3.60 cm LV PW:         1.36 cm LV IVS:        0.91 cm  LEFT ATRIUM         Index LA diam:    5.10 cm 2.93 cm/m  PULMONIC VALVE PV Vmax:        0.83 m/s PV Peak grad:   2.7 mmHg RVOT Peak grad: 2 mmHg  TRICUSPID VALVE TR Peak grad:   57.8 mmHg TR Vmax:        380.00 cm/s Serafina Royals MD Electronically signed by Serafina Royals MD Signature Date/Time: 05/22/2020/4:43:08 PM    Final    _0 @ No results found.   ASSESSMENT AND PLAN    -Multidisciplinary rounds held today  Atypical flutter with rapid ventricular response -s/p amiodarone gtt due to refactory RVR post esmolol and metoprolol -cardiology is on case appreciate input  -improved now off of amiodarone  Cholangitis with obstuctive jaundice S/p ERCP with post operative failure to wean from MV -s/p extubation  ICU telemetry monitoring ID is on case - appreciate input - continue meropenem   Chronic cardiac comorbidities  atrial flutter s/p DCCV x 3 and ablation in 2017/2018 -on eliquis chronically - coronary artery disease  - HFpEF   COPD with chronic hypoxemia -hold DuoNEB while patient is in AFL -consider steroids since patient is hypotensive   Acute on chronic renal failure stage 3 - d/c non-essential nephrotoxic medications -nephrology consultation   Severe protein calorie malnutrition - bitemporal and peripheral muscle  wasting -albumin <2 Dietary nutritional consultation   Septic shock   - due to bacteremia with klebsiella and Ecoli -present on admission  -use vasopressors to keep MAP>65 -follow ABG and LA -follow up cultures -c/w meropenem   Biliary obstruction with jaundice   -discussed with surgery team today - patient is unstable for any surgical intervention at this time and is not surgical candidate.   ID -continue IV abx as prescibed -follow up cultures  GI/Nutrition GI PROPHYLAXIS as indicated DIET-->TF's as tolerated Constipation protocol as indicated  ENDO - ICU hypoglycemic\Hyperglycemia protocol -check FSBS per protocol   ELECTROLYTES -follow labs as needed -replace as needed -pharmacy consultation   DVT/GI PRX ordered -SCDs  TRANSFUSIONS AS NEEDED MONITOR FSBS ASSESS the need for LABS as needed   Critical care provider statement:    Critical care time (minutes):  108   Critical care time was exclusive of:  Separately billable procedures and treating other patients   Critical care was necessary to treat or prevent imminent or life-threatening deterioration of the  following conditions:  septic shock, cholangitis, AFL with RVR, multiple comorbid conditions   Critical care was time spent personally by me on the following activities:  Development of treatment plan with patient or surrogate, discussions with consultants, evaluation of patient's response to treatment, examination of patient, obtaining history from patient or surrogate, ordering and performing treatments and interventions, ordering and review of laboratory studies and re-evaluation of patient's condition.  I assumed direction of critical care for this patient from another provider in my specialty: no    This document was prepared using Dragon voice recognition software and may include unintentional dictation errors.    Ottie Glazier, M.D.  Division of [place]

## 2020-05-23 NOTE — Progress Notes (Signed)
Patient successfully extubated to 2L [place], per MD request, with no complications. Saturations are 99-100% at this time.

## 2020-05-23 NOTE — Progress Notes (Signed)
ID  DOA 05/18/20 ERCP 05/21/20 One visibly occluded stent from the common bile duct was seen in the major papilla. - A single segmental biliary stricture was found in the lower third of the main bile duct. The stricture was malignant appearing. - One stent was removed from the common bile duct. - The biliary tree was swept and pus was found. - One plastic stent was placed into the common bile duct. - One plastic stent was placed into the common bile duct.  -intubated 05/21/20   CVC triple lumen 05/21/20 Foley catheter 05/22/20    Patient Vitals for the past 24 hrs:  BP Temp Temp src Pulse SpO2  05/23/20 0805 -- -- -- -- 94 %  05/23/20 0800 (!) 107/56 97.8 F (36.6 C) -- -- --  05/23/20 0700 (!) 83/59 -- -- (!) 50 100 %  05/23/20 0600 92/63 -- -- -- --  05/23/20 0500 91/66 -- -- (!) 53 100 %  05/23/20 0400 (!) 84/62 97.8 F (36.6 C) Oral (!) 54 100 %  05/23/20 0300 96/69 -- -- (!) 57 100 %  05/23/20 0000 (!) 85/59 -- -- (!) 59 100 %  05/22/20 2300 (!) 123/109 -- -- 77 100 %  05/22/20 2200 100/71 -- -- 77 100 %  05/22/20 2100 96/76 -- -- 84 100 %  05/22/20 2000 90/65 97.7 F (36.5 C) Oral 75 100 %  05/22/20 1800 (!) 97/59 98 F (36.7 C) Axillary 64 100 %  05/22/20 1700 97/71 -- -- 84 100 %  05/22/20 1600 (!) 89/68 -- -- (!) 59 100 %  05/22/20 1500 114/75 -- -- 63 99 %  05/22/20 1400 121/77 -- -- 88 99 %  05/22/20 1355 -- -- -- 98 99 %  05/22/20 1300 133/81 (!) 97.5 F (36.4 C) Axillary 95 98 %  05/22/20 1200 (!) 52/38 97.7 F (36.5 C) Axillary (!) 48 100 %    Pt extubated Lethargic Some confusion Icteric Chest b/l air entry Hs irregular abd soft Left IJ Foley    CBC Latest Ref Rng & Units 05/23/2020 05/22/2020 05/21/2020  WBC 4.0 - 10.5 K/uL 18.7(H) 21.4(H) 46.9(H)  Hemoglobin 13.0 - 17.0 g/dL 8.1(L) 8.5(L) 11.0(L)  Hematocrit 39 - 52 % 25.0(L) 25.9(L) 33.4(L)  Platelets 150 - 400 K/uL 68(L) 66(L) 81(L)    CMP Latest Ref Rng & Units 05/23/2020 05/22/2020 05/22/2020   Glucose 70 - 99 mg/dL 121(H) 159(H) 157(H)  BUN 8 - 23 mg/dL 79(H) 72(H) 70(H)  Creatinine 0.61 - 1.24 mg/dL 4.29(H) 4.04(H) 4.00(H)  Sodium 135 - 145 mmol/L 137 134(L) 137  Potassium 3.5 - 5.1 mmol/L 4.2 4.6 4.8  Chloride 98 - 111 mmol/L 101 104 106  CO2 22 - 32 mmol/L 25 18(L) 20(L)  Calcium 8.9 - 10.3 mg/dL 6.7(L) 6.8(L) 6.9(L)  Total Protein 6.5 - 8.1 g/dL 4.3(L) - 4.5(L)  Total Bilirubin 0.3 - 1.2 mg/dL 9.7(H) - 9.1(H)  Alkaline Phos 38 - 126 U/L 889(H) - 798(H)  AST 15 - 41 U/L 536(H) - 357(H)  ALT 0 - 44 U/L 298(H) - 231(H)   05/19/20 BC Klebsiella and e.coli  Impression/recommendation 74 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 Need to cover anerobes and enterococcus as well On meropenem- can be changed to zosyn. Keep an eye on low platelet while on zosyn which is initiated only today  Leucocytosis improving Worsening LFTS- combination of cholangitis/septic shock/CBD obstruction/tumor  Pancreatitis and pseudocyst complication of the pancreative Neuroendocrine tumor with stent in pancreatic duct   ?Thrombocytopenia - worsened by infection   CKD- worsening  Discussed the management with care team [facility] on call by phone this weekend for urgent questions

## 2020-05-23 NOTE — Progress Notes (Signed)
Sodium Bicarb drip decreased to 50 per Dr. Lanney Gins.

## 2020-05-24 DIAGNOSIS — R17 Unspecified jaundice: Secondary | ICD-10-CM

## 2020-05-24 LAB — CBC WITH DIFFERENTIAL/PLATELET
Abs Immature Granulocytes: 2.39 10*3/uL — ABNORMAL HIGH (ref 0.00–0.07)
Basophils Absolute: 0.1 10*3/uL (ref 0.0–0.1)
Basophils Relative: 0 %
Eosinophils Absolute: 0 10*3/uL (ref 0.0–0.5)
Eosinophils Relative: 0 %
HCT: 26.4 % — ABNORMAL LOW (ref 39.0–52.0)
Hemoglobin: 8.7 g/dL — ABNORMAL LOW (ref 13.0–17.0)
Immature Granulocytes: 11 %
Lymphocytes Relative: 1 %
Lymphs Abs: 0.3 10*3/uL — ABNORMAL LOW (ref 0.7–4.0)
MCH: 29.8 pg (ref 26.0–34.0)
MCHC: 33 g/dL (ref 30.0–36.0)
MCV: 90.4 fL (ref 80.0–100.0)
Monocytes Absolute: 1.3 10*3/uL — ABNORMAL HIGH (ref 0.1–1.0)
Monocytes Relative: 6 %
Neutro Abs: 17.4 10*3/uL — ABNORMAL HIGH (ref 1.7–7.7)
Neutrophils Relative %: 82 %
Platelets: 65 10*3/uL — ABNORMAL LOW (ref 150–400)
RBC: 2.92 MIL/uL — ABNORMAL LOW (ref 4.22–5.81)
RDW: 13.6 % (ref 11.5–15.5)
WBC: 21.4 10*3/uL — ABNORMAL HIGH (ref 4.0–10.5)
nRBC: 0.3 % — ABNORMAL HIGH (ref 0.0–0.2)

## 2020-05-24 LAB — GLUCOSE, CAPILLARY
Glucose-Capillary: 106 mg/dL — ABNORMAL HIGH (ref 70–99)
Glucose-Capillary: 109 mg/dL — ABNORMAL HIGH (ref 70–99)
Glucose-Capillary: 132 mg/dL — ABNORMAL HIGH (ref 70–99)
Glucose-Capillary: 69 mg/dL — ABNORMAL LOW (ref 70–99)
Glucose-Capillary: 72 mg/dL (ref 70–99)
Glucose-Capillary: 76 mg/dL (ref 70–99)
Glucose-Capillary: 78 mg/dL (ref 70–99)
Glucose-Capillary: 82 mg/dL (ref 70–99)
Glucose-Capillary: 82 mg/dL (ref 70–99)

## 2020-05-24 LAB — COMPREHENSIVE METABOLIC PANEL
ALT: 381 U/L — ABNORMAL HIGH (ref 0–44)
AST: 666 U/L — ABNORMAL HIGH (ref 15–41)
Albumin: 2.3 g/dL — ABNORMAL LOW (ref 3.5–5.0)
Alkaline Phosphatase: 989 U/L — ABNORMAL HIGH (ref 38–126)
Anion gap: 13 (ref 5–15)
BUN: 84 mg/dL — ABNORMAL HIGH (ref 8–23)
CO2: 27 mmol/L (ref 22–32)
Calcium: 6.9 mg/dL — ABNORMAL LOW (ref 8.9–10.3)
Chloride: 98 mmol/L (ref 98–111)
Creatinine, Ser: 4.55 mg/dL — ABNORMAL HIGH (ref 0.61–1.24)
GFR calc Af Amer: 14 mL/min — ABNORMAL LOW (ref >60)
GFR calc non Af Amer: 12 mL/min — ABNORMAL LOW (ref >60)
Glucose, Bld: 81 mg/dL (ref 70–99)
Potassium: 3.9 mmol/L (ref 3.5–5.1)
Sodium: 138 mmol/L (ref 135–145)
Total Bilirubin: 12.1 mg/dL — ABNORMAL HIGH (ref 0.3–1.2)
Total Protein: 5.1 g/dL — ABNORMAL LOW (ref 6.5–8.1)

## 2020-05-24 LAB — MAGNESIUM: Magnesium: 2.1 mg/dL (ref 1.7–2.4)

## 2020-05-24 MED ORDER — PIPERACILLIN-TAZOBACTAM IN DEX 2-0.25 GM/50ML IV SOLN
2.2500 g | Freq: Three times a day (TID) | INTRAVENOUS | Status: DC
  Administered 2020-05-24 – 2020-05-26 (×7): 2.25 g via INTRAVENOUS
  Filled 2020-05-24 (×11): qty 50

## 2020-05-24 MED ORDER — HYDROCORTISONE NA SUCCINATE PF 100 MG IJ SOLR
50.0000 mg | Freq: Every day | INTRAMUSCULAR | Status: DC
  Administered 2020-05-25 – 2020-05-27 (×3): 50 mg via INTRAVENOUS
  Filled 2020-05-24: qty 2
  Filled 2020-05-24 (×2): qty 1

## 2020-05-24 NOTE — Progress Notes (Signed)
[address] [place], [place] [address] Phone [phone]. Fax [phone]  Date: 05/24/2020                  Patient Name:  [name]  MRN: [id number]  DOB: 1946-05-21  Age / Sex: 74 y.o., male         PCP: [name], MD                 Service Requesting Consult: IM/ Ottie Glazier, MD                 Reason for Consult: ARF            History of Present Illness: Patient is a 74 y.o. male with medical problems of Pancreatic neuroendocrine tumor, chronic pancreatitis, atrial flutter status post ablation, requiring anticoagulation with Eliquis, hypertension, coronary disease, COPD, insulin-dependent type 2 diabetes, CKD stage IIIb, who was admitted to [facility] on 05/18/2020 for evaluation of Epigastric pain [R10.13] Abdominal pain [R10.9] Malignant neoplasm of pancreas, unspecified location of malignancy ([place]) [C25.9]  Patient has baseline chronic diffuse abdominal pain from his pancreatic cancer but came in for acute worsening with nausea and vomiting. Nephrology consult has been requested for evaluation of acute renal failure Patient is currently intubated and on multiple pressors.  He is not able to provide any meaningful information.  All information is obtained from the chart and primary team.  05/24/2020 update: Patient now extubated.  Currently awake and alert and conversant. Creatinine currently 4.6.  07/09 0701 - 07/10 0700 In: 1474.8 [I.V.:1324.8; IV Piggyback:149.9] Out: 715 [Urine:715]   Current medications: Current Facility-Administered Medications  Medication Dose Route Frequency Provider Last Rate Last Admin  . 0.9 %  sodium chloride infusion   Intravenous PRN Swayze, Ava, DO   Stopped at 05/20/20 0919  . 0.9 %  sodium chloride infusion  250 mL Intravenous Continuous Ottie Glazier, MD      . acetaminophen (TYLENOL) tablet 1,000 mg  1,000 mg Oral Q6H PRN Sharion Settler, NP   1,000 mg at 05/20/20 0000  . albumin human 25 % solution  12.5 g  12.5 g Intravenous Daily Ottie Glazier, MD 60 mL/hr at 05/24/20 0951 12.5 g at 05/24/20 0951  . chlorhexidine gluconate (MEDLINE KIT) (PERIDEX) 0.12 % solution 15 mL  15 mL Mouth Rinse BID Ottie Glazier, MD   15 mL at 05/24/20 0800  . Chlorhexidine Gluconate Cloth 2 % PADS 6 each  6 each Topical Daily Ottie Glazier, MD   6 each at 05/23/20 2133  . fentaNYL (SUBLIMAZE) bolus via infusion 50 mcg  50 mcg Intravenous Q15 min PRN Ottie Glazier, MD   50 mcg at 05/23/20 0739  . hydrocortisone sodium succinate (SOLU-CORTEF) 100 MG injection 50 mg  50 mg Intravenous Q6H Darel Hong D, NP   50 mg at 05/24/20 0855  . insulin aspart (novoLOG) injection 0-9 Units  0-9 Units Subcutaneous Q4H Darel Hong D, NP   1 Units at 05/23/20 0415  . insulin glargine (LANTUS) injection 12 Units  12 Units Subcutaneous QHS Tu, Ching T, DO   12 Units at 05/23/20 2133  . levothyroxine (SYNTHROID) tablet 25 mcg  25 mcg Oral q morning - 10a Tu, Ching T, DO   25 mcg at 05/21/20 0615  . MEDLINE mouth rinse  15 mL Mouth Rinse 10 times per day Ottie Glazier, MD   15 mL at 05/24/20 1232  . midazolam (VERSED) injection 2 mg  2 mg Intravenous Q15 min PRN  Ottie Glazier, MD   2 mg at 05/23/20 0123  . midodrine (PROAMATINE) tablet 10 mg  10 mg Oral TID Tu, Ching T, DO   10 mg at 05/21/20 1020  . mometasone-formoterol (DULERA) 100-5 MCG/ACT inhaler 2 puff  2 puff Inhalation BID Tu, Ching T, DO   2 puff at 05/24/20 0830  . morphine 2 MG/ML injection 1 mg  1 mg Intravenous Q2H PRN Tu, Ching T, DO   1 mg at 05/20/20 1613  . norepinephrine (LEVOPHED) 16 mg in 281m premix infusion  0-40 mcg/min Intravenous Titrated KBradly Bienenstock NP   Stopped at 05/23/20 [id number] . ondansetron (ZOFRAN) injection 4 mg  4 mg Intravenous Q6H PRN Tu, Ching T, DO   4 mg at 05/23/20 1854  . pantoprazole (PROTONIX) injection 40 mg  40 mg Intravenous Q24H GDallie Piles RPH   40 mg at 05/24/20 1232  . phenylephrine CONCENTRATED 1053min  sodium chloride 0.9% 25069m0.4mg25m) infusion  0-400 mcg/min Intravenous Titrated Aleskerov, Fuad, MD      . piperacillin-tazobactam (ZOSYN) IVPB 2.25 g  2.25 g Intravenous Q8H GrubDallie PilesH 100 mL/hr at 05/24/20 0926 2.25 g at 05/24/20 0926  . sodium bicarbonate 150 mEq in sterile water 1,000 mL infusion   Intravenous Continuous AlesOttie Glazier 50 mL/hr at 05/24/20 0200 Rate Verify at 05/24/20 0200  . vasopressin (PITRESSIN) 20 Units in sodium chloride 0.9 % 100 mL infusion-*FOR SHOCK*  0-0.03 Units/min Intravenous Continuous KeenBradly Bienenstock   Stopped at 05/23/20 0800  . vitamin B-12 (CYANOCOBALAMIN) tablet 500 mcg  500 mcg Oral Daily Tu, Ching T, DO   500 mcg at 05/22/20 1031     Vital Signs: Blood pressure 119/67, pulse 66, temperature 98.2 F (36.8 C), resp. rate (!) 5, height 5' 7" (1.702 m), weight 63.9 kg, SpO2 100 %.   Intake/Output Summary (Last 24 hours) at 05/24/2020 1234 Last data filed at 05/24/2020 0200 Gross per 24 hour  Intake 1474.78 ml  Output 715 ml  Net 759.78 ml    Weight trends: Filed Weights   05/18/20 1745 05/19/20 0206  Weight: 65.8 kg 63.9 kg    Physical Exam: General:  No acute distress  HEENT  scleral icterus noted, oral mucosa moist  Heart::  Regular, no rubs  Abdomen:  Soft, nondistended  Extremities:  1+ dependent edema  Neurologic:  Following simple commands  Skin:  Jaundice  Foley:  In place       Lab results: Basic Metabolic Panel: Recent Labs  Lab 05/22/20 0526 05/22/20 0526 05/22/20 0928 05/23/20 0416 05/24/20 0430  NA 137   < > 134* 137 138  K 4.8   < > 4.6 4.2 3.9  CL 106   < > 104 101 98  CO2 20*   < > 18* 25 27  GLUCOSE 157*   < > 159* 121* 81  BUN 70*   < > 72* 79* 84*  CREATININE 4.00*   < > 4.04* 4.29* 4.55*  CALCIUM 6.9*   < > 6.8* 6.7* 6.9*  MG 1.6*  --   --  2.0 2.1  PHOS  --   --   --  6.7*  --    < > = values in this interval not displayed.    Liver Function Tests: Recent Labs  Lab  05/24/20 0430  AST 666*  ALT 381*  ALKPHOS 989*  BILITOT 12.1*  PROT 5.1*  ALBUMIN 2.3*   Recent Labs  Lab 05/22/20 1036  LIPASE 22   No results for input(s): AMMONIA in the last 168 hours.  CBC: Recent Labs  Lab 05/23/20 0416 05/24/20 0430  WBC 18.7* 21.4*  NEUTROABS 15.7* 17.4*  HGB 8.1* 8.7*  HCT 25.0* 26.4*  MCV 91.2 90.4  PLT 68* 65*    Cardiac Enzymes: No results for input(s): CKTOTAL, TROPONINI in the last 168 hours.  BNP: Invalid input(s): POCBNP  CBG: Recent Labs  Lab 05/24/20 0014 05/24/20 0346 05/24/20 0719 05/24/20 1201 05/24/20 1203  GLUCAP 82 78 76 69* 72    Microbiology: Recent Results (from the past 720 hour(s))  SARS Coronavirus 2 by RT PCR (hospital order, performed in [facility] hospital lab) Nasopharyngeal Urine, Clean Catch     Status: None   Collection Time: 05/18/20 11:30 PM   Specimen: Urine, Clean Catch; Nasopharyngeal  Result Value Ref Range Status   SARS Coronavirus 2 NEGATIVE NEGATIVE Final    Comment: (NOTE) SARS-CoV-2 target nucleic acids are NOT DETECTED.  The SARS-CoV-2 RNA is generally detectable in upper and lower respiratory specimens during the acute phase of infection. The lowest concentration of SARS-CoV-2 viral copies this assay can detect is 250 copies / mL. A negative result does not preclude SARS-CoV-2 infection and should not be used as the sole basis for treatment or other patient management decisions.  A negative result may occur with improper specimen collection / handling, submission of specimen other than nasopharyngeal swab, presence of viral mutation(s) within the areas targeted by this assay, and inadequate number of viral copies (<250 copies / mL). A negative result must be combined with clinical observations, patient history, and epidemiological information.  Fact Sheet for Patients:   StrictlyIdeas.no  Fact Sheet for Healthcare  Providers: BankingDealers.co.za  This test is not yet approved or  cleared by the [place] FDA and has been authorized for detection and/or diagnosis of SARS-CoV-2 by FDA under an Emergency Use Authorization (EUA).  This EUA will remain in effect (meaning this test can be used) for the duration of the COVID-19 declaration under Section 564(b)(1) of the Act, 21 U.S.C. section 360bbb-3(b)(1), unless the authorization is terminated or revoked sooner.  Performed at [facility], [address] [place], [place] [address]   CULTURE, BLOOD (ROUTINE X 2) w Reflex to ID Panel     Status: Abnormal   Collection Time: 05/19/20  3:02 PM   Specimen: BLOOD  Result Value Ref Range Status   Specimen Description   Final    BLOOD RIGHT ANTECUBITAL Performed at [facility], [address] [place], [place] [address]    Special Requests   Final    BOTTLES DRAWN AEROBIC AND ANAEROBIC Blood Culture adequate volume Performed at [facility], [address], [place], [place] [address]    Culture  Setup Time   Final    GRAM NEGATIVE RODS IN BOTH AEROBIC AND ANAEROBIC BOTTLES CRITICAL RESULT CALLED TO, READ BACK BY AND VERIFIED WITH: Hart Robinsons [facility] [address] 05/20/20 HNM Performed at [facility], [place] [address], [place], [place] [address]    Culture KLEBSIELLA PNEUMONIAE ESCHERICHIA COLI  (A)  Final   Report Status 05/23/2020 FINAL  Final   Organism ID, Bacteria KLEBSIELLA PNEUMONIAE  Final   Organism ID, Bacteria ESCHERICHIA COLI  Final      Susceptibility   Escherichia coli - MIC*    AMPICILLIN 8 SENSITIVE Sensitive     CEFAZOLIN <=4 SENSITIVE Sensitive     CEFEPIME <=0.12  SENSITIVE Sensitive     CEFTAZIDIME <=1 SENSITIVE Sensitive     CEFTRIAXONE <=0.25 SENSITIVE Sensitive     CIPROFLOXACIN <=0.25 SENSITIVE Sensitive     GENTAMICIN <=1 SENSITIVE Sensitive     IMIPENEM <=0.25 SENSITIVE Sensitive     TRIMETH/SULFA <=20 SENSITIVE  Sensitive     AMPICILLIN/SULBACTAM <=2 SENSITIVE Sensitive     PIP/TAZO <=4 SENSITIVE Sensitive     * ESCHERICHIA COLI   Klebsiella pneumoniae - MIC*    AMPICILLIN RESISTANT Resistant     CEFAZOLIN <=4 SENSITIVE Sensitive     CEFEPIME <=0.12 SENSITIVE Sensitive     CEFTAZIDIME <=1 SENSITIVE Sensitive     CEFTRIAXONE <=0.25 SENSITIVE Sensitive     CIPROFLOXACIN <=0.25 SENSITIVE Sensitive     GENTAMICIN <=1 SENSITIVE Sensitive     IMIPENEM <=0.25 SENSITIVE Sensitive     TRIMETH/SULFA <=20 SENSITIVE Sensitive     AMPICILLIN/SULBACTAM 4 SENSITIVE Sensitive     PIP/TAZO <=4 SENSITIVE Sensitive     * KLEBSIELLA PNEUMONIAE  Blood Culture ID Panel (Reflexed)     Status: Abnormal   Collection Time: 05/19/20  3:02 PM  Result Value Ref Range Status   Enterococcus species NOT DETECTED NOT DETECTED Final   Listeria monocytogenes NOT DETECTED NOT DETECTED Final   Staphylococcus species NOT DETECTED NOT DETECTED Final   Staphylococcus aureus (BCID) NOT DETECTED NOT DETECTED Final   Streptococcus species NOT DETECTED NOT DETECTED Final   Streptococcus agalactiae NOT DETECTED NOT DETECTED Final   Streptococcus pneumoniae NOT DETECTED NOT DETECTED Final   Streptococcus pyogenes NOT DETECTED NOT DETECTED Final   Acinetobacter baumannii NOT DETECTED NOT DETECTED Final   Enterobacteriaceae species DETECTED (A) NOT DETECTED Final    Comment: CRITICAL RESULT CALLED TO, READ BACK BY AND VERIFIED WITH:  SCOTT HALL PHARMD 0335 05/20/20 HNM    Enterobacter cloacae complex NOT DETECTED NOT DETECTED Final   Escherichia coli DETECTED (A) NOT DETECTED Final    Comment: CRITICAL RESULT CALLED TO, READ BACK BY AND VERIFIED WITH: SCOTT HALL PHARMD 0335 05/20/20 HNM    Klebsiella oxytoca NOT DETECTED NOT DETECTED Final   Klebsiella pneumoniae DETECTED (A) NOT DETECTED Final    Comment: CRITICAL RESULT CALLED TO, READ BACK BY AND VERIFIED WITH: SCOTT HALL PHARMD 0335 05/20/20 HNM    Proteus species NOT DETECTED NOT  DETECTED Final   Serratia marcescens NOT DETECTED NOT DETECTED Final   Carbapenem resistance NOT DETECTED NOT DETECTED Final   Haemophilus influenzae NOT DETECTED NOT DETECTED Final   Neisseria meningitidis NOT DETECTED NOT DETECTED Final   Pseudomonas aeruginosa NOT DETECTED NOT DETECTED Final   Candida albicans NOT DETECTED NOT DETECTED Final   Candida glabrata NOT DETECTED NOT DETECTED Final   Candida krusei NOT DETECTED NOT DETECTED Final   Candida parapsilosis NOT DETECTED NOT DETECTED Final   Candida tropicalis NOT DETECTED NOT DETECTED Final    Comment: Performed at [facility], [address] [place], [place] [address]  CULTURE, BLOOD (ROUTINE X 2) w Reflex to ID Panel     Status: Abnormal   Collection Time: 05/19/20  3:14 PM   Specimen: BLOOD  Result Value Ref Range Status   Specimen Description   Final    BLOOD BLOOD RIGHT HAND Performed at [facility], [address] [place], [place] [address]    Special Requests   Final    BOTTLES DRAWN AEROBIC AND ANAEROBIC Blood Culture adequate volume Performed at [facility], [address] [place], [place] [address]  Culture  Setup Time   Final    GRAM NEGATIVE RODS IN BOTH AEROBIC AND ANAEROBIC BOTTLES CRITICAL VALUE NOTED.  VALUE IS CONSISTENT WITH PREVIOUSLY REPORTED AND CALLED VALUE. Performed at [facility], [address] [place], [place] [address]    Culture KLEBSIELLA PNEUMONIAE (A)  Final   Report Status 05/22/2020 FINAL  Final   Organism ID, Bacteria KLEBSIELLA PNEUMONIAE  Final      Susceptibility   Klebsiella pneumoniae - MIC*    AMPICILLIN RESISTANT Resistant     CEFAZOLIN <=4 SENSITIVE Sensitive     CEFEPIME <=0.12 SENSITIVE Sensitive     CEFTAZIDIME <=1 SENSITIVE Sensitive     CEFTRIAXONE <=0.25 SENSITIVE Sensitive     CIPROFLOXACIN <=0.25 SENSITIVE Sensitive     GENTAMICIN <=1 SENSITIVE Sensitive     IMIPENEM <=0.25 SENSITIVE Sensitive     TRIMETH/SULFA <=20  SENSITIVE Sensitive     AMPICILLIN/SULBACTAM 4 SENSITIVE Sensitive     PIP/TAZO <=4 SENSITIVE Sensitive     * KLEBSIELLA PNEUMONIAE  MRSA PCR Screening     Status: None   Collection Time: 05/21/20  5:24 PM   Specimen: Nasopharyngeal  Result Value Ref Range Status   MRSA by PCR NEGATIVE NEGATIVE Final    Comment:        The GeneXpert MRSA Assay (FDA approved for NASAL specimens only), is one component of a comprehensive MRSA colonization surveillance program. It is not intended to diagnose MRSA infection nor to guide or monitor treatment for MRSA infections. Performed at [facility], [address] [place], [place] [address]      Coagulation Studies: Recent Labs    05/21/20 1850 05/22/20 0928  LABPROT 15.6* 16.4*  INR 1.3* 1.4*    Urinalysis: No results for input(s): COLORURINE, LABSPEC, PHURINE, GLUCOSEU, HGBUR, BILIRUBINUR, KETONESUR, PROTEINUR, UROBILINOGEN, NITRITE, LEUKOCYTESUR in the last 72 hours.  Invalid input(s): APPERANCEUR      Imaging: No results found.   Assessment & Plan: Pt is a 74 y.o.  medical problems of Pancreatic neuroendocrine tumor, chronic pancreatitis, atrial flutter status post ablation, requiring anticoagulation with Eliquis, hypertension, coronary disease, COPD, insulin-dependent type 2 diabetes, CKD stage IIIb,   male with , was admitted on 05/18/2020 with Epigastric pain [R10.13] Abdominal pain [R10.9] Malignant neoplasm of pancreas, unspecified location of malignancy ([place]) [C25.9]  #.  Acute kidney injury on chronic kidney disease stage IV Baseline creatinine 2.6/GFR 27  Urinalysis (July 4) proteinuria 100 mg/dL, 0-5 RBCs, 0-5 WBC Imaging: July 5 CT abdomen: Pancreatitis, pancreatic pseudocysts, bile duct distention, left adrenal mass, mild perinephric stranding on the left.  No hydronephrosis  Acute kidney injury is likely secondary to concurrent illness, sepsis leading to ATN  Urine output 715 cc over the preceding 24  hours.  Urine output does appear to be improving.  Therefore no immediate need for dialysis at the moment.  Creatinine slightly higher at 4.5.  #Acute respiratory failure Patient now extubated.  Breathing comfortably.  Continue to monitor respiratory status.  #Sepsis Klebsiella bacteremia from July 5.   Antibiotic dose adjustment per pharmacy.  #Pancreatic neuroendocrine tumor with pseudocyst, worsening biliary duct dilatation,  patient underwent ERCP on July 7 where occluded stent and purulent drainage in the common bile duct He underwent pus drainage from the biliary tree, stent placement in the common bile duct 7/7    LOS: 5 Munsoor Lateef 7/10/202112:34 PM    Note: This note was prepared with Dragon dictation. Any transcription errors are unintentional

## 2020-05-24 NOTE — Consult Note (Signed)
PHARMACY CONSULT NOTE  Pharmacy Consult for Electrolyte Monitoring and Replacement   Recent Labs: Potassium (mmol/L)  Date Value  05/24/2020 3.9  11/01/2014 3.8   Magnesium (mg/dL)  Date Value  05/24/2020 2.1  10/30/2014 1.8   Calcium (mg/dL)  Date Value  05/24/2020 6.9 (L)   Calcium, Total (mg/dL)  Date Value  11/01/2014 9.4   Albumin (g/dL)  Date Value  05/24/2020 2.3 (L)  10/29/2014 3.0 (L)   Phosphorus (mg/dL)  Date Value  05/23/2020 6.7 (H)   Sodium (mmol/L)  Date Value  05/24/2020 138  10/11/2017 140  11/01/2014 138   Corrected Ca: 8.26 mg/dL  Assessment: 74 y.o. male with a history of CKD, COPD, atrial flutter s/p DCCV x 3 and ablation in 2017/2018, anticoagulated with Eliquis, CAD, diabetes, pancreatic cancer, recently diagnosed who presents with epigastric abdominal pain now with E coli and Klebsiella bacteremia.  MIVF: sodium bicarb infusion at 125 mL/hr  Goal of Therapy:  Potassium 4.0 - 5.1 mmol/L Magnesium 2.0 - 2.4 mg/dL All Other Electrolytes WNL  Plan:   No electrolyte replacement warranted today  F/U electrolytes in am 7/11  Dallie Piles ,PharmD Clinical Pharmacist 05/24/2020 7:15 AM

## 2020-05-24 NOTE — Progress Notes (Signed)
Good day. More alert. Started on soft diet. Talkative.

## 2020-05-24 NOTE — Progress Notes (Signed)
CRITICAL CARE PROGRESS NOTE    Name: [name] MRN: [id number] DOB: Nov 02, 1946     LOS: 5   SUBJECTIVE FINDINGS & SIGNIFICANT EVENTS    Patient description:  [name] is a 74 year old male with a past medical history significant for atrial flutter s/p DCCV x 3 and ablation in 2017/2018, anticoagulated with Eliquis, coronary artery disease detected by chest CT in 2020, HFpEF, oxygen dependent COPD, hypothyroidism, type 2 diabetes, hypertension, and recently diagnosed pancreatic cancer who presented to the ED on 05/18/20 for epigastric pain with associated nausea and vomiting. Abdominal CT revealed pancreatitis with a presumed pseudocyst with worsening of the biliary ductal dilatation.  Blood cultures were positive for  polymicrobial bacteremia.  Blood cultures having both Klebsiella and E. 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  Course complicated by renal failure and initial dialysis need, GI bleed and pneumonia leading to septic shock.  Subsequently improved and off dialysis.  ERCP performed on 3/30 showed severe localized biliary stricture and underwent sphincterotomy with balloon sphincteroplasty.  He also had a biliary stent placement.  He also had a pancreatic duct stent placement he completed course of Zosyn post procedure.  The brushings were positive for tumor . Post ERCP patient was unable to be extubated and PCCM consult placed for further evaluation and mgt. On arrival patient is in [place].  He received esmolol prior to arrival without imrovement in HR which was >150. He then received 5lopressor and amiodarone gtt for rate control    05/22/20- patient remains on multiple vasopressors for septic and obstructive shock with multiorgan failure syndrome.   He is jaundiced with shock liver and anuric renal failure.  I have discussed his case with surgery team and at this time there are no surgical options for patient due to severe instability.   I met with 3 nieces today including POA.  We reviewed his medical history including lifelong smoking with advanced COPD FEV148% in 2017, complex cardiac hx with CHF and AFL s/p ablation x2, CKD3, PVD, neuroendocrine cancer well differentiated with metastassis and pancreatic cancer with biliary obstruction s/p ERCP with stenting and restestenting and now obstructive jaundice with shock.  They had numerous questions which we discussed.  POA states that son is not the decision maker for health care and they have advance directive in paper documentation. They wish to proceed with comfort measures due to grim prognosis long term.  POA has also asked to wait until tommorow so they can inform son and see if he wishes to come in person to see patient. We will accomodate family request.      05/23/20-After aggressive rescucitative efforts patient was weaned off pressors overnight and weaned down on mechanical ventilation with successful liberation from ventilator today. Still sedated due to CKD with recirculation of drugs/metabolites.   05/24/20- patient awake lucid interactive was able to move extermities to verbal communication. Will order PT/OT today continue sepsis therapy as previous.    Goals of Care: Had second family meeting: present is POA (Mrs Ambulance person) and niece Laverta [place]).  Discussed care plan and acute on chronic comorbid conditions. Patient remains jaundiced with worsening LFTs in sepsis. Despite short term improvement overall long term prognosis is poor.  As per family when fully lucid 2 weesk ago patient himself stated he did not wish to have aggressive measures including any artifical life support.  Specifically POA agrees that patient would not wish for CPR/ACLS or  endotracheal intubation/MV.   Patient  Code status: DNR  Lines/tubes : Airway 7 mm (Active)  Secured at (cm) 22 cm 05/21/20 1645  Measured From Lips 05/21/20 [place] 05/21/20 1645  Secured By [employer] 05/21/20 1645  Cuff Pressure (cm H2O) 26 cm H2O 05/21/20 1645  Site Condition Cool;Dry 05/21/20 1645    Microbiology/Sepsis markers: Results for orders placed or performed during the hospital encounter of 05/18/20  SARS Coronavirus 2 by RT PCR (hospital order, performed in [facility] hospital lab) Nasopharyngeal Urine, Clean Catch     Status: None   Collection Time: 05/18/20 11:30 PM   Specimen: Urine, Clean Catch; Nasopharyngeal  Result Value Ref Range Status   SARS Coronavirus 2 NEGATIVE NEGATIVE Final    Comment: (NOTE) SARS-CoV-2 target nucleic acids are NOT DETECTED.  The SARS-CoV-2 RNA is generally detectable in upper and lower respiratory specimens during the acute phase of infection. The lowest concentration of SARS-CoV-2 viral copies this assay can detect is 250 copies / mL. A negative result does not preclude SARS-CoV-2 infection and should not be used as the sole basis for treatment or other patient management decisions.  A negative result may occur with improper specimen collection / handling, submission of specimen other than nasopharyngeal swab, presence of viral mutation(s) within the areas targeted by this assay, and inadequate number of viral copies (<250 copies / mL). A negative result must be combined with clinical observations, patient history, and epidemiological information.  Fact Sheet for Patients:   StrictlyIdeas.no  Fact Sheet for Healthcare Providers: BankingDealers.co.za  This test is not yet approved or  cleared by the [place] FDA and has been authorized for detection and/or diagnosis of SARS-CoV-2 by FDA under an Emergency Use Authorization (EUA).  This EUA will remain in effect (meaning this test  can be used) for the duration of the COVID-19 declaration under Section 564(b)(1) of the Act, 21 U.S.C. section 360bbb-3(b)(1), unless the authorization is terminated or revoked sooner.  Performed at [facility], [address] [place], [place] [address]   CULTURE, BLOOD (ROUTINE X 2) w Reflex to ID Panel     Status: Abnormal   Collection Time: 05/19/20  3:02 PM   Specimen: BLOOD  Result Value Ref Range Status   Specimen Description   Final    BLOOD RIGHT ANTECUBITAL Performed at [facility], [address] [place], [place] [address]    Special Requests   Final    BOTTLES DRAWN AEROBIC AND ANAEROBIC Blood Culture adequate volume Performed at [facility], [address] [place], [place] [address]    Culture  Setup Time   Final    GRAM NEGATIVE RODS IN BOTH AEROBIC AND ANAEROBIC BOTTLES CRITICAL RESULT CALLED TO, READ BACK BY AND VERIFIED WITH: Hart Robinsons [facility] [id number] 05/20/20 HNM Performed at [facility], [place] [address], [place], [place] [address]    Culture KLEBSIELLA PNEUMONIAE ESCHERICHIA COLI  (A)  Final   Report Status 05/23/2020 FINAL  Final   Organism ID, Bacteria KLEBSIELLA PNEUMONIAE  Final   Organism ID, Bacteria ESCHERICHIA COLI  Final      Susceptibility   Escherichia coli - MIC*    AMPICILLIN 8 SENSITIVE Sensitive     CEFAZOLIN <=4 SENSITIVE Sensitive     CEFEPIME <=0.12 SENSITIVE Sensitive     CEFTAZIDIME <=1 SENSITIVE Sensitive     CEFTRIAXONE <=0.25 SENSITIVE Sensitive     CIPROFLOXACIN <=0.25 SENSITIVE Sensitive  GENTAMICIN <=1 SENSITIVE Sensitive     IMIPENEM <=0.25 SENSITIVE Sensitive     TRIMETH/SULFA <=20 SENSITIVE Sensitive     AMPICILLIN/SULBACTAM <=2 SENSITIVE Sensitive     PIP/TAZO <=4 SENSITIVE Sensitive     * ESCHERICHIA COLI   Klebsiella pneumoniae - MIC*    AMPICILLIN RESISTANT Resistant     CEFAZOLIN <=4 SENSITIVE Sensitive     CEFEPIME <=0.12 SENSITIVE Sensitive     CEFTAZIDIME <=1 SENSITIVE  Sensitive     CEFTRIAXONE <=0.25 SENSITIVE Sensitive     CIPROFLOXACIN <=0.25 SENSITIVE Sensitive     GENTAMICIN <=1 SENSITIVE Sensitive     IMIPENEM <=0.25 SENSITIVE Sensitive     TRIMETH/SULFA <=20 SENSITIVE Sensitive     AMPICILLIN/SULBACTAM 4 SENSITIVE Sensitive     PIP/TAZO <=4 SENSITIVE Sensitive     * KLEBSIELLA PNEUMONIAE  Blood Culture ID Panel (Reflexed)     Status: Abnormal   Collection Time: 05/19/20  3:02 PM  Result Value Ref Range Status   Enterococcus species NOT DETECTED NOT DETECTED Final   Listeria monocytogenes NOT DETECTED NOT DETECTED Final   Staphylococcus species NOT DETECTED NOT DETECTED Final   Staphylococcus aureus (BCID) NOT DETECTED NOT DETECTED Final   Streptococcus species NOT DETECTED NOT DETECTED Final   Streptococcus agalactiae NOT DETECTED NOT DETECTED Final   Streptococcus pneumoniae NOT DETECTED NOT DETECTED Final   Streptococcus pyogenes NOT DETECTED NOT DETECTED Final   Acinetobacter baumannii NOT DETECTED NOT DETECTED Final   Enterobacteriaceae species DETECTED (A) NOT DETECTED Final    Comment: CRITICAL RESULT CALLED TO, READ BACK BY AND VERIFIED WITH:  SCOTT HALL PHARMD [id number] 05/20/20 HNM    Enterobacter cloacae complex NOT DETECTED NOT DETECTED Final   Escherichia coli DETECTED (A) NOT DETECTED Final    Comment: CRITICAL RESULT CALLED TO, READ BACK BY AND VERIFIED WITH: SCOTT HALL PHARMD 0335 05/20/20 HNM    Klebsiella oxytoca NOT DETECTED NOT DETECTED Final   Klebsiella pneumoniae DETECTED (A) NOT DETECTED Final    Comment: CRITICAL RESULT CALLED TO, READ BACK BY AND VERIFIED WITH: SCOTT HALL PHARMD 0335 05/20/20 HNM    Proteus species NOT DETECTED NOT DETECTED Final   Serratia marcescens NOT DETECTED NOT DETECTED Final   Carbapenem resistance NOT DETECTED NOT DETECTED Final   Haemophilus influenzae NOT DETECTED NOT DETECTED Final   Neisseria meningitidis NOT DETECTED NOT DETECTED Final   Pseudomonas aeruginosa NOT DETECTED NOT DETECTED  Final   Candida albicans NOT DETECTED NOT DETECTED Final   Candida glabrata NOT DETECTED NOT DETECTED Final   Candida krusei NOT DETECTED NOT DETECTED Final   Candida parapsilosis NOT DETECTED NOT DETECTED Final   Candida tropicalis NOT DETECTED NOT DETECTED Final    Comment: Performed at [facility], [address] [place], [place] [address]  CULTURE, BLOOD (ROUTINE X 2) w Reflex to ID Panel     Status: Abnormal   Collection Time: 05/19/20  3:14 PM   Specimen: BLOOD  Result Value Ref Range Status   Specimen Description   Final    BLOOD BLOOD RIGHT HAND Performed at [facility], [address], [place], [place] [address]    Special Requests   Final    BOTTLES DRAWN AEROBIC AND ANAEROBIC Blood Culture adequate volume Performed at [facility], [address] [place], [place] [address]    Culture  Setup Time   Final    GRAM NEGATIVE RODS IN BOTH AEROBIC AND ANAEROBIC BOTTLES CRITICAL VALUE NOTED.  VALUE IS CONSISTENT WITH  PREVIOUSLY REPORTED AND CALLED VALUE. Performed at [facility], [address] [place], [place] [address]    Culture KLEBSIELLA PNEUMONIAE (A)  Final   Report Status 05/22/2020 FINAL  Final   Organism ID, Bacteria KLEBSIELLA PNEUMONIAE  Final      Susceptibility   Klebsiella pneumoniae - MIC*    AMPICILLIN RESISTANT Resistant     CEFAZOLIN <=4 SENSITIVE Sensitive     CEFEPIME <=0.12 SENSITIVE Sensitive     CEFTAZIDIME <=1 SENSITIVE Sensitive     CEFTRIAXONE <=0.25 SENSITIVE Sensitive     CIPROFLOXACIN <=0.25 SENSITIVE Sensitive     GENTAMICIN <=1 SENSITIVE Sensitive     IMIPENEM <=0.25 SENSITIVE Sensitive     TRIMETH/SULFA <=20 SENSITIVE Sensitive     AMPICILLIN/SULBACTAM 4 SENSITIVE Sensitive     PIP/TAZO <=4 SENSITIVE Sensitive     * KLEBSIELLA PNEUMONIAE  MRSA PCR Screening     Status: None   Collection Time: 05/21/20  5:24 PM   Specimen: Nasopharyngeal  Result Value Ref Range Status   MRSA by PCR NEGATIVE  NEGATIVE Final    Comment:        The GeneXpert MRSA Assay (FDA approved for NASAL specimens only), is one component of a comprehensive MRSA colonization surveillance program. It is not intended to diagnose MRSA infection nor to guide or monitor treatment for MRSA infections. Performed at [facility], [address], [place], [place] [address]     Anti-infectives:  Anti-infectives (From admission, onward)   Start     Dose/Rate Route Frequency Ordered Stop   05/24/20 0900  piperacillin-tazobactam (ZOSYN) IVPB 2.25 g     Discontinue     2.25 g 100 mL/hr over 30 Minutes Intravenous Every 8 hours 05/24/20 0758     05/23/20 1300  piperacillin-tazobactam (ZOSYN) IVPB 3.375 g  Status:  Discontinued        3.375 g 12.5 mL/hr over 240 Minutes Intravenous Every 12 hours 05/23/20 1122 05/24/20 0758   05/20/20 0600  meropenem (MERREM) 1 g in sodium chloride 0.9 % 100 mL IVPB  Status:  Discontinued        1 g 200 mL/hr over 30 Minutes Intravenous Every 12 hours 05/20/20 0344 05/23/20 1122       Consults: Treatment Team:  [name], MD Teodoro Spray, MD Ottie Glazier, MD Murlean Iba, MD       PAST MEDICAL HISTORY   Past Medical History:  Diagnosis Date  . Aortic atherosclerosis ([place])    As seen on 04/2019 CT  . Atrial flutter ([place])    a. s/p ablation 2017/2018 and DCCV 2015/2016/2019 b. 2017 echo EF 60-65%, no RWMA, mild MR,  . CAD (coronary artery disease)    3v CAD 04/2019 CT  . Carcinoid tumor    metastatic  . CKD (chronic kidney disease), stage III   . COPD (chronic obstructive pulmonary disease) ([facility])    PM oxygen  . Current occasional smoker    1-2 cigarettes / week  . Diabetes mellitus type 2, insulin dependent ([place])   . Heart failure with preserved ejection fraction ([place])    2017 echo EF 60-65%, no RWMA, mild MR,    . Hyperlipidemia   . Hypertension   . Pancreatitis   . Polycythemia      SURGICAL HISTORY   Past Surgical History:    Procedure Laterality Date  . A-FLUTTER ABLATION N/A 10/14/2017   Procedure: A-FLUTTER ABLATION;  Surgeon: Constance Haw, MD;  Location: [facility];  Service: Cardiovascular;  Laterality: N/A;  . CARDIOVERSION N/A 09/08/2017   Procedure: CARDIOVERSION;  Surgeon: [name], MD;  Location: [facility] ORS;  Service: Cardiovascular;  Laterality: N/A;  . CARDIOVERSION N/A 03/22/2018   Procedure: CARDIOVERSION;  Surgeon: [name], MD;  Location: [facility] OR[facility];  Service: Cardiovascular;  Laterality: N/A;  . COLONOSCOPY    . COLONOSCOPY WITH PROPOFOL N/A 10/31/2019   Procedure: COLONOSCOPY WITH PROPOFOL;  Surgeon: Lin Landsman, MD;  Location: [facility] END[facility]COPY;  Service: Gastroenterology;  Laterality: N/A;  . ELECTROPHYSIOLOGIC STUDY N/A 10/27/2016   Procedure: A-Flutter Ablation;  Surgeon: Deboraha Sprang, MD;  Location: [facility];  Service: Cardiovascular;  Laterality: N/A;  . ENDOSCOPIC RETROGRADE CHOLANGIOPANCREATOGRAPHY (ERCP) WITH PROPOFOL N/A 05/21/2020   Procedure: ENDOSCOPIC RETROGRADE CHOLANGIOPANCREATOGRAPHY (ERCP) WITH PROPOFOL;  Surgeon: [name], MD;  Location: [facility] ENDOSCOPY;  Service: Endoscopy;  Laterality: N/A;  . EUS N/A 12/22/2017   Procedure: FULL UPPER ENDOSCOPIC ULTRASOUND (EUS) RADIAL;  Surgeon: Reita Cliche, MD;  Location: [facility] ENDOSCOPY;  Service: Gastroenterology;  Laterality: N/A;  . TEE WITH CARDIOVERSION  12/16  . TEMPORARY DIALYSIS CATHETER N/A 02/08/2020   Procedure: TEMPORARY DIALYSIS CATHETER;  Surgeon: Algernon Huxley, MD;  Location: [facility];  Service: Cardiovascular;  Laterality: N/A;     FAMILY HISTORY   Family History  Problem Relation Age of Onset  . Hypertension Father      SOCIAL HISTORY   Social History   Tobacco Use  . Smoking status: Light Tobacco Smoker    Years: 45.00    Types: Cigarettes    Last attempt to quit: 11/27/2012    Years since quitting: 7.4  . Smokeless tobacco: Never Used  Vaping  Use  . Vaping Use: Never used  Substance Use Topics  . Alcohol use: Yes    Alcohol/week: 2.0 standard drinks    Types: 2 Cans of beer per week    Comment: beer every once a while once a monthnone last 24hrs  . Drug use: No     MEDICATIONS   Current Medication:  Current Facility-Administered Medications:  .  0.9 %  sodium chloride infusion, , Intravenous, PRN, Swayze, Ava, DO, Stopped at 05/20/20 0919 .  0.9 %  sodium chloride infusion, 250 mL, Intravenous, Continuous, Berna Gitto, MD .  acetaminophen (TYLENOL) tablet 1,000 mg, 1,000 mg, Oral, Q6H PRN, Sharion Settler, NP, 1,000 mg at 05/20/20 0000 .  albumin human 25 % solution 12.5 g, 12.5 g, Intravenous, Daily, Lanney Gins, Manar Smalling, MD, Last Rate: 60 mL/hr at 05/24/20 0951, 12.5 g at 05/24/20 0951 .  chlorhexidine gluconate (MEDLINE KIT) (PERIDEX) 0.12 % solution 15 mL, 15 mL, Mouth Rinse, BID, Kimberle Stanfill, MD, 15 mL at 05/24/20 0800 .  Chlorhexidine Gluconate Cloth 2 % PADS 6 each, 6 each, Topical, Daily, Ottie Glazier, MD, 6 each at 05/23/20 2133 .  fentaNYL (SUBLIMAZE) bolus via infusion 50 mcg, 50 mcg, Intravenous, Q15 min PRN, Ottie Glazier, MD, 50 mcg at 05/23/20 0739 .  hydrocortisone sodium succinate (SOLU-CORTEF) 100 MG injection 50 mg, 50 mg, Intravenous, Q6H, Darel Hong D, NP, 50 mg at 05/24/20 0855 .  insulin aspart (novoLOG) injection 0-9 Units, 0-9 Units, Subcutaneous, Q4H, Darel Hong D, NP, 1 Units at 05/23/20 0415 .  insulin glargine (LANTUS) injection 12 Units, 12 Units, Subcutaneous, QHS, Tu, Ching T, DO, 12 Units at 05/23/20 2133 .  levothyroxine (SYNTHROID) tablet 25 mcg, 25 mcg, Oral, q morning - 10a, Tu, Ching T, DO, 25 mcg at  05/21/20 0615 .  MEDLINE mouth rinse, 15 mL, Mouth Rinse, 10 times per day, Ottie Glazier, MD, 15 mL at 05/24/20 0954 .  midazolam (VERSED) injection 2 mg, 2 mg, Intravenous, Q15 min PRN, Ottie Glazier, MD, 2 mg at 05/23/20 0123 .  midodrine (PROAMATINE) tablet 10 mg,  10 mg, Oral, TID, Tu, Ching T, DO, 10 mg at 05/21/20 1020 .  mometasone-formoterol (DULERA) 100-5 MCG/ACT inhaler 2 puff, 2 puff, Inhalation, BID, Tu, Ching T, DO, 2 puff at 05/21/20 0859 .  morphine 2 MG/ML injection 1 mg, 1 mg, Intravenous, Q2H PRN, Tu, Ching T, DO, 1 mg at 05/20/20 1613 .  norepinephrine (LEVOPHED) 16 mg in 249m premix infusion, 0-40 mcg/min, Intravenous, Titrated, KBradly Bienenstock NP, Stopped at 05/23/20 [id number].  ondansetron (ZOFRAN) injection 4 mg, 4 mg, Intravenous, Q6H PRN, Tu, Ching T, DO, 4 mg at 05/23/20 1854 .  pantoprazole (PROTONIX) injection 40 mg, 40 mg, Intravenous, Q24H, GDallie Piles RPH, 40 mg at 05/23/20 1252 .  phenylephrine CONCENTRATED 1079min sodium chloride 0.9% 25068m0.4mg65m) infusion, 0-400 mcg/min, Intravenous, Titrated, Zyanya Glaza, MD .  piperacillin-tazobactam (ZOSYN) IVPB 2.25 g, 2.25 g, Intravenous, Q8H, GrubDallie PilesH, Last Rate: 100 mL/hr at 05/24/20 0926, 2.25 g at 05/24/20 0926 .  sodium bicarbonate 150 mEq in sterile water 1,000 mL infusion, , Intravenous, Continuous, Torrance Frech, MD, Last Rate: 50 mL/hr at 05/24/20 0200, Rate Verify at 05/24/20 0200 .  vasopressin (PITRESSIN) 20 Units in sodium chloride 0.9 % 100 mL infusion-*FOR SHOCK*, 0-0.03 Units/min, Intravenous, Continuous, KeenBradly Bienenstock, Stopped at 05/23/20 0800 .  vitamin B-12 (CYANOCOBALAMIN) tablet 500 mcg, 500 mcg, Oral, Daily, Tu, Ching T, DO, 500 mcg at 05/22/20 1031    ALLERGIES   Levaquin [levofloxacin in d5w]    REVIEW OF SYSTEMS     Unable to obtain due to mechanical intubation   PHYSICAL EXAMINATION   Vital Signs: Temp:  [97.7 F (36.5 C)-98.2 F (36.8 C)] 98.2 F (36.8 C) (07/10 0800) Pulse Rate:  [66-76] 66 (07/10 0800) BP: (115-130)/(60-74) 119/67 (07/10 0800) SpO2:  [93 %-100 %] 100 % (07/10 0800)  GENERAL:chronically ill appearing on [place] HEAD: Normocephalic, atraumatic.  EYES: Pupils equal, round, reactive to light.  No  scleral icterus. Jaundiced MOUTH: Moist mucosal membrane. NECK: Supple. No thyromegaly. No nodules. No JVD.  PULMONARY: bilateral mild rhonchi CARDIOVASCULAR: S1 and S2. Regular rate and rhythm. No murmurs, rubs, or gallops.  GASTROINTESTINAL: Soft, nontender, non-distended. No masses. Positive bowel sounds. No hepatosplenomegaly.  MUSCULOSKELETAL: No swelling, clubbing, or edema.  NEUROLOGIC: GCS11 SKIN:intact,warm,dry   PERTINENT DATA     Infusions: . sodium chloride Stopped (05/20/20 0919)  . sodium chloride    . albumin human 12.5 g (05/24/20 0951)  . norepinephrine (LEVOPHED) Adult infusion Stopped (05/23/20 0521)  . phenylephrine (NEO-SYNEPHRINE) Adult infusion    . piperacillin-tazobactam (ZOSYN)  IV 2.25 g (05/24/20 0926)  .  sodium bicarbonate (isotonic) infusion in sterile water 50 mL/hr at 05/24/20 0200  . vasopressin Stopped (05/23/20 0800)   Scheduled Medications: . chlorhexidine gluconate (MEDLINE KIT)  15 mL Mouth Rinse BID  . Chlorhexidine Gluconate Cloth  6 each Topical Daily  . hydrocortisone sod succinate (SOLU-CORTEF) inj  50 mg Intravenous Q6H  . insulin aspart  0-9 Units Subcutaneous Q4H  . insulin glargine  12 Units Subcutaneous QHS  . levothyroxine  25 mcg Oral q morning - 10a  . mouth rinse  15 mL Mouth Rinse 10 times per day  .  midodrine  10 mg Oral TID  . mometasone-formoterol  2 puff Inhalation BID  . pantoprazole (PROTONIX) IV  40 mg Intravenous Q24H  . vitamin B-12  500 mcg Oral Daily   PRN Medications: sodium chloride, acetaminophen, fentaNYL, midazolam, morphine injection, ondansetron (ZOFRAN) IV Hemodynamic parameters:   Intake/Output: 07/09 0701 - 07/10 0700 In: 1474.8 [I.V.:1324.8; IV Piggyback:149.9] Out: 715 [Urine:715]  Ventilator  Settings:      LAB RESULTS:  Basic Metabolic Panel: Recent Labs  Lab 05/21/20 1937 05/21/20 1937 05/22/20 0526 05/22/20 0526 05/22/20 0928 05/22/20 0928 05/23/20 0416 05/24/20 0430  NA  134*  --  137  --  134*  --  137 138  K 4.8   < > 4.8   < > 4.6   < > 4.2 3.9  CL 108  --  106  --  104  --  101 98  CO2 15*  --  20*  --  18*  --  25 27  GLUCOSE 125*  --  157*  --  159*  --  121* 81  BUN 67*  --  70*  --  72*  --  79* 84*  CREATININE 4.32*  --  4.00*  --  4.04*  --  4.29* 4.55*  CALCIUM 7.6*  --  6.9*  --  6.8*  --  6.7* 6.9*  MG  --   --  1.6*  --   --   --  2.0 2.1  PHOS  --   --   --   --   --   --  6.7*  --    < > = values in this interval not displayed.   Liver Function Tests: Recent Labs  Lab 05/20/20 0439 05/21/20 0741 05/22/20 0526 05/23/20 0416 05/24/20 0430  AST 94* 186* 357* 536* 666*  ALT 130* 161* 231* 298* 381*  ALKPHOS 591* 650* 798* 889* 989*  BILITOT 6.1* 8.2* 9.1* 9.7* 12.1*  PROT 4.5* 4.5* 4.5* 4.3* 5.1*  ALBUMIN 2.0* 2.0* 1.9* 1.9* 2.3*   Recent Labs  Lab 05/18/20 1747 05/19/20 1502 05/22/20 1036  LIPASE 100* 19 22   No results for input(s): AMMONIA in the last 168 hours. CBC: Recent Labs  Lab 05/21/20 0741 05/21/20 1850 05/22/20 0526 05/23/20 0416 05/24/20 0430  WBC 25.8* 46.9* 21.4* 18.7* 21.4*  NEUTROABS 19.6*  --  19.1* 15.7* 17.4*  HGB 8.8* 11.0* 8.5* 8.1* 8.7*  HCT 27.3* 33.4* 25.9* 25.0* 26.4*  MCV 92.9 91.3 91.8 91.2 90.4  PLT 52* 81* 66* 68* 65*   Cardiac Enzymes: No results for input(s): CKTOTAL, CKMB, CKMBINDEX, TROPONINI in the last 168 hours. BNP: Invalid input(s): POCBNP CBG: Recent Labs  Lab 05/23/20 1613 05/23/20 1918 05/24/20 0014 05/24/20 0346 05/24/20 0719  GLUCAP 87 87 82 78 76       IMAGING RESULTS:  Imaging: ECHOCARDIOGRAM COMPLETE  Result Date: 05/22/2020    ECHOCARDIOGRAM REPORT   Patient Name:   SEVERIANO UTSEY [facility] Date of Exam: 05/22/2020 Medical Rec #:  [id number]             Height:       67.0 in Accession #:    [id number]            Weight:       140.9 lb Date of Birth:  February 14, 1946             BSA:          1.742 m Patient Age:    41  years              BP:           97/68 mmHg  Patient Gender: M                     HR:           62 bpm. Exam Location:  [facility] Procedure: 2D Echo, Cardiac Doppler and Color Doppler Indications:     Shock  History:         Patient has prior history of Echocardiogram examinations, most                  recent 09/23/2016. COPD, Arrythmias:Atrial Flutter; Risk                  Factors:Hypertension, Diabetes and Dyslipidemia.  Sonographer:     Sherrie Sport RDCS (AE) Referring Phys:  [id number] Bradly Bienenstock Diagnosing Phys: Serafina Royals MD  Sonographer Comments: No parasternal window, no apical window and echo performed with patient supine and on artificial respirator. The subcostal was the only view obtainable. and Image acquisition challenging due to COPD. IMPRESSIONS  1. Left ventricular ejection fraction, by estimation, is 35 to 40%. The left ventricle has moderately decreased function. The left ventricle demonstrates regional wall motion abnormalities (see scoring diagram/findings for description). Left ventricular  diastolic parameters were normal.  2. Right ventricular systolic function is moderately reduced. The right ventricular size is moderately enlarged. Mildly increased right ventricular wall thickness. There is severely elevated pulmonary artery systolic pressure.  3. Left atrial size was mild to moderately dilated.  4. Right atrial size was mild to moderately dilated.  5. The mitral valve is normal in structure. Mild to moderate mitral valve regurgitation.  6. Tricuspid valve regurgitation is moderate.  7. The aortic valve is normal in structure. Aortic valve regurgitation is trivial. FINDINGS  Left Ventricle: Left ventricular ejection fraction, by estimation, is 35 to 40%. The left ventricle has moderately decreased function. The left ventricle demonstrates regional wall motion abnormalities. Moderate hypokinesis of the left ventricular, entire inferoseptal wall. The left ventricular internal cavity size was normal in size. There is no left  ventricular hypertrophy. Left ventricular diastolic parameters were normal. Right Ventricle: The right ventricular size is moderately enlarged. Mildly increased right ventricular wall thickness. Right ventricular systolic function is moderately reduced. There is severely elevated pulmonary artery systolic pressure. The tricuspid  regurgitant velocity is 3.80 m/s, and with an assumed right atrial pressure of 10 mmHg, the estimated right ventricular systolic pressure is 31.5 mmHg. Left Atrium: Left atrial size was mild to moderately dilated. Right Atrium: Right atrial size was mild to moderately dilated. Pericardium: There is no evidence of pericardial effusion. Mitral Valve: The mitral valve is normal in structure. Mild to moderate mitral valve regurgitation. Tricuspid Valve: The tricuspid valve is normal in structure. Tricuspid valve regurgitation is moderate. Aortic Valve: The aortic valve is normal in structure. Aortic valve regurgitation is trivial. Pulmonic Valve: The pulmonic valve was normal in structure. Pulmonic valve regurgitation is mild. Aorta: The aortic root and ascending aorta are structurally normal, with no evidence of dilitation. IAS/Shunts: No atrial level shunt detected by color flow Doppler.  LEFT VENTRICLE PLAX 2D LVIDd:         4.23 cm LVIDs:         3.60 cm LV PW:         1.36 cm LV IVS:  0.91 cm  LEFT ATRIUM         Index LA diam:    5.10 cm 2.93 cm/m  PULMONIC VALVE PV Vmax:        0.83 m/s PV Peak grad:   2.7 mmHg RVOT Peak grad: 2 mmHg  TRICUSPID VALVE TR Peak grad:   57.8 mmHg TR Vmax:        380.00 cm/s Serafina Royals MD Electronically signed by Serafina Royals MD Signature Date/Time: 05/22/2020/4:43:08 PM    Final    _0 @ No results found.      ASSESSMENT AND PLAN    -Multidisciplinary rounds held today  Atypical flutter with rapid ventricular response-resolved  -s/p amiodarone gtt due to refactory RVR post esmolol and metoprolol -cardiology is on case appreciate  input  -improved now off of amiodarone  Cholangitis with obstuctive jaundice S/p ERCP with post operative failure to wean from MV -s/p extubation  ICU telemetry monitoring ID is on case - appreciate input - continue meropenem -patient with progressively worsening LFTs -05/24/20   Chronic cardiac comorbidities  atrial flutter s/p DCCV x 3 and ablation in 2017/2018 -on eliquis chronically - coronary artery disease  - HFpEF   COPD with chronic hypoxemia -hold DuoNEB while patient is in AFL -consider steroids since patient is hypotensive   Acute on chronic renal failure stage 3 - d/c non-essential nephrotoxic medications -nephrology consultation   Severe protein calorie malnutrition - bitemporal and peripheral muscle wasting -albumin <2 Dietary nutritional consultation   Septic shock - -shock physiology resolved but sepsis persists  - due to bacteremia with klebsiella and Ecoli -present on admission  -c/w meropenem -judicious use of IVF due to bilateral pulm interstitial edema   Biliary obstruction with jaundice   -discussed with surgery team today - patient is unstable for any surgical intervention at this time and is not surgical candidate.   ID -continue IV abx as prescibed -follow up cultures  GI/Nutrition GI PROPHYLAXIS as indicated DIET-->TF's as tolerated Constipation protocol as indicated  ENDO - ICU hypoglycemic\Hyperglycemia protocol -check FSBS per protocol   ELECTROLYTES -follow labs as needed -replace as needed -pharmacy consultation   DVT/GI PRX ordered -SCDs  TRANSFUSIONS AS NEEDED MONITOR FSBS ASSESS the need for LABS as needed   Critical care provider statement:    Critical care time (minutes):  109   Critical care time was exclusive of:  Separately billable procedures and treating other patients   Critical care was necessary to treat or prevent imminent or life-threatening deterioration of the following conditions:  septic shock,  cholangitis, AFL with RVR, multiple comorbid conditions   Critical care was time spent personally by me on the following activities:  Development of treatment plan with patient or surrogate, discussions with consultants, evaluation of patient's response to treatment, examination of patient, obtaining history from patient or surrogate, ordering and performing treatments and interventions, ordering and review of laboratory studies and re-evaluation of patient's condition.  I assumed direction of critical care for this patient from another provider in my specialty: no    This document was prepared using Dragon voice recognition software and may include unintentional dictation errors.    Ottie Glazier, M.D.  Division of [facility]

## 2020-05-24 NOTE — Progress Notes (Signed)
[name] , MD [address], [place], [place], [place], [address] [address], [place], [name], [place], [address] Phone: [phone]  Fax: [phone]   [name] is being followed for acute cholangitis S/p stent change   Subjective:   States he feels much better, not in any pain, alert and orientated x3  Objective: Vital signs in last 24 hours: Vitals:   05/24/20 0200 05/24/20 0400 05/24/20 0600 05/24/20 0800  BP: 124/70 117/65 115/65 119/67  Pulse: 70 67 66 66  Resp:      Temp:  98 F (36.7 C)  98.2 F (36.8 C)  TempSrc:  Oral    SpO2: 100% 98% 99% 100%  Weight:      Height:       Weight change:   Intake/Output Summary (Last 24 hours) at 05/24/2020 1100 Last data filed at 05/24/2020 0200 Gross per 24 hour  Intake 1474.78 ml  Output 715 ml  Net 759.78 ml     Exam: Heart:: Regular rate and rhythm, S1S2 present or without murmur or extra heart sounds Lungs: normal, clear to auscultation and clear to auscultation and percussion Abdomen: soft, nontender, normal bowel sounds   Lab Results: @LABTEST2 @ Micro Results: Recent Results (from the past 240 hour(s))  SARS Coronavirus 2 by RT PCR (hospital order, performed in [facility] hospital lab) Nasopharyngeal Urine, Clean Catch     Status: None   Collection Time: 05/18/20 11:30 PM   Specimen: Urine, Clean Catch; Nasopharyngeal  Result Value Ref Range Status   SARS Coronavirus 2 NEGATIVE NEGATIVE Final    Comment: (NOTE) SARS-CoV-2 target nucleic acids are NOT DETECTED.  The SARS-CoV-2 RNA is generally detectable in upper and lower respiratory specimens during the acute phase of infection. The lowest concentration of SARS-CoV-2 viral copies this assay can detect is 250 copies / mL. A negative result does not preclude SARS-CoV-2 infection and should not be used as the sole basis for treatment or other patient management decisions.  A negative result may occur with improper specimen  collection / handling, submission of specimen other than nasopharyngeal swab, presence of viral mutation(s) within the areas targeted by this assay, and inadequate number of viral copies (<250 copies / mL). A negative result must be combined with clinical observations, patient history, and epidemiological information.  Fact Sheet for Patients:   StrictlyIdeas.no  Fact Sheet for Healthcare Providers: BankingDealers.co.za  This test is not yet approved or  cleared by the [place] FDA and has been authorized for detection and/or diagnosis of SARS-CoV-2 by FDA under an Emergency Use Authorization (EUA).  This EUA will remain in effect (meaning this test can be used) for the duration of the COVID-19 declaration under Section 564(b)(1) of the Act, 21 U.S.C. section 360bbb-3(b)(1), unless the authorization is terminated or revoked sooner.  Performed at [facility], [address] [place], [place] [address]   CULTURE, BLOOD (ROUTINE X 2) w Reflex to ID Panel     Status: Abnormal   Collection Time: 05/19/20  3:02 PM   Specimen: BLOOD  Result Value Ref Range Status   Specimen Description   Final    BLOOD RIGHT ANTECUBITAL Performed at [facility], [address] [place], [place] [address]    Special Requests   Final    BOTTLES DRAWN AEROBIC AND ANAEROBIC Blood Culture adequate volume Performed at [facility], [address], [place], [place] [address]    Culture  Setup Time   Final  GRAM NEGATIVE RODS IN BOTH AEROBIC AND ANAEROBIC BOTTLES CRITICAL RESULT CALLED TO, READ BACK BY AND VERIFIED WITH: Hart Robinsons [facility] [id number] 05/20/20 HNM Performed at [place] [address], [place], [place] [address]    Culture KLEBSIELLA PNEUMONIAE ESCHERICHIA COLI  (A)  Final   Report Status 05/23/2020 FINAL  Final   Organism ID, Bacteria KLEBSIELLA PNEUMONIAE  Final   Organism ID, Bacteria ESCHERICHIA  COLI  Final      Susceptibility   Escherichia coli - MIC*    AMPICILLIN 8 SENSITIVE Sensitive     CEFAZOLIN <=4 SENSITIVE Sensitive     CEFEPIME <=0.12 SENSITIVE Sensitive     CEFTAZIDIME <=1 SENSITIVE Sensitive     CEFTRIAXONE <=0.25 SENSITIVE Sensitive     CIPROFLOXACIN <=0.25 SENSITIVE Sensitive     GENTAMICIN <=1 SENSITIVE Sensitive     IMIPENEM <=0.25 SENSITIVE Sensitive     TRIMETH/SULFA <=20 SENSITIVE Sensitive     AMPICILLIN/SULBACTAM <=2 SENSITIVE Sensitive     PIP/TAZO <=4 SENSITIVE Sensitive     * ESCHERICHIA COLI   Klebsiella pneumoniae - MIC*    AMPICILLIN RESISTANT Resistant     CEFAZOLIN <=4 SENSITIVE Sensitive     CEFEPIME <=0.12 SENSITIVE Sensitive     CEFTAZIDIME <=1 SENSITIVE Sensitive     CEFTRIAXONE <=0.25 SENSITIVE Sensitive     CIPROFLOXACIN <=0.25 SENSITIVE Sensitive     GENTAMICIN <=1 SENSITIVE Sensitive     IMIPENEM <=0.25 SENSITIVE Sensitive     TRIMETH/SULFA <=20 SENSITIVE Sensitive     AMPICILLIN/SULBACTAM 4 SENSITIVE Sensitive     PIP/TAZO <=4 SENSITIVE Sensitive     * KLEBSIELLA PNEUMONIAE  Blood Culture ID Panel (Reflexed)     Status: Abnormal   Collection Time: 05/19/20  3:02 PM  Result Value Ref Range Status   Enterococcus species NOT DETECTED NOT DETECTED Final   Listeria monocytogenes NOT DETECTED NOT DETECTED Final   Staphylococcus species NOT DETECTED NOT DETECTED Final   Staphylococcus aureus (BCID) NOT DETECTED NOT DETECTED Final   Streptococcus species NOT DETECTED NOT DETECTED Final   Streptococcus agalactiae NOT DETECTED NOT DETECTED Final   Streptococcus pneumoniae NOT DETECTED NOT DETECTED Final   Streptococcus pyogenes NOT DETECTED NOT DETECTED Final   Acinetobacter baumannii NOT DETECTED NOT DETECTED Final   Enterobacteriaceae species DETECTED (A) NOT DETECTED Final    Comment: CRITICAL RESULT CALLED TO, READ BACK BY AND VERIFIED WITH:  SCOTT HALL PHARMD [id number] 05/20/20 HNM    Enterobacter cloacae complex NOT DETECTED NOT DETECTED  Final   Escherichia coli DETECTED (A) NOT DETECTED Final    Comment: CRITICAL RESULT CALLED TO, READ BACK BY AND VERIFIED WITH: SCOTT HALL PHARMD 0335 05/20/20 HNM    Klebsiella oxytoca NOT DETECTED NOT DETECTED Final   Klebsiella pneumoniae DETECTED (A) NOT DETECTED Final    Comment: CRITICAL RESULT CALLED TO, READ BACK BY AND VERIFIED WITH: SCOTT HALL PHARMD 0335 05/20/20 HNM    Proteus species NOT DETECTED NOT DETECTED Final   Serratia marcescens NOT DETECTED NOT DETECTED Final   Carbapenem resistance NOT DETECTED NOT DETECTED Final   Haemophilus influenzae NOT DETECTED NOT DETECTED Final   Neisseria meningitidis NOT DETECTED NOT DETECTED Final   Pseudomonas aeruginosa NOT DETECTED NOT DETECTED Final   Candida albicans NOT DETECTED NOT DETECTED Final   Candida glabrata NOT DETECTED NOT DETECTED Final   Candida krusei NOT DETECTED NOT DETECTED Final   Candida parapsilosis NOT DETECTED NOT DETECTED Final   Candida tropicalis NOT DETECTED NOT DETECTED Final  Comment: Performed at [facility], [address] [place], [place] [address]  CULTURE, BLOOD (ROUTINE X 2) w Reflex to ID Panel     Status: Abnormal   Collection Time: 05/19/20  3:14 PM   Specimen: BLOOD  Result Value Ref Range Status   Specimen Description   Final    BLOOD BLOOD RIGHT HAND Performed at [facility], [address], [place], [place] [address]    Special Requests   Final    BOTTLES DRAWN AEROBIC AND ANAEROBIC Blood Culture adequate volume Performed at [facility], [address] [place], [place] [address]    Culture  Setup Time   Final    GRAM NEGATIVE RODS IN BOTH AEROBIC AND ANAEROBIC BOTTLES CRITICAL VALUE NOTED.  VALUE IS CONSISTENT WITH PREVIOUSLY REPORTED AND CALLED VALUE. Performed at [facility], [address] [place], [place] [address]    Culture KLEBSIELLA PNEUMONIAE (A)  Final   Report Status 05/22/2020 FINAL  Final   Organism ID, Bacteria KLEBSIELLA  PNEUMONIAE  Final      Susceptibility   Klebsiella pneumoniae - MIC*    AMPICILLIN RESISTANT Resistant     CEFAZOLIN <=4 SENSITIVE Sensitive     CEFEPIME <=0.12 SENSITIVE Sensitive     CEFTAZIDIME <=1 SENSITIVE Sensitive     CEFTRIAXONE <=0.25 SENSITIVE Sensitive     CIPROFLOXACIN <=0.25 SENSITIVE Sensitive     GENTAMICIN <=1 SENSITIVE Sensitive     IMIPENEM <=0.25 SENSITIVE Sensitive     TRIMETH/SULFA <=20 SENSITIVE Sensitive     AMPICILLIN/SULBACTAM 4 SENSITIVE Sensitive     PIP/TAZO <=4 SENSITIVE Sensitive     * KLEBSIELLA PNEUMONIAE  MRSA PCR Screening     Status: None   Collection Time: 05/21/20  5:24 PM   Specimen: Nasopharyngeal  Result Value Ref Range Status   MRSA by PCR NEGATIVE NEGATIVE Final    Comment:        The GeneXpert MRSA Assay (FDA approved for NASAL specimens only), is one component of a comprehensive MRSA colonization surveillance program. It is not intended to diagnose MRSA infection nor to guide or monitor treatment for MRSA infections. Performed at [facility], [address] [place],  [address]    Studies/Results: ECHOCARDIOGRAM COMPLETE  Result Date: 05/22/2020    ECHOCARDIOGRAM REPORT   Patient Name:   SHELBY ANDERLE [facility] Date of Exam: 05/22/2020 Medical Rec #:  [id number]             Height:       67.0 in Accession #:    [id number]            Weight:       140.9 lb Date of Birth:  April 28, 1946             BSA:          1.742 m Patient Age:    75 years              BP:           97/68 mmHg Patient Gender: M                     HR:           62 bpm. Exam Location:  [facility] Procedure: 2D Echo, Cardiac Doppler and Color Doppler Indications:     Shock  History:         Patient has prior history of Echocardiogram examinations, most  recent 09/23/2016. COPD, Arrythmias:Atrial Flutter; Risk                  Factors:Hypertension, Diabetes and Dyslipidemia.  Sonographer:     Sherrie Sport RDCS (AE) Referring Phys:  [id number] Bradly Bienenstock Diagnosing Phys: Serafina Royals MD  Sonographer Comments: No parasternal window, no apical window and echo performed with patient supine and on artificial respirator. The subcostal was the only view obtainable. and Image acquisition challenging due to COPD. IMPRESSIONS  1. Left ventricular ejection fraction, by estimation, is 35 to 40%. The left ventricle has moderately decreased function. The left ventricle demonstrates regional wall motion abnormalities (see scoring diagram/findings for description). Left ventricular  diastolic parameters were normal.  2. Right ventricular systolic function is moderately reduced. The right ventricular size is moderately enlarged. Mildly increased right ventricular wall thickness. There is severely elevated pulmonary artery systolic pressure.  3. Left atrial size was mild to moderately dilated.  4. Right atrial size was mild to moderately dilated.  5. The mitral valve is normal in structure. Mild to moderate mitral valve regurgitation.  6. Tricuspid valve regurgitation is moderate.  7. The aortic valve is normal in structure. Aortic valve regurgitation is trivial. FINDINGS  Left Ventricle: Left ventricular ejection fraction, by estimation, is 35 to 40%. The left ventricle has moderately decreased function. The left ventricle demonstrates regional wall motion abnormalities. Moderate hypokinesis of the left ventricular, entire inferoseptal wall. The left ventricular internal cavity size was normal in size. There is no left ventricular hypertrophy. Left ventricular diastolic parameters were normal. Right Ventricle: The right ventricular size is moderately enlarged. Mildly increased right ventricular wall thickness. Right ventricular systolic function is moderately reduced. There is severely elevated pulmonary artery systolic pressure. The tricuspid  regurgitant velocity is 3.80 m/s, and with an assumed right atrial pressure of 10 mmHg, the estimated right ventricular systolic  pressure is 33.5 mmHg. Left Atrium: Left atrial size was mild to moderately dilated. Right Atrium: Right atrial size was mild to moderately dilated. Pericardium: There is no evidence of pericardial effusion. Mitral Valve: The mitral valve is normal in structure. Mild to moderate mitral valve regurgitation. Tricuspid Valve: The tricuspid valve is normal in structure. Tricuspid valve regurgitation is moderate. Aortic Valve: The aortic valve is normal in structure. Aortic valve regurgitation is trivial. Pulmonic Valve: The pulmonic valve was normal in structure. Pulmonic valve regurgitation is mild. Aorta: The aortic root and ascending aorta are structurally normal, with no evidence of dilitation. IAS/Shunts: No atrial level shunt detected by color flow Doppler.  LEFT VENTRICLE PLAX 2D LVIDd:         4.23 cm LVIDs:         3.60 cm LV PW:         1.36 cm LV IVS:        0.91 cm  LEFT ATRIUM         Index LA diam:    5.10 cm 2.93 cm/m  PULMONIC VALVE PV Vmax:        0.83 m/s PV Peak grad:   2.7 mmHg RVOT Peak grad: 2 mmHg  TRICUSPID VALVE TR Peak grad:   57.8 mmHg TR Vmax:        380.00 cm/s Serafina Royals MD Electronically signed by Serafina Royals MD Signature Date/Time: 05/22/2020/4:43:08 PM    Final    Medications: I have reviewed the patient's current medications. Scheduled Meds: . chlorhexidine gluconate (MEDLINE KIT)  15 mL Mouth Rinse BID  . Chlorhexidine Gluconate  Cloth  6 each Topical Daily  . hydrocortisone sod succinate (SOLU-CORTEF) inj  50 mg Intravenous Q6H  . insulin aspart  0-9 Units Subcutaneous Q4H  . insulin glargine  12 Units Subcutaneous QHS  . levothyroxine  25 mcg Oral q morning - 10a  . mouth rinse  15 mL Mouth Rinse 10 times per day  . midodrine  10 mg Oral TID  . mometasone-formoterol  2 puff Inhalation BID  . pantoprazole (PROTONIX) IV  40 mg Intravenous Q24H  . vitamin B-12  500 mcg Oral Daily   Continuous Infusions: . sodium chloride Stopped (05/20/20 0919)  . sodium chloride     . albumin human 12.5 g (05/24/20 0951)  . norepinephrine (LEVOPHED) Adult infusion Stopped (05/23/20 0521)  . phenylephrine (NEO-SYNEPHRINE) Adult infusion    . piperacillin-tazobactam (ZOSYN)  IV 2.25 g (05/24/20 0926)  .  sodium bicarbonate (isotonic) infusion in sterile water 50 mL/hr at 05/24/20 0200  . vasopressin Stopped (05/23/20 0800)   PRN Meds:.sodium chloride, acetaminophen, fentaNYL, midazolam, morphine injection, ondansetron (ZOFRAN) IV   Assessment: Principal Problem:   Acute cholangitis Active Problems:   Typical atrial flutter (HCC)   Essential hypertension   Chronic obstructive pulmonary disease (HCC)   Diabetes type 2, uncontrolled (HCC)   CAD (coronary artery disease)   Abdominal pain   Acute kidney injury superimposed on chronic kidney disease (HCC)   Transaminitis   Neoplasm related pain   Primary pancreatic neuroendocrine tumor   Protein-calorie malnutrition, severe   Sepsis ([place])   Bacteremia   Malignant neoplasm of pancreas ([place])  [name] 74 y.o. male S/p ERCP and stent change for acute cholangitis, pancreatitis. . Has elevated T bilirubin and transaminases . [place] has risin slightly since yesterday. 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 I believe discussion in progress regarding comfort care measures. IF plan is for full medical care then would suggest to repeat abdominal imaging tomorrow if labs are any worse  to determine status of pancreatitis, cysts and splenic venous vasculature to determine if there is any reason for persistence of elevated bilirubin, WCC which could all still most likely be from sepsis, shock liver , obstruction and likely a combination.      LOS: 5 days   Carlos Bellows, MD 05/24/2020, 11:00 AM

## 2020-05-25 LAB — GLUCOSE, CAPILLARY
Glucose-Capillary: 148 mg/dL — ABNORMAL HIGH (ref 70–99)
Glucose-Capillary: 140 mg/dL — ABNORMAL HIGH (ref 70–99)
Glucose-Capillary: 194 mg/dL — ABNORMAL HIGH (ref 70–99)
Glucose-Capillary: 201 mg/dL — ABNORMAL HIGH (ref 70–99)

## 2020-05-25 LAB — RENAL FUNCTION PANEL
Albumin: 2 g/dL — ABNORMAL LOW (ref 3.5–5.0)
Anion gap: 16 — ABNORMAL HIGH (ref 5–15)
BUN: 90 mg/dL — ABNORMAL HIGH (ref 8–23)
CO2: 25 mmol/L (ref 22–32)
Calcium: 6.9 mg/dL — ABNORMAL LOW (ref 8.9–10.3)
Chloride: 97 mmol/L — ABNORMAL LOW (ref 98–111)
Creatinine, Ser: 4.95 mg/dL — ABNORMAL HIGH (ref 0.61–1.24)
GFR calc Af Amer: 12 mL/min — ABNORMAL LOW (ref >60)
GFR calc non Af Amer: 11 mL/min — ABNORMAL LOW (ref >60)
Glucose, Bld: 143 mg/dL — ABNORMAL HIGH (ref 70–99)
Phosphorus: 8.4 mg/dL — ABNORMAL HIGH (ref 2.5–4.6)
Potassium: 3.9 mmol/L (ref 3.5–5.1)
Sodium: 138 mmol/L (ref 135–145)

## 2020-05-25 LAB — MAGNESIUM: Magnesium: 2.2 mg/dL (ref 1.7–2.4)

## 2020-05-25 MED ORDER — POTASSIUM CHLORIDE 20 MEQ/15ML (10%) PO SOLN
20.0000 meq | Freq: Once | ORAL | Status: AC
  Administered 2020-05-25: 20 meq via ORAL
  Filled 2020-05-25: qty 15

## 2020-05-25 MED ORDER — INSULIN ASPART 100 UNIT/ML ~~LOC~~ SOLN
0.0000 [IU] | Freq: Three times a day (TID) | SUBCUTANEOUS | Status: DC
  Administered 2020-05-25: 3 [IU] via SUBCUTANEOUS
  Administered 2020-05-26: 2 [IU] via SUBCUTANEOUS
  Administered 2020-05-26: 5 [IU] via SUBCUTANEOUS
  Administered 2020-05-26 (×2): 3 [IU] via SUBCUTANEOUS
  Administered 2020-05-27: 2 [IU] via SUBCUTANEOUS
  Administered 2020-05-27: 3 [IU] via SUBCUTANEOUS
  Administered 2020-05-28: 2 [IU] via SUBCUTANEOUS
  Administered 2020-05-28: 1 [IU] via SUBCUTANEOUS
  Administered 2020-05-28: 2 [IU] via SUBCUTANEOUS
  Filled 2020-05-25 (×9): qty 1

## 2020-05-25 NOTE — Progress Notes (Signed)
CRITICAL CARE PROGRESS NOTE    Name: [name] MRN: [id number] DOB: 11/13/1946     LOS: 38   SUBJECTIVE FINDINGS & SIGNIFICANT EVENTS    Patient description:  [name] is a 74 year old male with a past medical history significant for atrial flutter s/p DCCV x 3 and ablation in 2017/2018, anticoagulated with Eliquis, coronary artery disease detected by chest CT in 2020, HFpEF, oxygen dependent COPD, hypothyroidism, type 2 diabetes, hypertension, and recently diagnosed pancreatic cancer who presented to the ED on 05/18/20 for epigastric pain with associated nausea and vomiting. Abdominal CT revealed pancreatitis with a presumed pseudocyst with worsening of the biliary ductal dilatation.  Blood cultures were positive for  polymicrobial bacteremia.  Blood cultures having both Klebsiella and E. 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  Course complicated by renal failure and initial dialysis need, GI bleed and pneumonia leading to septic shock.  Subsequently improved and off dialysis.  ERCP performed on 3/30 showed severe localized biliary stricture and underwent sphincterotomy with balloon sphincteroplasty.  He also had a biliary stent placement.  He also had a pancreatic duct stent placement he completed course of Zosyn post procedure.  The brushings were positive for tumor . Post ERCP patient was unable to be extubated and PCCM consult placed for further evaluation and mgt. On arrival patient is in [place].  He received esmolol prior to arrival without imrovement in HR which was >150. He then received 5lopressor and amiodarone gtt for rate control    05/22/20- patient remains on multiple vasopressors for septic and obstructive shock with multiorgan failure syndrome.   He is jaundiced with shock liver and anuric renal failure.  I have discussed his case with surgery team and at this time there are no surgical options for patient due to severe instability.   I met with 3 nieces today including POA.  We reviewed his medical history including lifelong smoking with advanced COPD FEV148% in 2017, complex cardiac hx with CHF and AFL s/p ablation x2, CKD3, PVD, neuroendocrine cancer well differentiated with metastassis and pancreatic cancer with biliary obstruction s/p ERCP with stenting and restestenting and now obstructive jaundice with shock.  They had numerous questions which we discussed.  POA states that son is not the decision maker for health care and they have advance directive in paper documentation. They wish to proceed with comfort measures due to grim prognosis long term.  POA has also asked to wait until tommorow so they can inform son and see if he wishes to come in person to see patient. We will accomodate family request.      05/23/20-After aggressive rescucitative efforts patient was weaned off pressors overnight and weaned down on mechanical ventilation with successful liberation from ventilator today. Still sedated due to CKD with recirculation of drugs/metabolites.   05/24/20- patient awake lucid interactive was able to move extermities to verbal communication. Will order PT/OT today continue sepsis therapy as previous.    Goals of Care: Had second family meeting: present is POA (Mrs Ambulance person) and niece Laverta [place]).  Discussed care plan and acute on chronic comorbid conditions. Patient remains jaundiced with worsening LFTs in sepsis. Despite short term improvement overall long term prognosis is poor.  As per family when fully lucid 2 weesk ago patient himself stated he did not wish to have aggressive measures including any artifical life support.  Specifically POA agrees that patient would not wish for CPR/ACLS or  endotracheal intubation/MV.   Patient  Code status: DNR   05/25/2020- patient remains stable, have signed out to [facility] today - Dr Francine Graven   Lines/tubes : Airway 7 mm (Active)  Secured at (cm) 22 cm 05/21/20 1645  Measured From Lips 05/21/20 [place] 05/21/20 1645  Secured By [employer] 05/21/20 1645  Cuff Pressure (cm H2O) 26 cm H2O 05/21/20 1645  Site Condition Cool;Dry 05/21/20 1645    Microbiology/Sepsis markers: Results for orders placed or performed during the hospital encounter of 05/18/20  SARS Coronavirus 2 by RT PCR (hospital order, performed in [facility] hospital lab) Nasopharyngeal Urine, Clean Catch     Status: None   Collection Time: 05/18/20 11:30 PM   Specimen: Urine, Clean Catch; Nasopharyngeal  Result Value Ref Range Status   SARS Coronavirus 2 NEGATIVE NEGATIVE Final    Comment: (NOTE) SARS-CoV-2 target nucleic acids are NOT DETECTED.  The SARS-CoV-2 RNA is generally detectable in upper and lower respiratory specimens during the acute phase of infection. The lowest concentration of SARS-CoV-2 viral copies this assay can detect is 250 copies / mL. A negative result does not preclude SARS-CoV-2 infection and should not be used as the sole basis for treatment or other patient management decisions.  A negative result may occur with improper specimen collection / handling, submission of specimen other than nasopharyngeal swab, presence of viral mutation(s) within the areas targeted by this assay, and inadequate number of viral copies (<250 copies / mL). A negative result must be combined with clinical observations, patient history, and epidemiological information.  Fact Sheet for Patients:   StrictlyIdeas.no  Fact Sheet for Healthcare Providers: BankingDealers.co.za  This test is not yet approved or  cleared by the [place] FDA and has been authorized for detection and/or diagnosis of SARS-CoV-2 by FDA under an  Emergency Use Authorization (EUA).  This EUA will remain in effect (meaning this test can be used) for the duration of the COVID-19 declaration under Section 564(b)(1) of the Act, 21 U.S.C. section 360bbb-3(b)(1), unless the authorization is terminated or revoked sooner.  Performed at [facility], [address] [place], [place] [address]   CULTURE, BLOOD (ROUTINE X 2) w Reflex to ID Panel     Status: Abnormal   Collection Time: 05/19/20  3:02 PM   Specimen: BLOOD  Result Value Ref Range Status   Specimen Description   Final    BLOOD RIGHT ANTECUBITAL Performed at [facility], [address] [place], [place] [address]    Special Requests   Final    BOTTLES DRAWN AEROBIC AND ANAEROBIC Blood Culture adequate volume Performed at [facility], [address] [place], [place] [address]    Culture  Setup Time   Final    GRAM NEGATIVE RODS IN BOTH AEROBIC AND ANAEROBIC BOTTLES CRITICAL RESULT CALLED TO, READ BACK BY AND VERIFIED WITH: Hart Robinsons [facility] [id number] 05/20/20 HNM Performed at [facility], [place] [address] [place], [place] [address]    Culture KLEBSIELLA PNEUMONIAE ESCHERICHIA COLI  (A)  Final   Report Status 05/23/2020 FINAL  Final   Organism ID, Bacteria KLEBSIELLA PNEUMONIAE  Final   Organism ID, Bacteria ESCHERICHIA COLI  Final      Susceptibility   Escherichia coli - MIC*    AMPICILLIN 8 SENSITIVE Sensitive     CEFAZOLIN <=4 SENSITIVE Sensitive     CEFEPIME <=0.12 SENSITIVE Sensitive     CEFTAZIDIME <=1 SENSITIVE Sensitive  CEFTRIAXONE <=0.25 SENSITIVE Sensitive     CIPROFLOXACIN <=0.25 SENSITIVE Sensitive     GENTAMICIN <=1 SENSITIVE Sensitive     IMIPENEM <=0.25 SENSITIVE Sensitive     TRIMETH/SULFA <=20 SENSITIVE Sensitive     AMPICILLIN/SULBACTAM <=2 SENSITIVE Sensitive     PIP/TAZO <=4 SENSITIVE Sensitive     * ESCHERICHIA COLI   Klebsiella pneumoniae - MIC*    AMPICILLIN RESISTANT Resistant     CEFAZOLIN <=4  SENSITIVE Sensitive     CEFEPIME <=0.12 SENSITIVE Sensitive     CEFTAZIDIME <=1 SENSITIVE Sensitive     CEFTRIAXONE <=0.25 SENSITIVE Sensitive     CIPROFLOXACIN <=0.25 SENSITIVE Sensitive     GENTAMICIN <=1 SENSITIVE Sensitive     IMIPENEM <=0.25 SENSITIVE Sensitive     TRIMETH/SULFA <=20 SENSITIVE Sensitive     AMPICILLIN/SULBACTAM 4 SENSITIVE Sensitive     PIP/TAZO <=4 SENSITIVE Sensitive     * KLEBSIELLA PNEUMONIAE  Blood Culture ID Panel (Reflexed)     Status: Abnormal   Collection Time: 05/19/20  3:02 PM  Result Value Ref Range Status   Enterococcus species NOT DETECTED NOT DETECTED Final   Listeria monocytogenes NOT DETECTED NOT DETECTED Final   Staphylococcus species NOT DETECTED NOT DETECTED Final   Staphylococcus aureus (BCID) NOT DETECTED NOT DETECTED Final   Streptococcus species NOT DETECTED NOT DETECTED Final   Streptococcus agalactiae NOT DETECTED NOT DETECTED Final   Streptococcus pneumoniae NOT DETECTED NOT DETECTED Final   Streptococcus pyogenes NOT DETECTED NOT DETECTED Final   Acinetobacter baumannii NOT DETECTED NOT DETECTED Final   Enterobacteriaceae species DETECTED (A) NOT DETECTED Final    Comment: CRITICAL RESULT CALLED TO, READ BACK BY AND VERIFIED WITH:  SCOTT HALL PHARMD 0335 05/20/20 HNM    Enterobacter cloacae complex NOT DETECTED NOT DETECTED Final   Escherichia coli DETECTED (A) NOT DETECTED Final    Comment: CRITICAL RESULT CALLED TO, READ BACK BY AND VERIFIED WITH: SCOTT HALL PHARMD 0335 05/20/20 HNM    Klebsiella oxytoca NOT DETECTED NOT DETECTED Final   Klebsiella pneumoniae DETECTED (A) NOT DETECTED Final    Comment: CRITICAL RESULT CALLED TO, READ BACK BY AND VERIFIED WITH: SCOTT HALL PHARMD 0335 05/20/20 HNM    Proteus species NOT DETECTED NOT DETECTED Final   Serratia marcescens NOT DETECTED NOT DETECTED Final   Carbapenem resistance NOT DETECTED NOT DETECTED Final   Haemophilus influenzae NOT DETECTED NOT DETECTED Final   Neisseria  meningitidis NOT DETECTED NOT DETECTED Final   Pseudomonas aeruginosa NOT DETECTED NOT DETECTED Final   Candida albicans NOT DETECTED NOT DETECTED Final   Candida glabrata NOT DETECTED NOT DETECTED Final   Candida krusei NOT DETECTED NOT DETECTED Final   Candida parapsilosis NOT DETECTED NOT DETECTED Final   Candida tropicalis NOT DETECTED NOT DETECTED Final    Comment: Performed at [facility], [address] [place], [place] [address]  CULTURE, BLOOD (ROUTINE X 2) w Reflex to ID Panel     Status: Abnormal   Collection Time: 05/19/20  3:14 PM   Specimen: BLOOD  Result Value Ref Range Status   Specimen Description   Final    BLOOD BLOOD RIGHT HAND Performed at [facility], [address] [place], [place] [address]    Special Requests   Final    BOTTLES DRAWN AEROBIC AND ANAEROBIC Blood Culture adequate volume Performed at [facility], [address], [place], [place] [address]    Culture  Setup Time   Final    Lonell Grandchild  NEGATIVE RODS IN BOTH AEROBIC AND ANAEROBIC BOTTLES CRITICAL VALUE NOTED.  VALUE IS CONSISTENT WITH PREVIOUSLY REPORTED AND CALLED VALUE. Performed at [facility], [address] [place], [place] [address]    Culture KLEBSIELLA PNEUMONIAE (A)  Final   Report Status 05/22/2020 FINAL  Final   Organism ID, Bacteria KLEBSIELLA PNEUMONIAE  Final      Susceptibility   Klebsiella pneumoniae - MIC*    AMPICILLIN RESISTANT Resistant     CEFAZOLIN <=4 SENSITIVE Sensitive     CEFEPIME <=0.12 SENSITIVE Sensitive     CEFTAZIDIME <=1 SENSITIVE Sensitive     CEFTRIAXONE <=0.25 SENSITIVE Sensitive     CIPROFLOXACIN <=0.25 SENSITIVE Sensitive     GENTAMICIN <=1 SENSITIVE Sensitive     IMIPENEM <=0.25 SENSITIVE Sensitive     TRIMETH/SULFA <=20 SENSITIVE Sensitive     AMPICILLIN/SULBACTAM 4 SENSITIVE Sensitive     PIP/TAZO <=4 SENSITIVE Sensitive     * KLEBSIELLA PNEUMONIAE  MRSA PCR Screening     Status: None   Collection Time:  05/21/20  5:24 PM   Specimen: Nasopharyngeal  Result Value Ref Range Status   MRSA by PCR NEGATIVE NEGATIVE Final    Comment:        The GeneXpert MRSA Assay (FDA approved for NASAL specimens only), is one component of a comprehensive MRSA colonization surveillance program. It is not intended to diagnose MRSA infection nor to guide or monitor treatment for MRSA infections. Performed at [facility], [address], [place], [place] [address]     Anti-infectives:  Anti-infectives (From admission, onward)   Start     Dose/Rate Route Frequency Ordered Stop   05/24/20 0900  piperacillin-tazobactam (ZOSYN) IVPB 2.25 g     Discontinue     2.25 g 100 mL/hr over 30 Minutes Intravenous Every 8 hours 05/24/20 0758     05/23/20 1300  piperacillin-tazobactam (ZOSYN) IVPB 3.375 g  Status:  Discontinued        3.375 g 12.5 mL/hr over 240 Minutes Intravenous Every 12 hours 05/23/20 1122 05/24/20 0758   05/20/20 0600  meropenem (MERREM) 1 g in sodium chloride 0.9 % 100 mL IVPB  Status:  Discontinued        1 g 200 mL/hr over 30 Minutes Intravenous Every 12 hours 05/20/20 0344 05/23/20 1122       Consults: Treatment Team:  [name], MD Teodoro Spray, MD Ottie Glazier, MD Murlean Iba, MD       PAST MEDICAL HISTORY   Past Medical History:  Diagnosis Date  . Aortic atherosclerosis ([place])    As seen on 04/2019 CT  . Atrial flutter ([place])    a. s/p ablation 2017/2018 and DCCV 2015/2016/2019 b. 2017 echo EF 60-65%, no RWMA, mild MR,  . CAD (coronary artery disease)    3v CAD 04/2019 CT  . Carcinoid tumor    metastatic  . CKD (chronic kidney disease), stage III   . COPD (chronic obstructive pulmonary disease) (HCC)    PM oxygen  . Current occasional smoker    1-2 cigarettes / week  . Diabetes mellitus type 2, insulin dependent ([place])   . Heart failure with preserved ejection fraction ([place])    2017 echo EF 60-65%, no RWMA, mild MR,    . Hyperlipidemia   .  Hypertension   . Pancreatitis   . Polycythemia      SURGICAL HISTORY   Past Surgical History:  Procedure Laterality Date  . A-FLUTTER ABLATION N/A 10/14/2017  Procedure: A-FLUTTER ABLATION;  Surgeon: Constance Haw, MD;  Location: [place] [facility];  Service: Cardiovascular;  Laterality: N/A;  . CARDIOVERSION N/A 09/08/2017   Procedure: CARDIOVERSION;  Surgeon: [name], MD;  Location: [facility] ORS;  Service: Cardiovascular;  Laterality: N/A;  . CARDIOVERSION N/A 03/22/2018   Procedure: CARDIOVERSION;  Surgeon: [name], MD;  Location: [facility] ORS;  Service: Cardiovascular;  Laterality: N/A;  . COLONOSCOPY    . COLONOSCOPY WITH PROPOFOL N/A 10/31/2019   Procedure: COLONOSCOPY WITH PROPOFOL;  Surgeon: Lin Landsman, MD;  Location: [facility] ENDOSCOPY;  Service: Gastroenterology;  Laterality: N/A;  . ELECTROPHYSIOLOGIC STUDY N/A 10/27/2016   Procedure: A-Flutter Ablation;  Surgeon: Deboraha Sprang, MD;  Location: [place] [facility] LAB;  Service: Cardiovascular;  Laterality: N/A;  . ENDOSCOPIC RETROGRADE CHOLANGIOPANCREATOGRAPHY (ERCP) WITH PROPOFOL N/A 05/21/2020   Procedure: ENDOSCOPIC RETROGRADE CHOLANGIOPANCREATOGRAPHY (ERCP) WITH PROPOFOL;  Surgeon: [name], MD;  Location: [facility] ENDOSCOPY;  Service: Endoscopy;  Laterality: N/A;  . EUS N/A 12/22/2017   Procedure: FULL UPPER ENDOSCOPIC ULTRASOUND (EUS) RADIAL;  Surgeon: Reita Cliche, MD;  Location: [facility] ENDOSCOPY;  Service: Gastroenterology;  Laterality: N/A;  . TEE WITH CARDIOVERSION  12/16  . TEMPORARY DIALYSIS CATHETER N/A 02/08/2020   Procedure: TEMPORARY DIALYSIS CATHETER;  Surgeon: Algernon Huxley, MD;  Location: [place] [facility] LAB;  Service: Cardiovascular;  Laterality: N/A;     FAMILY HISTORY   Family History  Problem Relation Age of Onset  . Hypertension Father      SOCIAL HISTORY   Social History   Tobacco Use  . Smoking status: Light Tobacco Smoker    Years: 45.00    Types: Cigarettes    Last  attempt to quit: 11/27/2012    Years since quitting: 7.4  . Smokeless tobacco: Never Used  Vaping Use  . Vaping Use: Never used  Substance Use Topics  . Alcohol use: Yes    Alcohol/week: 2.0 standard drinks    Types: 2 Cans of beer per week    Comment: beer every once a while once a monthnone last 24hrs  . Drug use: No     MEDICATIONS   Current Medication:  Current Facility-Administered Medications:  .  0.9 %  sodium chloride infusion, , Intravenous, PRN, Swayze, Ava, DO, Stopped at 05/20/20 0919 .  0.9 %  sodium chloride infusion, 250 mL, Intravenous, Continuous, Daysy Santini, MD .  albumin human 25 % solution 12.5 g, 12.5 g, Intravenous, Daily, Lanney Gins, Sanja Elizardo, MD, Last Rate: 60 mL/hr at 05/25/20 0954, 12.5 g at 05/25/20 0954 .  Chlorhexidine Gluconate Cloth 2 % PADS 6 each, 6 each, Topical, Daily, Ottie Glazier, MD, 6 each at 05/24/20 2143 .  hydrocortisone sodium succinate (SOLU-CORTEF) 100 MG injection 50 mg, 50 mg, Intravenous, Daily, Lanney Gins, Samyrah Bruster, MD, 50 mg at 05/25/20 0951 .  insulin aspart (novoLOG) injection 0-9 Units, 0-9 Units, Subcutaneous, Q4H, Darel Hong D, NP, 1 Units at 05/25/20 [phone] .  insulin glargine (LANTUS) injection 12 Units, 12 Units, Subcutaneous, QHS, Tu, Ching T, DO, 12 Units at 05/23/20 2133 .  levothyroxine (SYNTHROID) tablet 25 mcg, 25 mcg, Oral, q morning - 10a, Tu, Ching T, DO, 25 mcg at 05/25/20 0517 .  mometasone-formoterol (DULERA) 100-5 MCG/ACT inhaler 2 puff, 2 puff, Inhalation, BID, Tu, Ching T, DO, 2 puff at 05/25/20 0900 .  morphine 2 MG/ML injection 1 mg, 1 mg, Intravenous, Q2H PRN, Tu, Ching T, DO, 1 mg at 05/25/20 0041 .  ondansetron (ZOFRAN) injection 4 mg,  4 mg, Intravenous, Q6H PRN, Tu, Ching T, DO, 4 mg at 05/23/20 1854 .  pantoprazole (PROTONIX) injection 40 mg, 40 mg, Intravenous, Q24H, Dallie Piles, RPH, 40 mg at 05/24/20 1232 .  piperacillin-tazobactam (ZOSYN) IVPB 2.25 g, 2.25 g, Intravenous, Q8H, Dallie Piles, RPH,  Last Rate: 100 mL/hr at 05/25/20 0519, 2.25 g at 05/25/20 0519 .  potassium chloride 20 MEQ/15ML (10%) solution 20 mEq, 20 mEq, Oral, Once, Vallery Sa D, RPH    ALLERGIES   Levaquin [levofloxacin in d5w]    REVIEW OF SYSTEMS     Unable to obtain due to mechanical intubation   PHYSICAL EXAMINATION   Vital Signs: Temp:  [98.6 F (37 C)-99 F (37.2 C)] 98.8 F (37.1 C) (07/11 0400) Pulse Rate:  [69-106] 70 (07/11 0600) Resp:  [10-19] 10 (07/11 0600) BP: (100-120)/(57-80) 102/57 (07/11 0600) SpO2:  [93 %-100 %] 96 % (07/11 0600)  GENERAL:chronically ill appearing on [place] HEAD: Normocephalic, atraumatic.  EYES: Pupils equal, round, reactive to light.  No scleral icterus. Jaundiced MOUTH: Moist mucosal membrane. NECK: Supple. No thyromegaly. No nodules. No JVD.  PULMONARY: bilateral mild rhonchi CARDIOVASCULAR: S1 and S2. Regular rate and rhythm. No murmurs, rubs, or gallops.  GASTROINTESTINAL: Soft, nontender, non-distended. No masses. Positive bowel sounds. No hepatosplenomegaly.  MUSCULOSKELETAL: No swelling, clubbing, or edema.  NEUROLOGIC: GCS11 SKIN:intact,warm,dry   PERTINENT DATA     Infusions: . sodium chloride Stopped (05/20/20 0919)  . sodium chloride    . albumin human 12.5 g (05/25/20 0954)  . piperacillin-tazobactam (ZOSYN)  IV 2.25 g (05/25/20 0519)   Scheduled Medications: . Chlorhexidine Gluconate Cloth  6 each Topical Daily  . hydrocortisone sod succinate (SOLU-CORTEF) inj  50 mg Intravenous Daily  . insulin aspart  0-9 Units Subcutaneous Q4H  . insulin glargine  12 Units Subcutaneous QHS  . levothyroxine  25 mcg Oral q morning - 10a  . mometasone-formoterol  2 puff Inhalation BID  . pantoprazole (PROTONIX) IV  40 mg Intravenous Q24H  . potassium chloride  20 mEq Oral Once   PRN Medications: sodium chloride, morphine injection, ondansetron (ZOFRAN) IV Hemodynamic parameters: CVP:  [7 mmHg-10 mmHg] 7 mmHg Intake/Output: 07/10 0701 - 07/11  0700 In: 1412.8 [P.O.:250; I.V.:812.8; IV Piggyback:350] Out: 2090 [Urine:2090]  Ventilator  Settings:      LAB RESULTS:  Basic Metabolic Panel: Recent Labs  Lab 05/22/20 0526 05/22/20 0526 05/22/20 0928 05/22/20 0928 05/23/20 0416 05/23/20 0416 05/24/20 0430 05/25/20 0330  NA 137  --  134*  --  137  --  138 138  K 4.8   < > 4.6   < > 4.2   < > 3.9 3.9  CL 106  --  104  --  101  --  98 97*  CO2 20*  --  18*  --  25  --  27 25  GLUCOSE 157*  --  159*  --  121*  --  81 143*  BUN 70*  --  72*  --  79*  --  84* 90*  CREATININE 4.00*  --  4.04*  --  4.29*  --  4.55* 4.95*  CALCIUM 6.9*  --  6.8*  --  6.7*  --  6.9* 6.9*  MG 1.6*  --   --   --  2.0  --  2.1 2.2  PHOS  --   --   --   --  6.7*  --   --  8.4*   < > = values  in this interval not displayed.   Liver Function Tests: Recent Labs  Lab 05/20/20 0439 05/20/20 0439 05/21/20 0741 05/22/20 0526 05/23/20 0416 05/24/20 0430 05/25/20 0330  AST 94*  --  186* 357* 536* 666*  --   ALT 130*  --  161* 231* 298* 381*  --   ALKPHOS 591*  --  650* 798* 889* 989*  --   BILITOT 6.1*  --  8.2* 9.1* 9.7* 12.1*  --   PROT 4.5*  --  4.5* 4.5* 4.3* 5.1*  --   ALBUMIN 2.0*   < > 2.0* 1.9* 1.9* 2.3* 2.0*   < > = values in this interval not displayed.   Recent Labs  Lab 05/18/20 1747 05/19/20 1502 05/22/20 1036  LIPASE 100* 19 22   No results for input(s): AMMONIA in the last 168 hours. CBC: Recent Labs  Lab 05/21/20 0741 05/21/20 1850 05/22/20 0526 05/23/20 0416 05/24/20 0430  WBC 25.8* 46.9* 21.4* 18.7* 21.4*  NEUTROABS 19.6*  --  19.1* 15.7* 17.4*  HGB 8.8* 11.0* 8.5* 8.1* 8.7*  HCT 27.3* 33.4* 25.9* 25.0* 26.4*  MCV 92.9 91.3 91.8 91.2 90.4  PLT 52* 81* 66* 68* 65*   Cardiac Enzymes: No results for input(s): CKTOTAL, CKMB, CKMBINDEX, TROPONINI in the last 168 hours. BNP: Invalid input(s): POCBNP CBG: Recent Labs  Lab 05/24/20 1946 05/24/20 2141 05/24/20 2335 05/25/20 0350 05/25/20 0754  GLUCAP 109*  106* 132* 148* 140*       IMAGING RESULTS:  Imaging: No results found. _0 @ No results found.      ASSESSMENT AND PLAN    -Multidisciplinary rounds held today  Atypical flutter with rapid ventricular response-resolved  -s/p amiodarone gtt due to refactory RVR post esmolol and metoprolol -cardiology is on case appreciate input  -improved now off of amiodarone  Cholangitis with obstuctive jaundice S/p ERCP with post operative failure to wean from MV -s/p extubation  ICU telemetry monitoring ID is on case - appreciate input - continue meropenem -patient with progressively worsening LFTs -05/24/20   Chronic cardiac comorbidities  atrial flutter s/p DCCV x 3 and ablation in 2017/2018 -on eliquis chronically - coronary artery disease  - HFpEF   COPD with chronic hypoxemia -hold DuoNEB while patient is in AFL -consider steroids since patient is hypotensive   Acute on chronic renal failure stage 3 - d/c non-essential nephrotoxic medications -nephrology consultation   Severe protein calorie malnutrition - bitemporal and peripheral muscle wasting -albumin <2 Dietary nutritional consultation   Septic shock - -shock physiology resolved but sepsis persists  - due to bacteremia with klebsiella and Ecoli -present on admission  -c/w meropenem -judicious use of IVF due to bilateral pulm interstitial edema   Biliary obstruction with jaundice   -discussed with surgery team today - patient is unstable for any surgical intervention at this time and is not surgical candidate.   ID -continue IV abx as prescibed -follow up cultures  GI/Nutrition GI PROPHYLAXIS as indicated DIET-->TF's as tolerated Constipation protocol as indicated  ENDO - ICU hypoglycemic\Hyperglycemia protocol -check FSBS per protocol   ELECTROLYTES -follow labs as needed -replace as needed -pharmacy consultation   DVT/GI PRX ordered -SCDs  TRANSFUSIONS AS NEEDED MONITOR  FSBS ASSESS the need for LABS as needed   Critical care provider statement:    Critical care time (minutes):  33   Critical care time was exclusive of:  Separately billable procedures and treating other patients   Critical care was necessary to treat or prevent imminent or  life-threatening deterioration of the following conditions:  septic shock, cholangitis, AFL with RVR, multiple comorbid conditions   Critical care was time spent personally by me on the following activities:  Development of treatment plan with patient or surrogate, discussions with consultants, evaluation of patient's response to treatment, examination of patient, obtaining history from patient or surrogate, ordering and performing treatments and interventions, ordering and review of laboratory studies and re-evaluation of patient's condition.  I assumed direction of critical care for this patient from another provider in my specialty: no    This document was prepared using Dragon voice recognition software and may include unintentional dictation errors.    Ottie Glazier, M.D.  Division of [place]

## 2020-05-25 NOTE — Plan of Care (Signed)
  Problem: Health Behavior/Discharge Planning: Goal: Ability to manage health-related needs will improve Outcome: Progressing   Problem: Clinical Measurements: Goal: Ability to maintain clinical measurements within normal limits will improve Outcome: Progressing Goal: Will remain free from infection Outcome: Progressing Goal: Diagnostic test results will improve Outcome: Progressing Goal: Respiratory complications will improve Outcome: Progressing Goal: Cardiovascular complication will be avoided Outcome: Progressing   Problem: Activity: Goal: Risk for activity intolerance will decrease Outcome: Progressing   Problem: Nutrition: Goal: Adequate nutrition will be maintained Outcome: Progressing   Problem: Coping: Goal: Level of anxiety will decrease Outcome: Progressing   Problem: Pain Managment: Goal: General experience of comfort will improve Outcome: Progressing   Problem: Safety: Goal: Ability to remain free from injury will improve Outcome: Progressing   Problem: Skin Integrity: Goal: Risk for impaired skin integrity will decrease Outcome: Progressing   Problem: Activity: Goal: Ability to implement measures to reduce episodes of fatigue will improve Outcome: Progressing   Problem: Coping: Goal: Ability to identify and develop effective coping behavior will improve Outcome: Progressing   Problem: Nutritional: Goal: Maintenance of adequate nutrition will improve Outcome: Progressing

## 2020-05-25 NOTE — Consult Note (Signed)
PHARMACY CONSULT NOTE  Pharmacy Consult for Electrolyte Monitoring and Replacement   Recent Labs: Potassium (mmol/L)  Date Value  05/25/2020 3.9  11/01/2014 3.8   Magnesium (mg/dL)  Date Value  05/25/2020 2.2  10/30/2014 1.8   Calcium (mg/dL)  Date Value  05/25/2020 6.9 (L)   Calcium, Total (mg/dL)  Date Value  11/01/2014 9.4   Albumin (g/dL)  Date Value  05/25/2020 2.0 (L)  10/29/2014 3.0 (L)   Phosphorus (mg/dL)  Date Value  05/25/2020 8.4 (H)   Sodium (mmol/L)  Date Value  05/25/2020 138  10/11/2017 140  11/01/2014 138   Corrected Ca: 8.5 mg/dL  Assessment: 74 y.o. male with a history of CKD, COPD, atrial flutter s/p DCCV x 3 and ablation in 2017/2018, anticoagulated with Eliquis, CAD, diabetes, pancreatic cancer, recently diagnosed who presents with epigastric abdominal pain now with E coli and Klebsiella bacteremia.   Goal of Therapy:  Potassium 4.0 - 5.1 mmol/L Magnesium 2.0 - 2.4 mg/dL All Other Electrolytes WNL  Plan:   20 mEq oral KCl x 1  F/U electrolytes in am 7/12  Dallie Piles ,PharmD Clinical Pharmacist 05/25/2020 8:59 AM

## 2020-05-25 NOTE — Progress Notes (Signed)
Patient transferred to room 224 at this time.  Niece, Alfreda, notified via telephone.  Confirmed with her that she does have patient's cell phone.

## 2020-05-25 NOTE — Progress Notes (Signed)
Patient transferred from ICU. Telemetry continued

## 2020-05-25 NOTE — Progress Notes (Signed)
Order received from Dr Lanney Gins to continue telemetry and change CBG's to [facility] and HS

## 2020-05-25 NOTE — Progress Notes (Signed)
[address] [place], [place] [address] Phone [phone]. Fax [phone]  Date: 05/25/2020                  Patient Name:  [name]  MRN: [id number]  DOB: Jul 31, 1946  Age / Sex: 74 y.o., male         PCP: [name], MD                 Service Requesting Consult: IM/ Ottie Glazier, MD                 Reason for Consult: ARF            History of Present Illness: Patient is a 74 y.o. male with medical problems of Pancreatic neuroendocrine tumor, chronic pancreatitis, atrial flutter status post ablation, requiring anticoagulation with Eliquis, hypertension, coronary disease, COPD, insulin-dependent type 2 diabetes, CKD stage IIIb, who was admitted to [facility] on 05/18/2020 for evaluation of Epigastric pain [R10.13] Abdominal pain [R10.9] Malignant neoplasm of pancreas, unspecified location of malignancy ([place]) [C25.9]  Patient has baseline chronic diffuse abdominal pain from his pancreatic cancer but came in for acute worsening with nausea and vomiting. Nephrology consult has been requested for evaluation of acute renal failure Patient is currently intubated and on multiple pressors.  He is not able to provide any meaningful information.  All information is obtained from the chart and primary team.  05/25/20 update:  Good UOP at 2 liters over past 24 hours. Feeling better post extubation.  07/10 0701 - 07/11 0700 In: 1412.8 [P.O.:250; I.V.:812.8; IV Piggyback:350] Out: 2090 [Urine:2090]   Current medications: Current Facility-Administered Medications  Medication Dose Route Frequency Provider Last Rate Last Admin   0.9 %  sodium chloride infusion   Intravenous PRN Swayze, Ava, DO   Stopped at 05/20/20 0919   0.9 %  sodium chloride infusion  250 mL Intravenous Continuous Aleskerov, Fuad, MD       albumin human 25 % solution 12.5 g  12.5 g Intravenous Daily Ottie Glazier, MD   Paused at 05/25/20 1054   Chlorhexidine Gluconate Cloth 2 % PADS 6 each   6 each Topical Daily Ottie Glazier, MD   6 each at 05/24/20 2143   hydrocortisone sodium succinate (SOLU-CORTEF) 100 MG injection 50 mg  50 mg Intravenous Daily Ottie Glazier, MD   50 mg at 05/25/20 0951   insulin aspart (novoLOG) injection 0-9 Units  0-9 Units Subcutaneous Q4H Darel Hong D, NP   1 Units at 05/25/20 1207   insulin glargine (LANTUS) injection 12 Units  12 Units Subcutaneous QHS Tu, Ching T, DO   12 Units at 05/23/20 2133   levothyroxine (SYNTHROID) tablet 25 mcg  25 mcg Oral q morning - 10a Tu, Ching T, DO   25 mcg at 05/25/20 0517   mometasone-formoterol (DULERA) 100-5 MCG/ACT inhaler 2 puff  2 puff Inhalation BID Tu, Ching T, DO   2 puff at 05/25/20 0900   morphine 2 MG/ML injection 1 mg  1 mg Intravenous Q2H PRN Tu, Ching T, DO   1 mg at 05/25/20 0041   ondansetron (ZOFRAN) injection 4 mg  4 mg Intravenous Q6H PRN Tu, Ching T, DO   4 mg at 05/23/20 1854   pantoprazole (PROTONIX) injection 40 mg  40 mg Intravenous Q24H Dallie Piles, RPH   40 mg at 05/25/20 1208   piperacillin-tazobactam (ZOSYN) IVPB 2.25 g  2.25 g Intravenous Q8H Dallie Piles, RPH 80 mL/hr at 05/25/20 1403  2.25 g at 05/25/20 1403     Vital Signs: Blood pressure 106/60, pulse 76, temperature 98.4 F (36.9 C), temperature source Oral, resp. rate 13, height 5\' 7"  (1.702 m), weight 63.9 kg, SpO2 97 %.   Intake/Output Summary (Last 24 hours) at 05/25/2020 1439 Last data filed at 05/25/2020 1330 Gross per 24 hour  Intake 1741.97 ml  Output 1710 ml  Net 31.97 ml    Weight trends: Filed Weights   05/18/20 1745 05/19/20 0206  Weight: 65.8 kg 63.9 kg    Physical Exam: General:  No acute distress  HEENT  scleral icterus noted, oral mucosa moist  Heart::  Regular, no rubs  Abdomen:  Soft, nondistended  Extremities:  1+ dependent edema  Neurologic:  Following simple commands  Skin:  Jaundice  Foley:  In place       Lab results: Basic Metabolic Panel: Recent Labs  Lab  05/23/20 0416 05/24/20 0430 05/25/20 0330  NA 137 138 138  K 4.2 3.9 3.9  CL 101 98 97*  CO2 25 27 25   GLUCOSE 121* 81 143*  BUN 79* 84* 90*  CREATININE 4.29* 4.55* 4.95*  CALCIUM 6.7* 6.9* 6.9*  MG 2.0 2.1 2.2  PHOS 6.7*  --  8.4*    Liver Function Tests: Recent Labs  Lab 05/24/20 0430 05/24/20 0430 05/25/20 0330  AST 666*  --   --   ALT 381*  --   --   ALKPHOS 989*  --   --   BILITOT 12.1*  --   --   PROT 5.1*  --   --   ALBUMIN 2.3*   < > 2.0*   < > = values in this interval not displayed.   Recent Labs  Lab 05/22/20 1036  LIPASE 22   No results for input(s): AMMONIA in the last 168 hours.  CBC: Recent Labs  Lab 05/23/20 0416 05/24/20 0430  WBC 18.7* 21.4*  NEUTROABS 15.7* 17.4*  HGB 8.1* 8.7*  HCT 25.0* 26.4*  MCV 91.2 90.4  PLT 68* 65*    Cardiac Enzymes: No results for input(s): CKTOTAL, TROPONINI in the last 168 hours.  BNP: Invalid input(s): POCBNP  CBG: Recent Labs  Lab 05/24/20 1946 05/24/20 2141 05/24/20 2335 05/25/20 0350 05/25/20 0754  GLUCAP 109* 106* 132* 148* 140*    Microbiology: Recent Results (from the past 720 hour(s))  SARS Coronavirus 2 by RT PCR (hospital order, performed in [facility] hospital lab) Nasopharyngeal Urine, Clean Catch     Status: None   Collection Time: 05/18/20 11:30 PM   Specimen: Urine, Clean Catch; Nasopharyngeal  Result Value Ref Range Status   SARS Coronavirus 2 NEGATIVE NEGATIVE Final    Comment: (NOTE) SARS-CoV-2 target nucleic acids are NOT DETECTED.  The SARS-CoV-2 RNA is generally detectable in upper and lower respiratory specimens during the acute phase of infection. The lowest concentration of SARS-CoV-2 viral copies this assay can detect is 250 copies / mL. A negative result does not preclude SARS-CoV-2 infection and should not be used as the sole basis for treatment or other patient management decisions.  A negative result may occur with improper specimen collection / handling,  submission of specimen other than nasopharyngeal swab, presence of viral mutation(s) within the areas targeted by this assay, and inadequate number of viral copies (<250 copies / mL). A negative result must be combined with clinical observations, patient history, and epidemiological information.  Fact Sheet for Patients:   StrictlyIdeas.no  Fact Sheet for Healthcare Providers: BankingDealers.co.za  This test is not yet approved or  cleared by the [place] and has been authorized for detection and/or diagnosis of SARS-CoV-2 by FDA under an Emergency Use Authorization (EUA).  This EUA will remain in effect (meaning this test can be used) for the duration of the COVID-19 declaration under Section 564(b)(1) of the Act, 21 U.S.C. section 360bbb-3(b)(1), unless the authorization is terminated or revoked sooner.  Performed at [facility], [address] [place], [place] [address]   CULTURE, BLOOD (ROUTINE X 2) w Reflex to ID Panel     Status: Abnormal   Collection Time: 05/19/20  3:02 PM   Specimen: BLOOD  Result Value Ref Range Status   Specimen Description   Final    BLOOD RIGHT ANTECUBITAL Performed at [facility], [address] [place], [place] [address]    Special Requests   Final    BOTTLES DRAWN AEROBIC AND ANAEROBIC Blood Culture adequate volume Performed at [facility], [address], [place], [place] [address]    Culture  Setup Time   Final    GRAM NEGATIVE RODS IN BOTH AEROBIC AND ANAEROBIC BOTTLES CRITICAL RESULT CALLED TO, READ BACK BY AND VERIFIED WITH: Hart Robinsons [facility] [id number] 05/20/20 HNM Performed at [facility], [address] [place], [place] [address]    Culture KLEBSIELLA PNEUMONIAE ESCHERICHIA COLI  (A)  Final   Report Status 05/23/2020 FINAL  Final   Organism ID, Bacteria KLEBSIELLA PNEUMONIAE  Final   Organism ID, Bacteria ESCHERICHIA COLI  Final       Susceptibility   Escherichia coli - MIC*    AMPICILLIN 8 SENSITIVE Sensitive     CEFAZOLIN <=4 SENSITIVE Sensitive     CEFEPIME <=0.12 SENSITIVE Sensitive     CEFTAZIDIME <=1 SENSITIVE Sensitive     CEFTRIAXONE <=0.25 SENSITIVE Sensitive     CIPROFLOXACIN <=0.25 SENSITIVE Sensitive     GENTAMICIN <=1 SENSITIVE Sensitive     IMIPENEM <=0.25 SENSITIVE Sensitive     TRIMETH/SULFA <=20 SENSITIVE Sensitive     AMPICILLIN/SULBACTAM <=2 SENSITIVE Sensitive     PIP/TAZO <=4 SENSITIVE Sensitive     * ESCHERICHIA COLI   Klebsiella pneumoniae - MIC*    AMPICILLIN RESISTANT Resistant     CEFAZOLIN <=4 SENSITIVE Sensitive     CEFEPIME <=0.12 SENSITIVE Sensitive     CEFTAZIDIME <=1 SENSITIVE Sensitive     CEFTRIAXONE <=0.25 SENSITIVE Sensitive     CIPROFLOXACIN <=0.25 SENSITIVE Sensitive     GENTAMICIN <=1 SENSITIVE Sensitive     IMIPENEM <=0.25 SENSITIVE Sensitive     TRIMETH/SULFA <=20 SENSITIVE Sensitive     AMPICILLIN/SULBACTAM 4 SENSITIVE Sensitive     PIP/TAZO <=4 SENSITIVE Sensitive     * KLEBSIELLA PNEUMONIAE  Blood Culture ID Panel (Reflexed)     Status: Abnormal   Collection Time: 05/19/20  3:02 PM  Result Value Ref Range Status   Enterococcus species NOT DETECTED NOT DETECTED Final   Listeria monocytogenes NOT DETECTED NOT DETECTED Final   Staphylococcus species NOT DETECTED NOT DETECTED Final   Staphylococcus aureus (BCID) NOT DETECTED NOT DETECTED Final   Streptococcus species NOT DETECTED NOT DETECTED Final   Streptococcus agalactiae NOT DETECTED NOT DETECTED Final   Streptococcus pneumoniae NOT DETECTED NOT DETECTED Final   Streptococcus pyogenes NOT DETECTED NOT DETECTED Final   Acinetobacter baumannii NOT DETECTED NOT DETECTED Final   Enterobacteriaceae species DETECTED (A) NOT DETECTED Final    Comment: CRITICAL RESULT CALLED TO, READ BACK BY AND  VERIFIED WITH:  Hart Robinsons PHARMD [id number] 05/20/20 HNM    Enterobacter cloacae complex NOT DETECTED NOT DETECTED Final    Escherichia coli DETECTED (A) NOT DETECTED Final    Comment: CRITICAL RESULT CALLED TO, READ BACK BY AND VERIFIED WITH: SCOTT HALL PHARMD [id number] 05/20/20 HNM    Klebsiella oxytoca NOT DETECTED NOT DETECTED Final   Klebsiella pneumoniae DETECTED (A) NOT DETECTED Final    Comment: CRITICAL RESULT CALLED TO, READ BACK BY AND VERIFIED WITH: SCOTT HALL PHARMD 0335 05/20/20 HNM    Proteus species NOT DETECTED NOT DETECTED Final   Serratia marcescens NOT DETECTED NOT DETECTED Final   Carbapenem resistance NOT DETECTED NOT DETECTED Final   Haemophilus influenzae NOT DETECTED NOT DETECTED Final   Neisseria meningitidis NOT DETECTED NOT DETECTED Final   Pseudomonas aeruginosa NOT DETECTED NOT DETECTED Final   Candida albicans NOT DETECTED NOT DETECTED Final   Candida glabrata NOT DETECTED NOT DETECTED Final   Candida krusei NOT DETECTED NOT DETECTED Final   Candida parapsilosis NOT DETECTED NOT DETECTED Final   Candida tropicalis NOT DETECTED NOT DETECTED Final    Comment: Performed at [facility], [address] [place], [place] [address]  CULTURE, BLOOD (ROUTINE X 2) w Reflex to ID Panel     Status: Abnormal   Collection Time: 05/19/20  3:14 PM   Specimen: BLOOD  Result Value Ref Range Status   Specimen Description   Final    BLOOD BLOOD RIGHT HAND Performed at [facility], [address] [place], [place] [address]    Special Requests   Final    BOTTLES DRAWN AEROBIC AND ANAEROBIC Blood Culture adequate volume Performed at [facility], [address] [place], [place] [address]    Culture  Setup Time   Final    GRAM NEGATIVE RODS IN BOTH AEROBIC AND ANAEROBIC BOTTLES CRITICAL VALUE NOTED.  VALUE IS CONSISTENT WITH PREVIOUSLY REPORTED AND CALLED VALUE. Performed at [facility], [address] [place], [place] [address]    Culture KLEBSIELLA PNEUMONIAE (A)  Final   Report Status 05/22/2020 FINAL  Final   Organism ID, Bacteria KLEBSIELLA  PNEUMONIAE  Final      Susceptibility   Klebsiella pneumoniae - MIC*    AMPICILLIN RESISTANT Resistant     CEFAZOLIN <=4 SENSITIVE Sensitive     CEFEPIME <=0.12 SENSITIVE Sensitive     CEFTAZIDIME <=1 SENSITIVE Sensitive     CEFTRIAXONE <=0.25 SENSITIVE Sensitive     CIPROFLOXACIN <=0.25 SENSITIVE Sensitive     GENTAMICIN <=1 SENSITIVE Sensitive     IMIPENEM <=0.25 SENSITIVE Sensitive     TRIMETH/SULFA <=20 SENSITIVE Sensitive     AMPICILLIN/SULBACTAM 4 SENSITIVE Sensitive     PIP/TAZO <=4 SENSITIVE Sensitive     * KLEBSIELLA PNEUMONIAE  MRSA PCR Screening     Status: None   Collection Time: 05/21/20  5:24 PM   Specimen: Nasopharyngeal  Result Value Ref Range Status   MRSA by PCR NEGATIVE NEGATIVE Final    Comment:        The GeneXpert MRSA Assay (FDA approved for NASAL specimens only), is one component of a comprehensive MRSA colonization surveillance program. It is not intended to diagnose MRSA infection nor to guide or monitor treatment for MRSA infections. Performed at [facility], [address] [place], [place] [address]      Coagulation Studies: No results for input(s): LABPROT, INR in the last 72 hours.  Urinalysis: No results for input(s): COLORURINE, LABSPEC, [place],  GLUCOSEU, HGBUR, BILIRUBINUR, KETONESUR, PROTEINUR, UROBILINOGEN, NITRITE, LEUKOCYTESUR in the last 72 hours.  Invalid input(s): APPERANCEUR      Imaging: No results found.   Assessment & Plan: Pt is a 74 y.o.  medical problems of Pancreatic neuroendocrine tumor, chronic pancreatitis, atrial flutter status post ablation, requiring anticoagulation with Eliquis, hypertension, coronary disease, COPD, insulin-dependent type 2 diabetes, CKD stage IIIb,   male with , was admitted on 05/18/2020 with Epigastric pain [R10.13] Abdominal pain [R10.9] Malignant neoplasm of pancreas, unspecified location of malignancy ([place]) [C25.9]  #.  Acute kidney injury on chronic kidney disease stage  IV Baseline creatinine 2.6/GFR 27  Urinalysis (July 4) proteinuria 100 mg/dL, 0-5 RBCs, 0-5 WBC Imaging: July 5 CT abdomen: Pancreatitis, pancreatic pseudocysts, bile duct distention, left adrenal mass, mild perinephric stranding on the left.  No hydronephrosis  Acute kidney injury is likely secondary to concurrent illness, sepsis leading to ATN  UOP 2 L over the preceding 24 hours.  Therefore no indication for dialysis.  Continue to monitor renal parameters daily.  #Acute respiratory failure Patient continues to do quite well post extubation.  Breathing comfortably.  #Sepsis Klebsiella bacteremia from July 5.   Currently on Zosyn.  #Pancreatic neuroendocrine tumor with pseudocyst, worsening biliary duct dilatation,  patient underwent ERCP on July 7 where occluded stent and purulent drainage in the common bile duct He underwent pus drainage from the biliary tree, stent placement in the common bile duct 7/7    LOS: 6 Vasilis Luhman 7/11/20212:39 PM    Note: This note was prepared with Dragon dictation. Any transcription errors are unintentional

## 2020-05-26 DIAGNOSIS — N170 Acute kidney failure with tubular necrosis: Secondary | ICD-10-CM

## 2020-05-26 DIAGNOSIS — E43 Unspecified severe protein-calorie malnutrition: Secondary | ICD-10-CM

## 2020-05-26 DIAGNOSIS — R6521 Severe sepsis with septic shock: Secondary | ICD-10-CM

## 2020-05-26 LAB — CBC WITH DIFFERENTIAL/PLATELET
Abs Immature Granulocytes: 2.75 10*3/uL — ABNORMAL HIGH (ref 0.00–0.07)
Basophils Absolute: 0 10*3/uL (ref 0.0–0.1)
Basophils Relative: 0 %
Eosinophils Absolute: 0 10*3/uL (ref 0.0–0.5)
Eosinophils Relative: 0 %
HCT: 23.7 % — ABNORMAL LOW (ref 39.0–52.0)
Hemoglobin: 7.6 g/dL — ABNORMAL LOW (ref 13.0–17.0)
Immature Granulocytes: 19 %
Lymphocytes Relative: 4 %
Lymphs Abs: 0.6 10*3/uL — ABNORMAL LOW (ref 0.7–4.0)
MCH: 29.6 pg (ref 26.0–34.0)
MCHC: 32.1 g/dL (ref 30.0–36.0)
MCV: 92.2 fL (ref 80.0–100.0)
Monocytes Absolute: 1.2 10*3/uL — ABNORMAL HIGH (ref 0.1–1.0)
Monocytes Relative: 8 %
Neutro Abs: 10.3 10*3/uL — ABNORMAL HIGH (ref 1.7–7.7)
Neutrophils Relative %: 69 %
Platelets: 63 10*3/uL — ABNORMAL LOW (ref 150–400)
RBC: 2.57 MIL/uL — ABNORMAL LOW (ref 4.22–5.81)
RDW: 13.9 % (ref 11.5–15.5)
Smear Review: NORMAL
WBC: 14.8 10*3/uL — ABNORMAL HIGH (ref 4.0–10.5)
nRBC: 0.5 % — ABNORMAL HIGH (ref 0.0–0.2)

## 2020-05-26 LAB — COMPREHENSIVE METABOLIC PANEL
ALT: 219 U/L — ABNORMAL HIGH (ref 0–44)
AST: 200 U/L — ABNORMAL HIGH (ref 15–41)
Albumin: 2.2 g/dL — ABNORMAL LOW (ref 3.5–5.0)
Alkaline Phosphatase: 646 U/L — ABNORMAL HIGH (ref 38–126)
Anion gap: 12 (ref 5–15)
BUN: 92 mg/dL — ABNORMAL HIGH (ref 8–23)
CO2: 26 mmol/L (ref 22–32)
Calcium: 7.2 mg/dL — ABNORMAL LOW (ref 8.9–10.3)
Chloride: 94 mmol/L — ABNORMAL LOW (ref 98–111)
Creatinine, Ser: 5.06 mg/dL — ABNORMAL HIGH (ref 0.61–1.24)
GFR calc Af Amer: 12 mL/min — ABNORMAL LOW (ref >60)
GFR calc non Af Amer: 10 mL/min — ABNORMAL LOW (ref >60)
Glucose, Bld: 243 mg/dL — ABNORMAL HIGH (ref 70–99)
Potassium: 3.4 mmol/L — ABNORMAL LOW (ref 3.5–5.1)
Sodium: 132 mmol/L — ABNORMAL LOW (ref 135–145)
Total Bilirubin: 14 mg/dL — ABNORMAL HIGH (ref 0.3–1.2)
Total Protein: 4.6 g/dL — ABNORMAL LOW (ref 6.5–8.1)

## 2020-05-26 LAB — RENAL FUNCTION PANEL
Albumin: 2 g/dL — ABNORMAL LOW (ref 3.5–5.0)
Anion gap: 12 (ref 5–15)
BUN: 94 mg/dL — ABNORMAL HIGH (ref 8–23)
CO2: 27 mmol/L (ref 22–32)
Calcium: 6.9 mg/dL — ABNORMAL LOW (ref 8.9–10.3)
Chloride: 93 mmol/L — ABNORMAL LOW (ref 98–111)
Creatinine, Ser: 4.86 mg/dL — ABNORMAL HIGH (ref 0.61–1.24)
GFR calc Af Amer: 13 mL/min — ABNORMAL LOW (ref >60)
GFR calc non Af Amer: 11 mL/min — ABNORMAL LOW (ref >60)
Glucose, Bld: 164 mg/dL — ABNORMAL HIGH (ref 70–99)
Phosphorus: UNDETERMINED mg/dL (ref 2.5–4.6)
Potassium: 3.3 mmol/L — ABNORMAL LOW (ref 3.5–5.1)
Sodium: 132 mmol/L — ABNORMAL LOW (ref 135–145)

## 2020-05-26 LAB — GLUCOSE, CAPILLARY
Glucose-Capillary: 155 mg/dL — ABNORMAL HIGH (ref 70–99)
Glucose-Capillary: 238 mg/dL — ABNORMAL HIGH (ref 70–99)
Glucose-Capillary: 282 mg/dL — ABNORMAL HIGH (ref 70–99)
Glucose-Capillary: 220 mg/dL — ABNORMAL HIGH (ref 70–99)

## 2020-05-26 LAB — BILIRUBIN, DIRECT: Bilirubin, Direct: 9.1 mg/dL — ABNORMAL HIGH (ref 0.0–0.2)

## 2020-05-26 LAB — MAGNESIUM: Magnesium: 2 mg/dL (ref 1.7–2.4)

## 2020-05-26 MED ORDER — GLUCERNA SHAKE PO LIQD
237.0000 mL | Freq: Three times a day (TID) | ORAL | Status: DC
  Administered 2020-05-26 – 2020-05-29 (×9): 237 mL via ORAL

## 2020-05-26 MED ORDER — SODIUM CHLORIDE 0.9 % IV SOLN
3.0000 g | INTRAVENOUS | Status: DC
  Administered 2020-05-26 – 2020-05-27 (×2): 3 g via INTRAVENOUS
  Filled 2020-05-26: qty 3
  Filled 2020-05-26: qty 8
  Filled 2020-05-26: qty 3

## 2020-05-26 MED ORDER — POTASSIUM CHLORIDE CRYS ER 20 MEQ PO TBCR
40.0000 meq | EXTENDED_RELEASE_TABLET | Freq: Three times a day (TID) | ORAL | Status: AC
  Administered 2020-05-26 (×2): 40 meq via ORAL
  Filled 2020-05-26 (×2): qty 2

## 2020-05-26 MED ORDER — PANTOPRAZOLE SODIUM 40 MG PO TBEC
40.0000 mg | DELAYED_RELEASE_TABLET | Freq: Every day | ORAL | Status: DC
  Administered 2020-05-26 – 2020-05-29 (×4): 40 mg via ORAL
  Filled 2020-05-26 (×4): qty 1

## 2020-05-26 MED ORDER — TRAMADOL HCL 50 MG PO TABS: 50.0000 mg | ORAL_TABLET | Freq: Four times a day (QID) | ORAL | Status: DC | PRN

## 2020-05-26 NOTE — Progress Notes (Signed)
[address] [place], [place] [address] Phone [phone]. Fax [phone]  Date: 05/26/2020                  Patient Name:  [name]  MRN: [id number]  DOB: 07-Mar-1946  Age / Sex: 74 y.o., male         PCP: [name], MD                 Service Requesting Consult: IM/ Fritzi Mandes, MD                 Reason for Consult: ARF            History of Present Illness: Patient is a 74 y.o. male with medical problems of Pancreatic neuroendocrine tumor, chronic pancreatitis, atrial flutter status post ablation, requiring anticoagulation with Eliquis, hypertension, coronary disease, COPD, insulin-dependent type 2 diabetes, CKD stage IIIb, who was admitted to [facility] on 05/18/2020 for evaluation of Epigastric pain [R10.13] Abdominal pain [R10.9] Malignant neoplasm of pancreas, unspecified location of malignancy ([place]) [C25.9]  Patient has baseline chronic diffuse abdominal pain from his pancreatic cancer but came in for acute worsening with nausea and vomiting. Nephrology consult has been requested for evaluation of acute renal failure Patient is currently intubated and on multiple pressors.  He is not able to provide any meaningful information.  All information is obtained from the chart and primary team.  05/26/2020 update: Patient transition to floor care. Urine output was 1.3 L over the preceding 24 hours. Creatinine up slightly to 5.06. Patient awake, alert, and conversant.  07/11 0701 - 07/12 0700 In: 569.2 [P.O.:480; IV Piggyback:89.2] Out: 1310 [Urine:1310]   Current medications: Current Facility-Administered Medications  Medication Dose Route Frequency Provider Last Rate Last Admin   0.9 %  sodium chloride infusion   Intravenous PRN Swayze, Ava, DO 5 mL/hr at 05/26/20 1400 Rate Verify at 05/26/20 1400   0.9 %  sodium chloride infusion  250 mL Intravenous Continuous Aleskerov, Fuad, MD       albumin human 25 % solution 12.5 g  12.5 g Intravenous Daily  Ottie Glazier, MD   Paused at 05/26/20 1003   Ampicillin-Sulbactam (UNASYN) 3 g in sodium chloride 0.9 % 100 mL IVPB  3 g Intravenous Q24H Ravishankar, Joellyn Quails, MD       Chlorhexidine Gluconate Cloth 2 % PADS 6 each  6 each Topical Daily Ottie Glazier, MD   6 each at 05/26/20 0034   feeding supplement (GLUCERNA SHAKE) (GLUCERNA SHAKE) liquid 237 mL  237 mL Oral TID BM Fritzi Mandes, MD   237 mL at 05/26/20 1440   hydrocortisone sodium succinate (SOLU-CORTEF) 100 MG injection 50 mg  50 mg Intravenous Daily Ottie Glazier, MD   50 mg at 05/26/20 0916   insulin aspart (novoLOG) injection 0-9 Units  0-9 Units Subcutaneous TID WC & HS Ottie Glazier, MD   3 Units at 05/26/20 1204   insulin glargine (LANTUS) injection 12 Units  12 Units Subcutaneous QHS Tu, Ching T, DO   12 Units at 05/25/20 2305   levothyroxine (SYNTHROID) tablet 25 mcg  25 mcg Oral q morning - 10a Tu, Ching T, DO   25 mcg at 05/26/20 0519   mometasone-formoterol (DULERA) 100-5 MCG/ACT inhaler 2 puff  2 puff Inhalation BID Tu, Ching T, DO   2 puff at 05/26/20 0916   morphine 2 MG/ML injection 1 mg  1 mg Intravenous Q2H PRN Tu, Ching T, DO   1 mg at 05/25/20  0041   ondansetron (ZOFRAN) injection 4 mg  4 mg Intravenous Q6H PRN Tu, Ching T, DO   4 mg at 05/23/20 1854   pantoprazole (PROTONIX) EC tablet 40 mg  40 mg Oral Daily Fritzi Mandes, MD   40 mg at 05/26/20 1547   potassium chloride SA (KLOR-CON) CR tablet 40 mEq  40 mEq Oral TID Dallie Piles, RPH   40 mEq at 05/26/20 1440   traMADol (ULTRAM) tablet 50 mg  50 mg Oral Q6H PRN Fritzi Mandes, MD         Vital Signs: Blood pressure (!) 102/58, pulse 67, temperature (!) 97.5 F (36.4 C), temperature source Oral, resp. rate 14, height 5\' 7"  (1.702 m), weight 63.9 kg, SpO2 95 %.   Intake/Output Summary (Last 24 hours) at 05/26/2020 1553 Last data filed at 05/26/2020 1552 Gross per 24 hour  Intake 346.99 ml  Output 1300 ml  Net -953.01 ml    Weight trends: Filed  Weights   05/18/20 1745 05/19/20 0206  Weight: 65.8 kg 63.9 kg    Physical Exam: General:  No acute distress  HEENT  scleral icterus noted, oral mucosa moist  Heart::  Regular, no rubs  Abdomen:  Soft, nondistended, bowel sounds present  Extremities:  1+ dependent edema  Neurologic:  Awake, alert, following commands  Skin:  Jaundice  Foley:  In place       Lab results: Basic Metabolic Panel: Recent Labs  Lab 05/23/20 0416 05/23/20 0416 05/24/20 0430 05/24/20 0430 05/25/20 0330 05/26/20 0535 05/26/20 1207  NA 137   < > 138   < > 138 132* 132*  K 4.2   < > 3.9   < > 3.9 3.3* 3.4*  CL 101   < > 98   < > 97* 93* 94*  CO2 25   < > 27   < > 25 27 26   GLUCOSE 121*   < > 81   < > 143* 164* 243*  BUN 79*   < > 84*   < > 90* 94* 92*  CREATININE 4.29*   < > 4.55*   < > 4.95* 4.86* 5.06*  CALCIUM 6.7*   < > 6.9*   < > 6.9* 6.9* 7.2*  MG 2.0   < > 2.1  --  2.2 2.0  --   PHOS 6.7*  --   --   --  8.4* UNABLE TO REPORT DUE TO ICTERUS  --    < > = values in this interval not displayed.    Liver Function Tests: Recent Labs  Lab 05/26/20 1207  AST 200*  ALT 219*  ALKPHOS 646*  BILITOT 14.0*  PROT 4.6*  ALBUMIN 2.2*   Recent Labs  Lab 05/22/20 1036  LIPASE 22   No results for input(s): AMMONIA in the last 168 hours.  CBC: Recent Labs  Lab 05/24/20 0430 05/26/20 1207  WBC 21.4* 14.8*  NEUTROABS 17.4* 10.3*  HGB 8.7* 7.6*  HCT 26.4* 23.7*  MCV 90.4 92.2  PLT 65* 63*    Cardiac Enzymes: No results for input(s): CKTOTAL, TROPONINI in the last 168 hours.  BNP: Invalid input(s): POCBNP  CBG: Recent Labs  Lab 05/25/20 0754 05/25/20 1644 05/25/20 2303 05/26/20 0739 05/26/20 1155  GLUCAP 140* 194* 201* 155* 238*    Microbiology: Recent Results (from the past 720 hour(s))  SARS Coronavirus 2 by RT PCR (hospital order, performed in [facility] hospital lab) Nasopharyngeal Urine, Clean Catch     Status:  None   Collection Time: 05/18/20 11:30 PM   Specimen:  Urine, Clean Catch; Nasopharyngeal  Result Value Ref Range Status   SARS Coronavirus 2 NEGATIVE NEGATIVE Final    Comment: (NOTE) SARS-CoV-2 target nucleic acids are NOT DETECTED.  The SARS-CoV-2 RNA is generally detectable in upper and lower respiratory specimens during the acute phase of infection. The lowest concentration of SARS-CoV-2 viral copies this assay can detect is 250 copies / mL. A negative result does not preclude SARS-CoV-2 infection and should not be used as the sole basis for treatment or other patient management decisions.  A negative result may occur with improper specimen collection / handling, submission of specimen other than nasopharyngeal swab, presence of viral mutation(s) within the areas targeted by this assay, and inadequate number of viral copies (<250 copies / mL). A negative result must be combined with clinical observations, patient history, and epidemiological information.  Fact Sheet for Patients:   StrictlyIdeas.no  Fact Sheet for Healthcare Providers: BankingDealers.co.za  This test is not yet approved or  cleared by the [place] FDA and has been authorized for detection and/or diagnosis of SARS-CoV-2 by FDA under an Emergency Use Authorization (EUA).  This EUA will remain in effect (meaning this test can be used) for the duration of the COVID-19 declaration under Section 564(b)(1) of the Act, 21 U.S.C. section 360bbb-3(b)(1), unless the authorization is terminated or revoked sooner.  Performed at [facility], [address] [place], [place] [address]   CULTURE, BLOOD (ROUTINE X 2) w Reflex to ID Panel     Status: Abnormal   Collection Time: 05/19/20  3:02 PM   Specimen: BLOOD  Result Value Ref Range Status   Specimen Description   Final    BLOOD RIGHT ANTECUBITAL Performed at [facility], [address] [place], [place] [address]    Special Requests   Final     BOTTLES DRAWN AEROBIC AND ANAEROBIC Blood Culture adequate volume Performed at [facility], [address], [place], [place] [address]    Culture  Setup Time   Final    GRAM NEGATIVE RODS IN BOTH AEROBIC AND ANAEROBIC BOTTLES CRITICAL RESULT CALLED TO, READ BACK BY AND VERIFIED WITH: Hart Robinsons [facility] [id number] 05/20/20 HNM Performed at [facility], [place] [address], [place], [place] [address]    Culture KLEBSIELLA PNEUMONIAE ESCHERICHIA COLI  (A)  Final   Report Status 05/23/2020 FINAL  Final   Organism ID, Bacteria KLEBSIELLA PNEUMONIAE  Final   Organism ID, Bacteria ESCHERICHIA COLI  Final      Susceptibility   Escherichia coli - MIC*    AMPICILLIN 8 SENSITIVE Sensitive     CEFAZOLIN <=4 SENSITIVE Sensitive     CEFEPIME <=0.12 SENSITIVE Sensitive     CEFTAZIDIME <=1 SENSITIVE Sensitive     CEFTRIAXONE <=0.25 SENSITIVE Sensitive     CIPROFLOXACIN <=0.25 SENSITIVE Sensitive     GENTAMICIN <=1 SENSITIVE Sensitive     IMIPENEM <=0.25 SENSITIVE Sensitive     TRIMETH/SULFA <=20 SENSITIVE Sensitive     AMPICILLIN/SULBACTAM <=2 SENSITIVE Sensitive     PIP/TAZO <=4 SENSITIVE Sensitive     * ESCHERICHIA COLI   Klebsiella pneumoniae - MIC*    AMPICILLIN RESISTANT Resistant     CEFAZOLIN <=4 SENSITIVE Sensitive     CEFEPIME <=0.12 SENSITIVE Sensitive     CEFTAZIDIME <=1 SENSITIVE Sensitive     CEFTRIAXONE <=0.25 SENSITIVE Sensitive     CIPROFLOXACIN <=0.25 SENSITIVE Sensitive     GENTAMICIN <=  1 SENSITIVE Sensitive     IMIPENEM <=0.25 SENSITIVE Sensitive     TRIMETH/SULFA <=20 SENSITIVE Sensitive     AMPICILLIN/SULBACTAM 4 SENSITIVE Sensitive     PIP/TAZO <=4 SENSITIVE Sensitive     * KLEBSIELLA PNEUMONIAE  Blood Culture ID Panel (Reflexed)     Status: Abnormal   Collection Time: 05/19/20  3:02 PM  Result Value Ref Range Status   Enterococcus species NOT DETECTED NOT DETECTED Final   Listeria monocytogenes NOT DETECTED NOT DETECTED Final   Staphylococcus species NOT  DETECTED NOT DETECTED Final   Staphylococcus aureus (BCID) NOT DETECTED NOT DETECTED Final   Streptococcus species NOT DETECTED NOT DETECTED Final   Streptococcus agalactiae NOT DETECTED NOT DETECTED Final   Streptococcus pneumoniae NOT DETECTED NOT DETECTED Final   Streptococcus pyogenes NOT DETECTED NOT DETECTED Final   Acinetobacter baumannii NOT DETECTED NOT DETECTED Final   Enterobacteriaceae species DETECTED (A) NOT DETECTED Final    Comment: CRITICAL RESULT CALLED TO, READ BACK BY AND VERIFIED WITH:  SCOTT HALL PHARMD 0335 05/20/20 HNM    Enterobacter cloacae complex NOT DETECTED NOT DETECTED Final   Escherichia coli DETECTED (A) NOT DETECTED Final    Comment: CRITICAL RESULT CALLED TO, READ BACK BY AND VERIFIED WITH: SCOTT HALL PHARMD 0335 05/20/20 HNM    Klebsiella oxytoca NOT DETECTED NOT DETECTED Final   Klebsiella pneumoniae DETECTED (A) NOT DETECTED Final    Comment: CRITICAL RESULT CALLED TO, READ BACK BY AND VERIFIED WITH: SCOTT HALL PHARMD 0335 05/20/20 HNM    Proteus species NOT DETECTED NOT DETECTED Final   Serratia marcescens NOT DETECTED NOT DETECTED Final   Carbapenem resistance NOT DETECTED NOT DETECTED Final   Haemophilus influenzae NOT DETECTED NOT DETECTED Final   Neisseria meningitidis NOT DETECTED NOT DETECTED Final   Pseudomonas aeruginosa NOT DETECTED NOT DETECTED Final   Candida albicans NOT DETECTED NOT DETECTED Final   Candida glabrata NOT DETECTED NOT DETECTED Final   Candida krusei NOT DETECTED NOT DETECTED Final   Candida parapsilosis NOT DETECTED NOT DETECTED Final   Candida tropicalis NOT DETECTED NOT DETECTED Final    Comment: Performed at [facility], [address] [place], [place] [address]  CULTURE, BLOOD (ROUTINE X 2) w Reflex to ID Panel     Status: Abnormal   Collection Time: 05/19/20  3:14 PM   Specimen: BLOOD  Result Value Ref Range Status   Specimen Description   Final    BLOOD BLOOD RIGHT HAND Performed at [facility], [address] [place], [place] [address]    Special Requests   Final    BOTTLES DRAWN AEROBIC AND ANAEROBIC Blood Culture adequate volume Performed at [facility], [address] [place], [place] [address]    Culture  Setup Time   Final    GRAM NEGATIVE RODS IN BOTH AEROBIC AND ANAEROBIC BOTTLES CRITICAL VALUE NOTED.  VALUE IS CONSISTENT WITH PREVIOUSLY REPORTED AND CALLED VALUE. Performed at [facility], [address] [place], [place] [address]    Culture KLEBSIELLA PNEUMONIAE (A)  Final   Report Status 05/22/2020 FINAL  Final   Organism ID, Bacteria KLEBSIELLA PNEUMONIAE  Final      Susceptibility   Klebsiella pneumoniae - MIC*    AMPICILLIN RESISTANT Resistant     CEFAZOLIN <=4 SENSITIVE Sensitive     CEFEPIME <=0.12 SENSITIVE Sensitive     CEFTAZIDIME <=1 SENSITIVE Sensitive     CEFTRIAXONE <=0.25 SENSITIVE Sensitive     CIPROFLOXACIN <=0.25 SENSITIVE  Sensitive     GENTAMICIN <=1 SENSITIVE Sensitive     IMIPENEM <=0.25 SENSITIVE Sensitive     TRIMETH/SULFA <=20 SENSITIVE Sensitive     AMPICILLIN/SULBACTAM 4 SENSITIVE Sensitive     PIP/TAZO <=4 SENSITIVE Sensitive     * KLEBSIELLA PNEUMONIAE  MRSA PCR Screening     Status: None   Collection Time: 05/21/20  5:24 PM   Specimen: Nasopharyngeal  Result Value Ref Range Status   MRSA by PCR NEGATIVE NEGATIVE Final    Comment:        The GeneXpert MRSA Assay (FDA approved for NASAL specimens only), is one component of a comprehensive MRSA colonization surveillance program. It is not intended to diagnose MRSA infection nor to guide or monitor treatment for MRSA infections. Performed at [facility], [address] [place], [place] [address]      Coagulation Studies: No results for input(s): LABPROT, INR in the last 72 hours.  Urinalysis: No results for input(s): COLORURINE, LABSPEC, PHURINE, GLUCOSEU, HGBUR, BILIRUBINUR, KETONESUR, PROTEINUR, UROBILINOGEN, NITRITE,  LEUKOCYTESUR in the last 72 hours.  Invalid input(s): APPERANCEUR      Imaging: No results found.   Assessment & Plan: Pt is a 74 y.o.  medical problems of Pancreatic neuroendocrine tumor, chronic pancreatitis, atrial flutter status post ablation, requiring anticoagulation with Eliquis, hypertension, coronary disease, COPD, insulin-dependent type 2 diabetes, CKD stage IIIb,   male with , was admitted on 05/18/2020 with Epigastric pain [R10.13] Abdominal pain [R10.9] Malignant neoplasm of pancreas, unspecified location of malignancy ([place]) [C25.9]  #.  Acute kidney injury on chronic kidney disease stage IV Baseline creatinine 2.6/GFR 27  Urinalysis (July 4) proteinuria 100 mg/dL, 0-5 RBCs, 0-5 WBC Imaging: July 5 CT abdomen: Pancreatitis, pancreatic pseudocysts, bile duct distention, left adrenal mass, mild perinephric stranding on the left.  No hydronephrosis  Acute kidney injury is likely secondary to concurrent illness, sepsis leading to ATN  Urine output was 1.3 L over the preceding 24 hours. Creatinine slightly high at 5.06. However no urgent need for dialysis at the moment. Patient would make poor dialysis candidate given multiple comorbidities. Continue to monitor renal parameters daily.  #Acute respiratory failure Patient doing well post extubation. Continue to monitor respiratory status.Marland Kitchen  #Sepsis Klebsiella bacteremia from July 5.   Patient transitioned to Unasyn at the moment.  #Pancreatic neuroendocrine tumor with pseudocyst, worsening biliary duct dilatation,  patient underwent ERCP on July 7 where occluded stent and purulent drainage in the common bile duct He underwent pus drainage from the biliary tree, stent placement in the common bile duct 7/7    LOS: 7 Malissie Musgrave 7/12/20213:53 PM    Note: This note was prepared with Dragon dictation. Any transcription errors are unintentional

## 2020-05-26 NOTE — Progress Notes (Signed)
ID Pt out of ICU No fever Eating  Patient Vitals for the past 24 hrs:  BP Temp Temp src Pulse Resp SpO2  05/26/20 0424 114/70 97.8 F (36.6 C) Oral 68 16 94 %  05/26/20 0000 116/67 98.1 F (36.7 C) Oral 67 18 99 %  05/25/20 2007 110/69 98 F (36.7 C) Oral 71 18 99 %  05/25/20 1614 108/62 98.3 F (36.8 C) -- 70 16 95 %  05/25/20 1600 -- -- -- -- 15 --  05/25/20 1500 -- -- -- 72 14 96 %  05/25/20 1400 106/60 -- -- -- 13 --  05/25/20 1300 -- -- -- -- 17 --  05/25/20 1200 119/68 -- -- -- 12 --  05/25/20 1100 -- -- -- -- 13 --      O/e Awake, alert Icteric Chest b/l air entry Hs regular now Abd soft  CBC Latest Ref Rng & Units 05/26/2020 05/24/2020 05/23/2020  WBC 4.0 - 10.5 K/uL 14.8(H) 21.4(H) 18.7(H)  Hemoglobin 13.0 - 17.0 g/dL 7.6(L) 8.7(L) 8.1(L)  Hematocrit 39 - 52 % 23.7(L) 26.4(L) 25.0(L)  Platelets 150 - 400 K/uL 63(L) 65(L) 68(L)   CMP Latest Ref Rng & Units 05/26/2020 05/26/2020 05/25/2020  Glucose 70 - 99 mg/dL 243(H) 164(H) 143(H)  BUN 8 - 23 mg/dL 92(H) 94(H) 90(H)  Creatinine 0.61 - 1.24 mg/dL 5.06(H) 4.86(H) 4.95(H)  Sodium 135 - 145 mmol/L 132(L) 132(L) 138  Potassium 3.5 - 5.1 mmol/L 3.4(L) 3.3(L) 3.9  Chloride 98 - 111 mmol/L 94(L) 93(L) 97(L)  CO2 22 - 32 mmol/L 26 27 25   Calcium 8.9 - 10.3 mg/dL 7.2(L) 6.9(L) 6.9(L)  Total Protein 6.5 - 8.1 g/dL 4.6(L) - -  Total Bilirubin 0.3 - 1.2 mg/dL 14.0(H) - -  Alkaline Phos 38 - 126 U/L 646(H) - -  AST 15 - 41 U/L 200(H) - -  ALT 0 - 44 U/L 219(H) - -          05/19/20 BC Klebsiella and e.coli  Impression/recommendation 74 yr male with neuroendocrine tumor involving pancreatic head, small bowel mesentery   Biliary obstruction with ascending cholangitisdue to pancreatic Neuroendocrine tumor S/p ERCP and stent exchange on 05/21/20- purulence noted in the bile duct Hypotension/resp distress /septic shock - was intubated on 7/7 and  on pressors Now extubated   E.coli and klebsiella bacteremia with  sepsis due to the above. Need to cover anerobes and enterococcus as well He has received 1 week of meropenem/zosyn- will switch  to unasyn for another  5-7 days   Also as platelets low will avoid further administration of zosyn   Leucocytosis improving Abnormal LFTS- combination of cholangitis/septic shock/CBD obstruction/tumor. Today 's labs improving  Pancreatitis and pseudocyst complication of the pancreative Neuroendocrine tumor with stent in pancreatic duct   CKD-worsening  Discussed the management with care team

## 2020-05-26 NOTE — Progress Notes (Addendum)
Hematology/Oncology Consult note [facility]  Telephone:([phone] [phone]  Patient Care Team: [name], MD as PCP - General (Cardiology) [name], MD as PCP - Cardiology (Cardiology) [name], MD as Consulting Physician (Cardiology) Clent Jacks, RN as Registered Nurse   Name of the patient: [name]  [id number]  June 17, 1946   Date of visit: 05/26/2020   Interval history-he is sitting up in his bed and feels fatigued but overall doing better over the last 2- 3 days.  Denies any abdominal pain presently was able to eat his limited lunch and drink his Ensure  ECOG PS- 3 Pain scale- 0   Review of systems- Review of Systems  Constitutional: Positive for malaise/fatigue. Negative for chills, fever and weight loss.  HENT: Negative for congestion, ear discharge and nosebleeds.   Eyes: Negative for blurred vision.  Respiratory: Negative for cough, hemoptysis, sputum production, shortness of breath and wheezing.   Cardiovascular: Negative for chest pain, palpitations, orthopnea and claudication.  Gastrointestinal: Negative for abdominal pain, blood in stool, constipation, diarrhea, heartburn, melena, nausea and vomiting.  Genitourinary: Negative for dysuria, flank pain, frequency, hematuria and urgency.  Musculoskeletal: Negative for back pain, joint pain and myalgias.  Skin: Negative for rash.  Neurological: Negative for dizziness, tingling, focal weakness, seizures, weakness and headaches.  Endo/Heme/Allergies: Does not bruise/bleed easily.  Psychiatric/Behavioral: Negative for depression and suicidal ideas. The patient does not have insomnia.      Allergies  Allergen Reactions  . Levaquin [Levofloxacin In D5w] Swelling and Rash     Past Medical History:  Diagnosis Date  . Aortic atherosclerosis ([place])    As seen on 04/2019 CT  . Atrial flutter ([place])    a. s/p ablation 2017/2018 and DCCV 2015/2016/2019  b. 2017 echo EF 60-65%, no RWMA, mild MR,  . CAD (coronary artery disease)    3v CAD 04/2019 CT  . Carcinoid tumor    metastatic  . CKD (chronic kidney disease), stage III   . COPD (chronic obstructive pulmonary disease) (H[facility])    PM oxygen  . Current occasional smoker    1-2 cigarettes / week  . Diabetes mellitus type 2, insulin dependent ([place])   . Heart failure with preserved ejection fraction ([place])    2017 echo EF 60-65%, no RWMA, mild MR,    . Hyperlipidemia   . Hypertension   . Pancreatitis   . Polycythemia      Past Surgical History:  Procedure Laterality Date  . A-FLUTTER ABLATION N/A 10/14/2017   Procedure: A-FLUTTER ABLATION;  Surgeon: Constance Haw, MD;  Location: [place] [facility];  Service: Cardiovascular;  Laterality: N/A;  . CARDIOVERSION N/A 09/08/2017   Procedure: CARDIOVERSION;  Surgeon: [name], MD;  Location: [facility];  Service: Cardiovascular;  Laterality: N/A;  . CARDIOVERSION N/A 03/22/2018   Procedure: CARDIOVERSION;  Surgeon: [name], MD;  Location: [facility];  Service: Cardiovascular;  Laterality: N/A;  . COLONOSCOPY    . COLONOSCOPY WITH PROPOFOL N/A 10/31/2019   Procedure: COLONOSCOPY WITH PROPOFOL;  Surgeon: Lin Landsman, MD;  Location: [facility];  Service: Gastroenterology;  Laterality: N/A;  . ELECTROPHYSIOLOGIC STUDY N/A 10/27/2016   Procedure: A-Flutter Ablation;  Surgeon: Deboraha Sprang, MD;  Location: [place] [facility];  Service: Cardiovascular;  Laterality: N/A;  . ENDOSCOPIC RETROGRADE CHOLANGIOPANCREATOGRAPHY (ERCP) WITH PROPOFOL N/A 05/21/2020   Procedure: ENDOSCOPIC RETROGRADE CHOLANGIOPANCREATOGRAPHY (ERCP) WITH PROPOFOL;  Surgeon: [name], MD;  Location: [facility] ENDOSCOPY;  Service: Endoscopy;  Laterality: N/A;  . EUS N/A 12/22/2017   Procedure: FULL UPPER ENDOSCOPIC ULTRASOUND (EUS) RADIAL;  Surgeon: Reita Cliche, MD;  Location: [facility] ENDOSCOPY;  Service: Gastroenterology;  Laterality: N/A;  . TEE WITH  CARDIOVERSION  12/16  . TEMPORARY DIALYSIS CATHETER N/A 02/08/2020   Procedure: TEMPORARY DIALYSIS CATHETER;  Surgeon: Algernon Huxley, MD;  Location: [place] [facility];  Service: Cardiovascular;  Laterality: N/A;    Social History   Socioeconomic History  . Marital status: Widowed    Spouse name: Not on file  . Number of children: Not on file  . Years of education: Not on file  . Highest education level: Not on file  Occupational History  . Not on file  Tobacco Use  . Smoking status: Light Tobacco Smoker    Years: 45.00    Types: Cigarettes    Last attempt to quit: 11/27/2012    Years since quitting: 7.4  . Smokeless tobacco: Never Used  Vaping Use  . Vaping Use: Never used  Substance and Sexual Activity  . Alcohol use: Yes    Alcohol/week: 2.0 standard drinks    Types: 2 Cans of beer per week    Comment: beer every once a while once a monthnone last 24hrs  . Drug use: No  . Sexual activity: Not on file  Other Topics Concern  . Not on file  Social History Narrative  . Not on file   Social Determinants of Health   Financial Resource Strain:   . Difficulty of Paying Living Expenses:   Food Insecurity:   . Worried About Charity fundraiser in the Last Year:   . Arboriculturist in the Last Year:   Transportation Needs:   . Film/video editor (Medical):   Marland Kitchen Lack of Transportation (Non-Medical):   Physical Activity:   . Days of Exercise per Week:   . Minutes of Exercise per Session:   Stress:   . Feeling of Stress :   Social Connections:   . Frequency of Communication with Friends and Family:   . Frequency of Social Gatherings with Friends and Family:   . Attends Religious Services:   . Active Member of Clubs or Organizations:   . Attends Archivist Meetings:   Marland Kitchen Marital Status:   Intimate Partner Violence:   . Fear of Current or Ex-Partner:   . Emotionally Abused:   Marland Kitchen Physically Abused:   . Sexually Abused:     Family History  Problem Relation  Age of Onset  . Hypertension Father      Current Facility-Administered Medications:  .  0.9 %  sodium chloride infusion, , Intravenous, PRN, Swayze, Ava, DO, Last Rate: 5 mL/hr at 05/26/20 1400, Rate Verify at 05/26/20 1400 .  0.9 %  sodium chloride infusion, 250 mL, Intravenous, Continuous, Aleskerov, Fuad, MD .  albumin human 25 % solution 12.5 g, 12.5 g, Intravenous, Daily, Ottie Glazier, MD, Paused at 05/26/20 1003 .  Ampicillin-Sulbactam (UNASYN) 3 g in sodium chloride 0.9 % 100 mL IVPB, 3 g, Intravenous, Q24H, Ravishankar, Jayashree, MD .  Chlorhexidine Gluconate Cloth 2 % PADS 6 each, 6 each, Topical, Daily, Ottie Glazier, MD, 6 each at 05/26/20 0034 .  feeding supplement (GLUCERNA SHAKE) (GLUCERNA SHAKE) liquid 237 mL, 237 mL, Oral, TID BM, Fritzi Mandes, MD, 237 mL at 05/26/20 1440 .  hydrocortisone sodium succinate (SOLU-CORTEF) 100 MG injection 50 mg, 50 mg, Intravenous, Daily, Lanney Gins, Fuad, MD, 50 mg at  05/26/20 0916 .  insulin aspart (novoLOG) injection 0-9 Units, 0-9 Units, Subcutaneous, TID WC & HS, Ottie Glazier, MD, 3 Units at 05/26/20 1204 .  insulin glargine (LANTUS) injection 12 Units, 12 Units, Subcutaneous, QHS, Tu, Ching T, DO, 12 Units at 05/25/20 2305 .  levothyroxine (SYNTHROID) tablet 25 mcg, 25 mcg, Oral, q morning - 10a, Tu, Ching T, DO, 25 mcg at 05/26/20 0519 .  mometasone-formoterol (DULERA) 100-5 MCG/ACT inhaler 2 puff, 2 puff, Inhalation, BID, Tu, Ching T, DO, 2 puff at 05/26/20 0916 .  morphine 2 MG/ML injection 1 mg, 1 mg, Intravenous, Q2H PRN, Tu, Ching T, DO, 1 mg at 05/25/20 0041 .  ondansetron (ZOFRAN) injection 4 mg, 4 mg, Intravenous, Q6H PRN, Tu, Ching T, DO, 4 mg at 05/23/20 1854 .  pantoprazole (PROTONIX) EC tablet 40 mg, 40 mg, Oral, Daily, Fritzi Mandes, MD, 40 mg at 05/26/20 1547 .  potassium chloride SA (KLOR-CON) CR tablet 40 mEq, 40 mEq, Oral, TID, Dallie Piles, RPH, 40 mEq at 05/26/20 1440 .  traMADol (ULTRAM) tablet 50 mg, 50 mg, Oral,  Q6H PRN, Fritzi Mandes, MD  Physical exam:  Vitals:   05/26/20 0000 05/26/20 0424 05/26/20 1049 05/26/20 1158  BP: 116/67 114/70 110/68 (!) 102/58  Pulse: 67 68 92 67  Resp: 18 16  14   Temp: 98.1 F (36.7 C) 97.8 F (36.6 C) 98.2 F (36.8 C) (!) 97.5 F (36.4 C)  TempSrc: Oral Oral  Oral  SpO2: 99% 94% 94% 95%  Weight:      Height:       Physical Exam Constitutional:      Comments: Sclerae deeply icteric  Cardiovascular:     Rate and Rhythm: Normal rate.     Heart sounds: Normal heart sounds.  Pulmonary:     Effort: Pulmonary effort is normal.     Breath sounds: Normal breath sounds.  Abdominal:     General: Bowel sounds are normal.     Palpations: Abdomen is soft.  Musculoskeletal:     Cervical back: Normal range of motion.  Skin:    General: Skin is warm and dry.  Neurological:     Mental Status: He is alert and oriented to person, place, and time.      CMP Latest Ref Rng & Units 05/26/2020  Glucose 70 - 99 mg/dL 243(H)  BUN 8 - 23 mg/dL 92(H)  Creatinine 0.61 - 1.24 mg/dL 5.06(H)  Sodium 135 - 145 mmol/L 132(L)  Potassium 3.5 - 5.1 mmol/L 3.4(L)  Chloride 98 - 111 mmol/L 94(L)  CO2 22 - 32 mmol/L 26  Calcium 8.9 - 10.3 mg/dL 7.2(L)  Total Protein 6.5 - 8.1 g/dL 4.6(L)  Total Bilirubin 0.3 - 1.2 mg/dL 14.0(H)  Alkaline Phos 38 - 126 U/L 646(H)  AST 15 - 41 U/L 200(H)  ALT 0 - 44 U/L 219(H)   CBC Latest Ref Rng & Units 05/26/2020  WBC 4.0 - 10.5 K/uL 14.8(H)  Hemoglobin 13.0 - 17.0 g/dL 7.6(L)  Hematocrit 39 - 52 % 23.7(L)  Platelets 150 - 400 K/uL 63(L)    @IMAGES @  CT ABDOMEN PELVIS WO CONTRAST  Result Date: 05/19/2020 CLINICAL DATA:  Abdominal pain with nausea in the setting of pancreatic neuroendocrine tumor EXAM: CT ABDOMEN AND PELVIS WITHOUT CONTRAST TECHNIQUE: Multidetector CT imaging of the abdomen and pelvis was performed following the standard protocol without IV contrast. COMPARISON:  February 08, 2020 FINDINGS: Lower chest: 4 mm LEFT lower lobe  pulmonary nodule along the pleural surface  not present on March of 2021. No consolidation. No pleural effusion. Hepatobiliary: Post placement of biliary stent. Despite presence of stent the common bile duct is more dilated than on the previous exam measuring up to 19 mm. Previously approximately 13 mm. Masslike areas in the pancreatic head are similar. There is been interval development of low attenuation in the pancreatic neck that was not present previously approximately 15 mm (image 28, series 2) Also further dilation of the distal pancreatic duct is suggested with increase in the soft tissue density in the mid body of the pancreas. This area measuring approximately 3.9 cm greatest thickness at the site of ductal transition where as on the prior study it measured approximately 3.5 cm. Extending from the upper margin of the dilated duct at the site of ductal transition is a low-attenuation ovoid area measuring water density at 5 by 3.1 cm, abutting the lesser curvature. (Image 19, series 2) previously measuring approximately 3.9 x 3.0 cm in this location peripancreatic stranding is mildly increased when compared to the prior study. Soft tissue lesion associated with LEFT adrenal contiguous with with increase in size (image 21, series 2) 2.6 x 2.7 cm, previously 2.5 x 2.4 cm. Pancreas: As above Spleen: Spleen grossly normal on noncontrast imaging. Adrenals/Urinary Tract: Adrenal gland on the RIGHT is normal. LEFT adrenal mass contiguous with the pancreas enlarging as described. Mild perinephric stranding and in general anterior pararenal stranding on the LEFT. No hydronephrosis. Urinary bladder is unremarkable. Stomach/Bowel: Stomach with mild to moderate distension. Mild dilation of small bowel loops becomes moderately dilated in the mid small bowel at the level of the jejunum. There is peristaltic activity versus is transition in the distal jejunum at 3.6 cm caliber proximal to the site of narrowing with  decompression of more distal small bowel loops. Colon is largely stool filled. Stool like material is present in the terminal ileum. Vascular/Lymphatic: Calcified atheromatous plaque of the abdominal aorta. No aneurysmal dilation. Adenopathy in the mesentery, largest measuring 11 mm is unchanged since the previous exam. Calcified mesenteric soft tissue (image 44, series 2) approximately 2.7 x 1.8 cm enlarged in short axis though difficult to measure given potential changes in mesenteric position over time. Reproductive: No adenopathy in the pelvis. Other: No free air.  No ascites. Musculoskeletal: No acute musculoskeletal process. Spinal degenerative changes. IMPRESSION: 1. 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 2. Worsening of biliary duct distension above the level of the stent since previous imaging from March of 2021 3. Increasing distension of small bowel in the proximal small bowel with site of transition in the distal jejunum. Is unclear whether this represents vigorous peristaltic activity following contrast administration or developing small bowel obstruction or ileus. Subsequent follow-up based on symptoms with radiographs may be helpful. 4. Increasing soft tissue and increasing ductal dilation with at least 2 sites of transition in this patient with known neuroendocrine tumor suspicious for worsening tumor with enlarging adrenal involvement on the LEFT as discussed, at the site of previous more benign appearing LEFT adrenal neoplasm now inseparable from stranding and soft tissue in the region of the pancreatic tail. 5. Given appearance it is possible that direct extension of stranding from the pancreas towards the LEFT adrenal may relate to inflammation. Attention on follow-up. 6. Extensive portosystemic collaterals in the upper abdomen compatible with splenic venous compromise, not well assessed but this  secondary finding is strongly suggestive of splenic venous compromise  due to worsening tumor in the region of the pancreas. Electronically Signed   By: Zetta Bills M.D.   On: 05/19/2020 12:00   DG Chest Port 1 View  Result Date: 05/21/2020 CLINICAL DATA:  Central line placement. EXAM: PORTABLE CHEST 1 VIEW COMPARISON:  Earlier this day. FINDINGS: Tip of the left internal jugular central venous catheter projects over the mid SVC. No pneumothorax. Endotracheal tube tip remains at the thoracic inlet. Normal heart size with unchanged mediastinal contours. Vascular congestion with chronic interstitial coarsening. Slight increase in left lung base atelectasis. No large pleural effusion. IMPRESSION: 1. Tip of the left internal jugular central venous catheter projects over the mid SVC. No pneumothorax. 2. Slight increase in left lung base atelectasis. Otherwise unchanged appearance of the chest. Electronically Signed   By: Keith Rake M.D.   On: 05/21/2020 20:53   DG Chest Port 1 View  Result Date: 05/21/2020 CLINICAL DATA:  Atrial fibrillation, pancreatitis, intubation EXAM: PORTABLE CHEST 1 VIEW COMPARISON:  Radiograph 02/08/2020 FINDINGS: Endotracheal tube 4.5 cm from the carina. Telemetry leads overlie the chest. Chronically coarsened interstitial opacities are present throughout the lungs, similar to the comparison. Hypoventilatory changes likely increased prominence of these findings. Mild central vascular congestion without convincing features of edema. No pneumothorax or effusion. The aorta is calcified. The remaining cardiomediastinal contours are unremarkable. No acute osseous or soft tissue abnormality. Degenerative changes are present in the imaged spine and shoulders. IMPRESSION: 1. Endotracheal tube 4.5 cm from the carina. 2. Chronically coarsened interstitial opacities, similar to the comparison. 3. Mild central vascular congestion without convincing features of edema. 4. Atelectasis.  Electronically Signed   By: Lovena Le M.D.   On: 05/21/2020 19:09   DG C-Arm 1-60 Min-No Report  Result Date: 05/21/2020 Fluoroscopy was utilized by the requesting physician.  No radiographic interpretation.   ECHOCARDIOGRAM COMPLETE  Result Date: 05/22/2020    ECHOCARDIOGRAM REPORT   Patient Name:   JEANETTE MOFFATT [facility] Date of Exam: 05/22/2020 Medical Rec #:  [id number]             Height:       67.0 in Accession #:    [id number]            Weight:       140.9 lb Date of Birth:  01/10/1946             BSA:          1.742 m Patient Age:    49 years              BP:           97/68 mmHg Patient Gender: M                     HR:           62 bpm. Exam Location:  [facility] Procedure: 2D Echo, Cardiac Doppler and Color Doppler Indications:     Shock  History:         Patient has prior history of Echocardiogram examinations, most                  recent 09/23/2016. COPD, Arrythmias:Atrial Flutter; Risk                  Factors:Hypertension, Diabetes and Dyslipidemia.  Sonographer:     Sherrie Sport RDCS (AE) Referring Phys:  [id number] Bradly Bienenstock Diagnosing Phys: Serafina Royals MD  Sonographer Comments: No parasternal window, no apical  window and echo performed with patient supine and on artificial respirator. The subcostal was the only view obtainable. and Image acquisition challenging due to COPD. IMPRESSIONS  1. Left ventricular ejection fraction, by estimation, is 35 to 40%. The left ventricle has moderately decreased function. The left ventricle demonstrates regional wall motion abnormalities (see scoring diagram/findings for description). Left ventricular  diastolic parameters were normal.  2. Right ventricular systolic function is moderately reduced. The right ventricular size is moderately enlarged. Mildly increased right ventricular wall thickness. There is severely elevated pulmonary artery systolic pressure.  3. Left atrial size was mild to moderately dilated.  4. Right atrial size was mild to moderately  dilated.  5. The mitral valve is normal in structure. Mild to moderate mitral valve regurgitation.  6. Tricuspid valve regurgitation is moderate.  7. The aortic valve is normal in structure. Aortic valve regurgitation is trivial. FINDINGS  Left Ventricle: Left ventricular ejection fraction, by estimation, is 35 to 40%. The left ventricle has moderately decreased function. The left ventricle demonstrates regional wall motion abnormalities. Moderate hypokinesis of the left ventricular, entire inferoseptal wall. The left ventricular internal cavity size was normal in size. There is no left ventricular hypertrophy. Left ventricular diastolic parameters were normal. Right Ventricle: The right ventricular size is moderately enlarged. Mildly increased right ventricular wall thickness. Right ventricular systolic function is moderately reduced. There is severely elevated pulmonary artery systolic pressure. The tricuspid  regurgitant velocity is 3.80 m/s, and with an assumed right atrial pressure of 10 mmHg, the estimated right ventricular systolic pressure is 25.6 mmHg. Left Atrium: Left atrial size was mild to moderately dilated. Right Atrium: Right atrial size was mild to moderately dilated. Pericardium: There is no evidence of pericardial effusion. Mitral Valve: The mitral valve is normal in structure. Mild to moderate mitral valve regurgitation. Tricuspid Valve: The tricuspid valve is normal in structure. Tricuspid valve regurgitation is moderate. Aortic Valve: The aortic valve is normal in structure. Aortic valve regurgitation is trivial. Pulmonic Valve: The pulmonic valve was normal in structure. Pulmonic valve regurgitation is mild. Aorta: The aortic root and ascending aorta are structurally normal, with no evidence of dilitation. IAS/Shunts: No atrial level shunt detected by color flow Doppler.  LEFT VENTRICLE PLAX 2D LVIDd:         4.23 cm LVIDs:         3.60 cm LV PW:         1.36 cm LV IVS:        0.91 cm  LEFT  ATRIUM         Index LA diam:    5.10 cm 2.93 cm/m  PULMONIC VALVE PV Vmax:        0.83 m/s PV Peak grad:   2.7 mmHg RVOT Peak grad: 2 mmHg  TRICUSPID VALVE TR Peak grad:   57.8 mmHg TR Vmax:        380.00 cm/s Serafina Royals MD Electronically signed by Serafina Royals MD Signature Date/Time: 05/22/2020/4:43:08 PM    Final      Assessment and plan- Patient is a 74 y.o. 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  Acute cholangitis: S/p ERCP with removal of metallic stent and placement of a new plastic stent.  Per procedure note there was brisk flow of bile after the procedure.  However patient's bilirubin remains significantly elevated post procedure.  Unclear if there is another level of obstruction or element of ischemic hepatitis and renal failure that is responsible for persistent elevation of his bilirubin. GI on board and they recommend repeat scans to see if there would be Role for PTCA if there are any other sites of obstruction   2. Pancreatic neuroendocrine tumor: Unfortunately these tumors do not respond well to octreotide.  He is already received 1 dose this hospitalization r and this is to be given monthly.  However with each hospitalization patient's blood work looks worse.  It is unclear if his liver functions will improve.  Also patient has baseline CKD with a creatinine that typically runs around 2.4.  During this episode of acute cholangitis he went into septic shock and ATN in his present creatinine is between 4-5.  He has not been deemed to be a surgical candidate per [facility]  I explained to the patient that it remains to be seen as to how much his liver and kidney functions would recover after this hospitalization.  Putting all his comorbidities together he would be a  potential candidate for hospice.  I will speak to his niece later today.  He is a DNR   Visit Diagnosis 1. Epigastric pain   2. Malignant neoplasm of pancreas, unspecified location of malignancy ([place])   3. Primary pancreatic neuroendocrine tumor   4. Abnormal CXR   5. Encounter for central line placement      Dr. Randa Evens, MD, MPH [facility] [phone] 05/26/2020 4:52 PM

## 2020-05-26 NOTE — Progress Notes (Addendum)
[name] , MD [address] [place], [place], [place], [address] [id number], [place], [place], [place], [address] Phone: [phone]  Fax: [phone]   Karry Causer is being followed for acute cholangitis S/p stent change    Subjective: He feels much better appetite is better not in any pain.   Objective: Vital signs in last 24 hours: Vitals:   05/26/20 0000 05/26/20 0424 05/26/20 1049 05/26/20 1158  BP: 116/67 114/70 110/68 (!) 102/58  Pulse: 67 68 92 67  Resp: 18 16  14   Temp: 98.1 F (36.7 C) 97.8 F (36.6 C) 98.2 F (36.8 C) (!) 97.5 F (36.4 C)  TempSrc: Oral Oral  Oral  SpO2: 99% 94% 94% 95%  Weight:      Height:       Weight change:   Intake/Output Summary (Last 24 hours) at 05/26/2020 1411 Last data filed at 05/26/2020 1100 Gross per 24 hour  Intake 338.1 ml  Output 850 ml  Net -511.9 ml     Exam:  Abdomen: Soft, nondistended, no guarding or rigidity bowel sounds are present.   Lab Results: @LABTEST2 @ Micro Results: Recent Results (from the past 240 hour(s))  SARS Coronavirus 2 by RT PCR (hospital order, performed in [facility] hospital lab) Nasopharyngeal Urine, Clean Catch     Status: None   Collection Time: 05/18/20 11:30 PM   Specimen: Urine, Clean Catch; Nasopharyngeal  Result Value Ref Range Status   SARS Coronavirus 2 NEGATIVE NEGATIVE Final    Comment: (NOTE) SARS-CoV-2 target nucleic acids are NOT DETECTED.  The SARS-CoV-2 RNA is generally detectable in upper and lower respiratory specimens during the acute phase of infection. The lowest concentration of SARS-CoV-2 viral copies this assay can detect is 250 copies / mL. A negative result does not preclude SARS-CoV-2 infection and should not be used as the sole basis for treatment or other patient management decisions.  A negative result may occur with improper specimen collection / handling, submission of specimen other than nasopharyngeal swab, presence of viral  mutation(s) within the areas targeted by this assay, and inadequate number of viral copies (<250 copies / mL). A negative result must be combined with clinical observations, patient history, and epidemiological information.  Fact Sheet for Patients:   StrictlyIdeas.no  Fact Sheet for Healthcare Providers: BankingDealers.co.za  This test is not yet approved or  cleared by the [place] FDA and has been authorized for detection and/or diagnosis of SARS-CoV-2 by FDA under an Emergency Use Authorization (EUA).  This EUA will remain in effect (meaning this test can be used) for the duration of the COVID-19 declaration under Section 564(b)(1) of the Act, 21 U.S.C. section 360bbb-3(b)(1), unless the authorization is terminated or revoked sooner.  Performed at [facility], [address] [place], [place] [address]   CULTURE, BLOOD (ROUTINE X 2) w Reflex to ID Panel     Status: Abnormal   Collection Time: 05/19/20  3:02 PM   Specimen: BLOOD  Result Value Ref Range Status   Specimen Description   Final    BLOOD RIGHT ANTECUBITAL Performed at [facility], [address], [place], [place] [address]    Special Requests   Final    BOTTLES DRAWN AEROBIC AND ANAEROBIC Blood Culture adequate volume Performed at [facility], [place], [place] [address]    Culture  Setup Time   Final    GRAM NEGATIVE RODS IN BOTH AEROBIC AND ANAEROBIC BOTTLES CRITICAL RESULT  CALLED TO, READ BACK BY AND VERIFIED WITH: [place] [id number] 05/20/20 HNM Performed at [facility] [place] [place] [address] [place], [place] [address]    Culture KLEBSIELLA PNEUMONIAE ESCHERICHIA COLI  (A)  Final   Report Status 05/23/2020 FINAL  Final   Organism ID, Bacteria KLEBSIELLA PNEUMONIAE  Final   Organism ID, Bacteria ESCHERICHIA COLI  Final      Susceptibility   Escherichia coli - MIC*    AMPICILLIN 8 SENSITIVE Sensitive       CEFAZOLIN <=4 SENSITIVE Sensitive     CEFEPIME <=0.12 SENSITIVE Sensitive     CEFTAZIDIME <=1 SENSITIVE Sensitive     CEFTRIAXONE <=0.25 SENSITIVE Sensitive     CIPROFLOXACIN <=0.25 SENSITIVE Sensitive     GENTAMICIN <=1 SENSITIVE Sensitive     IMIPENEM <=0.25 SENSITIVE Sensitive     TRIMETH/SULFA <=20 SENSITIVE Sensitive     AMPICILLIN/SULBACTAM <=2 SENSITIVE Sensitive     PIP/TAZO <=4 SENSITIVE Sensitive     * ESCHERICHIA COLI   Klebsiella pneumoniae - MIC*    AMPICILLIN RESISTANT Resistant     CEFAZOLIN <=4 SENSITIVE Sensitive     CEFEPIME <=0.12 SENSITIVE Sensitive     CEFTAZIDIME <=1 SENSITIVE Sensitive     CEFTRIAXONE <=0.25 SENSITIVE Sensitive     CIPROFLOXACIN <=0.25 SENSITIVE Sensitive     GENTAMICIN <=1 SENSITIVE Sensitive     IMIPENEM <=0.25 SENSITIVE Sensitive     TRIMETH/SULFA <=20 SENSITIVE Sensitive     AMPICILLIN/SULBACTAM 4 SENSITIVE Sensitive     PIP/TAZO <=4 SENSITIVE Sensitive     * KLEBSIELLA PNEUMONIAE  Blood Culture ID Panel (Reflexed)     Status: Abnormal   Collection Time: 05/19/20  3:02 PM  Result Value Ref Range Status   Enterococcus species NOT DETECTED NOT DETECTED Final   Listeria monocytogenes NOT DETECTED NOT DETECTED Final   Staphylococcus species NOT DETECTED NOT DETECTED Final   Staphylococcus aureus (BCID) NOT DETECTED NOT DETECTED Final   Streptococcus species NOT DETECTED NOT DETECTED Final   Streptococcus agalactiae NOT DETECTED NOT DETECTED Final   Streptococcus pneumoniae NOT DETECTED NOT DETECTED Final   Streptococcus pyogenes NOT DETECTED NOT DETECTED Final   Acinetobacter baumannii NOT DETECTED NOT DETECTED Final   Enterobacteriaceae species DETECTED (A) NOT DETECTED Final    Comment: CRITICAL RESULT CALLED TO, READ BACK BY AND VERIFIED WITH:  SCOTT HALL PHARMD [id number] 05/20/20 HNM    Enterobacter cloacae complex NOT DETECTED NOT DETECTED Final   Escherichia coli DETECTED (A) NOT DETECTED Final    Comment: CRITICAL RESULT CALLED TO,  READ BACK BY AND VERIFIED WITH: SCOTT HALL PHARMD [id number] 05/20/20 HNM    Klebsiella oxytoca NOT DETECTED NOT DETECTED Final   Klebsiella pneumoniae DETECTED (A) NOT DETECTED Final    Comment: CRITICAL RESULT CALLED TO, READ BACK BY AND VERIFIED WITH: SCOTT HALL PHARMD [id number] 05/20/20 HNM    Proteus species NOT DETECTED NOT DETECTED Final   Serratia marcescens NOT DETECTED NOT DETECTED Final   Carbapenem resistance NOT DETECTED NOT DETECTED Final   Haemophilus influenzae NOT DETECTED NOT DETECTED Final   Neisseria meningitidis NOT DETECTED NOT DETECTED Final   Pseudomonas aeruginosa NOT DETECTED NOT DETECTED Final   Candida albicans NOT DETECTED NOT DETECTED Final   Candida glabrata NOT DETECTED NOT DETECTED Final   Candida krusei NOT DETECTED NOT DETECTED Final   Candida parapsilosis NOT DETECTED NOT DETECTED Final   Candida tropicalis NOT DETECTED NOT DETECTED Final    Comment: Performed at [facility], [address] [place], [place] [address]  CULTURE, BLOOD (ROUTINE X 2) w Reflex to ID Panel     Status: Abnormal   Collection Time: 05/19/20  3:14 PM   Specimen: BLOOD  Result Value Ref Range Status   Specimen Description   Final    BLOOD BLOOD RIGHT HAND Performed at [facility], [address], [place], [place] [address]    Special Requests   Final    BOTTLES DRAWN AEROBIC AND ANAEROBIC Blood Culture adequate volume Performed at [facility], [address], [place], [place] [address]    Culture  Setup Time   Final    GRAM NEGATIVE RODS IN BOTH AEROBIC AND ANAEROBIC BOTTLES CRITICAL VALUE NOTED.  VALUE IS CONSISTENT WITH PREVIOUSLY REPORTED AND CALLED VALUE. Performed at [facility], [address] [place], [place] [address]    Culture KLEBSIELLA PNEUMONIAE (A)  Final   Report Status 05/22/2020 FINAL  Final   Organism ID, Bacteria KLEBSIELLA PNEUMONIAE  Final      Susceptibility   Klebsiella pneumoniae - MIC*    AMPICILLIN RESISTANT  Resistant     CEFAZOLIN <=4 SENSITIVE Sensitive     CEFEPIME <=0.12 SENSITIVE Sensitive     CEFTAZIDIME <=1 SENSITIVE Sensitive     CEFTRIAXONE <=0.25 SENSITIVE Sensitive     CIPROFLOXACIN <=0.25 SENSITIVE Sensitive     GENTAMICIN <=1 SENSITIVE Sensitive     IMIPENEM <=0.25 SENSITIVE Sensitive     TRIMETH/SULFA <=20 SENSITIVE Sensitive     AMPICILLIN/SULBACTAM 4 SENSITIVE Sensitive     PIP/TAZO <=4 SENSITIVE Sensitive     * KLEBSIELLA PNEUMONIAE  MRSA PCR Screening     Status: None   Collection Time: 05/21/20  5:24 PM   Specimen: Nasopharyngeal  Result Value Ref Range Status   MRSA by PCR NEGATIVE NEGATIVE Final    Comment:        The GeneXpert MRSA Assay (FDA approved for NASAL specimens only), is one component of a comprehensive MRSA colonization surveillance program. It is not intended to diagnose MRSA infection nor to guide or monitor treatment for MRSA infections. Performed at [facility], [address], [place], [place] [address]    Studies/Results: No results found. Medications: I have reviewed the patient's current medications. Scheduled Meds: . Chlorhexidine Gluconate Cloth  6 each Topical Daily  . feeding supplement (GLUCERNA SHAKE)  237 mL Oral TID BM  . hydrocortisone sod succinate (SOLU-CORTEF) inj  50 mg Intravenous Daily  . insulin aspart  0-9 Units Subcutaneous TID WC & HS  . insulin glargine  12 Units Subcutaneous QHS  . levothyroxine  25 mcg Oral q morning - 10a  . mometasone-formoterol  2 puff Inhalation BID  . pantoprazole (PROTONIX) IV  40 mg Intravenous Q24H  . potassium chloride  40 mEq Oral TID   Continuous Infusions: . sodium chloride 250 mL (05/26/20 1159)  . sodium chloride    . albumin human Stopped (05/26/20 1003)  . ampicillin-sulbactam (UNASYN) IV     PRN Meds:.sodium chloride, morphine injection, ondansetron (ZOFRAN) IV   Assessment: Principal Problem:   Acute cholangitis Active Problems:   Typical atrial flutter  (HCC)   Essential hypertension   Chronic obstructive pulmonary disease (HCC)   Diabetes type 2, uncontrolled (HCC)   CAD (coronary artery disease)   Abdominal pain   Acute kidney injury superimposed on chronic kidney disease (HCC)   Transaminitis   Neoplasm related pain   Primary pancreatic neuroendocrine tumor   Protein-calorie malnutrition, severe  Sepsis ([place])   Bacteremia   Malignant neoplasm of pancreas ([place])   Chai Routh 74 y.o. male S/p ERCP and stent change for acute cholangitis, pancreatitis. . Has elevated T bilirubin and transaminases . 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 I believe discussion in progress regarding comfort care measures. IF plan is for full medical care then would suggest to repeat abdominal imaging tomorrow   to determine status of pancreatitis, cysts and splenic venous vasculature to determine if there is any reason for persistence of elevated bilirubin.  Clinically he appears much better. .  It is possible that the bilirubin is lagging behind with the renal failure and decrease in excretion of the bilirubin.  I will also check a fractionated bilirubin.  His transaminases are decreasing.    LOS: 7 days   Carlos Bellows, MD 05/26/2020, 2:11 PM

## 2020-05-26 NOTE — Progress Notes (Signed)
Dr Posey Pronto notified of run of a-fib and elevated heart rate this am. No new orders

## 2020-05-26 NOTE — Progress Notes (Signed)
IJ central line removed per Dr Serita Grit request.

## 2020-05-26 NOTE — Progress Notes (Signed)
Nurse and CNA attempted to get the patient up. He was assist x 2 to get to the side of the bed. He was unable to bear weight to stand.

## 2020-05-26 NOTE — Progress Notes (Addendum)
[name] at [employer] NAME: [name]    MR#:  [id number]  DATE OF BIRTH:  04-Nov-1946  SUBJECTIVE:  sitting up in the bed watching TV. Keeps repeating himself when asked question. Ate some breakfast. No new issues per RN. No family in the room.  REVIEW OF SYSTEMS:   Review of Systems  Constitutional: Negative for chills, fever and weight loss.  HENT: Negative for ear discharge, ear pain and nosebleeds.   Eyes: Negative for blurred vision, pain and discharge.  Respiratory: Negative for sputum production, shortness of breath, wheezing and stridor.   Cardiovascular: Negative for chest pain, palpitations, orthopnea and PND.  Gastrointestinal: Negative for abdominal pain, diarrhea, nausea and vomiting.  Genitourinary: Negative for frequency and urgency.  Musculoskeletal: Negative for back pain and joint pain.  Neurological: Negative for sensory change, speech change, focal weakness and weakness.  Psychiatric/Behavioral: Negative for depression and hallucinations. The patient is not nervous/anxious.    Tolerating Diet:yes Tolerating PT: pending  DRUG ALLERGIES:   Allergies  Allergen Reactions  . Levaquin [Levofloxacin In D5w] Swelling and Rash    VITALS:  Blood pressure (!) 102/58, pulse 67, temperature (!) 97.5 F (36.4 C), temperature source Oral, resp. rate 14, height 5\' 7"  (1.702 m), weight 63.9 kg, SpO2 95 %.  PHYSICAL EXAMINATION:   Physical Exam  GENERAL:  74 y.o.-year-old patient lying in the bed with no acute distress.  EYES: Pupils equal, round, reactive to light and accommodation. No scleral icterus.   HEENT: Head atraumatic, normocephalic. Oropharynx and nasopharynx clear.  NECK:  Supple, no jugular venous distention. No thyroid enlargement, no tenderness.  CL + LUNGS: Normal breath sounds bilaterally, no wheezing, rales, rhonchi. No use of accessory muscles of respiration.  CARDIOVASCULAR: S1, S2 normal. No murmurs,  rubs, or gallops.  ABDOMEN: Soft, nontender, nondistended. Bowel sounds present. No organomegaly or mass. Foley+ EXTREMITIES: No cyanosis, clubbing or edema b/l.    NEUROLOGIC: Cranial nerves II through XII are intact. No focal Motor or sensory deficits b/l.   PSYCHIATRIC:  patient is alert and oriented x 3.  SKIN: No obvious rash, lesion, or ulcer.   LABORATORY PANEL:  CBC Recent Labs  Lab 05/24/20 0430  WBC 21.4*  HGB 8.7*  HCT 26.4*  PLT 65*    Chemistries  Recent Labs  Lab 05/26/20 0535 05/26/20 0535 05/26/20 1207  NA 132*   < > 132*  K 3.3*   < > 3.4*  CL 93*   < > 94*  CO2 27   < > 26  GLUCOSE 164*   < > 243*  BUN 94*   < > 92*  CREATININE 4.86*   < > 5.06*  CALCIUM 6.9*   < > 7.2*  MG 2.0  --   --   AST  --   --  200*  ALT  --   --  219*  ALKPHOS  --   --  646*  BILITOT  --   --  14.0*   < > = values in this interval not displayed.   Cardiac Enzymes No results for input(s): TROPONINI in the last 168 hours. RADIOLOGY:  No results found. ASSESSMENT AND PLAN:   [name] is a 74 year old male with a past medical history significant for atrial flutter s/p DCCV x 3 and ablation in 2017/2018, anticoagulated with Eliquis, coronary artery disease detected by chest CT in 2020, HFpEF, oxygen dependent COPD, hypothyroidism, type 2 diabetes, hypertension,  and recently diagnosed pancreatic cancer who presented to the ED on 05/18/20 for epigastric pain with associated nausea and vomiting.Abdominal CT revealed pancreatitis with a presumed pseudocyst with worsening of the biliary ductal dilatation. Blood cultures were positive for polymicrobial bacteremia. Blood cultures having both Klebsiella and E. Coli  E. coli/Klebsiella sepsis secondary to cholangitis with obstructive jaundice in the setting of pancreatic neuroendocrine tumor -patient was transferred out of ICU. Came in with septic shock-- now resolved. -Will discontinue IV Solucortef tomorrow -IV unasyn per Dr.  Delaine Galvan -no fever -white count stable  Acute on chronic renal failure stage IIIa -patient had require dialysis and previous admission -making good urine -remove Foley -appreciate [name] input -avoid nephrotoxins  Severe protein calorie malnutrition -follow dietitian recommendation  Biliary obstruction with jaundice secondary to pancreatic neuroendocrine tumor -patient is not a candidate for any surgery -underwent ERCP with biliary stent placement-- bilirubin continues to be elevated. -- Will need to discuss with family whether they want to pursue anything further or continue current management -follows by oncology [name]-- Octreotide did not seem to help -patient overall has a poor prognosis -per ICU attending note-- discussion was made with patient's nieces [name] and [name] and they chose comfort care and no aggressive measures  -patient is DNR  DVT prophylaxis SCD  Hypothyroidism continue Synthroid  Type II diabetes with renal disease continue sliding scale and Lantus  Remove Foley catheter today. Place external catheter. Plan to remove the central line once determination for changing to oral antibiotics is made.  Procedures:ERCP Family communication : Consults : ID, oncology, G.I. CODE STATUS: DNR DVT Prophylaxis : SCD  Status is: Inpatient  Remains inpatient appropriate because:Unsafe d/c plan and IV treatments appropriate due to intensity of illness or inability to take PO   Dispo: The patient is from: Home              Anticipated d/c is to: TBD              Anticipated d/c date is: 2 days              Patient currently is not medically stable to d/c. patient just got out of ICU. Need to discuss with family discharge plan. PT to see patient.      TOTAL TIME TAKING CARE OF THIS PATIENT: 25 minutes.  >50% time spent on counselling and coordination of care  Note: This dictation was prepared with Dragon dictation along with smaller phrase  technology. Any transcriptional errors that result from this process are unintentional.  [name] M.D    [facility]   CC: Primary care physician; [name], MDPatient ID: [name], male   DOB: [birth year], 74 y.o.   MRN: [id number]

## 2020-05-26 NOTE — Care Management Important Message (Signed)
Important Message  Patient Details  Name: [name] MRN: [id number] Date of Birth: 20-Nov-1945   Medicare Important Message Given:  Yes     Dannette Barbara 05/26/2020, 12:00 PM

## 2020-05-26 NOTE — Progress Notes (Signed)
Nutrition Follow Up Note   DOCUMENTATION CODES:   Severe malnutrition in context of chronic illness  INTERVENTION:   Glucerna Shake po TID, each supplement provides 220 kcal and 10 grams of protein.   NUTRITION DIAGNOSIS:   Severe Malnutrition related to chronic illness (cancer, COPD) as evidenced by 17 percent weight loss in 7 months, moderate fat depletion, severe muscle depletion.  GOAL:   Patient will meet greater than or equal to 90% of their needs -not met   MONITOR:   PO intake, Supplement acceptance, Labs, Weight trends, Skin, I & O's  ASSESSMENT:   74 y.o. male with medical history significant for pancreatic neuroendocrine tumor, chronic pancreatitis, atrial flutter s/p ablation on Eliquis, hypertension, CAD, COPD, insulin-dependent type 2 diabetes, CKD stage IIIb who presents with worsening abdominal pain.   Pt continues to have poor appetite and oral intake in hospital; pt eating <50% of meals. RD will add supplements today to help pt meet his estimated needs. Palliative care consult pending.   Medications reviewed and include: insulin, synthroid, protonix, KCl, albumin, unasyn   Labs reviewed: Na 132(L), K 3.4(L), BUN 92(H), creat 5.05(H), alk phos 646(H), AST 200(H), ALT 219(H), Tbili 14.0(H) Wbc- 21.4(H), Hgb 8.7(L), Hct 26.4(L) cbgs- 155, 238 x 24 hrs  Diet Order:   Diet Order            DIET SOFT Room service appropriate? Yes; Fluid consistency: Thin  Diet effective now               EDUCATION NEEDS:   Education needs have been addressed  Skin:  Skin Assessment: Reviewed RN Assessment (ecchymosis)  Last BM:  7/11  Height:   Ht Readings from Last 1 Encounters:  05/19/20 _0  (1.702 m)    Weight:   Wt Readings from Last 1 Encounters:  05/19/20 63.9 kg    Ideal Body Weight:  67 kg  BMI:  Body mass index is 22.06 kg/m.  Estimated Nutritional Needs:   Kcal:  1700-2000kcal/day  Protein:  90-100g/day  Fluid:  >1.7L/day  Carlos Distance MS, RD, LDN Please refer to [facility] for RD and/or RD on-call/weekend/after hours pager

## 2020-05-26 NOTE — Consult Note (Signed)
PHARMACY CONSULT NOTE  Pharmacy Consult for Electrolyte Monitoring and Replacement   Recent Labs: Potassium (mmol/L)  Date Value  05/26/2020 3.3 (L)  11/01/2014 3.8   Magnesium (mg/dL)  Date Value  05/26/2020 2.0  10/30/2014 1.8   Calcium (mg/dL)  Date Value  05/26/2020 6.9 (L)   Calcium, Total (mg/dL)  Date Value  11/01/2014 9.4   Albumin (g/dL)  Date Value  05/26/2020 2.0 (L)  10/29/2014 3.0 (L)   Phosphorus (mg/dL)  Date Value  05/26/2020 UNABLE TO REPORT DUE TO ICTERUS   Sodium (mmol/L)  Date Value  05/26/2020 132 (L)  10/11/2017 140  11/01/2014 138   Corrected Ca: 8.5 mg/dL  Assessment: 74 y.o. male with a history of CKD, COPD, atrial flutter s/p DCCV x 3 and ablation in 2017/2018, anticoagulated with Eliquis, CAD, diabetes, pancreatic cancer, recently diagnosed who presents with epigastric abdominal pain now with E coli and Klebsiella bacteremia.   Goal of Therapy:  Potassium 4.0 - 5.1 mmol/L Magnesium 2.0 - 2.4 mg/dL All Other Electrolytes WNL  Plan:   40 mEq oral KCl x 2  F/U electrolytes in am 7/13  Dallie Piles ,PharmD Clinical Pharmacist 05/26/2020 12:10 PM

## 2020-05-27 ENCOUNTER — Ambulatory Visit: Payer: Medicare Other | Admitting: Internal Medicine

## 2020-05-27 DIAGNOSIS — Z515 Encounter for palliative care: Secondary | ICD-10-CM

## 2020-05-27 DIAGNOSIS — Z7189 Other specified counseling: Secondary | ICD-10-CM

## 2020-05-27 LAB — GLUCOSE, CAPILLARY
Glucose-Capillary: 100 mg/dL — ABNORMAL HIGH (ref 70–99)
Glucose-Capillary: 117 mg/dL — ABNORMAL HIGH (ref 70–99)
Glucose-Capillary: 223 mg/dL — ABNORMAL HIGH (ref 70–99)
Glucose-Capillary: 187 mg/dL — ABNORMAL HIGH (ref 70–99)

## 2020-05-27 LAB — RENAL FUNCTION PANEL
Albumin: 2.1 g/dL — ABNORMAL LOW (ref 3.5–5.0)
Anion gap: 12 (ref 5–15)
BUN: 93 mg/dL — ABNORMAL HIGH (ref 8–23)
CO2: 27 mmol/L (ref 22–32)
Calcium: 7.5 mg/dL — ABNORMAL LOW (ref 8.9–10.3)
Chloride: 97 mmol/L — ABNORMAL LOW (ref 98–111)
Creatinine, Ser: 4.92 mg/dL — ABNORMAL HIGH (ref 0.61–1.24)
GFR calc Af Amer: 12 mL/min — ABNORMAL LOW (ref >60)
GFR calc non Af Amer: 11 mL/min — ABNORMAL LOW (ref >60)
Glucose, Bld: 125 mg/dL — ABNORMAL HIGH (ref 70–99)
Phosphorus: 7.1 mg/dL — ABNORMAL HIGH (ref 2.5–4.6)
Potassium: 3.7 mmol/L (ref 3.5–5.1)
Sodium: 136 mmol/L (ref 135–145)

## 2020-05-27 LAB — BILIRUBIN, DIRECT: Bilirubin, Direct: 6.7 mg/dL — ABNORMAL HIGH (ref 0.0–0.2)

## 2020-05-27 LAB — MAGNESIUM: Magnesium: 2 mg/dL (ref 1.7–2.4)

## 2020-05-27 NOTE — Progress Notes (Signed)
[address] [place], [place] [address] Phone [phone]. Fax [phone]  Date: 05/27/2020                  Patient Name:  [name]  MRN: [id number]  DOB: 1946-05-03  Age / Sex: 74 y.o., male         PCP: [name], MD                 Service Requesting Consult: IM/ Fritzi Mandes, MD                 Reason for Consult: ARF            History of Present Illness: Patient is a 74 y.o. male with medical problems of Pancreatic neuroendocrine tumor, chronic pancreatitis, atrial flutter status post ablation, requiring anticoagulation with Eliquis, hypertension, coronary disease, COPD, insulin-dependent type 2 diabetes, CKD stage IIIb, who was admitted to [facility] on 05/18/2020 for evaluation of Epigastric pain [R10.13] Abdominal pain [R10.9] Malignant neoplasm of pancreas, unspecified location of malignancy ([place]) [C25.9]  Patient has baseline chronic diffuse abdominal pain from his pancreatic cancer but came in for acute worsening with nausea and vomiting. Nephrology consult has been requested for evaluation of acute renal failure Patient is currently intubated and on multiple pressors.  He is not able to provide any meaningful information.  All information is obtained from the chart and primary team.  05/27/2020 update: Urine output 1.3 L over the preceding 24 hours. Creatinine still high at 4.92.  07/12 0701 - 07/13 0700 In: 239.5 [I.V.:50; IV Piggyback:189.5] Out: 1620 [Urine:1320]   Current medications: Current Facility-Administered Medications  Medication Dose Route Frequency Provider Last Rate Last Admin  . 0.9 %  sodium chloride infusion   Intravenous PRN Swayze, Ava, DO   Stopped at 05/27/20 0137  . 0.9 %  sodium chloride infusion  250 mL Intravenous Continuous Aleskerov, Fuad, MD      . albumin human 25 % solution 12.5 g  12.5 g Intravenous Daily Ottie Glazier, MD   Paused at 05/26/20 1003  . Ampicillin-Sulbactam (UNASYN) 3 g in sodium chloride  0.9 % 100 mL IVPB  3 g Intravenous Q24H Tsosie Billing, MD   Stopped at 05/26/20 2241  . feeding supplement (GLUCERNA SHAKE) (GLUCERNA SHAKE) liquid 237 mL  237 mL Oral TID BM Fritzi Mandes, MD   237 mL at 05/26/20 2158  . hydrocortisone sodium succinate (SOLU-CORTEF) 100 MG injection 50 mg  50 mg Intravenous Daily Ottie Glazier, MD   50 mg at 05/26/20 0916  . insulin aspart (novoLOG) injection 0-9 Units  0-9 Units Subcutaneous TID WC & HS Ottie Glazier, MD   3 Units at 05/26/20 2159  . insulin glargine (LANTUS) injection 12 Units  12 Units Subcutaneous QHS Tu, Ching T, DO   12 Units at 05/26/20 2159  . levothyroxine (SYNTHROID) tablet 25 mcg  25 mcg Oral q morning - 10a Tu, Ching T, DO   25 mcg at 05/27/20 0515  . mometasone-formoterol (DULERA) 100-5 MCG/ACT inhaler 2 puff  2 puff Inhalation BID Tu, Ching T, DO   2 puff at 05/26/20 2159  . morphine 2 MG/ML injection 1 mg  1 mg Intravenous Q2H PRN Tu, Ching T, DO   1 mg at 05/25/20 0041  . ondansetron (ZOFRAN) injection 4 mg  4 mg Intravenous Q6H PRN Tu, Ching T, DO   4 mg at 05/23/20 1854  . pantoprazole (PROTONIX) EC tablet 40 mg  40 mg Oral Daily  Fritzi Mandes, MD   40 mg at 05/26/20 1547  . traMADol (ULTRAM) tablet 50 mg  50 mg Oral Q6H PRN Fritzi Mandes, MD         Vital Signs: Blood pressure 117/65, pulse 65, temperature 97.8 F (36.6 C), temperature source Oral, resp. rate 16, height 5\' 7"  (1.702 m), weight 63.9 kg, SpO2 95 %.   Intake/Output Summary (Last 24 hours) at 05/27/2020 1113 Last data filed at 05/27/2020 0918 Gross per 24 hour  Intake 501.41 ml  Output 1920 ml  Net -1418.59 ml    Weight trends: [employer]   05/18/20 1745 05/19/20 0206  Weight: 65.8 kg 63.9 kg    Physical Exam: General:  No acute distress  HEENT  scleral icterus noted, oral mucosa moist  Heart::  Regular, no rubs  Abdomen:  Soft, nondistended, bowel sounds present  Extremities:  1+ dependent edema  Neurologic:  Awake, alert, following  commands  Skin:  Jaundice           Lab results: Basic Metabolic Panel: Recent Labs  Lab 05/25/20 0330 05/25/20 0330 05/26/20 0535 05/26/20 1207 05/27/20 0505  NA 138   < > 132* 132* 136  K 3.9   < > 3.3* 3.4* 3.7  CL 97*   < > 93* 94* 97*  CO2 25   < > 27 26 27   GLUCOSE 143*   < > 164* 243* 125*  BUN 90*   < > 94* 92* 93*  CREATININE 4.95*   < > 4.86* 5.06* 4.92*  CALCIUM 6.9*   < > 6.9* 7.2* 7.5*  MG 2.2  --  2.0  --  2.0  PHOS 8.4*  --  UNABLE TO REPORT DUE TO ICTERUS  --  7.1*   < > = values in this interval not displayed.    Liver Function Tests: Recent Labs  Lab 05/26/20 1207 05/26/20 1207 05/27/20 0505  AST 200*  --   --   ALT 219*  --   --   ALKPHOS 646*  --   --   BILITOT 14.0*  --   --   PROT 4.6*  --   --   ALBUMIN 2.2*   < > 2.1*   < > = values in this interval not displayed.   Recent Labs  Lab 05/22/20 1036  LIPASE 22   No results for input(s): AMMONIA in the last 168 hours.  CBC: Recent Labs  Lab 05/24/20 0430 05/26/20 1207  WBC 21.4* 14.8*  NEUTROABS 17.4* 10.3*  HGB 8.7* 7.6*  HCT 26.4* 23.7*  MCV 90.4 92.2  PLT 65* 63*    Cardiac Enzymes: No results for input(s): CKTOTAL, TROPONINI in the last 168 hours.  BNP: Invalid input(s): POCBNP  CBG: Recent Labs  Lab 05/26/20 0739 05/26/20 1155 05/26/20 1623 05/26/20 2131 05/27/20 0751  GLUCAP 155* 238* 282* 220* 100*    Microbiology: Recent Results (from the past 720 hour(s))  SARS Coronavirus 2 by RT PCR (hospital order, performed in [facility] hospital lab) Nasopharyngeal Urine, Clean Catch     Status: None   Collection Time: 05/18/20 11:30 PM   Specimen: Urine, Clean Catch; Nasopharyngeal  Result Value Ref Range Status   SARS Coronavirus 2 NEGATIVE NEGATIVE Final    Comment: (NOTE) SARS-CoV-2 target nucleic acids are NOT DETECTED.  The SARS-CoV-2 RNA is generally detectable in upper and lower respiratory specimens during the acute phase of infection. The  lowest concentration of SARS-CoV-2 viral copies this assay can detect  is 250 copies / mL. A negative result does not preclude SARS-CoV-2 infection and should not be used as the sole basis for treatment or other patient management decisions.  A negative result may occur with improper specimen collection / handling, submission of specimen other than nasopharyngeal swab, presence of viral mutation(s) within the areas targeted by this assay, and inadequate number of viral copies (<250 copies / mL). A negative result must be combined with clinical observations, patient history, and epidemiological information.  Fact Sheet for Patients:   StrictlyIdeas.no  Fact Sheet for Healthcare Providers: BankingDealers.co.za  This test is not yet approved or  cleared by the [place] FDA and has been authorized for detection and/or diagnosis of SARS-CoV-2 by FDA under an Emergency Use Authorization (EUA).  This EUA will remain in effect (meaning this test can be used) for the duration of the COVID-19 declaration under Section 564(b)(1) of the Act, 21 U.S.C. section 360bbb-3(b)(1), unless the authorization is terminated or revoked sooner.  Performed at [facility], [address] [place], [place] [address]   CULTURE, BLOOD (ROUTINE X 2) w Reflex to ID Panel     Status: Abnormal   Collection Time: 05/19/20  3:02 PM   Specimen: BLOOD  Result Value Ref Range Status   Specimen Description   Final    BLOOD RIGHT ANTECUBITAL Performed at [facility], [address], [place], [place] [address]    Special Requests   Final    BOTTLES DRAWN AEROBIC AND ANAEROBIC Blood Culture adequate volume Performed at [facility], [address], [place], [place] [address]    Culture  Setup Time   Final    GRAM NEGATIVE RODS IN BOTH AEROBIC AND ANAEROBIC BOTTLES CRITICAL RESULT CALLED TO, READ BACK BY AND VERIFIED WITH: Hart Robinsons  [facility] [address] 05/20/20 HNM Performed at [facility], [address] [place], [place] [address]    Culture KLEBSIELLA PNEUMONIAE ESCHERICHIA COLI  (A)  Final   Report Status 05/23/2020 FINAL  Final   Organism ID, Bacteria KLEBSIELLA PNEUMONIAE  Final   Organism ID, Bacteria ESCHERICHIA COLI  Final      Susceptibility   Escherichia coli - MIC*    AMPICILLIN 8 SENSITIVE Sensitive     CEFAZOLIN <=4 SENSITIVE Sensitive     CEFEPIME <=0.12 SENSITIVE Sensitive     CEFTAZIDIME <=1 SENSITIVE Sensitive     CEFTRIAXONE <=0.25 SENSITIVE Sensitive     CIPROFLOXACIN <=0.25 SENSITIVE Sensitive     GENTAMICIN <=1 SENSITIVE Sensitive     IMIPENEM <=0.25 SENSITIVE Sensitive     TRIMETH/SULFA <=20 SENSITIVE Sensitive     AMPICILLIN/SULBACTAM <=2 SENSITIVE Sensitive     PIP/TAZO <=4 SENSITIVE Sensitive     * ESCHERICHIA COLI   Klebsiella pneumoniae - MIC*    AMPICILLIN RESISTANT Resistant     CEFAZOLIN <=4 SENSITIVE Sensitive     CEFEPIME <=0.12 SENSITIVE Sensitive     CEFTAZIDIME <=1 SENSITIVE Sensitive     CEFTRIAXONE <=0.25 SENSITIVE Sensitive     CIPROFLOXACIN <=0.25 SENSITIVE Sensitive     GENTAMICIN <=1 SENSITIVE Sensitive     IMIPENEM <=0.25 SENSITIVE Sensitive     TRIMETH/SULFA <=20 SENSITIVE Sensitive     AMPICILLIN/SULBACTAM 4 SENSITIVE Sensitive     PIP/TAZO <=4 SENSITIVE Sensitive     * KLEBSIELLA PNEUMONIAE  Blood Culture ID Panel (Reflexed)     Status: Abnormal   Collection Time: 05/19/20  3:02 PM  Result Value Ref Range Status   Enterococcus species  NOT DETECTED NOT DETECTED Final   Listeria monocytogenes NOT DETECTED NOT DETECTED Final   Staphylococcus species NOT DETECTED NOT DETECTED Final   Staphylococcus aureus (BCID) NOT DETECTED NOT DETECTED Final   Streptococcus species NOT DETECTED NOT DETECTED Final   Streptococcus agalactiae NOT DETECTED NOT DETECTED Final   Streptococcus pneumoniae NOT DETECTED NOT DETECTED Final   Streptococcus pyogenes NOT DETECTED NOT  DETECTED Final   Acinetobacter baumannii NOT DETECTED NOT DETECTED Final   Enterobacteriaceae species DETECTED (A) NOT DETECTED Final    Comment: CRITICAL RESULT CALLED TO, READ BACK BY AND VERIFIED WITH:  SCOTT HALL PHARMD 0335 05/20/20 HNM    Enterobacter cloacae complex NOT DETECTED NOT DETECTED Final   Escherichia coli DETECTED (A) NOT DETECTED Final    Comment: CRITICAL RESULT CALLED TO, READ BACK BY AND VERIFIED WITH: SCOTT HALL PHARMD 0335 05/20/20 HNM    Klebsiella oxytoca NOT DETECTED NOT DETECTED Final   Klebsiella pneumoniae DETECTED (A) NOT DETECTED Final    Comment: CRITICAL RESULT CALLED TO, READ BACK BY AND VERIFIED WITH: SCOTT HALL PHARMD 0335 05/20/20 HNM    Proteus species NOT DETECTED NOT DETECTED Final   Serratia marcescens NOT DETECTED NOT DETECTED Final   Carbapenem resistance NOT DETECTED NOT DETECTED Final   Haemophilus influenzae NOT DETECTED NOT DETECTED Final   Neisseria meningitidis NOT DETECTED NOT DETECTED Final   Pseudomonas aeruginosa NOT DETECTED NOT DETECTED Final   Candida albicans NOT DETECTED NOT DETECTED Final   Candida glabrata NOT DETECTED NOT DETECTED Final   Candida krusei NOT DETECTED NOT DETECTED Final   Candida parapsilosis NOT DETECTED NOT DETECTED Final   Candida tropicalis NOT DETECTED NOT DETECTED Final    Comment: Performed at [facility], [address] [place], [place] [address]  CULTURE, BLOOD (ROUTINE X 2) w Reflex to ID Panel     Status: Abnormal   Collection Time: 05/19/20  3:14 PM   Specimen: BLOOD  Result Value Ref Range Status   Specimen Description   Final    BLOOD BLOOD RIGHT HAND Performed at [facility], [address], [place], [place] [address]    Special Requests   Final    BOTTLES DRAWN AEROBIC AND ANAEROBIC Blood Culture adequate volume Performed at [facility], [address] [place], [place] [address]    Culture  Setup Time   Final    GRAM NEGATIVE RODS IN BOTH AEROBIC AND  ANAEROBIC BOTTLES CRITICAL VALUE NOTED.  VALUE IS CONSISTENT WITH PREVIOUSLY REPORTED AND CALLED VALUE. Performed at [facility], [address] [place],  [address]    Culture KLEBSIELLA PNEUMONIAE (A)  Final   Report Status 05/22/2020 FINAL  Final   Organism ID, Bacteria KLEBSIELLA PNEUMONIAE  Final      Susceptibility   Klebsiella pneumoniae - MIC*    AMPICILLIN RESISTANT Resistant     CEFAZOLIN <=4 SENSITIVE Sensitive     CEFEPIME <=0.12 SENSITIVE Sensitive     CEFTAZIDIME <=1 SENSITIVE Sensitive     CEFTRIAXONE <=0.25 SENSITIVE Sensitive     CIPROFLOXACIN <=0.25 SENSITIVE Sensitive     GENTAMICIN <=1 SENSITIVE Sensitive     IMIPENEM <=0.25 SENSITIVE Sensitive     TRIMETH/SULFA <=20 SENSITIVE Sensitive     AMPICILLIN/SULBACTAM 4 SENSITIVE Sensitive     PIP/TAZO <=4 SENSITIVE Sensitive     * KLEBSIELLA PNEUMONIAE  MRSA PCR Screening     Status: None   Collection Time: 05/21/20  5:24 PM   Specimen: Nasopharyngeal  Result  Value Ref Range Status   MRSA by PCR NEGATIVE NEGATIVE Final    Comment:        The GeneXpert MRSA Assay (FDA approved for NASAL specimens only), is one component of a comprehensive MRSA colonization surveillance program. It is not intended to diagnose MRSA infection nor to guide or monitor treatment for MRSA infections. Performed at [facility], [address] [place], [place] [address]      Coagulation Studies: No results for input(s): LABPROT, INR in the last 72 hours.  Urinalysis: No results for input(s): COLORURINE, LABSPEC, PHURINE, GLUCOSEU, HGBUR, BILIRUBINUR, KETONESUR, PROTEINUR, UROBILINOGEN, NITRITE, LEUKOCYTESUR in the last 72 hours.  Invalid input(s): APPERANCEUR      Imaging: No results found.   Assessment & Plan: Pt is a 74 y.o.  medical problems of Pancreatic neuroendocrine tumor, chronic pancreatitis, atrial flutter status post ablation, requiring anticoagulation with Eliquis, hypertension,  coronary disease, COPD, insulin-dependent type 2 diabetes, CKD stage IIIb,   male with , was admitted on 05/18/2020 with Epigastric pain [R10.13] Abdominal pain [R10.9] Malignant neoplasm of pancreas, unspecified location of malignancy ([place]) [C25.9]  #.  Acute kidney injury on chronic kidney disease stage IV Baseline creatinine 2.6/GFR 27  Urinalysis (July 4) proteinuria 100 mg/dL, 0-5 RBCs, 0-5 WBC Imaging: July 5 CT abdomen: Pancreatitis, pancreatic pseudocysts, bile duct distention, left adrenal mass, mild perinephric stranding on the left.  No hydronephrosis  Acute kidney injury is likely secondary to concurrent illness, sepsis leading to ATN  -Patient continues to have diminished renal function with a BUN of 93 and creatinine of 4.92 however he is continuing to have good urine output 1 3 L.  Therefore no urgent indication for dialysis at the moment.  Continue to monitor renal parameters daily.  #Acute respiratory failure Patient continues to do well post extubation.  Continue to monitor respiratory status  #Sepsis Klebsiella bacteremia from July 5.   Continue Unasyn. #Pancreatic neuroendocrine tumor with pseudocyst, worsening biliary duct dilatation,  patient underwent ERCP on July 7 where occluded stent and purulent drainage in the common bile duct He underwent pus drainage from the biliary tree, stent placement in the common bile duct 7/7    LOS: 8 Mekel Haverstock 7/13/202111:13 AM    Note: This note was prepared with Dragon dictation. Any transcription errors are unintentional

## 2020-05-27 NOTE — NC FL2 (Signed)
[place] LEVEL OF CARE SCREENING TOOL     IDENTIFICATION  Patient Name: [name] Birthdate: August 01, 1946 Sex: male Admission Date (Current Location): 05/18/2020  [employer] and [place] Number:  Engineering geologist and Address:         Provider Number: [id number]  Attending Physician Name and Address:  Fritzi Mandes, MD  Relative Name and Phone Number:       Current Level of Care: Hospital Recommended Level of Care: [place] Prior Approval Number:    Date Approved/Denied:   PASRR Number: [id number]  Discharge Plan: SNF    Current Diagnoses: Patient Active Problem List   Diagnosis Date Noted  . Sepsis ([place]) 05/21/2020  . Bacteremia 05/21/2020  . Malignant neoplasm of pancreas ([place])   . Acute cholangitis   . Abdominal pain 05/19/2020  . Acute kidney injury superimposed on chronic kidney disease ([place]) 05/19/2020  . Transaminitis 05/19/2020  . Protein-calorie malnutrition, severe 05/19/2020  . Neoplasm related pain   . Primary pancreatic neuroendocrine tumor   . 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 Aortic atherosclerosis ([place]) 03/21/2020  . Chronic respiratory failure with hypoxia ([place]) 03/21/2020  . Thrombocytopenia, unspecified ([place]) 03/21/2020  . Coronary artery disease of native artery of native heart with stable angina pectoris ([place]) 03/21/2020  . Secondary hyperparathyroidism of renal origin ([place]) 08/20/2019  . Stage 3b chronic kidney disease 08/20/2019  . Elevated serum creatinine 07/03/2019  . Acute pancreatitis 11/25/2018  . S/P ablation of atrial flutter 10/15/2017  . Atrial flutter ([place]) 10/14/2017  . Leukocytosis 11/19/2016  . Erythrocytosis 11/19/2016  . Diabetes type 2, uncontrolled ([place]) 09/01/2015  . 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 CAD (coronary artery disease) 09/01/2015  . Left main coronary artery disease 09/01/2015  . Chronic obstructive pulmonary disease  ([place]) 01/02/2015  . Encounter for screening colonoscopy 01/02/2015  . Typical atrial flutter ([place]) 11/27/2014  . Smoking history 11/27/2014  . Emphysema of lung ([place]) 11/27/2014  . Pneumonia, organism unspecified([place]) 11/27/2014  . Essential hypertension 11/27/2014  . Adrenal nodule ([place]) 06/11/2014  . B12 deficiency 06/11/2014  . Hypertriglyceridemia 06/11/2014  . Microalbuminuria 06/11/2014  . Type 2 diabetes mellitus ([place]) 06/06/2014    Orientation RESPIRATION BLADDER Height & Weight     Self, Time, Situation, Place  Normal Continent Weight: 63.9 kg Height:  5\' 7"  (170.2 cm)  BEHAVIORAL SYMPTOMS/MOOD NEUROLOGICAL BOWEL NUTRITION STATUS      Continent Diet (soft)  AMBULATORY STATUS COMMUNICATION OF NEEDS Skin   Extensive Assist Verbally Normal                       Personal Care Assistance Level of Assistance              Functional Limitations Info             SPECIAL CARE FACTORS FREQUENCY  PT (By licensed PT), OT (By licensed OT) (Waiting to hear from oncology if he will require any treatment for cancer)                    Contractures Contractures Info: Not present    Additional Factors Info  Code Status, Allergies Code Status Info: DNR Allergies Info: Levaquin           Current Medications (05/27/2020):  This is the current hospital active medication list Current Facility-Administered Medications  Medication Dose Route Frequency Provider Last Rate Last Admin  . 0.9 %  sodium chloride infusion   Intravenous PRN Swayze, Ava, DO   Stopped at 05/27/20 0137  . 0.9 %  sodium chloride infusion  250 mL Intravenous Continuous Aleskerov, Fuad, MD      . albumin human 25 % solution 12.5 g  12.5 g Intravenous Daily Ottie Glazier, MD 60 mL/hr at 05/27/20 1131 12.5 g at 05/27/20 1131  . Ampicillin-Sulbactam (UNASYN) 3 g in sodium chloride 0.9 % 100 mL IVPB  3 g Intravenous Q24H Tsosie Billing, MD   Stopped at 05/26/20 2241  . feeding  supplement (GLUCERNA SHAKE) (GLUCERNA SHAKE) liquid 237 mL  237 mL Oral TID BM Fritzi Mandes, MD   237 mL at 05/27/20 1449  . insulin aspart (novoLOG) injection 0-9 Units  0-9 Units Subcutaneous TID WC & HS Ottie Glazier, MD   3 Units at 05/26/20 2159  . insulin glargine (LANTUS) injection 12 Units  12 Units Subcutaneous QHS Tu, Ching T, DO   12 Units at 05/26/20 2159  . levothyroxine (SYNTHROID) tablet 25 mcg  25 mcg Oral q morning - 10a Tu, Ching T, DO   25 mcg at 05/27/20 0515  . mometasone-formoterol (DULERA) 100-5 MCG/ACT inhaler 2 puff  2 puff Inhalation BID Tu, Ching T, DO   2 puff at 05/27/20 1122  . ondansetron (ZOFRAN) injection 4 mg  4 mg Intravenous Q6H PRN Tu, Ching T, DO   4 mg at 05/23/20 1854  . pantoprazole (PROTONIX) EC tablet 40 mg  40 mg Oral Daily Fritzi Mandes, MD   40 mg at 05/27/20 1122  . traMADol (ULTRAM) tablet 50 mg  50 mg Oral Q6H PRN Fritzi Mandes, MD         Discharge Medications: Please see discharge summary for a list of discharge medications.  Relevant Imaging Results:  Relevant Lab Results:   Additional Information ss [id number]  Beverly Sessions, RN

## 2020-05-27 NOTE — Progress Notes (Signed)
Inpatient Diabetes Program Recommendations  AACE/ADA: New Consensus Statement on Inpatient Glycemic Control  Target Ranges:  Prepandial:   less than 140 mg/dL      Peak postprandial:   less than 180 mg/dL (1-2 hours)      Critically ill patients:  140 - 180 mg/dL   Results for [name], [name] (MRN [id number]) as of 05/27/2020 09:38  Ref. Range 05/26/2020 07:39 05/26/2020 11:55 05/26/2020 16:23 05/26/2020 21:31 05/27/2020 07:51  Glucose-Capillary Latest Ref Range: 70 - 99 mg/dL 155 (H) 238 (H) 282 (H) 220 (H) 100 (H)   Review of Glycemic Control  Diabetes history: DM2 Outpatient Diabetes medications: Lantus 5 units QHS Current orders for Inpatient glycemic control: Lantus 12 units QHS, Novolog 0-9 units AC&HS; Solucortef 50 mg daily  Inpatient Diabetes Program Recommendations:    Insulin-Meal Coverage: If steroids are continued, please consider ordering Novolog 4 units TID with meals for meal coverage if patient eats at least 50% of meals.  Thanks, Barnie Alderman, RN, MSN, CDE Diabetes Coordinator Inpatient Diabetes Program [phone] (Team Pager from 8am to 5pm)

## 2020-05-27 NOTE — Progress Notes (Signed)
[facility] [place] NAME: [name]    MR#:  [id number]  DATE OF BIRTH:  Dec 16, 1945  SUBJECTIVE:  sitting up in the bed watching TV niece [name] in the room patient denies any symptoms. Tells me he worked with physical therapy and eager to go to rehab  REVIEW OF SYSTEMS:   Review of Systems  Constitutional: Negative for chills, fever and weight loss.  HENT: Negative for ear discharge, ear pain and nosebleeds.   Eyes: Negative for blurred vision, pain and discharge.  Respiratory: Negative for sputum production, shortness of breath, wheezing and stridor.   Cardiovascular: Negative for chest pain, palpitations, orthopnea and PND.  Gastrointestinal: Negative for abdominal pain, diarrhea, nausea and vomiting.  Genitourinary: Negative for frequency and urgency.  Musculoskeletal: Negative for back pain and joint pain.  Neurological: Negative for sensory change, speech change, focal weakness and weakness.  Psychiatric/Behavioral: Negative for depression and hallucinations. The patient is not nervous/anxious.    Tolerating Diet:yes Tolerating PT: rehab  DRUG ALLERGIES:   Allergies  Allergen Reactions  . Levaquin [Levofloxacin In D5w] Swelling and Rash    VITALS:  Blood pressure (!) 109/59, pulse 66, temperature 97.6 F (36.4 C), temperature source Oral, resp. rate 17, height 5\' 7"  (1.702 m), weight 63.9 kg, SpO2 98 %.  PHYSICAL EXAMINATION:   Physical Exam  GENERAL:  74 y.o.-year-old patient lying in the bed with no acute distress.  EYES: Pupils equal, round, reactive to light and accommodation. ++ scleral icterus.   HEENT: Head atraumatic, normocephalic. Oropharynx and nasopharynx clear.  NECK:  Supple, no jugular venous distention. No thyroid enlargement, no tenderness.  LUNGS: Normal breath sounds bilaterally, no wheezing, rales, rhonchi. No use of accessory muscles of respiration.  CARDIOVASCULAR: S1, S2 normal. No  murmurs, rubs, or gallops.  ABDOMEN: Soft, nontender, nondistended. Bowel sounds present. No organomegaly or mass. EXTREMITIES: No cyanosis, clubbing or edema b/l.    NEUROLOGIC: Cranial nerves II through XII are intact. No focal Motor or sensory deficits b/l.   PSYCHIATRIC:  patient is alert and oriented x 3.  SKIN: jaundice +  LABORATORY PANEL:  CBC Recent Labs  Lab 05/26/20 1207  WBC 14.8*  HGB 7.6*  HCT 23.7*  PLT 63*    Chemistries  Recent Labs  Lab 05/26/20 1207 05/26/20 1207 05/27/20 0505  NA 132*   < > 136  K 3.4*   < > 3.7  CL 94*   < > 97*  CO2 26   < > 27  GLUCOSE 243*   < > 125*  BUN 92*   < > 93*  CREATININE 5.06*   < > 4.92*  CALCIUM 7.2*   < > 7.5*  MG  --   --  2.0  AST 200*  --   --   ALT 219*  --   --   ALKPHOS 646*  --   --   BILITOT 14.0*  --   --    < > = values in this interval not displayed.   Cardiac Enzymes No results for input(s): TROPONINI in the last 168 hours. RADIOLOGY:  No results found. ASSESSMENT AND PLAN:   Mr. [name] is a 74 year old male with a past medical history significant for atrial flutter s/p DCCV x 3 and ablation in 2017/2018, anticoagulated with Eliquis, coronary artery disease detected by chest CT in 2020, HFpEF, oxygen dependent COPD, hypothyroidism, type 2 diabetes, hypertension, and recently diagnosed pancreatic cancer  who presented to the ED on 05/18/20 for epigastric pain with associated nausea and vomiting.Abdominal CT revealed pancreatitis with a presumed pseudocyst with worsening of the biliary ductal dilatation. Blood cultures were positive for polymicrobial bacteremia. Blood cultures having both Klebsiella and E. Coli  E. coli/Klebsiella sepsis secondary to cholangitis with obstructive jaundice in the setting of pancreatic neuroendocrine tumor -patient was transferred out of ICU. Came in with septic shock-- now resolved. -Will discontinue IV Solucortef today -IV unasyn per Dr. Delaine Galvan -no fever -white  count down to 14K  Acute on chronic renal failure stage IIIa -patient had required dialysis during his previous admission at [facility] good urine -removed Foley -appreciate Dr. Elwyn Galvan input -avoid nephrotoxins  Severe protein calorie malnutrition -follow dietitian recommendation  Biliary obstruction with jaundice secondary to pancreatic neuroendocrine tumor -patient is not a candidate for any surgery -underwent ERCP with biliary stent placement-- direct bilirubin continues to be trending down -9.6--6.2 today -- -follows by oncology Dr. Janese Galvan-- Octreotide shot times one. Patient will follow-up with Dr. Janese Galvan as outpatient for further [facility] rx--this was d/w Ms [name] -patient overall has a poor prognosis -per ICU attending note-- discussion was made with patient's nieces Ms. [name] and Ms. [name] and they chose no aggressive measures.. treat the treatable -patient is DNR -Per GI Dr [name]--direct bilirubin trending down hold off on further imaging at present.  DVT prophylaxis SCD  Hypothyroidism continue Synthroid  Type II diabetes with renal disease continue sliding scale and Lantus  Foley and CL --removed on 05/27/2020 TOC for d/c planning to rehab--niece and pt agrees  Procedures:ERCP Family communication :Niece and POA Ms [name] Consults : ID, oncology, G.I. CODE STATUS: DNR DVT Prophylaxis : SCD  Status is: Inpatient  Remains inpatient appropriate because:Unsafe d/c plan and IV treatments appropriate due to intensity of illness or inability to take PO   Dispo: The patient is from: Home              Anticipated d/c is to: TBD              Anticipated d/c date is: 2 days              Patient currently is not medically stable to d/c. TOC to start discharge planning to rehab. Discussed with patient and knees agreement with plan. Patient will follow-up with Dr. Janese Galvan at the cancer center.     TOTAL TIME TAKING CARE OF THIS PATIENT: 76 minutes.  >50% time spent on  counselling and coordination of care  Note: This dictation was prepared with Dragon dictation along with smaller phrase technology. Any transcriptional errors that result from this process are unintentional.  [name] M.D    [facility]   CC: Primary care physician; [name], MDPatient ID: [name], male   DOB: [birth year], 74 y.o.   MRN: [id number]

## 2020-05-27 NOTE — Consult Note (Signed)
Consultation Note Date: 05/27/2020   Patient Name: [name]  DOB: May 30, 1946  MRN: [id number]  Age / Sex: 74 y.o., male  PCP: [name], MD Referring Physician: Fritzi Mandes, MD  Reason for Consultation: Establishing goals of care  HPI/Patient Profile:  [name] is a 74 y.o. male with medical history significant for pancreatic neuroendocrine tumor, chronic pancreatitis, atrial flutter s/p ablation on Eliquis, hypertension, CAD, COPD, insulin-dependent type 2 diabetes, CKD stage IIIb who presents with worsening abdominal pain.  Clinical Assessment and Goals of Care: Patient is resting in bed. No family at bedside. He tells me his full name, that he is at [facility], it is July 2021, and why he is here. He states he is doing well.   We discussed his diagnosis, prognosis, GOC, EOL wishes disposition and options.  A detailed discussion was had today regarding advanced directives.  Concepts specific to code status, artifical feeding and hydration, IV antibiotics and rehospitalization were discussed.  The difference between an aggressive medical intervention path and a comfort care path was discussed.  Values and goals of care important to patient and family were attempted to be elicited.  Discussed limitations of medical interventions to prolong quality of life in some situations and discussed the concept of human mortality.  He states he knows he does not have a lot of time left. He states he wants to try to stay on earth as long as possible. He states that if he can get even another couple of months it would be worth continuing aggressive care. He has no specific goal for time remaining on earth. He confirms he would not want resuscitative efforts.  He states at this time, he does not have any pain or other symptoms. He states if he began having uncontrolled pain and suffering, he  would want to transition to comfort focused care. He states he is still independent, and if he came to a place of needing assistance with ADL's or was bedbound, he would want to shift to comfort.     SUMMARY OF RECOMMENDATIONS    Continue full scope treatment as able. He knows he does not have a lot of time left, but states if he can get even another couple of months it would be worth continuing aggressive care. DNR.   Recommend palliative at D/C.   Prognosis:   < 6 months      Primary Diagnoses: Present on Admission: . Abdominal pain . Typical atrial flutter ([place]) . Essential hypertension . Chronic obstructive pulmonary disease ([place]) . Diabetes type 2, uncontrolled ([place]) . CAD (coronary artery disease) . Acute kidney injury superimposed on chronic kidney disease ([place]) . Transaminitis . Neoplasm related pain . Primary pancreatic neuroendocrine tumor . Protein-calorie malnutrition, severe . Acute cholangitis . Sepsis ([place]) . Bacteremia   I have reviewed the medical record, interviewed the patient and family, and examined the patient. The following aspects are pertinent.  Past Medical History:  Diagnosis Date  . Aortic atherosclerosis ([place])    As seen  on 04/2019 CT  . Atrial flutter ([place])    a. s/p ablation 2017/2018 and DCCV 2015/2016/2019 b. 2017 echo EF 60-65%, no RWMA, mild MR,  . CAD (coronary artery disease)    3v CAD 04/2019 CT  . Carcinoid tumor    metastatic  . CKD (chronic kidney disease), stage III   . COPD (chronic obstructive pulmonary disease) ([facility])    PM oxygen  . Current occasional smoker    1-2 cigarettes / week  . Diabetes mellitus type 2, insulin dependent ([place])   . Heart failure with preserved ejection fraction ([place])    2017 echo EF 60-65%, no RWMA, mild MR,    . Hyperlipidemia   . Hypertension   . Pancreatitis   . Polycythemia    Social History   Socioeconomic History  . Marital status: Widowed    Spouse name: Not on file  . Number of  children: Not on file  . Years of education: Not on file  . Highest education level: Not on file  Occupational History  . Not on file  Tobacco Use  . Smoking status: Light Tobacco Smoker    Years: 45.00    Types: Cigarettes    Last attempt to quit: 11/27/2012    Years since quitting: 7.5  . Smokeless tobacco: Never Used  Vaping Use  . Vaping Use: Never used  Substance and Sexual Activity  . Alcohol use: Yes    Alcohol/week: 2.0 standard drinks    Types: 2 Cans of beer per week    Comment: beer every once a while once a monthnone last 24hrs  . Drug use: No  . Sexual activity: Not on file  Other Topics Concern  . Not on file  Social History Narrative  . Not on file   Social Determinants of Health   Financial Resource Strain:   . Difficulty of Paying Living Expenses:   Food Insecurity:   . Worried About Charity fundraiser in the Last Year:   . Arboriculturist in the Last Year:   Transportation Needs:   . Film/video editor (Medical):   Marland Kitchen Lack of Transportation (Non-Medical):   Physical Activity:   . Days of Exercise per Week:   . Minutes of Exercise per Session:   Stress:   . Feeling of Stress :   Social Connections:   . Frequency of Communication with Friends and Family:   . Frequency of Social Gatherings with Friends and Family:   . Attends Religious Services:   . Active Member of Clubs or Organizations:   . Attends Archivist Meetings:   Marland Kitchen Marital Status:    Family History  Problem Relation Age of Onset  . Hypertension Father    Scheduled Meds: . feeding supplement (GLUCERNA SHAKE)  237 mL Oral TID BM  . insulin aspart  0-9 Units Subcutaneous TID WC & HS  . insulin glargine  12 Units Subcutaneous QHS  . levothyroxine  25 mcg Oral q morning - 10a  . mometasone-formoterol  2 puff Inhalation BID  . pantoprazole  40 mg Oral Daily   Continuous Infusions: . sodium chloride Stopped (05/27/20 0137)  . sodium chloride    . albumin human 12.5 g  (05/27/20 1131)  . ampicillin-sulbactam (UNASYN) IV Stopped (05/26/20 2241)   PRN Meds:.sodium chloride, ondansetron (ZOFRAN) IV, traMADol Medications Prior to Admission:  Prior to Admission medications   Medication Sig Start Date End Date Taking? Authorizing Provider  acetaminophen (TYLENOL) 500 MG tablet  Take 500-1,000 mg by mouth every 6 (six) hours as needed for moderate pain or headache.     [provider]  albuterol (PROAIR HFA) 108 (90 Base) MCG/ACT inhaler Inhale 2 puffs into the lungs every 4 (four) hours as needed for wheezing or shortness of breath.     [provider]  amiodarone (PACERONE) 200 MG tablet Take 1 tablet (200 mg total) by mouth daily. 04/17/20   [name], MD  apixaban (ELIQUIS) 2.5 MG TABS tablet Take 1 tablet (2.5 mg total) by mouth 3 (three) times daily. 04/25/20   [name], MD  budesonide-formoterol (SYMBICORT) 80-4.5 MCG/ACT inhaler Inhale 2 puffs into the lungs 2 (two) times daily. Takes at noon and at 4pm.    [provider]  EUTHYROX 25 MCG tablet Take 1 tablet (25 mcg total) by mouth every morning. 04/17/20   [name], MD  Insulin Glargine (LANTUS SOLOSTAR) 100 UNIT/ML Solostar Pen Inject 5 Units into the skin at bedtime.  11/14/19 11/13/20  [provider]  levothyroxine (SYNTHROID, LEVOTHROID) 50 MCG tablet Take 25 mcg by mouth daily.  06/13/18   [provider]  metoprolol succinate (TOPROL-XL) 50 MG 24 hr tablet TAKE 1 TABLET BY MOUTH IN THE EVENING. TAKE WITH OR IMMEDIATELY FOLLOWING A MEAL 04/08/20   Marrianne Mood D, PA-C  midodrine (PROAMATINE) 10 MG tablet Take 1 tablet (10 mg total) by mouth 3 (three) times daily. 04/21/20   [name], MD  omeprazole (PRILOSEC) 20 MG capsule Take 1 capsule by mouth once daily 04/15/20   [name], MD  rosuvastatin (CRESTOR) 40 MG tablet Take 1 tablet (40 mg total) by mouth daily. 04/17/20   [name], MD  vitamin B-12 (CYANOCOBALAMIN) 500 MCG tablet Take 500  mcg by mouth daily.     [provider]   Allergies  Allergen Reactions  . Levaquin [Levofloxacin In D5w] Swelling and Rash   Review of Systems  All other systems reviewed and are negative.   Physical Exam Pulmonary:     Effort: Pulmonary effort is normal.  Neurological:     Mental Status: He is alert.     Vital Signs: BP (!) 109/59   Pulse 66   Temp 97.6 F (36.4 C) (Oral)   Resp 17   Ht 5\' 7"  (1.702 m)   Wt 63.9 kg   SpO2 98%   BMI 22.06 kg/m  Pain Scale: 0-10 POSS *See Group Information*: 1-Acceptable,Awake and alert Pain Score: 0-No pain   SpO2: SpO2: 98 % O2 Device:SpO2: 98 % O2 Flow Rate: .O2 Flow Rate (L/min): 2 L/min  IO: Intake/output summary:   Intake/Output Summary (Last 24 hours) at 05/27/2020 1414 Last data filed at 05/27/2020 1300 Gross per 24 hour  Intake 612.52 ml  Output 1620 ml  Net -1007.48 ml    LBM: Last BM Date: 05/27/20 Baseline Weight: Weight: 65.8 kg Most recent weight: Weight: 63.9 kg     Palliative Assessment/Data:      Time In: 1:50 Time Out: 2:20 Time Total: 30 min Greater than 50%  of this time was spent counseling and coordinating care related to the above assessment and plan.  Signed by: Asencion Gowda, NP   Please contact Palliative Medicine Team phone at [phone] for questions and concerns.  For individual provider: See Shea Evans

## 2020-05-27 NOTE — Consult Note (Signed)
PHARMACY CONSULT NOTE  Pharmacy Consult for Electrolyte Monitoring and Replacement   Recent Labs: Potassium (mmol/L)  Date Value  05/27/2020 3.7  11/01/2014 3.8   Magnesium (mg/dL)  Date Value  05/27/2020 2.0  10/30/2014 1.8   Calcium (mg/dL)  Date Value  05/27/2020 7.5 (L)   Calcium, Total (mg/dL)  Date Value  11/01/2014 9.4   Albumin (g/dL)  Date Value  05/27/2020 2.1 (L)  10/29/2014 3.0 (L)   Phosphorus (mg/dL)  Date Value  05/27/2020 7.1 (H)   Sodium (mmol/L)  Date Value  05/27/2020 136  10/11/2017 140  11/01/2014 138   Corrected Ca: 8.5 mg/dL  Assessment: 74 y.o. male with a history of CKD, COPD, atrial flutter s/p DCCV x 3 and ablation in 2017/2018, anticoagulated with Eliquis, CAD, diabetes, pancreatic cancer, recently diagnosed who presents with epigastric abdominal pain now with E coli and Klebsiella bacteremia.   Goal of Therapy:  Potassium 4.0 - 5.1 mmol/L Magnesium 2.0 - 2.4 mg/dL All Other Electrolytes WNL  Plan:  K 3.7  Mag 2.0  Phos 7.1  Scr 4.92  No replacement at this time  F/U electrolytes in am 7/13  Noralee Space ,PharmD Clinical Pharmacist 05/27/2020 9:33 AM

## 2020-05-27 NOTE — Progress Notes (Signed)
Hematology/Oncology Consult note [facility]  Telephone:([phone] [phone]  Patient Care Team: [name], MD as PCP - General (Cardiology) [name], MD as PCP - Cardiology (Cardiology) [name], MD as Consulting Physician (Cardiology) Clent Jacks, RN as Registered Nurse   Name of the patient: [name]  [id number]  10-05-1946   Date of visit: 05/27/20  Interval history-reports feeling better.Has remained afebrile.  Not tachycardic still feels quite fatigued and deconditioned.  Denies any abdominal pain  ECOG PS- 3 Pain scale- 0  Review of systems- Review of Systems  Constitutional: Positive for malaise/fatigue. Negative for chills, fever and weight loss.  HENT: Negative for congestion, ear discharge and nosebleeds.   Eyes: Negative for blurred vision.  Respiratory: Negative for cough, hemoptysis, sputum production, shortness of breath and wheezing.   Cardiovascular: Negative for chest pain, palpitations, orthopnea and claudication.  Gastrointestinal: Negative for abdominal pain, blood in stool, constipation, diarrhea, heartburn, melena, nausea and vomiting.  Genitourinary: Negative for dysuria, flank pain, frequency, hematuria and urgency.  Musculoskeletal: Negative for back pain, joint pain and myalgias.  Skin: Negative for rash.  Neurological: Negative for dizziness, tingling, focal weakness, seizures, weakness and headaches.  Endo/Heme/Allergies: Does not bruise/bleed easily.  Psychiatric/Behavioral: Negative for depression and suicidal ideas. The patient does not have insomnia.       Allergies  Allergen Reactions  . Levaquin [Levofloxacin In D5w] Swelling and Rash     Past Medical History:  Diagnosis Date  . Aortic atherosclerosis ([place])    As seen on 04/2019 CT  . Atrial flutter ([place])    a. s/p ablation 2017/2018 and DCCV 2015/2016/2019 b. 2017 echo EF 60-65%, no RWMA, mild MR,  . CAD  (coronary artery disease)    3v CAD 04/2019 CT  . Carcinoid tumor    metastatic  . CKD (chronic kidney disease), stage III   . COPD (chronic obstructive pulmonary disease) (HCC)    PM oxygen  . Current occasional smoker    1-2 cigarettes / week  . Diabetes mellitus type 2, insulin dependent ([place])   . Heart failure with preserved ejection fraction ([place])    2017 echo EF 60-65%, no RWMA, mild MR,    . Hyperlipidemia   . Hypertension   . Pancreatitis   . Polycythemia      Past Surgical History:  Procedure Laterality Date  . A-FLUTTER ABLATION N/A 10/14/2017   Procedure: A-FLUTTER ABLATION;  Surgeon: Constance Haw, MD;  Location: [place] [facility];  Service: Cardiovascular;  Laterality: N/A;  . CARDIOVERSION N/A 09/08/2017   Procedure: CARDIOVERSION;  Surgeon: [name], MD;  Location: [facility] OR[facility];  Service: Cardiovascular;  Laterality: N/A;  . CARDIOVERSION N/A 03/22/2018   Procedure: CARDIOVERSION;  Surgeon: [name], MD;  Location: [facility];  Service: Cardiovascular;  Laterality: N/A;  . COLONOSCOPY    . COLONOSCOPY WITH PROPOFOL N/A 10/31/2019   Procedure: COLONOSCOPY WITH PROPOFOL;  Surgeon: Lin Landsman, MD;  Location: [facility] ENDOSCOPY;  Service: Gastroenterology;  Laterality: N/A;  . ELECTROPHYSIOLOGIC STUDY N/A 10/27/2016   Procedure: A-Flutter Ablation;  Surgeon: Deboraha Sprang, MD;  Location: [place] [facility];  Service: Cardiovascular;  Laterality: N/A;  . ENDOSCOPIC RETROGRADE CHOLANGIOPANCREATOGRAPHY (ERCP) WITH PROPOFOL N/A 05/21/2020   Procedure: ENDOSCOPIC RETROGRADE CHOLANGIOPANCREATOGRAPHY (ERCP) WITH PROPOFOL;  Surgeon: [name], MD;  Location: [facility] ENDOSCOPY;  Service: Endoscopy;  Laterality: N/A;  . EUS N/A 12/22/2017   Procedure: FULL UPPER ENDOSCOPIC ULTRASOUND (EUS)  RADIAL;  Surgeon: Reita Cliche, MD;  Location: [name]COPY;  Service: Gastroenterology;  Laterality: N/A;  . TEE WITH CARDIOVERSION  12/16  . TEMPORARY DIALYSIS  CATHETER N/A 02/08/2020   Procedure: TEMPORARY DIALYSIS CATHETER;  Surgeon: Algernon Huxley, MD;  Location: [place] [facility];  Service: Cardiovascular;  Laterality: N/A;    Social History   Socioeconomic History  . Marital status: Widowed    Spouse name: Not on file  . Number of children: Not on file  . Years of education: Not on file  . Highest education level: Not on file  Occupational History  . Not on file  Tobacco Use  . Smoking status: Light Tobacco Smoker    Years: 45.00    Types: Cigarettes    Last attempt to quit: 11/27/2012    Years since quitting: 7.5  . Smokeless tobacco: Never Used  Vaping Use  . Vaping Use: Never used  Substance and Sexual Activity  . Alcohol use: Yes    Alcohol/week: 2.0 standard drinks    Types: 2 Cans of beer per week    Comment: beer every once a while once a monthnone last 24hrs  . Drug use: No  . Sexual activity: Not on file  Other Topics Concern  . Not on file  Social History Narrative  . Not on file   Social Determinants of Health   Financial Resource Strain:   . Difficulty of Paying Living Expenses:   Food Insecurity:   . Worried About Charity fundraiser in the Last Year:   . Arboriculturist in the Last Year:   Transportation Needs:   . Film/video editor (Medical):   Marland Kitchen Lack of Transportation (Non-Medical):   Physical Activity:   . Days of Exercise per Week:   . Minutes of Exercise per Session:   Stress:   . Feeling of Stress :   Social Connections:   . Frequency of Communication with Friends and Family:   . Frequency of Social Gatherings with Friends and Family:   . Attends Religious Services:   . Active Member of Clubs or Organizations:   . Attends Archivist Meetings:   Marland Kitchen Marital Status:   Intimate Partner Violence:   . Fear of Current or Ex-Partner:   . Emotionally Abused:   Marland Kitchen Physically Abused:   . Sexually Abused:     Family History  Problem Relation Age of Onset  . Hypertension Father       Current Facility-Administered Medications:  .  0.9 %  sodium chloride infusion, , Intravenous, PRN, Swayze, Ava, DO, Stopped at 05/27/20 0137 .  0.9 %  sodium chloride infusion, 250 mL, Intravenous, Continuous, Aleskerov, Fuad, MD .  albumin human 25 % solution 12.5 g, 12.5 g, Intravenous, Daily, Aleskerov, Fuad, MD, Last Rate: 60 mL/hr at 05/27/20 1131, 12.5 g at 05/27/20 1131 .  Ampicillin-Sulbactam (UNASYN) 3 g in sodium chloride 0.9 % 100 mL IVPB, 3 g, Intravenous, Q24H, Tsosie Billing, MD, Stopped at 05/26/20 2241 .  feeding supplement (GLUCERNA SHAKE) (GLUCERNA SHAKE) liquid 237 mL, 237 mL, Oral, TID BM, Fritzi Mandes, MD, 237 mL at 05/27/20 1136 .  insulin aspart (novoLOG) injection 0-9 Units, 0-9 Units, Subcutaneous, TID WC & HS, Ottie Glazier, MD, 3 Units at 05/26/20 2159 .  insulin glargine (LANTUS) injection 12 Units, 12 Units, Subcutaneous, QHS, Tu, Ching T, DO, 12 Units at 05/26/20 2159 .  levothyroxine (SYNTHROID) tablet 25 mcg, 25 mcg, Oral, q morning -  10a, Tu, Ching T, DO, 25 mcg at 05/27/20 0515 .  mometasone-formoterol (DULERA) 100-5 MCG/ACT inhaler 2 puff, 2 puff, Inhalation, BID, Tu, Ching T, DO, 2 puff at 05/27/20 1122 .  ondansetron (ZOFRAN) injection 4 mg, 4 mg, Intravenous, Q6H PRN, Tu, Ching T, DO, 4 mg at 05/23/20 1854 .  pantoprazole (PROTONIX) EC tablet 40 mg, 40 mg, Oral, Daily, Fritzi Mandes, MD, 40 mg at 05/27/20 1122 .  traMADol (ULTRAM) tablet 50 mg, 50 mg, Oral, Q6H PRN, Fritzi Mandes, MD  Physical exam:  Vitals:   05/26/20 1943 05/27/20 0449 05/27/20 0751 05/27/20 1200  BP: 124/87 121/62 117/65 (!) 109/59  Pulse: 72 67 65 66  Resp: 16 16 16 17   Temp: 98.5 F (36.9 C) 97.8 F (36.6 C) 97.8 F (36.6 C) 97.6 F (36.4 C)  TempSrc: Oral Oral Oral Oral  SpO2: 97% 96% 95% 98%  Weight:      Height:       Physical Exam Constitutional:      General: He is not in acute distress. Eyes:     Comments: Sclerae icteric  Cardiovascular:     Rate and  Rhythm: Normal rate and regular rhythm.     Heart sounds: Normal heart sounds.  Pulmonary:     Effort: Pulmonary effort is normal.     Breath sounds: Normal breath sounds.  Abdominal:     General: Bowel sounds are normal.     Palpations: Abdomen is soft.  Skin:    General: Skin is warm and dry.  Neurological:     Mental Status: He is alert and oriented to person, place, and time.      CMP Latest Ref Rng & Units 05/27/2020  Glucose 70 - 99 mg/dL 125(H)  BUN 8 - 23 mg/dL 93(H)  Creatinine 0.61 - 1.24 mg/dL 4.92(H)  Sodium 135 - 145 mmol/L 136  Potassium 3.5 - 5.1 mmol/L 3.7  Chloride 98 - 111 mmol/L 97(L)  CO2 22 - 32 mmol/L 27  Calcium 8.9 - 10.3 mg/dL 7.5(L)  Total Protein 6.5 - 8.1 g/dL -  Total Bilirubin 0.3 - 1.2 mg/dL -  Alkaline Phos 38 - 126 U/L -  AST 15 - 41 U/L -  ALT 0 - 44 U/L -   CBC Latest Ref Rng & Units 05/26/2020  WBC 4.0 - 10.5 K/uL 14.8(H)  Hemoglobin 13.0 - 17.0 g/dL 7.6(L)  Hematocrit 39 - 52 % 23.7(L)  Platelets 150 - 400 K/uL 63(L)    @IMAGES @  CT ABDOMEN PELVIS WO CONTRAST  Result Date: 05/19/2020 CLINICAL DATA:  Abdominal pain with nausea in the setting of pancreatic neuroendocrine tumor EXAM: CT ABDOMEN AND PELVIS WITHOUT CONTRAST TECHNIQUE: Multidetector CT imaging of the abdomen and pelvis was performed following the standard protocol without IV contrast. COMPARISON:  February 08, 2020 FINDINGS: Lower chest: 4 mm LEFT lower lobe pulmonary nodule along the pleural surface not present on March of 2021. No consolidation. No pleural effusion. Hepatobiliary: Post placement of biliary stent. Despite presence of stent the common bile duct is more dilated than on the previous exam measuring up to 19 mm. Previously approximately 13 mm. Masslike areas in the pancreatic head are similar. There is been interval development of low attenuation in the pancreatic neck that was not present previously approximately 15 mm (image 28, series 2) Also further dilation of the  distal pancreatic duct is suggested with increase in the soft tissue density in the mid body of the pancreas. This area measuring approximately 3.9  cm greatest thickness at the site of ductal transition where as on the prior study it measured approximately 3.5 cm. Extending from the upper margin of the dilated duct at the site of ductal transition is a low-attenuation ovoid area measuring water density at 5 by 3.1 cm, abutting the lesser curvature. (Image 19, series 2) previously measuring approximately 3.9 x 3.0 cm in this location peripancreatic stranding is mildly increased when compared to the prior study. Soft tissue lesion associated with LEFT adrenal contiguous with with increase in size (image 21, series 2) 2.6 x 2.7 cm, previously 2.5 x 2.4 cm. Pancreas: As above Spleen: Spleen grossly normal on noncontrast imaging. Adrenals/Urinary Tract: Adrenal gland on the RIGHT is normal. LEFT adrenal mass contiguous with the pancreas enlarging as described. Mild perinephric stranding and in general anterior pararenal stranding on the LEFT. No hydronephrosis. Urinary bladder is unremarkable. Stomach/Bowel: Stomach with mild to moderate distension. Mild dilation of small bowel loops becomes moderately dilated in the mid small bowel at the level of the jejunum. There is peristaltic activity versus is transition in the distal jejunum at 3.6 cm caliber proximal to the site of narrowing with decompression of more distal small bowel loops. Colon is largely stool filled. Stool like material is present in the terminal ileum. Vascular/Lymphatic: Calcified atheromatous plaque of the abdominal aorta. No aneurysmal dilation. Adenopathy in the mesentery, largest measuring 11 mm is unchanged since the previous exam. Calcified mesenteric soft tissue (image 44, series 2) approximately 2.7 x 1.8 cm enlarged in short axis though difficult to measure given potential changes in mesenteric position over time. Reproductive: No adenopathy in  the pelvis. Other: No free air.  No ascites. Musculoskeletal: No acute musculoskeletal process. Spinal degenerative changes. IMPRESSION: 1. 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 2. Worsening of biliary duct distension above the level of the stent since previous imaging from March of 2021 3. Increasing distension of small bowel in the proximal small bowel with site of transition in the distal jejunum. Is unclear whether this represents vigorous peristaltic activity following contrast administration or developing small bowel obstruction or ileus. Subsequent follow-up based on symptoms with radiographs may be helpful. 4. Increasing soft tissue and increasing ductal dilation with at least 2 sites of transition in this patient with known neuroendocrine tumor suspicious for worsening tumor with enlarging adrenal involvement on the LEFT as discussed, at the site of previous more benign appearing LEFT adrenal neoplasm now inseparable from stranding and soft tissue in the region of the pancreatic tail. 5. Given appearance it is possible that direct extension of stranding from the pancreas towards the LEFT adrenal may relate to inflammation. Attention on follow-up. 6. Extensive portosystemic collaterals in the upper abdomen compatible with splenic venous compromise, not well assessed but this secondary finding is strongly suggestive of splenic venous compromise due to worsening tumor in the region of the pancreas. Electronically Signed   By: Zetta Bills M.D.   On: 05/19/2020 12:00   DG Chest Port 1 View  Result Date: 05/21/2020 CLINICAL DATA:  Central line placement. EXAM: PORTABLE CHEST 1 VIEW COMPARISON:  Earlier this day. FINDINGS: Tip of the left internal jugular central venous catheter projects over the mid SVC. No pneumothorax. Endotracheal tube tip remains at the thoracic inlet. Normal heart size with unchanged  mediastinal contours. Vascular congestion with chronic interstitial coarsening. Slight increase in left lung base atelectasis. No large pleural effusion. IMPRESSION: 1. Tip  of the left internal jugular central venous catheter projects over the mid SVC. No pneumothorax. 2. Slight increase in left lung base atelectasis. Otherwise unchanged appearance of the chest. Electronically Signed   By: Keith Rake M.D.   On: 05/21/2020 20:53   DG Chest Port 1 View  Result Date: 05/21/2020 CLINICAL DATA:  Atrial fibrillation, pancreatitis, intubation EXAM: PORTABLE CHEST 1 VIEW COMPARISON:  Radiograph 02/08/2020 FINDINGS: Endotracheal tube 4.5 cm from the carina. Telemetry leads overlie the chest. Chronically coarsened interstitial opacities are present throughout the lungs, similar to the comparison. Hypoventilatory changes likely increased prominence of these findings. Mild central vascular congestion without convincing features of edema. No pneumothorax or effusion. The aorta is calcified. The remaining cardiomediastinal contours are unremarkable. No acute osseous or soft tissue abnormality. Degenerative changes are present in the imaged spine and shoulders. IMPRESSION: 1. Endotracheal tube 4.5 cm from the carina. 2. Chronically coarsened interstitial opacities, similar to the comparison. 3. Mild central vascular congestion without convincing features of edema. 4. Atelectasis. Electronically Signed   By: Lovena Le M.D.   On: 05/21/2020 19:09   DG C-Arm 1-60 Min-No Report  Result Date: 05/21/2020 Fluoroscopy was utilized by the requesting physician.  No radiographic interpretation.   ECHOCARDIOGRAM COMPLETE  Result Date: 05/22/2020    ECHOCARDIOGRAM REPORT   Patient Name:   GLENNON KOPKO [facility] Date of Exam: 05/22/2020 Medical Rec #:  [id number]             Height:       67.0 in Accession #:    [id number]            Weight:       140.9 lb Date of Birth:  Jul 24, 1946             BSA:          1.742 m Patient Age:     57 years              BP:           97/68 mmHg Patient Gender: M                     HR:           62 bpm. Exam Location:  [facility] Procedure: 2D Echo, Cardiac Doppler and Color Doppler Indications:     Shock  History:         Patient has prior history of Echocardiogram examinations, most                  recent 09/23/2016. COPD, Arrythmias:Atrial Flutter; Risk                  Factors:Hypertension, Diabetes and Dyslipidemia.  Sonographer:     Sherrie Sport RDCS (AE) Referring Phys:  [id number] Bradly Bienenstock Diagnosing Phys: Serafina Royals MD  Sonographer Comments: No parasternal window, no apical window and echo performed with patient supine and on artificial respirator. The subcostal was the only view obtainable. and Image acquisition challenging due to COPD. IMPRESSIONS  1. Left ventricular ejection fraction, by estimation, is 35 to 40%. The left ventricle has moderately decreased function. The left ventricle demonstrates regional wall motion abnormalities (see scoring diagram/findings for description). Left ventricular  diastolic parameters were normal.  2. Right ventricular systolic function is moderately reduced. The right ventricular size is moderately enlarged. Mildly increased right ventricular wall thickness. There is severely elevated pulmonary artery systolic pressure.  3. Left atrial size was mild to  moderately dilated.  4. Right atrial size was mild to moderately dilated.  5. The mitral valve is normal in structure. Mild to moderate mitral valve regurgitation.  6. Tricuspid valve regurgitation is moderate.  7. The aortic valve is normal in structure. Aortic valve regurgitation is trivial. FINDINGS  Left Ventricle: Left ventricular ejection fraction, by estimation, is 35 to 40%. The left ventricle has moderately decreased function. The left ventricle demonstrates regional wall motion abnormalities. Moderate hypokinesis of the left ventricular, entire inferoseptal wall. The left ventricular internal cavity size  was normal in size. There is no left ventricular hypertrophy. Left ventricular diastolic parameters were normal. Right Ventricle: The right ventricular size is moderately enlarged. Mildly increased right ventricular wall thickness. Right ventricular systolic function is moderately reduced. There is severely elevated pulmonary artery systolic pressure. The tricuspid  regurgitant velocity is 3.80 m/s, and with an assumed right atrial pressure of 10 mmHg, the estimated right ventricular systolic pressure is 20.9 mmHg. Left Atrium: Left atrial size was mild to moderately dilated. Right Atrium: Right atrial size was mild to moderately dilated. Pericardium: There is no evidence of pericardial effusion. Mitral Valve: The mitral valve is normal in structure. Mild to moderate mitral valve regurgitation. Tricuspid Valve: The tricuspid valve is normal in structure. Tricuspid valve regurgitation is moderate. Aortic Valve: The aortic valve is normal in structure. Aortic valve regurgitation is trivial. Pulmonic Valve: The pulmonic valve was normal in structure. Pulmonic valve regurgitation is mild. Aorta: The aortic root and ascending aorta are structurally normal, with no evidence of dilitation. IAS/Shunts: No atrial level shunt detected by color flow Doppler.  LEFT VENTRICLE PLAX 2D LVIDd:         4.23 cm LVIDs:         3.60 cm LV PW:         1.36 cm LV IVS:        0.91 cm  LEFT ATRIUM         Index LA diam:    5.10 cm 2.93 cm/m  PULMONIC VALVE PV Vmax:        0.83 m/s PV Peak grad:   2.7 mmHg RVOT Peak grad: 2 mmHg  TRICUSPID VALVE TR Peak grad:   57.8 mmHg TR Vmax:        380.00 cm/s Serafina Royals MD Electronically signed by Serafina Royals MD Signature Date/Time: 05/22/2020/4:43:08 PM    Final      Assessment and plan- Patient is a 74 y.o. 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  Not much improvement in his kidney functions.  Her total bilirubin has gone down from 9-6.7 today.  Will check CMP again tomorrow.  If liver functions are trending down we can hold off on abdominal imaging.  However if liver functions do not trend down concerning be given the right upper quadrant ultrasound to see if there are any other areas of biliary dilatation.  I spoke to the patient and his next.  Niece is okay with proceeding with imaging studies but does not want any intervention such as PTCA if it comes to it.  Patient and his niece understand that overall patient upon status is declining.  He is not going to go back to his baseline before he came to the hospital given that his kidney functions and  liver functions have worsened now.  However they would like to continue with monthly octreotide shots and see if it would work him to strain his tumor.  He is already received 1 shot to the hospital and I will follow up with him upon discharge to continue the shots as an outpatient.  He is not deemed to be a surgical candidate for his pancreatic mass   2.  Disposition: Patient is currently a DNR.  He is deconditioned from present hospital stay and plan is to send him to SNF  3.  Acute cholangitis: Clinically improved.  He is afebrile, not tachycardic.  Abdominal pain has resolved.  He is currently on antibiotics     Visit Diagnosis 1. Epigastric pain   2. Malignant neoplasm of pancreas, unspecified location of malignancy ([place])   3. Primary pancreatic neuroendocrine tumor   4. Abnormal CXR   5. Encounter for central line placement      Dr. Randa Evens, MD, MPH [facility] [phone] 05/27/2020 2:36 PM

## 2020-05-27 NOTE — TOC Progression Note (Signed)
Transition of Care [facility]) - Progression Note    Patient Details  Name: [name] MRN: [id number] Date of Birth: [birth year]  Transition of Care [facility]) CM/SW Contact  Beverly Sessions, RN Phone Number: 05/27/2020, 4:47 PM  Clinical Narrative:    PT has recommended SNF Patient and niece in agreement PASRR obtained, fl2 sent for signature, bed search initiated    Expected Discharge Plan: Skilled Nursing Facility Barriers to Discharge: Continued Medical Work up  Expected Discharge Plan and Services Expected Discharge Plan: [place]   Discharge Planning Services: CM Consult Post Acute Care Choice: Home Health, Resumption of Svcs/PTA Provider Living arrangements for the past 2 months: Mobile Home                           HH Arranged: RN, PT [facility] Agency: [place] Date [place]: 05/20/20 Time [place]: 1222 Representative spoke with at [place]: [place] ([place]) Interventions    Readmission Risk Interventions No flowsheet data found.

## 2020-05-27 NOTE — Evaluation (Signed)
Physical Therapy Evaluation Patient Details Name: [name] MRN: [id number] DOB: Oct 31, 1946 Today's Date: 05/27/2020   History of Present Illness  74 year old male with a past medical history significant for atrial flutter s/p DCCV x 3 and ablation in 2017/2018, anticoagulated with Eliquis, coronary artery disease detected by chest CT in 2020, HFpEF, oxygen dependent COPD, hypothyroidism, type 2 diabetes, hypertension, and recently diagnosed pancreatic cancer who presented to the ED on 05/18/20 for epigastric pain with associated nausea and vomiting. Abdominal CT revealed pancreatitis with a presumed pseudocyst with worsening of the biliary ductal dilatation  Clinical Impression  Pt very eager to work with PT, initiated exam earlier this AM but pt had a BM and needed cleaned up (of note, small BM during mobility during actual eval, nursing in room post session and assisting with clean up).  Again pt very motivated and showed good effort but was very weak and limited with all aspects of functional mobility and struggled to get hips off bed even minimally despite very heavy assistance, a lot of seuquencing/strategy/positioning cuing and  Encouragement.  Pt reports he knew he was weak, but did not realize how weak he actually was.  Pt concurs that he would not at all be able to safely return home and is open to the idea of rehab.    Follow Up Recommendations SNF    Equipment Recommendations   (TBD at rehab)    Recommendations for Other Services       Precautions / Restrictions Precautions Precautions: Fall Restrictions Weight Bearing Restrictions: No      Mobility  Bed Mobility Overal bed mobility: Needs Assistance Bed Mobility: Sidelying to Sit;Supine to Sit   Sidelying to sit: Max assist Supine to sit: Max assist     General bed mobility comments: Pt showed great effort, but ultimately needed heavy assist to get to and initially maintain sitting balacne.  Assist with  getting squared to EOB.  Transfers Overall transfer level: Needs assistance Equipment used: Rolling walker (2 wheeled) Transfers: Sit to/from Stand Sit to Stand: From elevated surface;Total assist         General transfer comment: multiple attempts at sit to stand with gradually increasing surface height and extra cuing for set up and appropriate sequencing/UE use.  Pt ultimately unable to attain full upright even with very heavy assist.    Ambulation/Gait             General Gait Details: unable/unsafe  Stairs            Wheelchair Mobility    Modified Rankin (Stroke Patients Only)       Balance Overall balance assessment: Needs assistance   Sitting balance-Leahy Scale: Fair Sitting balance - Comments: once assisted to EOB he was able to maintain sitting w/o direct assist                                     Pertinent Vitals/Pain Pain Assessment: No/denies pain    Home Living Family/patient expects to be discharged to:: Skilled nursing facility Living Arrangements:  (lives at 74 y/o friend's home, likely can't go back)                    Prior Function Level of Independence: Needs assistance         Comments: Reports he uses walker in the home, does not get out of the home, reports he  has been getting weaker and weaker recently     Hand Dominance        Extremity/Trunk Assessment   Upper Extremity Assessment Upper Extremity Assessment: Generalized weakness    Lower Extremity Assessment Lower Extremity Assessment: Generalized weakness       Communication   Communication: No difficulties  Cognition Arousal/Alertness: Awake/alert Behavior During Therapy: WFL for tasks assessed/performed Overall Cognitive Status: Within Functional Limits for tasks assessed                                        General Comments      Exercises     Assessment/Plan    PT Assessment Patient needs continued PT  services  PT Problem List Decreased strength;Decreased range of motion;Decreased activity tolerance;Decreased balance;Decreased mobility;Decreased coordination;Decreased cognition;Decreased knowledge of use of DME;Decreased safety awareness;Decreased knowledge of precautions;Pain       PT Treatment Interventions DME instruction;Gait training;Functional mobility training;Therapeutic activities;Therapeutic exercise;Balance training;Cognitive remediation;Patient/family education    PT Goals (Current goals can be found in the Care Plan section)  Acute Rehab PT Goals Patient Stated Goal: Get stronger PT Goal Formulation: With patient Time For Goal Achievement: 06/10/20 Potential to Achieve Goals: Fair    Frequency Min 2X/week   Barriers to discharge        Co-evaluation               AM-PAC PT "6 Clicks" Mobility  Outcome Measure Help needed turning from your back to your side while in a flat bed without using bedrails?: A Lot Help needed moving from lying on your back to sitting on the side of a flat bed without using bedrails?: Total Help needed moving to and from a bed to a chair (including a wheelchair)?: Total Help needed standing up from a chair using your arms (e.g., wheelchair or bedside chair)?: Total Help needed to walk in hospital room?: Total Help needed climbing 3-5 steps with a railing? : Total 6 Click Score: 7    End of Session Equipment Utilized During Treatment: Gait belt Activity Tolerance: Patient limited by fatigue;Patient tolerated treatment well Patient left: in bed;with call bell/phone within reach;with family/visitor present Nurse Communication: Mobility status PT Visit Diagnosis: Difficulty in walking, not elsewhere classified (R26.2);Muscle weakness (generalized) (M62.81)    Time: 1937-9024 PT Time Calculation (min) (ACUTE ONLY): 20 min   Charges:   PT Evaluation $PT Eval Low Complexity: 1 Low          Kreg Shropshire, DPT 05/27/2020, 1:27  PM

## 2020-05-28 LAB — MAGNESIUM: Magnesium: 1.9 mg/dL (ref 1.7–2.4)

## 2020-05-28 LAB — COMPREHENSIVE METABOLIC PANEL
ALT: 115 U/L — ABNORMAL HIGH (ref 0–44)
AST: 67 U/L — ABNORMAL HIGH (ref 15–41)
Albumin: 2.1 g/dL — ABNORMAL LOW (ref 3.5–5.0)
Alkaline Phosphatase: 519 U/L — ABNORMAL HIGH (ref 38–126)
Anion gap: 11 (ref 5–15)
BUN: 99 mg/dL — ABNORMAL HIGH (ref 8–23)
CO2: 27 mmol/L (ref 22–32)
Calcium: 7.6 mg/dL — ABNORMAL LOW (ref 8.9–10.3)
Chloride: 100 mmol/L (ref 98–111)
Creatinine, Ser: 5.02 mg/dL — ABNORMAL HIGH (ref 0.61–1.24)
GFR calc Af Amer: 12 mL/min — ABNORMAL LOW (ref >60)
GFR calc non Af Amer: 10 mL/min — ABNORMAL LOW (ref >60)
Glucose, Bld: 130 mg/dL — ABNORMAL HIGH (ref 70–99)
Potassium: 3.8 mmol/L (ref 3.5–5.1)
Sodium: 138 mmol/L (ref 135–145)
Total Bilirubin: 6.2 mg/dL — ABNORMAL HIGH (ref 0.3–1.2)
Total Protein: 4.4 g/dL — ABNORMAL LOW (ref 6.5–8.1)

## 2020-05-28 LAB — RENAL FUNCTION PANEL
Albumin: 2.2 g/dL — ABNORMAL LOW (ref 3.5–5.0)
Anion gap: 12 (ref 5–15)
BUN: 100 mg/dL — ABNORMAL HIGH (ref 8–23)
CO2: 27 mmol/L (ref 22–32)
Calcium: 7.6 mg/dL — ABNORMAL LOW (ref 8.9–10.3)
Chloride: 100 mmol/L (ref 98–111)
Creatinine, Ser: 5.05 mg/dL — ABNORMAL HIGH (ref 0.61–1.24)
GFR calc Af Amer: 12 mL/min — ABNORMAL LOW (ref >60)
GFR calc non Af Amer: 10 mL/min — ABNORMAL LOW (ref >60)
Glucose, Bld: 127 mg/dL — ABNORMAL HIGH (ref 70–99)
Phosphorus: 7.3 mg/dL — ABNORMAL HIGH (ref 2.5–4.6)
Potassium: 3.8 mmol/L (ref 3.5–5.1)
Sodium: 139 mmol/L (ref 135–145)

## 2020-05-28 LAB — CBC
HCT: 23.7 % — ABNORMAL LOW (ref 39.0–52.0)
Hemoglobin: 8 g/dL — ABNORMAL LOW (ref 13.0–17.0)
MCH: 30 pg (ref 26.0–34.0)
MCHC: 33.8 g/dL (ref 30.0–36.0)
MCV: 88.8 fL (ref 80.0–100.0)
Platelets: 77 10*3/uL — ABNORMAL LOW (ref 150–400)
RBC: 2.67 MIL/uL — ABNORMAL LOW (ref 4.22–5.81)
RDW: 14.3 % (ref 11.5–15.5)
WBC: 15.7 10*3/uL — ABNORMAL HIGH (ref 4.0–10.5)
nRBC: 0.1 % (ref 0.0–0.2)

## 2020-05-28 LAB — GLUCOSE, CAPILLARY
Glucose-Capillary: 104 mg/dL — ABNORMAL HIGH (ref 70–99)
Glucose-Capillary: 140 mg/dL — ABNORMAL HIGH (ref 70–99)
Glucose-Capillary: 181 mg/dL — ABNORMAL HIGH (ref 70–99)

## 2020-05-28 MED ORDER — AMOXICILLIN-POT CLAVULANATE 500-125 MG PO TABS
500.0000 mg | ORAL_TABLET | Freq: Two times a day (BID) | ORAL | Status: DC
  Administered 2020-05-28 – 2020-05-29 (×3): 500 mg via ORAL
  Filled 2020-05-28 (×4): qty 1

## 2020-05-28 MED ORDER — POTASSIUM CHLORIDE CRYS ER 20 MEQ PO TBCR
20.0000 meq | EXTENDED_RELEASE_TABLET | Freq: Once | ORAL | Status: AC
  Administered 2020-05-28: 20 meq via ORAL
  Filled 2020-05-28: qty 1

## 2020-05-28 NOTE — Consult Note (Signed)
PHARMACY CONSULT NOTE  Pharmacy Consult for Electrolyte Monitoring and Replacement   Recent Labs: Potassium (mmol/L)  Date Value  05/28/2020 3.8  05/28/2020 3.8  11/01/2014 3.8   Magnesium (mg/dL)  Date Value  05/28/2020 1.9  10/30/2014 1.8   Calcium (mg/dL)  Date Value  05/28/2020 7.6 (L)  05/28/2020 7.6 (L)   Calcium, Total (mg/dL)  Date Value  11/01/2014 9.4   Albumin (g/dL)  Date Value  05/28/2020 2.2 (L)  05/28/2020 2.1 (L)  10/29/2014 3.0 (L)   Phosphorus (mg/dL)  Date Value  05/28/2020 7.3 (H)   Sodium (mmol/L)  Date Value  05/28/2020 139  05/28/2020 138  10/11/2017 140  11/01/2014 138   Corrected Ca: 9.04 mg/dL  Assessment: 74 y.o. male with a history of CKD, COPD, atrial flutter s/p DCCV x 3 and ablation in 2017/2018, anticoagulated with Eliquis, CAD, diabetes, pancreatic cancer, recently diagnosed who presents with epigastric abdominal pain now with E coli and Klebsiella bacteremia.   Goal of Therapy:  Potassium 4.0 - 5.1 mmol/L Magnesium 2.0 - 2.4 mg/dL All Other Electrolytes WNL  Plan:   KCl 20 meQ po x 1  Magnesium borderline: f/u in am  F/U electrolytes in am 7/15  Dallie Piles ,PharmD Clinical Pharmacist 05/28/2020 7:12 AM

## 2020-05-28 NOTE — Progress Notes (Signed)
[name] at [place] NAME: [name]    MR#:  [id number]  DATE OF BIRTH:  [birth year]  SUBJECTIVE:  sitting up in the bed watching TV No family in the room today patient denies any symptoms. Tells me he worked with physical therapy and eager to go to rehab  REVIEW OF SYSTEMS:   Review of Systems  Constitutional: Negative for chills, fever and weight loss.  HENT: Negative for ear discharge, ear pain and nosebleeds.   Eyes: Negative for blurred vision, pain and discharge.  Respiratory: Negative for sputum production, shortness of breath, wheezing and stridor.   Cardiovascular: Negative for chest pain, palpitations, orthopnea and PND.  Gastrointestinal: Negative for abdominal pain, diarrhea, nausea and vomiting.  Genitourinary: Negative for frequency and urgency.  Musculoskeletal: Negative for back pain and joint pain.  Neurological: Negative for sensory change, speech change, focal weakness and weakness.  Psychiatric/Behavioral: Negative for depression and hallucinations. The patient is not nervous/anxious.    Tolerating Diet:yes Tolerating PT: rehab  DRUG ALLERGIES:   Allergies  Allergen Reactions  . Levaquin [Levofloxacin In D5w] Swelling and Rash    VITALS:  Blood pressure 112/69, pulse 68, temperature 97.7 F (36.5 C), temperature source Oral, resp. rate 16, height 5\' 7"  (1.702 m), weight 63.9 kg, SpO2 98 %.  PHYSICAL EXAMINATION:   Physical Exam  GENERAL:  74 y.o.-year-old patient lying in the bed with no acute distress.  EYES: Pupils equal, round, reactive to light and accommodation. ++ scleral icterus.   HEENT: Head atraumatic, normocephalic. Oropharynx and nasopharynx clear.  NECK:  Supple, no jugular venous distention. No thyroid enlargement, no tenderness.  LUNGS: Normal breath sounds bilaterally, no wheezing, rales, rhonchi. No use of accessory muscles of respiration.  CARDIOVASCULAR: S1, S2 normal. No murmurs,  rubs, or gallops.  ABDOMEN: Soft, nontender, nondistended. Bowel sounds present. No organomegaly or mass. EXTREMITIES: No cyanosis, clubbing or edema b/l.    NEUROLOGIC: Cranial nerves II through XII are intact. No focal Motor or sensory deficits b/l.   PSYCHIATRIC:  patient is alert and oriented x 3.  SKIN: jaundice +  LABORATORY PANEL:  CBC Recent Labs  Lab 05/28/20 0517  WBC 15.7*  HGB 8.0*  HCT 23.7*  PLT 77*    Chemistries  Recent Labs  Lab 05/28/20 0517  NA 138  139  K 3.8  3.8  CL 100  100  CO2 27  27  GLUCOSE 130*  127*  BUN 99*  100*  CREATININE 5.02*  5.05*  CALCIUM 7.6*  7.6*  MG 1.9  AST 67*  ALT 115*  ALKPHOS 519*  BILITOT 6.2*   Cardiac Enzymes No results for input(s): TROPONINI in the last 168 hours. RADIOLOGY:  No results found. ASSESSMENT AND PLAN:   [name] is a 74 year old male with a past medical history significant for atrial flutter s/p DCCV x 3 and ablation in 2017/2018, anticoagulated with Eliquis, coronary artery disease detected by chest CT in 2020, HFpEF, oxygen dependent COPD, hypothyroidism, type 2 diabetes, hypertension, and recently diagnosed pancreatic cancer who presented to the ED on 05/18/20 for epigastric pain with associated nausea and vomiting.Abdominal CT revealed pancreatitis with a presumed pseudocyst with worsening of the biliary ductal dilatation. Blood cultures were positive for polymicrobial bacteremia. Blood cultures having both Klebsiella and E. Coli  E. coli/Klebsiella sepsis secondary to cholangitis with obstructive jaundice in the setting of pancreatic neuroendocrine tumor -patient was transferred out of ICU. Came in with  septic shock-- now resolved. -discontinue IV Solucortef 7/13 -IV unasyn per Dr. Darlyn Read to oral Augmentin (total 14 days) -no fever -white count down to 15K (some could be due to steroids)  Acute on chronic renal failure stage IV -patient had required dialysis during his  previous admission at [place] good urine -removed Foley on 7/13 -has External/condom cath -appreciate Dr. Elwyn Lade input -avoid nephrotoxins  Severe protein calorie malnutrition -follow dietitian recommendation  Biliary obstruction with jaundice secondary to pancreatic neuroendocrine tumor -patient is not a candidate for any surgery -underwent ERCP with biliary stent placement-- direct bilirubin continues to be trending down --9.6--6.2 today --follows by oncology Dr. Janese Banks-- Octreotide shot x 1. Patient will follow-up with Dr. Janese Banks as outpatient for further [facility] rx--this was d/w [name] --patient overall has a poor prognosis --per ICU attending note-- discussion was made with patient's nieces [name] and [name] and they chose no aggressive measures. Treat the treatable -patient is DNR -Per GI Dr Anna--direct bilirubin trending down hold off on further imaging at present.  DVT prophylaxis SCD  Hypothyroidism continue Synthroid  Type II diabetes with renal disease continue sliding scale and Lantus  Foley and CL --removed on 05/27/2020 TOC for d/c planning to rehab--niece and pt agrees 7/14>> pt has bed offer for [facility] per TOC  Procedures:ERCP Family communication :Niece and POA [name] Consults : ID, oncology, G.I. CODE STATUS: DNR DVT Prophylaxis : SCD  Status is: Inpatient  Remains inpatient appropriate because:waiting for Rehab placement   Dispo: The patient is from: Home              Anticipated d/c is to: TBD              Anticipated d/c date [facility] bed is available              Patient currently is best at baseline at present TDiscussed with patient and niece agreement with plan. Patient will follow-up with Dr. Janese Banks at the cancer center.     TOTAL TIME TAKING CARE OF THIS PATIENT: 15 minutes.  >50% time spent on counselling and coordination of care  Note: This dictation was prepared with Dragon dictation along with smaller phrase technology. Any  transcriptional errors that result from this process are unintentional.  Fritzi Mandes M.D    [facility]   CC: Primary care physician; [name], MDPatient ID: [name], male   DOB: Aug 30, 1946, 74 y.o.   MRN: [id number]

## 2020-05-28 NOTE — Plan of Care (Signed)
Patient desires to get up and walk but is very weak. [name]

## 2020-05-28 NOTE — TOC Progression Note (Signed)
Transition of Care [facility]) - Progression Note    Patient Details  Name: [name] MRN: [id number] Date of Birth: 1946/10/17  Transition of Care [facility]) CM/SW Contact  Beverly Sessions, RN Phone Number: 05/28/2020, 5:20 PM  Clinical Narrative:    Bed offers presented to niece [name], and Ms. Johnson.   They have selected [facility].  Accepted in the [place], and notified Chris at [facility].  Insurance auth submitted through the [employer]  Per Gerald Stabs since patient is fully vaccinated he will not require a repeat covid test.    Expected Discharge Plan: Skilled Nursing Facility Barriers to Discharge: Continued Medical Work up  Expected Discharge Plan and Services Expected Discharge Plan: [place]   Discharge Planning Services: CM Consult Post Acute Care Choice: Home Health, Resumption of Svcs/PTA Provider Living arrangements for the past 2 months: Mobile Home                           HH Arranged: RN, PT [facility] Agency: [place] Date [place]: 05/20/20 Time [place]: 1222 Representative spoke with at [place]: [place] ([place]) Interventions    Readmission Risk Interventions No flowsheet data found.

## 2020-05-28 NOTE — Progress Notes (Signed)
GI note  Noted bilirubin is going down.  I will sign off.  Please call me if any further GI concerns or questions.  We would like to thank you for the opportunity to participate in the care of [name].   Dr [name] MD,MRCP [facility]) Gastroenterology/Hepatology Pager: [phone]

## 2020-05-28 NOTE — Progress Notes (Signed)
[address] [place], [place] [address] Phone [phone]. Fax [phone]  Date: 05/28/2020                  Patient Name:  [name]  MRN: [id number]  DOB: 1946/02/02  Age / Sex: 74 y.o., male         PCP: [name], MD                 Service Requesting Consult: IM/ Fritzi Mandes, MD                 Reason for Consult: ARF            History of Present Illness: Patient is a 74 y.o. male with medical problems of Pancreatic neuroendocrine tumor, chronic pancreatitis, atrial flutter status post ablation, requiring anticoagulation with Eliquis, hypertension, coronary disease, COPD, insulin-dependent type 2 diabetes, CKD stage IIIb, who was admitted to [facility] on 05/18/2020 for evaluation of Epigastric pain [R10.13] Abdominal pain [R10.9] Malignant neoplasm of pancreas, unspecified location of malignancy ([place]) [C25.9]  Patient has baseline chronic diffuse abdominal pain from his pancreatic cancer but came in for acute worsening with nausea and vomiting. Nephrology consult has been requested for evaluation of acute renal failure Patient is currently intubated and on multiple pressors.  He is not able to provide any meaningful information.  All information is obtained from the chart and primary team.  05/28/2020 update: Foley catheter had to be replaced.  Urine output currently 1.4 L.  Creatinine up to 5.05.  07/13 0701 - 07/14 0700 In: 862.8 [P.O.:720; I.V.:3.8; IV Piggyback:139] Out: 3151 [Urine:1485]   Current medications: Current Facility-Administered Medications  Medication Dose Route Frequency Provider Last Rate Last Admin  . 0.9 %  sodium chloride infusion   Intravenous PRN Swayze, Ava, DO   Stopped at 05/27/20 2305  . 0.9 %  sodium chloride infusion  250 mL Intravenous Continuous Ottie Glazier, MD      . amoxicillin-clavulanate (AUGMENTIN) 500-125 MG per tablet 500 mg  500 mg Oral BID Dallie Piles, RPH   500 mg at 05/28/20 1246  . feeding  supplement (GLUCERNA SHAKE) (GLUCERNA SHAKE) liquid 237 mL  237 mL Oral TID BM Fritzi Mandes, MD   237 mL at 05/28/20 1427  . insulin aspart (novoLOG) injection 0-9 Units  0-9 Units Subcutaneous TID WC & HS Ottie Glazier, MD   1 Units at 05/28/20 1246  . insulin glargine (LANTUS) injection 12 Units  12 Units Subcutaneous QHS Tu, Ching T, DO   12 Units at 05/27/20 2217  . levothyroxine (SYNTHROID) tablet 25 mcg  25 mcg Oral q morning - 10a Tu, Ching T, DO   25 mcg at 05/28/20 0610  . mometasone-formoterol (DULERA) 100-5 MCG/ACT inhaler 2 puff  2 puff Inhalation BID Tu, Ching T, DO   2 puff at 05/28/20 1024  . ondansetron (ZOFRAN) injection 4 mg  4 mg Intravenous Q6H PRN Tu, Ching T, DO   4 mg at 05/23/20 1854  . pantoprazole (PROTONIX) EC tablet 40 mg  40 mg Oral Daily Fritzi Mandes, MD   40 mg at 05/28/20 1023  . potassium chloride SA (KLOR-CON) CR tablet 20 mEq  20 mEq Oral Once Dallie Piles, RPH      . traMADol Veatrice Bourbon) tablet 50 mg  50 mg Oral Q6H PRN Fritzi Mandes, MD         Vital Signs: Blood pressure 112/69, pulse 68, temperature 97.7 F (36.5 C), temperature source Oral,  resp. rate 16, height 5\' 7"  (1.702 m), weight 63.9 kg, SpO2 98 %.   Intake/Output Summary (Last 24 hours) at 05/28/2020 1431 Last data filed at 05/28/2020 1402 Gross per 24 hour  Intake 862.82 ml  Output 1285 ml  Net -422.18 ml    Weight trends: Filed Weights   05/18/20 1745 05/19/20 0206  Weight: 65.8 kg 63.9 kg    Physical Exam: General:  No acute distress  HEENT  scleral icterus noted, oral mucosa moist  Heart::  Regular, no rubs  Abdomen:  Soft, nondistended, bowel sounds present  Extremities:  1+ dependent edema  Neurologic:  Awake, alert, following commands  Skin:  Jaundice           Lab results: Basic Metabolic Panel: Recent Labs  Lab 05/26/20 0535 05/26/20 0535 05/26/20 1207 05/27/20 0505 05/28/20 0517  NA 132*   < > 132* 136 138  139  K 3.3*   < > 3.4* 3.7 3.8  3.8  CL 93*   < >  94* 97* 100  100  CO2 27   < > 26 27 27  27   GLUCOSE 164*   < > 243* 125* 130*  127*  BUN 94*   < > 92* 93* 99*  100*  CREATININE 4.86*   < > 5.06* 4.92* 5.02*  5.05*  CALCIUM 6.9*   < > 7.2* 7.5* 7.6*  7.6*  MG 2.0  --   --  2.0 1.9  PHOS UNABLE TO REPORT DUE TO ICTERUS  --   --  7.1* 7.3*   < > = values in this interval not displayed.    Liver Function Tests: Recent Labs  Lab 05/28/20 0517  AST 67*  ALT 115*  ALKPHOS 519*  BILITOT 6.2*  PROT 4.4*  ALBUMIN 2.1*  2.2*   Recent Labs  Lab 05/22/20 1036  LIPASE 22   No results for input(s): AMMONIA in the last 168 hours.  CBC: Recent Labs  Lab 05/24/20 0430 05/24/20 0430 05/26/20 1207 05/28/20 0517  WBC 21.4*   < > 14.8* 15.7*  NEUTROABS 17.4*  --  10.3*  --   HGB 8.7*   < > 7.6* 8.0*  HCT 26.4*   < > 23.7* 23.7*  MCV 90.4   < > 92.2 88.8  PLT 65*   < > 63* 77*   < > = values in this interval not displayed.    Cardiac Enzymes: No results for input(s): CKTOTAL, TROPONINI in the last 168 hours.  BNP: Invalid input(s): POCBNP  CBG: Recent Labs  Lab 05/27/20 1201 05/27/20 1700 05/27/20 2107 05/28/20 0749 05/28/20 1207  GLUCAP 117* 223* 187* 104* 140*    Microbiology: Recent Results (from the past 720 hour(s))  SARS Coronavirus 2 by RT PCR (hospital order, performed in [facility] hospital lab) Nasopharyngeal Urine, Clean Catch     Status: None   Collection Time: 05/18/20 11:30 PM   Specimen: Urine, Clean Catch; Nasopharyngeal  Result Value Ref Range Status   SARS Coronavirus 2 NEGATIVE NEGATIVE Final    Comment: (NOTE) SARS-CoV-2 target nucleic acids are NOT DETECTED.  The SARS-CoV-2 RNA is generally detectable in upper and lower respiratory specimens during the acute phase of infection. The lowest concentration of SARS-CoV-2 viral copies this assay can detect is 250 copies / mL. A negative result does not preclude SARS-CoV-2 infection and should not be used as the sole basis for treatment  or other patient management decisions.  A negative result may occur  with improper specimen collection / handling, submission of specimen other than nasopharyngeal swab, presence of viral mutation(s) within the areas targeted by this assay, and inadequate number of viral copies (<250 copies / mL). A negative result must be combined with clinical observations, patient history, and epidemiological information.  Fact Sheet for Patients:   StrictlyIdeas.no  Fact Sheet for Healthcare Providers: BankingDealers.co.za  This test is not yet approved or  cleared by the [place] FDA and has been authorized for detection and/or diagnosis of SARS-CoV-2 by FDA under an Emergency Use Authorization (EUA).  This EUA will remain in effect (meaning this test can be used) for the duration of the COVID-19 declaration under Section 564(b)(1) of the Act, 21 U.S.C. section 360bbb-3(b)(1), unless the authorization is terminated or revoked sooner.  Performed at [facility], [address] [place], [place] [address]   CULTURE, BLOOD (ROUTINE X 2) w Reflex to ID Panel     Status: Abnormal   Collection Time: 05/19/20  3:02 PM   Specimen: BLOOD  Result Value Ref Range Status   Specimen Description   Final    BLOOD RIGHT ANTECUBITAL Performed at [facility], [address] [place], [place] [address]    Special Requests   Final    BOTTLES DRAWN AEROBIC AND ANAEROBIC Blood Culture adequate volume Performed at [facility], [address], [place], [place] [address]    Culture  Setup Time   Final    GRAM NEGATIVE RODS IN BOTH AEROBIC AND ANAEROBIC BOTTLES CRITICAL RESULT CALLED TO, READ BACK BY AND VERIFIED WITH: Hart Robinsons [facility] [address] 05/20/20 HNM Performed at [facility], [place] [address] [place], [place] [address]    Culture KLEBSIELLA PNEUMONIAE ESCHERICHIA COLI  (A)  Final   Report Status 05/23/2020 FINAL  Final    Organism ID, Bacteria KLEBSIELLA PNEUMONIAE  Final   Organism ID, Bacteria ESCHERICHIA COLI  Final      Susceptibility   Escherichia coli - MIC*    AMPICILLIN 8 SENSITIVE Sensitive     CEFAZOLIN <=4 SENSITIVE Sensitive     CEFEPIME <=0.12 SENSITIVE Sensitive     CEFTAZIDIME <=1 SENSITIVE Sensitive     CEFTRIAXONE <=0.25 SENSITIVE Sensitive     CIPROFLOXACIN <=0.25 SENSITIVE Sensitive     GENTAMICIN <=1 SENSITIVE Sensitive     IMIPENEM <=0.25 SENSITIVE Sensitive     TRIMETH/SULFA <=20 SENSITIVE Sensitive     AMPICILLIN/SULBACTAM <=2 SENSITIVE Sensitive     PIP/TAZO <=4 SENSITIVE Sensitive     * ESCHERICHIA COLI   Klebsiella pneumoniae - MIC*    AMPICILLIN RESISTANT Resistant     CEFAZOLIN <=4 SENSITIVE Sensitive     CEFEPIME <=0.12 SENSITIVE Sensitive     CEFTAZIDIME <=1 SENSITIVE Sensitive     CEFTRIAXONE <=0.25 SENSITIVE Sensitive     CIPROFLOXACIN <=0.25 SENSITIVE Sensitive     GENTAMICIN <=1 SENSITIVE Sensitive     IMIPENEM <=0.25 SENSITIVE Sensitive     TRIMETH/SULFA <=20 SENSITIVE Sensitive     AMPICILLIN/SULBACTAM 4 SENSITIVE Sensitive     PIP/TAZO <=4 SENSITIVE Sensitive     * KLEBSIELLA PNEUMONIAE  Blood Culture ID Panel (Reflexed)     Status: Abnormal   Collection Time: 05/19/20  3:02 PM  Result Value Ref Range Status   Enterococcus species NOT DETECTED NOT DETECTED Final   Listeria monocytogenes NOT DETECTED NOT DETECTED Final   Staphylococcus species NOT DETECTED NOT DETECTED Final   Staphylococcus aureus (BCID) NOT DETECTED NOT DETECTED Final  Streptococcus species NOT DETECTED NOT DETECTED Final   Streptococcus agalactiae NOT DETECTED NOT DETECTED Final   Streptococcus pneumoniae NOT DETECTED NOT DETECTED Final   Streptococcus pyogenes NOT DETECTED NOT DETECTED Final   Acinetobacter baumannii NOT DETECTED NOT DETECTED Final   Enterobacteriaceae species DETECTED (A) NOT DETECTED Final    Comment: CRITICAL RESULT CALLED TO, READ BACK BY AND VERIFIED WITH:   SCOTT HALL PHARMD 0335 05/20/20 HNM    Enterobacter cloacae complex NOT DETECTED NOT DETECTED Final   Escherichia coli DETECTED (A) NOT DETECTED Final    Comment: CRITICAL RESULT CALLED TO, READ BACK BY AND VERIFIED WITH: SCOTT HALL PHARMD 0335 05/20/20 HNM    Klebsiella oxytoca NOT DETECTED NOT DETECTED Final   Klebsiella pneumoniae DETECTED (A) NOT DETECTED Final    Comment: CRITICAL RESULT CALLED TO, READ BACK BY AND VERIFIED WITH: SCOTT HALL PHARMD 0335 05/20/20 HNM    Proteus species NOT DETECTED NOT DETECTED Final   Serratia marcescens NOT DETECTED NOT DETECTED Final   Carbapenem resistance NOT DETECTED NOT DETECTED Final   Haemophilus influenzae NOT DETECTED NOT DETECTED Final   Neisseria meningitidis NOT DETECTED NOT DETECTED Final   Pseudomonas aeruginosa NOT DETECTED NOT DETECTED Final   Candida albicans NOT DETECTED NOT DETECTED Final   Candida glabrata NOT DETECTED NOT DETECTED Final   Candida krusei NOT DETECTED NOT DETECTED Final   Candida parapsilosis NOT DETECTED NOT DETECTED Final   Candida tropicalis NOT DETECTED NOT DETECTED Final    Comment: Performed at [facility], [address] [place], [place] [address]  CULTURE, BLOOD (ROUTINE X 2) w Reflex to ID Panel     Status: Abnormal   Collection Time: 05/19/20  3:14 PM   Specimen: BLOOD  Result Value Ref Range Status   Specimen Description   Final    BLOOD BLOOD RIGHT HAND Performed at [facility], [address] [place], [place] [address]    Special Requests   Final    BOTTLES DRAWN AEROBIC AND ANAEROBIC Blood Culture adequate volume Performed at [facility], [address] [place], [place] [address]    Culture  Setup Time   Final    GRAM NEGATIVE RODS IN BOTH AEROBIC AND ANAEROBIC BOTTLES CRITICAL VALUE NOTED.  VALUE IS CONSISTENT WITH PREVIOUSLY REPORTED AND CALLED VALUE. Performed at [facility], [address] [place], [place] [address]    Culture KLEBSIELLA  PNEUMONIAE (A)  Final   Report Status 05/22/2020 FINAL  Final   Organism ID, Bacteria KLEBSIELLA PNEUMONIAE  Final      Susceptibility   Klebsiella pneumoniae - MIC*    AMPICILLIN RESISTANT Resistant     CEFAZOLIN <=4 SENSITIVE Sensitive     CEFEPIME <=0.12 SENSITIVE Sensitive     CEFTAZIDIME <=1 SENSITIVE Sensitive     CEFTRIAXONE <=0.25 SENSITIVE Sensitive     CIPROFLOXACIN <=0.25 SENSITIVE Sensitive     GENTAMICIN <=1 SENSITIVE Sensitive     IMIPENEM <=0.25 SENSITIVE Sensitive     TRIMETH/SULFA <=20 SENSITIVE Sensitive     AMPICILLIN/SULBACTAM 4 SENSITIVE Sensitive     PIP/TAZO <=4 SENSITIVE Sensitive     * KLEBSIELLA PNEUMONIAE  MRSA PCR Screening     Status: None   Collection Time: 05/21/20  5:24 PM   Specimen: Nasopharyngeal  Result Value Ref Range Status   MRSA by PCR NEGATIVE NEGATIVE Final    Comment:        The GeneXpert MRSA Assay (FDA approved for NASAL specimens only), is one  component of a comprehensive MRSA colonization surveillance program. It is not intended to diagnose MRSA infection nor to guide or monitor treatment for MRSA infections. Performed at [facility], [address] [place],  [address]      Coagulation Studies: No results for input(s): LABPROT, INR in the last 72 hours.  Urinalysis: No results for input(s): COLORURINE, LABSPEC, PHURINE, GLUCOSEU, HGBUR, BILIRUBINUR, KETONESUR, PROTEINUR, UROBILINOGEN, NITRITE, LEUKOCYTESUR in the last 72 hours.  Invalid input(s): APPERANCEUR      Imaging: No results found.   Assessment & Plan: Pt is a 74 y.o.  medical problems of Pancreatic neuroendocrine tumor, chronic pancreatitis, atrial flutter status post ablation, requiring anticoagulation with Eliquis, hypertension, coronary disease, COPD, insulin-dependent type 2 diabetes, CKD stage IIIb,   male with , was admitted on 05/18/2020 with Epigastric pain [R10.13] Abdominal pain [R10.9] Malignant neoplasm of pancreas, unspecified  location of malignancy ([place]) [C25.9]  #.  Acute kidney injury on chronic kidney disease stage IV Baseline creatinine 2.6/GFR 27  Urinalysis (July 4) proteinuria 100 mg/dL, 0-5 RBCs, 0-5 WBC Imaging: July 5 CT abdomen: Pancreatitis, pancreatic pseudocysts, bile duct distention, left adrenal mass, mild perinephric stranding on the left.  No hydronephrosis  Acute kidney injury is likely secondary to concurrent illness, sepsis leading to ATN  -Suspect there may be some element of urinary retention as well.  Urine output 1.4 L over the preceding 24 hours.  Foley catheter had to be replaced.  Continue to monitor renal parameters daily.  No immediate need for dialysis though renal function is quite diminished.  #Acute respiratory failure Patient continues to have stable respiratory status.  #Sepsis Klebsiella bacteremia from July 5.   Patient now transitioned to Augmentin.   #Pancreatic neuroendocrine tumor with pseudocyst, worsening biliary duct dilatation,  patient underwent ERCP on July 7 where occluded stent and purulent drainage in the common bile duct He underwent pus drainage from the biliary tree, stent placement in the common bile duct 7/7    LOS: 9 Lutricia Widjaja 7/14/20212:31 PM    Note: This note was prepared with Dragon dictation. Any transcription errors are unintentional

## 2020-05-29 ENCOUNTER — Other Ambulatory Visit: Payer: Self-pay | Admitting: *Deleted

## 2020-05-29 DIAGNOSIS — I4892 Unspecified atrial flutter: Secondary | ICD-10-CM | POA: Diagnosis not present

## 2020-05-29 DIAGNOSIS — C250 Malignant neoplasm of head of pancreas: Secondary | ICD-10-CM | POA: Diagnosis not present

## 2020-05-29 DIAGNOSIS — N179 Acute kidney failure, unspecified: Secondary | ICD-10-CM | POA: Diagnosis not present

## 2020-05-29 DIAGNOSIS — J449 Chronic obstructive pulmonary disease, unspecified: Secondary | ICD-10-CM | POA: Diagnosis not present

## 2020-05-29 DIAGNOSIS — K838 Other specified diseases of biliary tract: Secondary | ICD-10-CM | POA: Diagnosis not present

## 2020-05-29 DIAGNOSIS — Z794 Long term (current) use of insulin: Secondary | ICD-10-CM | POA: Diagnosis not present

## 2020-05-29 DIAGNOSIS — C7A019 Malignant carcinoid tumor of the small intestine, unspecified portion: Secondary | ICD-10-CM

## 2020-05-29 DIAGNOSIS — C259 Malignant neoplasm of pancreas, unspecified: Secondary | ICD-10-CM | POA: Diagnosis not present

## 2020-05-29 DIAGNOSIS — E785 Hyperlipidemia, unspecified: Secondary | ICD-10-CM | POA: Diagnosis not present

## 2020-05-29 DIAGNOSIS — K8309 Other cholangitis: Secondary | ICD-10-CM | POA: Diagnosis not present

## 2020-05-29 DIAGNOSIS — I130 Hypertensive heart and chronic kidney disease with heart failure and stage 1 through stage 4 chronic kidney disease, or unspecified chronic kidney disease: Secondary | ICD-10-CM | POA: Diagnosis not present

## 2020-05-29 DIAGNOSIS — K839 Disease of biliary tract, unspecified: Secondary | ICD-10-CM | POA: Diagnosis not present

## 2020-05-29 DIAGNOSIS — D3A098 Benign carcinoid tumors of other sites: Secondary | ICD-10-CM

## 2020-05-29 DIAGNOSIS — E119 Type 2 diabetes mellitus without complications: Secondary | ICD-10-CM | POA: Diagnosis not present

## 2020-05-29 DIAGNOSIS — N189 Chronic kidney disease, unspecified: Secondary | ICD-10-CM | POA: Diagnosis not present

## 2020-05-29 DIAGNOSIS — K8502 Idiopathic acute pancreatitis with infected necrosis: Secondary | ICD-10-CM | POA: Diagnosis not present

## 2020-05-29 DIAGNOSIS — E43 Unspecified severe protein-calorie malnutrition: Secondary | ICD-10-CM | POA: Diagnosis not present

## 2020-05-29 DIAGNOSIS — I10 Essential (primary) hypertension: Secondary | ICD-10-CM | POA: Diagnosis not present

## 2020-05-29 DIAGNOSIS — D3A8 Other benign neuroendocrine tumors: Secondary | ICD-10-CM | POA: Diagnosis not present

## 2020-05-29 DIAGNOSIS — D649 Anemia, unspecified: Secondary | ICD-10-CM | POA: Diagnosis not present

## 2020-05-29 DIAGNOSIS — R109 Unspecified abdominal pain: Secondary | ICD-10-CM | POA: Diagnosis not present

## 2020-05-29 DIAGNOSIS — M6281 Muscle weakness (generalized): Secondary | ICD-10-CM | POA: Diagnosis not present

## 2020-05-29 DIAGNOSIS — R279 Unspecified lack of coordination: Secondary | ICD-10-CM | POA: Diagnosis not present

## 2020-05-29 DIAGNOSIS — I2510 Atherosclerotic heart disease of native coronary artery without angina pectoris: Secondary | ICD-10-CM | POA: Diagnosis not present

## 2020-05-29 DIAGNOSIS — R945 Abnormal results of liver function studies: Secondary | ICD-10-CM | POA: Diagnosis not present

## 2020-05-29 DIAGNOSIS — E039 Hypothyroidism, unspecified: Secondary | ICD-10-CM | POA: Diagnosis not present

## 2020-05-29 DIAGNOSIS — R5381 Other malaise: Secondary | ICD-10-CM | POA: Diagnosis not present

## 2020-05-29 DIAGNOSIS — E569 Vitamin deficiency, unspecified: Secondary | ICD-10-CM | POA: Diagnosis not present

## 2020-05-29 LAB — GLUCOSE, CAPILLARY
Glucose-Capillary: 52 mg/dL — ABNORMAL LOW (ref 70–99)
Glucose-Capillary: 62 mg/dL — ABNORMAL LOW (ref 70–99)
Glucose-Capillary: 98 mg/dL (ref 70–99)
Glucose-Capillary: 165 mg/dL — ABNORMAL HIGH (ref 70–99)

## 2020-05-29 LAB — MAGNESIUM: Magnesium: 2.1 mg/dL (ref 1.7–2.4)

## 2020-05-29 LAB — RENAL FUNCTION PANEL
Albumin: 2 g/dL — ABNORMAL LOW (ref 3.5–5.0)
Anion gap: 9 (ref 5–15)
BUN: 96 mg/dL — ABNORMAL HIGH (ref 8–23)
CO2: 27 mmol/L (ref 22–32)
Calcium: 7.9 mg/dL — ABNORMAL LOW (ref 8.9–10.3)
Chloride: 103 mmol/L (ref 98–111)
Creatinine, Ser: 4.79 mg/dL — ABNORMAL HIGH (ref 0.61–1.24)
GFR calc Af Amer: 13 mL/min — ABNORMAL LOW (ref >60)
GFR calc non Af Amer: 11 mL/min — ABNORMAL LOW (ref >60)
Glucose, Bld: 77 mg/dL (ref 70–99)
Phosphorus: 6.9 mg/dL — ABNORMAL HIGH (ref 2.5–4.6)
Potassium: 4 mmol/L (ref 3.5–5.1)
Sodium: 139 mmol/L (ref 135–145)

## 2020-05-29 MED ORDER — AMOXICILLIN-POT CLAVULANATE 500-125 MG PO TABS: 500.0000 mg | ORAL_TABLET | Freq: Two times a day (BID) | ORAL | 0 refills | Status: AC

## 2020-05-29 MED ORDER — INSULIN GLARGINE 100 UNIT/ML ~~LOC~~ SOLN: 12.0000 [IU] | Freq: Every day | SUBCUTANEOUS | 11 refills | Status: AC

## 2020-05-29 MED ORDER — PANTOPRAZOLE SODIUM 40 MG PO TBEC: 40.0000 mg | DELAYED_RELEASE_TABLET | Freq: Every day | ORAL | 0 refills | Status: DC

## 2020-05-29 MED ORDER — METOPROLOL SUCCINATE ER 25 MG PO TB24
12.5000 mg | ORAL_TABLET | Freq: Every day | ORAL | 11 refills | Status: DC
Start: 2020-05-29 — End: 2020-06-06

## 2020-05-29 MED ORDER — GLUCERNA SHAKE PO LIQD: 237.0000 mL | Freq: Three times a day (TID) | ORAL | 0 refills | Status: DC

## 2020-05-29 MED ORDER — ROSUVASTATIN CALCIUM 10 MG PO TABS: 10.0000 mg | ORAL_TABLET | Freq: Every day | ORAL | 0 refills | Status: DC

## 2020-05-29 NOTE — Progress Notes (Signed)
Hypoglycemic Event  CBG: 62  Treatment: 8 oz juice/soda  Symptoms: None  Follow-up CBG: TMHD:6222 CBG Result:98  Possible Reasons for Event: Inadequate meal intake  Comments/MD notified:    Cloretta Ned

## 2020-05-29 NOTE — Consult Note (Signed)
PHARMACY CONSULT NOTE  Pharmacy Consult for Electrolyte Monitoring and Replacement   Recent Labs: Potassium (mmol/L)  Date Value  05/29/2020 4.0  11/01/2014 3.8   Magnesium (mg/dL)  Date Value  05/29/2020 2.1  10/30/2014 1.8   Calcium (mg/dL)  Date Value  05/29/2020 7.9 (L)   Calcium, Total (mg/dL)  Date Value  11/01/2014 9.4   Albumin (g/dL)  Date Value  05/29/2020 2.0 (L)  10/29/2014 3.0 (L)   Phosphorus (mg/dL)  Date Value  05/29/2020 6.9 (H)   Sodium (mmol/L)  Date Value  05/29/2020 139  10/11/2017 140  11/01/2014 138   Corrected Ca: 9.04 mg/dL  Assessment: 74 y.o. male with a history of CKD, COPD, atrial flutter s/p DCCV x 3 and ablation in 2017/2018, anticoagulated with Eliquis, CAD, diabetes, pancreatic cancer, recently diagnosed who presents with epigastric abdominal pain now with E coli and Klebsiella bacteremia.   Goal of Therapy:  Potassium 4.0 - 5.1 mmol/L Magnesium 2.0 - 2.4 mg/dL All Other Electrolytes WNL  Plan:   No electrolyte replacement warranted  F/U electrolytes in am 7/16  Dallie Piles ,PharmD Clinical Pharmacist 05/29/2020 7:38 AM

## 2020-05-29 NOTE — Progress Notes (Signed)
Hypoglycemic Event  CBG: 52  Treatment: 8 oz juice/soda  Symptoms: None  Follow-up CBG: Time:0820 CBG Result:62  Possible Reasons for Event: Inadequate meal intake  Comments/MD notified: repeat treatment. 8 oz juice, MD notified.    Cloretta Ned

## 2020-05-29 NOTE — Discharge Summary (Addendum)
[place] [facility] [place] NAME: [name]    MR#:  [id number]  DATE OF BIRTH:  12-Mar-1946  DATE OF ADMISSION:  05/18/2020 ADMITTING PHYSICIAN: Ava Swayze, DO  DATE OF DISCHARGE: 05/29/2020  PRIMARY CARE PHYSICIAN: [name], MD    ADMISSION DIAGNOSIS:  Epigastric pain [R10.13] Abdominal pain [R10.9] Malignant neoplasm of pancreas, unspecified location of malignancy ([place]) [C25.9]  DISCHARGE DIAGNOSIS:  E. 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 Aortic atherosclerosis ([place])    As seen on 04/2019 CT  . Atrial flutter ([place])    a. s/p ablation 2017/2018 and DCCV 2015/2016/2019 b. 2017 echo EF 60-65%, no RWMA, mild MR,  . CAD (coronary artery disease)    3v CAD 04/2019 CT  . Carcinoid tumor    metastatic  . CKD (chronic kidney disease), stage III   . COPD (chronic obstructive pulmonary disease) (H[facility])    PM oxygen  . Current occasional smoker    1-2 cigarettes / week  . Diabetes mellitus type 2, insulin dependent ([place])   . Heart failure with preserved ejection fraction ([place])    2017 echo EF 60-65%, no RWMA, mild MR,    . Hyperlipidemia   . Hypertension   . Pancreatitis   . Polycythemia     HOSPITAL COURSE:  [name] is a 74 year old male with a past medical history significant for atrial flutter s/p DCCV x 3 and ablation in 2017/2018, anticoagulated with Eliquis, coronary artery disease detected by chest CT in 2020, HFpEF, oxygen dependent COPD, hypothyroidism, type 2 diabetes, hypertension, and recently diagnosed pancreatic cancer who presented to the ED on 05/18/20 for epigastric pain with associated nausea and vomiting.Abdominal CT  revealed pancreatitis with a presumed pseudocyst with worsening of the biliary ductal dilatation. Blood cultures were positive for polymicrobial bacteremia. Blood cultures having both Klebsiella and E. Coli  E. coli/Klebsiella sepsis secondary to cholangitis with obstructive jaundice in the setting of pancreatic neuroendocrine tumor -patient was transferred out of ICU. Came in with septic shock-- now resolved. -discontinue IV Solucortef 7/13 -IV unasyn per Dr. Darlyn Read to oral Augmentin (total 14 days) -no fever -white count down to 15K (some could be due to steroids)  Acute on chronic renal failure stage IV -patient had required dialysis during his previous admission at [facility] good urine -removed Foley on 7/13 -has External/condom cath -appreciate Dr. Elwyn Lade input -avoid nephrotoxins  Severe protein calorie malnutrition -follow dietitian recommendation  Biliary obstruction with jaundice secondary to pancreatic neuroendocrine tumor -patient is not a candidate for any surgery -underwent ERCP with biliary stent placement-- direct bilirubin continues to be trending down --9.6--6.2 today --follows by oncology Dr. Janese Banks-- Octreotide shot x 1. Patient will follow-up with Dr. Janese Banks as outpatient for further [facility] rx--this was d/w [name] --patient overall has a poor prognosis --per ICU attending note-- discussion was made with patient's nieces [name] and [name] and they chose no aggressive measures. Treat the treatable -patient is DNR -Per GI Dr Anna--direct bilirubin trending down hold off on further imaging at present.  DVT prophylaxis SCD  H/o Chronic A flutter.afib--follows with Dr Dwana Curd at [facility] was discontinued due to significant drop in PLT count with septic shock. -he is currently  in NSR on tele -Amiodarone is d/ced HR in the 60's -will start po Metoprolol 12.5 mg qd (pt was on 50 mg ) given soft BP. This was d/w dr Ubaldo Glassing (pt was  seen on July 7th)  Hypothyroidism continue Synthroid  Type II diabetes with renal disease continue sliding scale and Lantus   Foley and CL --removed on 05/27/2020 TOC for d/c planning to rehab--niece and pt agrees 7/14>> pt has bed offer for [facility] per TOC  7/15>> insurance auth obtained--d/c today  Pt will f/u dr Janese Banks at the cancer center Palliative care as out pt  Procedures:ERCP Family communication :Niece and POA [name] Consults : ID, oncology, G.I. CODE STATUS: DNR DVT Prophylaxis : SCD  Status is: Inpatient  Remains inpatient appropriate because:waiting for Rehab placement   Dispo: The patient is from: Home  Anticipated d/c is to: TBD  Anticipated d/c date BD:[facility] bed is available  Patient currently is best at baseline at present [facility] has discussed with patient and niece agreement with plan. Patient will follow-up with Dr. Janese Banks at the cancer center. D/c to [facility] today CONSULTS OBTAINED:  Treatment Team:  Teodoro Spray, MD Ottie Glazier, MD Murlean Iba, MD  DRUG ALLERGIES:   Allergies  Allergen Reactions  . Levaquin [Levofloxacin In D5w] Swelling and Rash    DISCHARGE MEDICATIONS:   Allergies as of 05/29/2020      Reactions   Levaquin [levofloxacin In D5w] Swelling, Rash      Medication List    STOP taking these medications   amiodarone 200 MG tablet Commonly known as: PACERONE   apixaban 2.5 MG Tabs tablet Commonly known as: Eliquis   Lantus SoloStar 100 UNIT/ML Solostar Pen Generic drug: insulin glargine Replaced by: insulin glargine 100 UNIT/ML injection     TAKE these medications   acetaminophen 500 MG tablet Commonly known as: TYLENOL Take 500-1,000 mg by mouth every 6 (six) hours as needed for moderate pain or headache.   amoxicillin-clavulanate 500-125 MG tablet Commonly known as: AUGMENTIN Take 1 tablet (500 mg total) by mouth 2 (two) times daily for 6 days.   budesonide-formoterol  80-4.5 MCG/ACT inhaler Commonly known as: SYMBICORT Inhale 2 puffs into the lungs 2 (two) times daily. Takes at noon and at 4pm.   Euthyrox 25 MCG tablet Generic drug: levothyroxine Take 1 tablet (25 mcg total) by mouth every morning. What changed: Another medication with the same name was removed. Continue taking this medication, and follow the directions you see here.   feeding supplement (GLUCERNA SHAKE) Liqd Take 237 mLs by mouth 3 (three) times daily between meals.   insulin glargine 100 UNIT/ML injection Commonly known as: LANTUS Inject 0.12 mLs (12 Units total) into the skin at bedtime. Replaces: Lantus SoloStar 100 UNIT/ML Solostar Pen   metoprolol succinate 25 MG 24 hr tablet Commonly known as: Toprol XL Take 0.5 tablets (12.5 mg total) by mouth daily. Hold if BP < 100 What changed:   medication strength  how much to take  how to take this  when to take this  additional instructions   midodrine 10 MG tablet Commonly known as: PROAMATINE Take 1 tablet (10 mg total) by mouth 3 (three) times daily.   omeprazole 20 MG capsule Commonly known as: PRILOSEC Take 1 capsule by mouth once daily   pantoprazole 40 MG tablet Commonly known as: PROTONIX Take 1 tablet (40 mg total) by mouth daily. Start taking on: May 30, 2020   ProAir HFA 108 (90 Base) MCG/ACT inhaler  Generic drug: albuterol Inhale 2 puffs into the lungs every 4 (four) hours as needed for wheezing or shortness of breath.   rosuvastatin 10 MG tablet Commonly known as: CRESTOR Take 1 tablet (10 mg total) by mouth daily. What changed:   medication strength  how much to take   vitamin B-12 500 MCG tablet Commonly known as: CYANOCOBALAMIN Take 500 mcg by mouth daily.       If you experience worsening of your admission symptoms, develop shortness of breath, life threatening emergency, suicidal or homicidal thoughts you must seek medical attention immediately by calling 911 or calling your MD  immediately  if symptoms less severe.  You Must read complete instructions/literature along with all the possible adverse reactions/side effects for all the Medicines you take and that have been prescribed to you. Take any new Medicines after you have completely understood and accept all the possible adverse reactions/side effects.   Please note  You were cared for by a hospitalist during your hospital stay. If you have any questions about your discharge medications or the care you received while you were in the hospital after you are discharged, you can call the unit and asked to speak with the hospitalist on call if the hospitalist that took care of you is not available. Once you are discharged, your primary care physician will handle any further medical issues. Please note that NO REFILLS for any discharge medications will be authorized once you are discharged, as it is imperative that you return to your primary care physician (or establish a relationship with a primary care physician if you do not have one) for your aftercare needs so that they can reassess your need for medications and monitor your lab values. Today   SUBJECTIVE   Doing well  VITAL SIGNS:  Blood pressure (!) 101/54, pulse 69, temperature 97.9 F (36.6 C), temperature source Oral, resp. rate 16, height 5\' 7"  (1.702 m), weight 63.9 kg, SpO2 97 %.  I/O:    Intake/Output Summary (Last 24 hours) at 05/29/2020 1001 Last data filed at 05/29/2020 0500 Gross per 24 hour  Intake 480 ml  Output 925 ml  Net -445 ml    PHYSICAL EXAMINATION:  GENERAL:  74 y.o.-year-old patient lying in the bed with no acute distress.  EYES: Pupils equal, round, reactive to light and accommodation. + scleral icterus.  HEENT: Head atraumatic, normocephalic. Oropharynx and nasopharynx clear.  LUNGS: Normal breath sounds bilaterally, no wheezing, rales,rhonchi or crepitation. No use of accessory muscles of respiration.  CARDIOVASCULAR: S1, S2  normal. No murmurs, rubs, or gallops. NSR on tele (HR 60-65) ABDOMEN: Soft, non-tender, non-distended. Bowel sounds present. No organomegaly or mass.  EXTREMITIES: No pedal edema, cyanosis, or clubbing.  NEUROLOGIC: weak, grossly  Non-focal PSYCHIATR patient is alert and oriented x 3.  SKIN: No obvious rash, lesion, or ulcer.   DATA REVIEW:   CBC  Recent Labs  Lab 05/28/20 0517  WBC 15.7*  HGB 8.0*  HCT 23.7*  PLT 77*    Chemistries  Recent Labs  Lab 05/28/20 0517 05/28/20 0517 05/29/20 0345  NA 138  139   < > 139  K 3.8  3.8   < > 4.0  CL 100  100   < > 103  CO2 27  27   < > 27  GLUCOSE 130*  127*   < > 77  BUN 99*  100*   < > 96*  CREATININE 5.02*  5.05*   < > 4.79*  CALCIUM 7.6*  7.6*   < > 7.9*  MG 1.9   < > 2.1  AST 67*  --   --   ALT 115*  --   --   ALKPHOS 519*  --   --   BILITOT 6.2*  --   --    < > = values in this interval not displayed.    Microbiology Results   Recent Results (from the past 240 hour(s))  CULTURE, BLOOD (ROUTINE X 2) w Reflex to ID Panel     Status: Abnormal   Collection Time: 05/19/20  3:02 PM   Specimen: BLOOD  Result Value Ref Range Status   Specimen Description   Final    BLOOD RIGHT ANTECUBITAL Performed at [facility], [address], [place], [place] [address]    Special Requests   Final    BOTTLES DRAWN AEROBIC AND ANAEROBIC Blood Culture adequate volume Performed at [facility], [address], [place], [place] [address]    Culture  Setup Time   Final    GRAM NEGATIVE RODS IN BOTH AEROBIC AND ANAEROBIC BOTTLES CRITICAL RESULT CALLED TO, READ BACK BY AND VERIFIED WITH: Hart Robinsons [facility] [id number] 05/20/20 HNM Performed at [facility], [place] [address], [place], [place] [address]    Culture KLEBSIELLA PNEUMONIAE ESCHERICHIA COLI  (A)  Final   Report Status 05/23/2020 FINAL  Final   Organism ID, Bacteria KLEBSIELLA PNEUMONIAE  Final   Organism ID, Bacteria ESCHERICHIA COLI  Final       Susceptibility   Escherichia coli - MIC*    AMPICILLIN 8 SENSITIVE Sensitive     CEFAZOLIN <=4 SENSITIVE Sensitive     CEFEPIME <=0.12 SENSITIVE Sensitive     CEFTAZIDIME <=1 SENSITIVE Sensitive     CEFTRIAXONE <=0.25 SENSITIVE Sensitive     CIPROFLOXACIN <=0.25 SENSITIVE Sensitive     GENTAMICIN <=1 SENSITIVE Sensitive     IMIPENEM <=0.25 SENSITIVE Sensitive     TRIMETH/SULFA <=20 SENSITIVE Sensitive     AMPICILLIN/SULBACTAM <=2 SENSITIVE Sensitive     PIP/TAZO <=4 SENSITIVE Sensitive     * ESCHERICHIA COLI   Klebsiella pneumoniae - MIC*    AMPICILLIN RESISTANT Resistant     CEFAZOLIN <=4 SENSITIVE Sensitive     CEFEPIME <=0.12 SENSITIVE Sensitive     CEFTAZIDIME <=1 SENSITIVE Sensitive     CEFTRIAXONE <=0.25 SENSITIVE Sensitive     CIPROFLOXACIN <=0.25 SENSITIVE Sensitive     GENTAMICIN <=1 SENSITIVE Sensitive     IMIPENEM <=0.25 SENSITIVE Sensitive     TRIMETH/SULFA <=20 SENSITIVE Sensitive     AMPICILLIN/SULBACTAM 4 SENSITIVE Sensitive     PIP/TAZO <=4 SENSITIVE Sensitive     * KLEBSIELLA PNEUMONIAE  Blood Culture ID Panel (Reflexed)     Status: Abnormal   Collection Time: 05/19/20  3:02 PM  Result Value Ref Range Status   Enterococcus species NOT DETECTED NOT DETECTED Final   Listeria monocytogenes NOT DETECTED NOT DETECTED Final   Staphylococcus species NOT DETECTED NOT DETECTED Final   Staphylococcus aureus (BCID) NOT DETECTED NOT DETECTED Final   Streptococcus species NOT DETECTED NOT DETECTED Final   Streptococcus agalactiae NOT DETECTED NOT DETECTED Final   Streptococcus pneumoniae NOT DETECTED NOT DETECTED Final   Streptococcus pyogenes NOT DETECTED NOT DETECTED Final   Acinetobacter baumannii NOT DETECTED NOT DETECTED Final   Enterobacteriaceae species DETECTED (A) NOT DETECTED Final    Comment: CRITICAL RESULT CALLED TO, READ BACK BY AND VERIFIED WITH:  SCOTT HALL PHARMD  0335 05/20/20 HNM    Enterobacter cloacae complex NOT DETECTED NOT DETECTED Final    Escherichia coli DETECTED (A) NOT DETECTED Final    Comment: CRITICAL RESULT CALLED TO, READ BACK BY AND VERIFIED WITH: SCOTT HALL PHARMD 0335 05/20/20 HNM    Klebsiella oxytoca NOT DETECTED NOT DETECTED Final   Klebsiella pneumoniae DETECTED (A) NOT DETECTED Final    Comment: CRITICAL RESULT CALLED TO, READ BACK BY AND VERIFIED WITH: SCOTT HALL PHARMD 0335 05/20/20 HNM    Proteus species NOT DETECTED NOT DETECTED Final   Serratia marcescens NOT DETECTED NOT DETECTED Final   Carbapenem resistance NOT DETECTED NOT DETECTED Final   Haemophilus influenzae NOT DETECTED NOT DETECTED Final   Neisseria meningitidis NOT DETECTED NOT DETECTED Final   Pseudomonas aeruginosa NOT DETECTED NOT DETECTED Final   Candida albicans NOT DETECTED NOT DETECTED Final   Candida glabrata NOT DETECTED NOT DETECTED Final   Candida krusei NOT DETECTED NOT DETECTED Final   Candida parapsilosis NOT DETECTED NOT DETECTED Final   Candida tropicalis NOT DETECTED NOT DETECTED Final    Comment: Performed at [facility], [address] [place], [place] [address]  CULTURE, BLOOD (ROUTINE X 2) w Reflex to ID Panel     Status: Abnormal   Collection Time: 05/19/20  3:14 PM   Specimen: BLOOD  Result Value Ref Range Status   Specimen Description   Final    BLOOD BLOOD RIGHT HAND Performed at [facility], [address] [place], [place] [address]    Special Requests   Final    BOTTLES DRAWN AEROBIC AND ANAEROBIC Blood Culture adequate volume Performed at [facility], [address] [place], [place] [address]    Culture  Setup Time   Final    GRAM NEGATIVE RODS IN BOTH AEROBIC AND ANAEROBIC BOTTLES CRITICAL VALUE NOTED.  VALUE IS CONSISTENT WITH PREVIOUSLY REPORTED AND CALLED VALUE. Performed at [facility], [address] [place], [place] [address]    Culture KLEBSIELLA PNEUMONIAE (A)  Final   Report Status 05/22/2020 FINAL  Final   Organism ID, Bacteria KLEBSIELLA  PNEUMONIAE  Final      Susceptibility   Klebsiella pneumoniae - MIC*    AMPICILLIN RESISTANT Resistant     CEFAZOLIN <=4 SENSITIVE Sensitive     CEFEPIME <=0.12 SENSITIVE Sensitive     CEFTAZIDIME <=1 SENSITIVE Sensitive     CEFTRIAXONE <=0.25 SENSITIVE Sensitive     CIPROFLOXACIN <=0.25 SENSITIVE Sensitive     GENTAMICIN <=1 SENSITIVE Sensitive     IMIPENEM <=0.25 SENSITIVE Sensitive     TRIMETH/SULFA <=20 SENSITIVE Sensitive     AMPICILLIN/SULBACTAM 4 SENSITIVE Sensitive     PIP/TAZO <=4 SENSITIVE Sensitive     * KLEBSIELLA PNEUMONIAE  MRSA PCR Screening     Status: None   Collection Time: 05/21/20  5:24 PM   Specimen: Nasopharyngeal  Result Value Ref Range Status   MRSA by PCR NEGATIVE NEGATIVE Final    Comment:        The GeneXpert MRSA Assay (FDA approved for NASAL specimens only), is one component of a comprehensive MRSA colonization surveillance program. It is not intended to diagnose MRSA infection nor to guide or monitor treatment for MRSA infections. Performed at [facility], [address] [place], [place] [address]     RADIOLOGY:  No results found.   CODE STATUS:     Code Status Orders  (From admission, onward)         Start  Ordered   05/24/20 1928  Do not attempt resuscitation (DNR)  Continuous       Question Answer Comment  In the event of cardiac or respiratory ARREST Do not call a "code blue"   In the event of cardiac or respiratory ARREST Do not perform Intubation, CPR, defibrillation or ACLS   In the event of cardiac or respiratory ARREST Use medication by any route, position, wound care, and other measures to relive pain and suffering. May use oxygen, suction and manual treatment of airway obstruction as needed for comfort.      05/24/20 1927        Code Status History    Date Active Date Inactive Code Status Order ID Comments User Context   05/19/2020 0003 05/24/2020 1927 Full Code [id number]  Orene Desanctis, DO ED   11/25/2018  1958 11/28/2018 1956 Full Code [id number]  Dustin Flock, MD ED   10/14/2017 1310 10/15/2017 1714 Full Code [id number]  Constance Haw, MD Inpatient   10/27/2016 1138 10/28/2016 1429 Full Code [id number]  Deboraha Sprang, MD Inpatient   Advance Care Planning Activity    Advance Directive Documentation     Most Recent Value  Type of Advance Directive Healthcare Power of [place], Living will  Pre-existing out of facility DNR order (yellow form or pink MOST form) --  "MOST" Form in Place? --       TOTAL TIME TAKING CARE OF THIS PATIENT:40 minutes.    Fritzi Mandes M.D  [facility]    CC: Primary care physician; [name], MD

## 2020-05-29 NOTE — TOC Transition Note (Signed)
Transition of Care [facility]) - CM/SW Discharge Note   Patient Details  Name: [name] MRN: [id number] Date of Birth: Mar 14, 1946  Transition of Care [facility]) CM/SW Contact:  Beverly Sessions, RN Phone Number: 05/29/2020, 1:09 PM   Clinical Narrative:     Aida Puffer from [facility].  Patient to discharge to [facility] today.  DC information sent in [place].  Chris at [facility] notified.   Both nieces have been called and updated at patient's request.  Bedside RN to call niece [name] when EMS arrives  Bedside RN to call report.  EMS transport has been set up with [facility] for 1:30 pm  Final next level of care: [place] Barriers to Discharge: No Barriers Identified   Patient Goals and CMS Choice Patient states their goals for this hospitalization and ongoing recovery are:: Wants to feel good and be able to go home and take care of himself CMS Medicare.gov Compare Post Acute Care list provided to:: Patient Choice offered to / list presented to : Patient  Discharge Placement              Patient chooses bed at: [facility] [place] Patient to be transferred to facility by: EMS Name of family member notified: Ms Hoover Browns, and Ms Wynetta Emery Patient and family notified of of transfer: 05/29/20  Discharge Plan and Services   Discharge Planning Services: CM Consult Post Acute Care Choice: Home Health, Resumption of Svcs/PTA Provider                    HH Arranged: RN, PT [facility] Agency: [place] Date [place]: 05/20/20 Time [place]: 1222 Representative spoke with at [place]: [place] ([place]) Interventions     Readmission Risk Interventions Readmission Risk Prevention Plan 05/29/2020  Transportation Screening Complete  PCP or Specialist Appt within 3-5 Days (No Data)  [place] or [place] (No Data)  Palliative Care Screening Not Applicable  Medication Review (RN Care Manager)  Complete  Some recent data might be hidden

## 2020-05-29 NOTE — Progress Notes (Signed)
MD ordered patient to be discharged home.  Discharge instructions were reviewed with RN at [facility] and  they voiced understanding.  Follow-up appointment was made.   IV was removed with catheter intact.  All patients questions were answered.  Patient left via stretcher escorted by EMS.

## 2020-05-30 ENCOUNTER — Inpatient Hospital Stay: Payer: Medicare Other

## 2020-05-30 ENCOUNTER — Inpatient Hospital Stay: Payer: Medicare Other | Admitting: Oncology

## 2020-06-01 DIAGNOSIS — K839 Disease of biliary tract, unspecified: Secondary | ICD-10-CM | POA: Diagnosis not present

## 2020-06-01 DIAGNOSIS — E119 Type 2 diabetes mellitus without complications: Secondary | ICD-10-CM | POA: Diagnosis not present

## 2020-06-01 DIAGNOSIS — Z794 Long term (current) use of insulin: Secondary | ICD-10-CM | POA: Diagnosis not present

## 2020-06-01 DIAGNOSIS — D3A8 Other benign neuroendocrine tumors: Secondary | ICD-10-CM | POA: Diagnosis not present

## 2020-06-05 ENCOUNTER — Inpatient Hospital Stay: Payer: Medicare Other | Admitting: Oncology

## 2020-06-06 ENCOUNTER — Other Ambulatory Visit: Payer: Self-pay

## 2020-06-06 ENCOUNTER — Inpatient Hospital Stay: Payer: Medicare Other

## 2020-06-06 ENCOUNTER — Encounter: Payer: Self-pay | Admitting: Oncology

## 2020-06-06 ENCOUNTER — Inpatient Hospital Stay: Payer: Medicare Other | Attending: Oncology | Admitting: Oncology

## 2020-06-06 VITALS — BP 123/49 | HR 44 | Temp 95.5°F | Resp 16 | Ht 67.0 in | Wt 155.0 lb

## 2020-06-06 DIAGNOSIS — R7989 Other specified abnormal findings of blood chemistry: Secondary | ICD-10-CM

## 2020-06-06 DIAGNOSIS — C250 Malignant neoplasm of head of pancreas: Secondary | ICD-10-CM | POA: Diagnosis not present

## 2020-06-06 DIAGNOSIS — N189 Chronic kidney disease, unspecified: Secondary | ICD-10-CM | POA: Diagnosis not present

## 2020-06-06 DIAGNOSIS — C7A019 Malignant carcinoid tumor of the small intestine, unspecified portion: Secondary | ICD-10-CM

## 2020-06-06 DIAGNOSIS — D3A098 Benign carcinoid tumors of other sites: Secondary | ICD-10-CM

## 2020-06-06 DIAGNOSIS — I130 Hypertensive heart and chronic kidney disease with heart failure and stage 1 through stage 4 chronic kidney disease, or unspecified chronic kidney disease: Secondary | ICD-10-CM | POA: Diagnosis not present

## 2020-06-06 DIAGNOSIS — R945 Abnormal results of liver function studies: Secondary | ICD-10-CM | POA: Insufficient documentation

## 2020-06-06 DIAGNOSIS — N179 Acute kidney failure, unspecified: Secondary | ICD-10-CM | POA: Diagnosis not present

## 2020-06-06 LAB — COMPREHENSIVE METABOLIC PANEL
ALT: 66 U/L — ABNORMAL HIGH (ref 0–44)
AST: 38 U/L (ref 15–41)
Albumin: 3.1 g/dL — ABNORMAL LOW (ref 3.5–5.0)
Alkaline Phosphatase: 634 U/L — ABNORMAL HIGH (ref 38–126)
Anion gap: 9 (ref 5–15)
BUN: 60 mg/dL — ABNORMAL HIGH (ref 8–23)
CO2: 24 mmol/L (ref 22–32)
Calcium: 8.7 mg/dL — ABNORMAL LOW (ref 8.9–10.3)
Chloride: 105 mmol/L (ref 98–111)
Creatinine, Ser: 3.39 mg/dL — ABNORMAL HIGH (ref 0.61–1.24)
GFR calc Af Amer: 19 mL/min — ABNORMAL LOW (ref >60)
GFR calc non Af Amer: 17 mL/min — ABNORMAL LOW (ref >60)
Glucose, Bld: 49 mg/dL — ABNORMAL LOW (ref 70–99)
Potassium: 4.9 mmol/L (ref 3.5–5.1)
Sodium: 138 mmol/L (ref 135–145)
Total Bilirubin: 3.1 mg/dL — ABNORMAL HIGH (ref 0.3–1.2)
Total Protein: 6.2 g/dL — ABNORMAL LOW (ref 6.5–8.1)

## 2020-06-06 LAB — CBC WITH DIFFERENTIAL/PLATELET
Abs Immature Granulocytes: 0.26 10*3/uL — ABNORMAL HIGH (ref 0.00–0.07)
Basophils Absolute: 0.1 10*3/uL (ref 0.0–0.1)
Basophils Relative: 1 %
Eosinophils Absolute: 0 10*3/uL (ref 0.0–0.5)
Eosinophils Relative: 0 %
HCT: 28 % — ABNORMAL LOW (ref 39.0–52.0)
Hemoglobin: 8.6 g/dL — ABNORMAL LOW (ref 13.0–17.0)
Immature Granulocytes: 4 %
Lymphocytes Relative: 7 %
Lymphs Abs: 0.5 10*3/uL — ABNORMAL LOW (ref 0.7–4.0)
MCH: 31.3 pg (ref 26.0–34.0)
MCHC: 30.7 g/dL (ref 30.0–36.0)
MCV: 101.8 fL — ABNORMAL HIGH (ref 80.0–100.0)
Monocytes Absolute: 1 10*3/uL (ref 0.1–1.0)
Monocytes Relative: 14 %
Neutro Abs: 5.4 10*3/uL (ref 1.7–7.7)
Neutrophils Relative %: 74 %
Platelets: 249 10*3/uL (ref 150–400)
RBC: 2.75 MIL/uL — ABNORMAL LOW (ref 4.22–5.81)
RDW: 19.1 % — ABNORMAL HIGH (ref 11.5–15.5)
WBC: 7.3 10*3/uL (ref 4.0–10.5)
nRBC: 0 % (ref 0.0–0.2)

## 2020-06-06 MED ORDER — OXYCODONE HCL 5 MG PO TABS: 5.0000 mg | ORAL_TABLET | Freq: Two times a day (BID) | ORAL | 0 refills | Status: DC | PRN

## 2020-06-06 NOTE — Progress Notes (Signed)
Hematology/Oncology Consult note [facility]  [phone] [phone]  Patient Care Team: [name], MD as PCP - General (Cardiology) [name], MD as PCP - Cardiology (Cardiology) [name], MD as Consulting Physician (Cardiology) Clent Jacks, RN as Registered Nurse   Name of the patient: [name]  [id number]  23-Oct-1946   Date of visit: 06/06/20  Diagnosis- 1.  Metastatic small bowel carcinoid 2.  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 In January 2019 patient was also noted to have 2.2 x 1.7 cm mass in the pancreatic head without hypermetabolism on dotatate PET scan. This was followed by EUS and FNA of the pancreatic head mass which showed:DIAGNOSIS:  A. PANCREATIC HEAD MASS; ENDOSCOPIC ULTRASOUND GUIDED FNA:  - POSITIVE FOR MALIGNANCY.  - NON-DUCTAL NEOPLASM PRESENT, SEE COMMENT.Based on cytologic features differential diagnosis includes neuroendocrine tumor and asthenic cell carcinoma. Case sent to [facility] for second opinion who also favored the diagnosis of neuroendocrine tumor noting that acinar cell carcinoma cannot be excluded  Patient was referred to Dr. Mariah Milling from [facility] and back in December 2020 scan showed stable size of the mass and surgical intervention was not recommended. 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  He also underwent ERCP which showed severe localized biliary stricture and underwent sphincterotomy with balloon sphincteroplasty and brushings again showed inconclusive  diagnosis-neuroendocrine tumor versus acinar cell carcinoma or less likely adenocarcinoma.  Patient was also seen by Dr. Leamon Arnt at [facility] in June 2021 and since patient was not deemed to be a surgical candidate at that time trial of octreotide was recommended.  Shortly following that patient was admitted to the hospital with symptoms of acute cholangitisAnd underwent a repeat ERCP by Dr. Allen Norris during which the prior occluded CBD stent was taken out and a plastic stent was placed into the common bile duct.  Hospital course was complicated by septic shock requiring intubation and pressors but patient eventually recovered and [facility]  Scans at that time showed worsening tumor with enlarging adrenal involvement on the left side increasing soft tissue and ductal dilatation.  Interval history-patient is currently frail and slowly recovering in his rehab.  Reports that his appetite is good and since his hospital discharge he has gained 9 pounds although he does have bilateral lower extremity swelling.  Reports occasional abdominal pain And constipation  ECOG PS- 3 Pain scale- 0   Review of systems- Review of Systems  Constitutional: Positive for malaise/fatigue. Negative for chills, fever and weight loss.  HENT: Negative for congestion, ear discharge and nosebleeds.   Eyes: Negative for blurred vision.  Respiratory: Negative for cough, hemoptysis, sputum production, shortness of breath and wheezing.   Cardiovascular: Negative for chest pain, palpitations, orthopnea and claudication.  Gastrointestinal: Positive for abdominal pain and constipation. Negative for blood in stool, diarrhea, heartburn, melena, nausea and vomiting.  Genitourinary: Negative for dysuria, flank pain, frequency, hematuria and urgency.  Musculoskeletal: Negative for back pain, joint pain and myalgias.  Skin: Negative for rash.  Neurological: Negative for dizziness, tingling, focal weakness, seizures,  weakness and headaches.  Endo/Heme/Allergies: Does not bruise/bleed  easily.  Psychiatric/Behavioral: Negative for depression and suicidal ideas. The patient does not have insomnia.       Allergies  Allergen Reactions  . Levaquin [Levofloxacin In D5w] Swelling and Rash     Past Medical History:  Diagnosis Date  . Aortic atherosclerosis ([place])    As seen on 04/2019 CT  . Atrial flutter ([place])    a. s/p ablation 2017/2018 and DCCV 2015/2016/2019 b. 2017 echo EF 60-65%, no RWMA, mild MR,  . CAD (coronary artery disease)    3v CAD 04/2019 CT  . Carcinoid tumor    metastatic  . CKD (chronic kidney disease), stage III   . COPD (chronic obstructive pulmonary disease) (HCC)    PM oxygen  . Current occasional smoker    1-2 cigarettes / week  . Diabetes mellitus type 2, insulin dependent ([place])   . Heart failure with preserved ejection fraction ([place])    2017 echo EF 60-65%, no RWMA, mild MR,    . Hyperlipidemia   . Hypertension   . Pancreatitis   . Polycythemia      Past Surgical History:  Procedure Laterality Date  . A-FLUTTER ABLATION N/A 10/14/2017   Procedure: A-FLUTTER ABLATION;  Surgeon: Constance Haw, MD;  Location: [place] [facility];  Service: Cardiovascular;  Laterality: N/A;  . CARDIOVERSION N/A 09/08/2017   Procedure: CARDIOVERSION;  Surgeon: [name], MD;  Location: [facility] OR[facility];  Service: Cardiovascular;  Laterality: N/A;  . CARDIOVERSION N/A 03/22/2018   Procedure: CARDIOVERSION;  Surgeon: [name], MD;  Location: [facility];  Service: Cardiovascular;  Laterality: N/A;  . COLONOSCOPY    . COLONOSCOPY WITH PROPOFOL N/A 10/31/2019   Procedure: COLONOSCOPY WITH PROPOFOL;  Surgeon: Lin Landsman, MD;  Location: [facility] ENDOSCOPY;  Service: Gastroenterology;  Laterality: N/A;  . ELECTROPHYSIOLOGIC STUDY N/A 10/27/2016   Procedure: A-Flutter Ablation;  Surgeon: Deboraha Sprang, MD;  Location: [place] [facility];  Service: Cardiovascular;  Laterality: N/A;    . ENDOSCOPIC RETROGRADE CHOLANGIOPANCREATOGRAPHY (ERCP) WITH PROPOFOL N/A 05/21/2020   Procedure: ENDOSCOPIC RETROGRADE CHOLANGIOPANCREATOGRAPHY (ERCP) WITH PROPOFOL;  Surgeon: [name], MD;  Location: [facility] ENDOSCOPY;  Service: Endoscopy;  Laterality: N/A;  . EUS N/A 12/22/2017   Procedure: FULL UPPER ENDOSCOPIC ULTRASOUND (EUS) RADIAL;  Surgeon: Reita Cliche, MD;  Location: [facility] ENDOSCOPY;  Service: Gastroenterology;  Laterality: N/A;  . TEE WITH CARDIOVERSION  12/16  . TEMPORARY DIALYSIS CATHETER N/A 02/08/2020   Procedure: TEMPORARY DIALYSIS CATHETER;  Surgeon: Algernon Huxley, MD;  Location: [place] [facility];  Service: Cardiovascular;  Laterality: N/A;    Social History   Socioeconomic History  . Marital status: Widowed    Spouse name: Not on file  . Number of children: Not on file  . Years of education: Not on file  . Highest education level: Not on file  Occupational History  . Not on file  Tobacco Use  . Smoking status: Former Smoker    Years: 45.00    Types: Cigarettes    Quit date: 02/02/2020    Years since quitting: 0.3  . Smokeless tobacco: Never Used  Vaping Use  . Vaping Use: Never used  Substance and Sexual Activity  . Alcohol use: Not Currently  . Drug use: No  . Sexual activity: Not Currently  Other Topics Concern  . Not on file  Social History Narrative  . Not on file   Social Determinants of Health   Financial Resource Strain:   . Difficulty of Paying Living  Expenses:   Food Insecurity:   . Worried About Charity fundraiser in the Last Year:   . Arboriculturist in the Last Year:   Transportation Needs:   . Film/video editor (Medical):   Marland Kitchen Lack of Transportation (Non-Medical):   Physical Activity:   . Days of Exercise per Week:   . Minutes of Exercise per Session:   Stress:   . Feeling of Stress :   Social Connections:   . Frequency of Communication with Friends and Family:   . Frequency of Social Gatherings with Friends and Family:    . Attends Religious Services:   . Active Member of Clubs or Organizations:   . Attends Archivist Meetings:   Marland Kitchen Marital Status:   Intimate Partner Violence:   . Fear of Current or Ex-Partner:   . Emotionally Abused:   Marland Kitchen Physically Abused:   . Sexually Abused:     Family History  Problem Relation Age of Onset  . Hypertension Father      Current Outpatient Medications:  .  acetaminophen (TYLENOL) 500 MG tablet, Take 500-1,000 mg by mouth every 6 (six) hours as needed for moderate pain or headache. , Disp: , Rfl:  .  Amino Acids-Protein Hydrolys (PRO-STAT 101 PO), Take 30 mLs by mouth daily., Disp: , Rfl:  .  amLODipine (NORVASC) 2.5 MG tablet, Take 2.5 mg by mouth daily., Disp: , Rfl:  .  aspirin EC 81 MG tablet, Take 81 mg by mouth daily. Swallow whole., Disp: , Rfl:  .  atorvastatin (LIPITOR) 40 MG tablet, Take 40 mg by mouth daily., Disp: , Rfl:  .  clopidogrel (PLAVIX) 75 MG tablet, Take 75 mg by mouth daily., Disp: , Rfl:  .  docusate sodium (COLACE) 100 MG capsule, Take 100 mg by mouth daily as needed for mild constipation., Disp: , Rfl:  .  feeding supplement, GLUCERNA SHAKE, (GLUCERNA SHAKE) LIQD, Take 237 mLs by mouth 3 (three) times daily between meals., Disp: 237 mL, Rfl: 0 .  Infant Care Products [facility] EX), Apply 1 application topically as needed (with every incontence)., Disp: , Rfl:  .  insulin glargine (LANTUS) 100 UNIT/ML injection, Inject 0.12 mLs (12 Units total) into the skin at bedtime. (Patient taking differently: Inject 22 Units into the skin at bedtime. ), Disp: 10 mL, Rfl: 11 .  lisinopril-hydrochlorothiazide (ZESTORETIC) 10-12.5 MG tablet, Take 1 tablet by mouth daily., Disp: , Rfl:  .  meclizine (ANTIVERT) 25 MG tablet, Take 25 mg by mouth 2 (two) times daily., Disp: , Rfl:  .  Multiple Vitamin (MULTIVITAMIN ADULT PO), Take 1 tablet by mouth daily., Disp: , Rfl:  .  ondansetron (ZOFRAN) 4 MG tablet, Take 4 mg by mouth every 6 (six) hours as  needed for nausea or vomiting., Disp: , Rfl:  .  vitamin C (ASCORBIC ACID) 250 MG tablet, Take 250 mg by mouth daily., Disp: , Rfl:  .  oxyCODONE (OXY IR/ROXICODONE) 5 MG immediate release tablet, Take 1 tablet (5 mg total) by mouth every 12 (twelve) hours as needed for severe pain., Disp: 60 tablet, Rfl: 0  Physical exam:  Vitals:   06/06/20 1019  BP: (!) 123/49  Pulse: (!) 44  Resp: 16  Temp: (!) 95.5 F (35.3 C)  TempSrc: Oral  Weight: 155 lb (70.3 kg)  Height: 5\' 7"  (1.702 m)   Physical Exam Constitutional:      Comments: Sitting in a wheelchair.  Appears frail and tremulous  Eyes:  Comments: Sclerae icteric  Cardiovascular:     Rate and Rhythm: Normal rate and regular rhythm.     Heart sounds: Normal heart sounds.  Pulmonary:     Effort: Pulmonary effort is normal.     Breath sounds: Normal breath sounds.  Abdominal:     General: Bowel sounds are normal. There is no distension.     Palpations: Abdomen is soft.     Tenderness: There is no abdominal tenderness.  Musculoskeletal:     Right lower leg: Edema present.     Left lower leg: Edema present.  Skin:    General: Skin is warm and dry.  Neurological:     Mental Status: He is alert and oriented to person, place, and time.      CMP Latest Ref Rng & Units 06/06/2020  Glucose 70 - 99 mg/dL 49(L)  BUN 8 - 23 mg/dL 60(H)  Creatinine 0.61 - 1.24 mg/dL 3.39(H)  Sodium 135 - 145 mmol/L 138  Potassium 3.5 - 5.1 mmol/L 4.9  Chloride 98 - 111 mmol/L 105  CO2 22 - 32 mmol/L 24  Calcium 8.9 - 10.3 mg/dL 8.7(L)  Total Protein 6.5 - 8.1 g/dL 6.2(L)  Total Bilirubin 0.3 - 1.2 mg/dL 3.1(H)  Alkaline Phos 38 - 126 U/L 634(H)  AST 15 - 41 U/L 38  ALT 0 - 44 U/L 66(H)   CBC Latest Ref Rng & Units 06/06/2020  WBC 4.0 - 10.5 K/uL 7.3  Hemoglobin 13.0 - 17.0 g/dL 8.6(L)  Hematocrit 39 - 52 % 28.0(L)  Platelets 150 - 400 K/uL 249    No images are attached to the encounter.  CT ABDOMEN PELVIS WO CONTRAST  Result Date:  05/19/2020 CLINICAL DATA:  Abdominal pain with nausea in the setting of pancreatic neuroendocrine tumor EXAM: CT ABDOMEN AND PELVIS WITHOUT CONTRAST TECHNIQUE: Multidetector CT imaging of the abdomen and pelvis was performed following the standard protocol without IV contrast. COMPARISON:  February 08, 2020 FINDINGS: Lower chest: 4 mm LEFT lower lobe pulmonary nodule along the pleural surface not present on March of 2021. No consolidation. No pleural effusion. Hepatobiliary: Post placement of biliary stent. Despite presence of stent the common bile duct is more dilated than on the previous exam measuring up to 19 mm. Previously approximately 13 mm. Masslike areas in the pancreatic head are similar. There is been interval development of low attenuation in the pancreatic neck that was not present previously approximately 15 mm (image 28, series 2) Also further dilation of the distal pancreatic duct is suggested with increase in the soft tissue density in the mid body of the pancreas. This area measuring approximately 3.9 cm greatest thickness at the site of ductal transition where as on the prior study it measured approximately 3.5 cm. Extending from the upper margin of the dilated duct at the site of ductal transition is a low-attenuation ovoid area measuring water density at 5 by 3.1 cm, abutting the lesser curvature. (Image 19, series 2) previously measuring approximately 3.9 x 3.0 cm in this location peripancreatic stranding is mildly increased when compared to the prior study. Soft tissue lesion associated with LEFT adrenal contiguous with with increase in size (image 21, series 2) 2.6 x 2.7 cm, previously 2.5 x 2.4 cm. Pancreas: As above Spleen: Spleen grossly normal on noncontrast imaging. Adrenals/Urinary Tract: Adrenal gland on the RIGHT is normal. LEFT adrenal mass contiguous with the pancreas enlarging as described. Mild perinephric stranding and in general anterior pararenal stranding on the LEFT. No  hydronephrosis.  Urinary bladder is unremarkable. Stomach/Bowel: Stomach with mild to moderate distension. Mild dilation of small bowel loops becomes moderately dilated in the mid small bowel at the level of the jejunum. There is peristaltic activity versus is transition in the distal jejunum at 3.6 cm caliber proximal to the site of narrowing with decompression of more distal small bowel loops. Colon is largely stool filled. Stool like material is present in the terminal ileum. Vascular/Lymphatic: Calcified atheromatous plaque of the abdominal aorta. No aneurysmal dilation. Adenopathy in the mesentery, largest measuring 11 mm is unchanged since the previous exam. Calcified mesenteric soft tissue (image 44, series 2) approximately 2.7 x 1.8 cm enlarged in short axis though difficult to measure given potential changes in mesenteric position over time. Reproductive: No adenopathy in the pelvis. Other: No free air.  No ascites. Musculoskeletal: No acute musculoskeletal process. Spinal degenerative changes. IMPRESSION: 1. 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 2. Worsening of biliary duct distension above the level of the stent since previous imaging from March of 2021 3. Increasing distension of small bowel in the proximal small bowel with site of transition in the distal jejunum. Is unclear whether this represents vigorous peristaltic activity following contrast administration or developing small bowel obstruction or ileus. Subsequent follow-up based on symptoms with radiographs may be helpful. 4. Increasing soft tissue and increasing ductal dilation with at least 2 sites of transition in this patient with known neuroendocrine tumor suspicious for worsening tumor with enlarging adrenal involvement on the LEFT as discussed, at the site of previous more benign appearing LEFT adrenal neoplasm now inseparable from stranding and  soft tissue in the region of the pancreatic tail. 5. Given appearance it is possible that direct extension of stranding from the pancreas towards the LEFT adrenal may relate to inflammation. Attention on follow-up. 6. Extensive portosystemic collaterals in the upper abdomen compatible with splenic venous compromise, not well assessed but this secondary finding is strongly suggestive of splenic venous compromise due to worsening tumor in the region of the pancreas. Electronically Signed   By: Zetta Bills M.D.   On: 05/19/2020 12:00   DG Chest Port 1 View  Result Date: 05/21/2020 CLINICAL DATA:  Central line placement. EXAM: PORTABLE CHEST 1 VIEW COMPARISON:  Earlier this day. FINDINGS: Tip of the left internal jugular central venous catheter projects over the mid SVC. No pneumothorax. Endotracheal tube tip remains at the thoracic inlet. Normal heart size with unchanged mediastinal contours. Vascular congestion with chronic interstitial coarsening. Slight increase in left lung base atelectasis. No large pleural effusion. IMPRESSION: 1. Tip of the left internal jugular central venous catheter projects over the mid SVC. No pneumothorax. 2. Slight increase in left lung base atelectasis. Otherwise unchanged appearance of the chest. Electronically Signed   By: Keith Rake M.D.   On: 05/21/2020 20:53   DG Chest Port 1 View  Result Date: 05/21/2020 CLINICAL DATA:  Atrial fibrillation, pancreatitis, intubation EXAM: PORTABLE CHEST 1 VIEW COMPARISON:  Radiograph 02/08/2020 FINDINGS: Endotracheal tube 4.5 cm from the carina. Telemetry leads overlie the chest. Chronically coarsened interstitial opacities are present throughout the lungs, similar to the comparison. Hypoventilatory changes likely increased prominence of these findings. Mild central vascular congestion without convincing features of edema. No pneumothorax or effusion. The aorta is calcified. The remaining cardiomediastinal contours are unremarkable.  No acute osseous or soft tissue abnormality. Degenerative changes are present in the imaged spine and shoulders. IMPRESSION: 1.  Endotracheal tube 4.5 cm from the carina. 2. Chronically coarsened interstitial opacities, similar to the comparison. 3. Mild central vascular congestion without convincing features of edema. 4. Atelectasis. Electronically Signed   By: Lovena Le M.D.   On: 05/21/2020 19:09   DG C-Arm 1-60 Min-No Report  Result Date: 05/21/2020 Fluoroscopy was utilized by the requesting physician.  No radiographic interpretation.   ECHOCARDIOGRAM COMPLETE  Result Date: 05/22/2020    ECHOCARDIOGRAM REPORT   Patient Name:   RODOLPH HAGEMANN [facility] Date of Exam: 05/22/2020 Medical Rec #:  [id number]             Height:       67.0 in Accession #:    [id number]            Weight:       140.9 lb Date of Birth:  1946-01-12             BSA:          1.742 m Patient Age:    74 years              BP:           97/68 mmHg Patient Gender: M                     HR:           62 bpm. Exam Location:  [facility] Procedure: 2D Echo, Cardiac Doppler and Color Doppler Indications:     Shock  History:         Patient has prior history of Echocardiogram examinations, most                  recent 09/23/2016. COPD, Arrythmias:Atrial Flutter; Risk                  Factors:Hypertension, Diabetes and Dyslipidemia.  Sonographer:     Sherrie Sport RDCS (AE) Referring Phys:  [id number] Bradly Bienenstock Diagnosing Phys: Serafina Royals MD  Sonographer Comments: No parasternal window, no apical window and echo performed with patient supine and on artificial respirator. The subcostal was the only view obtainable. and Image acquisition challenging due to COPD. IMPRESSIONS  1. Left ventricular ejection fraction, by estimation, is 35 to 40%. The left ventricle has moderately decreased function. The left ventricle demonstrates regional wall motion abnormalities (see scoring diagram/findings for description). Left ventricular  diastolic parameters were  normal.  2. Right ventricular systolic function is moderately reduced. The right ventricular size is moderately enlarged. Mildly increased right ventricular wall thickness. There is severely elevated pulmonary artery systolic pressure.  3. Left atrial size was mild to moderately dilated.  4. Right atrial size was mild to moderately dilated.  5. The mitral valve is normal in structure. Mild to moderate mitral valve regurgitation.  6. Tricuspid valve regurgitation is moderate.  7. The aortic valve is normal in structure. Aortic valve regurgitation is trivial. FINDINGS  Left Ventricle: Left ventricular ejection fraction, by estimation, is 35 to 40%. The left ventricle has moderately decreased function. The left ventricle demonstrates regional wall motion abnormalities. Moderate hypokinesis of the left ventricular, entire inferoseptal wall. The left ventricular internal cavity size was normal in size. There is no left ventricular hypertrophy. Left ventricular diastolic parameters were normal. Right Ventricle: The right ventricular size is moderately enlarged. Mildly increased right ventricular wall thickness. Right ventricular systolic function is moderately reduced. There is severely elevated pulmonary artery systolic pressure. The tricuspid  regurgitant velocity is 3.80 m/s,  and with an assumed right atrial pressure of 10 mmHg, the estimated right ventricular systolic pressure is 95.6 mmHg. Left Atrium: Left atrial size was mild to moderately dilated. Right Atrium: Right atrial size was mild to moderately dilated. Pericardium: There is no evidence of pericardial effusion. Mitral Valve: The mitral valve is normal in structure. Mild to moderate mitral valve regurgitation. Tricuspid Valve: The tricuspid valve is normal in structure. Tricuspid valve regurgitation is moderate. Aortic Valve: The aortic valve is normal in structure. Aortic valve regurgitation is trivial. Pulmonic Valve: The pulmonic valve was normal in  structure. Pulmonic valve regurgitation is mild. Aorta: The aortic root and ascending aorta are structurally normal, with no evidence of dilitation. IAS/Shunts: No atrial level shunt detected by color flow Doppler.  LEFT VENTRICLE PLAX 2D LVIDd:         4.23 cm LVIDs:         3.60 cm LV PW:         1.36 cm LV IVS:        0.91 cm  LEFT ATRIUM         Index LA diam:    5.10 cm 2.93 cm/m  PULMONIC VALVE PV Vmax:        0.83 m/s PV Peak grad:   2.7 mmHg RVOT Peak grad: 2 mmHg  TRICUSPID VALVE TR Peak grad:   57.8 mmHg TR Vmax:        380.00 cm/s Serafina Royals MD Electronically signed by Serafina Royals MD Signature Date/Time: 05/22/2020/4:43:08 PM    Final      Assessment and plan- Patient is a 74 y.o. male with known neuroendocrine tumor of the mesentery and pancreatic head mass (neuroendocrine versus acinar cell carcinoma).  This is a post hospital discharge follow-up  1.  With regards to pancreatic head mass which is possibly a carcinoid along with carcinoid tumor involving the mesentery; most recent scans have shown tumor progression.  He is not a candidate for any surgical intervention.  He is currently frail with a performance status of 3.  Family would like to see if the carcinoid shots can help to control this.  He did receive his first shot in the hospital and will be due for his second shot on 06/17/2020 and he will be seen by covering NP with CBC with differential and CMP.  I will see him back when he will be due for his third octreotide shot with same labs.  2.  Patient had a significant complicated hospital course due to septic shock from acute cholangitis requiring intubation and pressor support.  He has made a remarkable recovery from all of that. His total bilirubin is now down to 3.31 which is close to his baseline since March and creatinine has also improved to 3.3.  Continue to monitor  3.  Patient has a plastic stent in place and we will get in touch with [facility] to see when this  needs to be replaced with another metallic stent.  Patient would like to get his ERCP if need be at [name]  He is significantly deconditioned and frail at this time   Total face to face encounter time for this patient visit was 30 min.     Visit Diagnosis 1. Malignant neoplasm of head of pancreas ([place])   2. Malignant carcinoid tumor of small intestine, unspecified location ([place])   3. AKI (acute kidney injury) ([place])   4. Chronic kidney disease, unspecified CKD stage   5. Abnormal LFTs  Dr. Randa Evens, MD, MPH [facility] [id number] 06/06/2020 12:22 PM

## 2020-06-06 NOTE — Progress Notes (Signed)
Pt not in pain today but when his stomach hurts him so bad , he needs pain pill per family member. Pt states it does get to hurting at times and it is bad. He gets sore when he get PT-I told him maybe take tylenol. He does want to con. Octreotide inj. His appetite improving about 50 % right now. Having some hard stools and getting colace only.

## 2020-06-06 NOTE — Progress Notes (Signed)
Cardiology Office Note    Date:  06/18/2020   ID:  [name], [name] 02-01-1946, MRN [id number]  PCP:  [name], MD  Cardiologist:  Ida Rogue, MD  Electrophysiologist:  None   Chief Complaint: Follow up  History of Present Illness:   [name] is a 74 y.o. 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  He was initially diagnosed with atrial flutter in 2002 and has undergone multiple prior cardioversions.  He underwent ablation for typical atrial flutter in 08/2016, though subsequently had recurrence of atrial arrhythmia requiring DCCV.  Echo in 09/2016 showed an EF of 60 to 65%, no regional wall motion abnormalities, mild mitral regurgitation, normal size left atrium, normal RV systolic function, and normal PASP.  He underwent repeat ablation of the cavotricuspid isthmus in 09/2017 by Dr. Curt Bears.    More recently, he was admitted to OSH in 02/2020 for his biliary obstruction complicated by septic shock, AKI requiring temporary dialysis, and GI bleeding.  Apixaban was temporarily suspended and ultimately resumed at reduced dose to minimize bleeding risk despite dosing criteria.  He was also continued on amiodarone.  Echo during this admission, showed an EF of 50%, apical and septal wall hypokinesis, mildly decreased RV systolic function with mildly enlarged RV cavity size, trivial mitral regurgitation, trivial tricuspid regurgitation, negative bubble study, patient was tachycardic throughout the study  and a rate of 125 bpm.  He was evaluated by [facility] in 04/2020 and was maintaining sinus rhythm at that time.  After review of the patient's past medical history including significant comorbid conditions with recent acute illnesses, and in the setting of the patient maintaining sinus rhythm continued medical therapy with amiodarone and apixaban was preferred.  He was admitted to the hospital in 05/2020 with sepsis secondary to E. coli and Klebsiella bacteremia secondary to cholangitis with obstructive jaundice in the setting of pancreatic neuroendocrine tumor requiring mechanical ventilation and vasopressor support.  During this admission, an outside cardiology group was consulted on 05/23/2020, with note not complete for review at time of today's visit.  Primary service discontinued his Eliquis with thrombocytopenia with a low platelet count noted of 49 on 05/20/2020 with subsequent improvement in value moving forward.  His amiodarone was also discontinued with heart rates in the 60s bpm.  He was started on Toprol-XL 12.5 mg daily given soft BP.  It appears this was discussed with [facility] cardiology per discharge summary.  Echo during that admission showed an EF of 35 to 40%, normal LV diastolic function parameters, moderately reduced RV systolic function with moderately large RV cavity size and mildly increased RV wall thickness, elevated PASP estimated at 67.8 mmHg, mild to moderate biatrial enlargement, mild to moderate mitral regurgitation, moderate tricuspid regurgitation, and trivial aortic insufficiency.  He was seen in the [place] ED on 06/17/2020 with abdominal pain and diarrhea noted earlier that day.  EKG shows a narrow complex rhythm with significant underlying baseline artifact making further interpretation difficult.  BP 93/58.  Symptoms improved while in the ED and he was discharged to outpatient follow-up.  He is  scheduled to undergo repeat ERCP on 06/25/2020.  He comes in accompanied by one of his  nieces today.  From a cardiac perspective he indicates he is doing well.  He denies any chest pain, dyspnea, palpitations, dizziness, presyncope, or syncope.  He does note some mild lower extremity swelling and typically sits with his feet hanging down throughout most of the day.  When compared to his last in-person clinic visit in 09/2019, his weight is down 24 pounds.  He does report a good appetite and denies any symptoms of orthopnea, abdominal distention, PND, or early satiety.  He is not adding salt to food and drinks less than 2 L of liquids per day.  There is significant confusion among the patient and family regarding the patient's medication list.  They do not have their pills with them today and are not certain what he is actually taking.  In reviewing his medication list this afternoon I see that aspirin and clopidogrel have been added to his list.  It appears these 2 medicines were added by oncology on 06/06/2020.  We have contacted his pharmacy and they have never received a prescription for these medicines nor have these medicines ever been filled.  In reviewing Epic, I do not see that Plavix has ever been prescribed for this patient.  We have also received a clearance from the patient's GI doctor at [facility] requesting recommendations on Plavix.  Upon reviewing care everywhere I do not see that Plavix is listed on his medication list.  Secondly, the patient has amlodipine on his medication list which has not been filled in 2 years.  Lastly, lisinopril/HCTZ is on his list and his pharmacy has never received a prescription for this medication.  I have asked the patient's niece to bring all pill bottles by the office tomorrow, 06/19/2020 for [place] to review and provide further recommendations regarding his medical therapy.  They do both indicate that he has continued to take Eliquis, despite being told to discontinue this medication by the hospitalist service during his most recent admission to [facility] last month.   With this, he denies any hematochezia, melena, or falls.  As above, this will be reviewed when they bring in his medications.   Labs independently reviewed: 06/2020 - Hgb 9.4, PLT 108, potassium 4.2, BUN 40, serum creatinine 3.4, albumin 3.3, AST/ALT normal 05/2020 - magnesium 2.1, A1c 8.2 11/2018 - TC 79, TG 98, HDL 32, LDL 27 11/2016 - TSH normal  Past Medical History:  Diagnosis Date  . Aortic atherosclerosis ([place])    As seen on 04/2019 CT  . Atrial flutter ([place])    a. s/p ablation 2017/2018 and DCCV 2015/2016/2019 b. 2017 echo EF 60-65%, no RWMA, mild MR,  . CAD (coronary artery disease)    3v CAD 04/2019 CT  . Cancer ([place])   . Carcinoid tumor    metastatic  . CKD (chronic kidney disease), stage III   . COPD (chronic obstructive pulmonary disease) ([facility])    PM oxygen  . Current occasional smoker    1-2 cigarettes / week  . Diabetes mellitus type 2, insulin dependent ([place])   . Heart failure with preserved ejection fraction ([place])    2017 echo EF 60-65%, no RWMA, mild MR,    . Hyperlipidemia   . Hypertension   . Pancreatitis   . Polycythemia     Past Surgical History:  Procedure Laterality Date  . A-FLUTTER ABLATION N/A 10/14/2017   Procedure: A-FLUTTER ABLATION;  Surgeon: Constance Haw,  MD;  Location: [place] CV LAB;  Service: Cardiovascular;  Laterality: N/A;  . CARDIOVERSION N/A 09/08/2017   Procedure: CARDIOVERSION;  Surgeon: [name], MD;  Location: [facility] ORS;  Service: Cardiovascular;  Laterality: N/A;  . CARDIOVERSION N/A 03/22/2018   Procedure: CARDIOVERSION;  Surgeon: [name], MD;  Location: [facility] ORS;  Service: Cardiovascular;  Laterality: N/A;  . COLONOSCOPY    . COLONOSCOPY WITH PROPOFOL N/A 10/31/2019   Procedure: COLONOSCOPY WITH PROPOFOL;  Surgeon: Lin Landsman, MD;  Location: [facility] ENDOSCOPY;  Service: Gastroenterology;  Laterality: N/A;  . ELECTROPHYSIOLOGIC STUDY N/A 10/27/2016   Procedure: A-Flutter Ablation;  Surgeon: Deboraha Sprang, MD;  Location: [place] CV LAB;  Service: Cardiovascular;  Laterality: N/A;  . ENDOSCOPIC RETROGRADE CHOLANGIOPANCREATOGRAPHY (ERCP) WITH PROPOFOL N/A 05/21/2020   Procedure: ENDOSCOPIC RETROGRADE CHOLANGIOPANCREATOGRAPHY (ERCP) WITH PROPOFOL;  Surgeon: [name], MD;  Location: [facility] ENDOSCOPY;  Service: Endoscopy;  Laterality: N/A;  . EUS N/A 12/22/2017   Procedure: FULL UPPER ENDOSCOPIC ULTRASOUND (EUS) RADIAL;  Surgeon: Reita Cliche, MD;  Location: [facility] ENDOSCOPY;  Service: Gastroenterology;  Laterality: N/A;  . TEE WITH CARDIOVERSION  12/16  . TEMPORARY DIALYSIS CATHETER N/A 02/08/2020   Procedure: TEMPORARY DIALYSIS CATHETER;  Surgeon: Algernon Huxley, MD;  Location: [place] CV LAB;  Service: Cardiovascular;  Laterality: N/A;    Current Medications: Current Meds  Medication Sig  . acetaminophen (TYLENOL) 500 MG tablet Take 500-1,000 mg by mouth every 6 (six) hours as needed for moderate pain or headache.   . Amino Acids-Protein Hydrolys (PRO-STAT 101 PO) Take 30 mLs by mouth daily.  Marland Kitchen amLODipine (NORVASC) 2.5 MG tablet Take 2.5 mg by mouth daily.  Marland Kitchen aspirin EC 81 MG tablet Take 81 mg by mouth daily. Swallow whole.  Marland Kitchen atorvastatin (LIPITOR) 40 MG tablet Take 40 mg by mouth daily.  . clopidogrel (PLAVIX) 75 MG tablet Take 75 mg by mouth daily.  Marland Kitchen docusate sodium (COLACE) 100 MG capsule Take 100 mg by mouth daily as needed for mild constipation.  . Infant Care Products (DERMACLOUD EX) Apply 1 application topically as needed (with every incontence).  . insulin glargine (LANTUS) 100 UNIT/ML injection Inject 0.12 mLs (12 Units total) into the skin at bedtime. (Patient taking differently: Inject 22 Units into the skin at bedtime. )  . lisinopril-hydrochlorothiazide (ZESTORETIC) 10-12.5 MG tablet Take 1 tablet by mouth daily.  . meclizine (ANTIVERT) 25 MG tablet Take 25 mg by mouth 2 (two) times daily.  . Multiple Vitamin (MULTIVITAMIN ADULT PO) Take 1 tablet by mouth daily.  .  ondansetron (ZOFRAN) 4 MG tablet Take 4 mg by mouth every 6 (six) hours as needed for nausea or vomiting.  Marland Kitchen oxyCODONE (OXY IR/ROXICODONE) 5 MG immediate release tablet Take 1 tablet (5 mg total) by mouth every 12 (twelve) hours as needed for severe pain.  . vitamin C (ASCORBIC ACID) 250 MG tablet Take 250 mg by mouth daily.    Allergies:   Levaquin [levofloxacin in d5w]   Social History   Socioeconomic History  . Marital status: Widowed    Spouse name: Not on file  . Number of children: Not on file  . Years of education: Not on file  . Highest education level: Not on file  Occupational History  . Not on file  Tobacco Use  . Smoking status: Former Smoker    Years: 45.00    Types: Cigarettes    Quit date: 02/02/2020    Years since quitting: 0.3  .  Smokeless tobacco: Never Used  Vaping Use  . Vaping Use: Never used  Substance and Sexual Activity  . Alcohol use: Not Currently  . Drug use: No  . Sexual activity: Not Currently  Other Topics Concern  . Not on file  Social History Narrative  . Not on file   Social Determinants of Health   Financial Resource Strain:   . Difficulty of Paying Living Expenses:   Food Insecurity:   . Worried About Charity fundraiser in the Last Year:   . Arboriculturist in the Last Year:   Transportation Needs:   . Film/video editor (Medical):   Marland Kitchen Lack of Transportation (Non-Medical):   Physical Activity:   . Days of Exercise per Week:   . Minutes of Exercise per Session:   Stress:   . Feeling of Stress :   Social Connections:   . Frequency of Communication with Friends and Family:   . Frequency of Social Gatherings with Friends and Family:   . Attends Religious Services:   . Active Member of Clubs or Organizations:   . Attends Archivist Meetings:   Marland Kitchen Marital Status:      Family History:  The patient's family history includes Hypertension in his father.  ROS:   Review of Systems  Constitutional: Positive for  malaise/fatigue and weight loss. Negative for chills, diaphoresis and fever.  HENT: Negative for congestion.   Eyes: Negative for discharge and redness.  Respiratory: Negative for cough, sputum production, shortness of breath and wheezing.   Cardiovascular: Negative for chest pain, palpitations, orthopnea, claudication, leg swelling and PND.  Gastrointestinal: Negative for abdominal pain, blood in stool, heartburn, melena, nausea and vomiting.  Musculoskeletal: Negative for falls and myalgias.  Skin: Negative for rash.  Neurological: Positive for weakness. Negative for dizziness, tingling, tremors, sensory change, speech change, focal weakness and loss of consciousness.  Endo/Heme/Allergies: Does not bruise/bleed easily.  Psychiatric/Behavioral: Negative for substance abuse. The patient is not nervous/anxious.   All other systems reviewed and are negative.    EKGs/Labs/Other Studies Reviewed:    Studies reviewed were summarized above. The additional studies were reviewed today:  2D echo 05/2020: 1. Left ventricular ejection fraction, by estimation, is 35 to 40%. The  left ventricle has moderately decreased function. The left ventricle  demonstrates regional wall motion abnormalities (see scoring  diagram/findings for description). Left ventricular  diastolic parameters were normal.  2. Right ventricular systolic function is moderately reduced. The right  ventricular size is moderately enlarged. Mildly increased right  ventricular wall thickness. There is severely elevated pulmonary artery  systolic pressure.  3. Left atrial size was mild to moderately dilated.  4. Right atrial size was mild to moderately dilated.  5. The mitral valve is normal in structure. Mild to moderate mitral valve  regurgitation.  6. Tricuspid valve regurgitation is moderate.  7. The aortic valve is normal in structure. Aortic valve regurgitation is  trivial.   EKG:  EKG is ordered today.  The EKG  ordered today demonstrates NSR with sinus arrhythmia, 60 bpm, RBBB, lateral T wave inversion  Recent Labs: 05/29/2020: Magnesium 2.1 06/17/2020: ALT 24; BUN 40; Creatinine, Ser 3.40; Hemoglobin 9.4; Platelets 108; Potassium 4.2; Sodium 138  Recent Lipid Panel    Component Value Date/Time   CHOL 79 11/26/2018 0504   TRIG 98 11/26/2018 0504   HDL 32 (L) 11/26/2018 0504   CHOLHDL 2.5 11/26/2018 0504   VLDL 20 11/26/2018 0504   LDLCALC 27  11/26/2018 0504    PHYSICAL EXAM:    VS:  BP (!) 110/50 (BP Location: Right Arm, Patient Position: Sitting, Cuff Size: Normal)   Pulse 60   Ht 5\' 8"  (1.727 m)   Wt 145 lb (65.8 kg)   SpO2 94%   BMI 22.05 kg/m   BMI: Body mass index is 22.05 kg/m.  Physical Exam Constitutional:      Appearance: He is well-developed and underweight.     Comments: Frail and chronically ill-appearing  HENT:     Head: Normocephalic and atraumatic.  Eyes:     General:        Right eye: No discharge.        Left eye: No discharge.  Neck:     Vascular: No JVD.  Cardiovascular:     Rate and Rhythm: Normal rate and regular rhythm.     Pulses: No midsystolic click and no opening snap.          Posterior tibial pulses are 2+ on the right side and 2+ on the left side.     Heart sounds: S1 normal and S2 normal. Heart sounds not distant. Murmur heard. High-pitched blowing holosystolic murmur is present with a grade of 1/6 at the apex.  No friction rub.  Pulmonary:     Effort: Pulmonary effort is normal. No respiratory distress.     Breath sounds: Normal breath sounds. No decreased breath sounds, wheezing or rales.  Chest:     Chest wall: No tenderness.  Abdominal:     General: There is no distension.     Palpations: Abdomen is soft.     Tenderness: There is no abdominal tenderness.  Musculoskeletal:     Cervical back: Normal range of motion.     Right lower leg: Edema present.     Left lower leg: Edema present.     Comments: Trace bilateral pretibial edema    Skin:    General: Skin is warm and dry.     Nails: There is no clubbing.  Neurological:     Mental Status: He is alert and oriented to person, place, and time.  Psychiatric:        Speech: Speech normal.        Behavior: Behavior normal.        Thought Content: Thought content normal.        Judgment: Judgment normal.     Wt Readings from Last 3 Encounters:  06/18/20 145 lb (65.8 kg)  06/17/20 145 lb (65.8 kg)  06/06/20 155 lb (70.3 kg)     ASSESSMENT & PLAN:   1. Preoperative cardiac risk stratification: Obtaining the patient's functional status is quite limited given his comorbid conditions.  He does deny any symptoms concerning for angina or decompensated heart failure.  Per revised cardiac index, he calculates out to be a low risk for noncardiac procedure.  That said, given his comorbid conditions I would consider him at least a moderate risk for noncardiac procedure.  Nonetheless, no further cardiac intervention will reduce this risk and this should not delay his procedure given indication.  2. HFrEF: Uncertain etiology at this time.  He appears euvolemic and well compensated.  It does not appear he is currently on any evidence-based therapies.  Without an accurate medication list I cannot comment any further on escalating GDMT.  Evidence-based medical therapy is also is likely to be limited by relative hypotension and intermittent or bradycardic heart rates.  Ideally, his cardiomyopathy would be further evaluated  either noninvasively or invasively.  However, this would delay his needed ERCP.  He does not have any active symptoms of unstable angina or decompensation and therefore this can be evaluated post procedure.  We would also need to ascertain the patient's clinical prognosis prior to potentially performing invasive procedures.  The patient's niece indicates he has previously been told he has approximately 6 months to live.  Further recommendations pending medication review and  will be addressed in follow-up.  3. Atrial flutter: He is maintaining sinus rhythm.  Amiodarone has previously been recommended to be held, reportedly for heart rates in the 60s bpm, which would not be an indication to hold this medication.  That said he does have documented heart rates in the 40s bpm at oncology at the end of July.  He is at high risk for recurrent arrhythmia given his comorbid conditions.  Pending the review of his medication bottles, we may consider resuming amiodarone at 200 mg daily.  CHA2DS2-VASc at least 6 (CHF, HTN, age x2, DM, vascular disease).  Patient and niece feel quite certain he has been taking Eliquis since his hospital discharge despite internal medicine service recommending this be discontinued.  We will await his updated medication list for further recommendations.  Nonetheless, if he is taking Eliquis this should be held for 2 days prior to his procedure and resumed as quickly as possible in a safe manner by the procedure performing team.  If he is not taking Eliquis at this time we will defer restarting this in the periprocedural timeframe with recommendations for this to possibly be resumed post procedurally pending clinical course and labs.  4. CAD involving the native coronary arteries: He denies any symptoms concerning for angina and historically has been on Eliquis in place of aspirin given his underlying recurrent atrial flutter.  Interestingly aspirin and Plavix have been added to his medication list by an outside office on 06/06/2020 for unclear reasons.  Please see detailed documentation below regarding this matter.  We will await review of his medications prior to making any recommendations regarding medical therapy.    5. Medication management: Our visit today was focused primarily on attempting to ascertain what the patient was and was not taking.  As outlined above in the HPI, there is significant confusion regarding his medication list.  We have contacted his  pharmacy to attempt to verify some of his cardiac medications.  We cannot determine when, or even if Plavix was prescribed.  I have done a chart review of epic and care everywhere and cannot find a Plavix prescription.  We have contacted the pharmacy and they have never received or filled a Plavix prescription.  The patient has amlodipine on his medication list though this has not been filled in 2 years.  He has lisinopril/HCTZ on his medication list though the pharmacy has never received a prescription for this, nor do I see where it was prescribed.  It appears all of these medications may have been added to his list when he was seen by the oncology office on 7/23.  It is uncertain where they got this information from.  The patient does not have his medications for review at this time.  I have asked that the niece bring all of the patient's medications to our office on 8/5 for review, reconciliation, and further recommendations.  The patient and niece who is present do feel quite certain he has continued to take Eliquis, despite recommendations at this medicine be discontinued by internal medicine  during his recent hospitalization in 05/2020.  If he is in fact taking this medication it will need to be interrupted 2 days prior to his planned ERCP with resumption being undertaken up by the treatment team as soon as safely felt possible.  As above, we will await review of his pill bottles to provide further recommendations.  6. Anemia/thrombocytopenia: This would need to be monitored closely if anticoagulation is resumed.  Follow-up with PCP and oncology as directed.  7. Neuroendocrine tumor: Followed by oncology.  8. CKD stage IV: Most recent renal function stable and improved.  Follow-up with PCP/nephrology as directed.  9. Pulmonary hypertension: Likely in the setting of underlying pulmonary disease with known COPD.  He appears well compensated.  Plan as above.  10. HTN: Blood pressure is on the soft side  today.  Await medications as above.  11. HLD: LDL of 27.  Uncertain if he is taking a statin or not.  Await medication review as above.   Disposition: F/u with Dr. Rockey Situ or an APP in 2 weeks to address the numerous outstanding issues as outlined above.   Medication Adjustments/Labs and Tests Ordered: Current medicines are reviewed at length with the patient today.  Concerns regarding medicines are outlined above. Medication changes, Labs and Tests ordered today are summarized above and listed in the Patient Instructions accessible in Encounters.   Signed, Christell Faith, PA-C 06/18/2020 3:21 PM     [address] [place], [place] [address] [phone]

## 2020-06-10 DIAGNOSIS — E119 Type 2 diabetes mellitus without complications: Secondary | ICD-10-CM | POA: Diagnosis not present

## 2020-06-10 DIAGNOSIS — D3A8 Other benign neuroendocrine tumors: Secondary | ICD-10-CM | POA: Diagnosis not present

## 2020-06-10 DIAGNOSIS — K838 Other specified diseases of biliary tract: Secondary | ICD-10-CM | POA: Diagnosis not present

## 2020-06-10 DIAGNOSIS — Z794 Long term (current) use of insulin: Secondary | ICD-10-CM | POA: Diagnosis not present

## 2020-06-11 MED FILL — Sodium Chloride IV Soln 0.9%: INTRAVENOUS | Qty: 100 | Status: AC

## 2020-06-11 MED FILL — Vasopressin IV Soln 20 Unit/ML (For IV Infusion): INTRAVENOUS | Qty: 1 | Status: AC

## 2020-06-17 ENCOUNTER — Other Ambulatory Visit: Payer: Self-pay

## 2020-06-17 ENCOUNTER — Inpatient Hospital Stay: Payer: Medicare Other

## 2020-06-17 ENCOUNTER — Inpatient Hospital Stay: Payer: Medicare Other | Admitting: Oncology

## 2020-06-17 ENCOUNTER — Emergency Department
Admission: EM | Admit: 2020-06-17 | Discharge: 2020-06-17 | Disposition: A | Discharge status: Home / Self Care | Payer: Medicare Other | Attending: Emergency Medicine | Admitting: Emergency Medicine

## 2020-06-17 ENCOUNTER — Encounter (INDEPENDENT_AMBULATORY_CARE_PROVIDER_SITE_OTHER): Payer: Self-pay

## 2020-06-17 DIAGNOSIS — R197 Diarrhea, unspecified: Secondary | ICD-10-CM | POA: Diagnosis not present

## 2020-06-17 DIAGNOSIS — Z7982 Long term (current) use of aspirin: Secondary | ICD-10-CM | POA: Diagnosis not present

## 2020-06-17 DIAGNOSIS — I959 Hypotension, unspecified: Secondary | ICD-10-CM | POA: Diagnosis not present

## 2020-06-17 DIAGNOSIS — Z79899 Other long term (current) drug therapy: Secondary | ICD-10-CM | POA: Insufficient documentation

## 2020-06-17 DIAGNOSIS — I2510 Atherosclerotic heart disease of native coronary artery without angina pectoris: Secondary | ICD-10-CM | POA: Insufficient documentation

## 2020-06-17 DIAGNOSIS — I509 Heart failure, unspecified: Secondary | ICD-10-CM | POA: Diagnosis not present

## 2020-06-17 DIAGNOSIS — J449 Chronic obstructive pulmonary disease, unspecified: Secondary | ICD-10-CM | POA: Diagnosis not present

## 2020-06-17 DIAGNOSIS — Z794 Long term (current) use of insulin: Secondary | ICD-10-CM | POA: Diagnosis not present

## 2020-06-17 DIAGNOSIS — I130 Hypertensive heart and chronic kidney disease with heart failure and stage 1 through stage 4 chronic kidney disease, or unspecified chronic kidney disease: Secondary | ICD-10-CM | POA: Diagnosis not present

## 2020-06-17 DIAGNOSIS — I10 Essential (primary) hypertension: Secondary | ICD-10-CM | POA: Diagnosis not present

## 2020-06-17 DIAGNOSIS — R531 Weakness: Secondary | ICD-10-CM | POA: Diagnosis not present

## 2020-06-17 DIAGNOSIS — N183 Chronic kidney disease, stage 3 unspecified: Secondary | ICD-10-CM | POA: Insufficient documentation

## 2020-06-17 DIAGNOSIS — R1013 Epigastric pain: Secondary | ICD-10-CM | POA: Diagnosis not present

## 2020-06-17 DIAGNOSIS — F17210 Nicotine dependence, cigarettes, uncomplicated: Secondary | ICD-10-CM | POA: Insufficient documentation

## 2020-06-17 HISTORY — DX: Malignant (primary) neoplasm, unspecified: C80.1

## 2020-06-17 LAB — COMPREHENSIVE METABOLIC PANEL
ALT: 24 U/L (ref 0–44)
AST: 19 U/L (ref 15–41)
Albumin: 3.3 g/dL — ABNORMAL LOW (ref 3.5–5.0)
Alkaline Phosphatase: 395 U/L — ABNORMAL HIGH (ref 38–126)
Anion gap: 11 (ref 5–15)
BUN: 40 mg/dL — ABNORMAL HIGH (ref 8–23)
CO2: 20 mmol/L — ABNORMAL LOW (ref 22–32)
Calcium: 8.6 mg/dL — ABNORMAL LOW (ref 8.9–10.3)
Chloride: 107 mmol/L (ref 98–111)
Creatinine, Ser: 3.4 mg/dL — ABNORMAL HIGH (ref 0.61–1.24)
GFR calc Af Amer: 19 mL/min — ABNORMAL LOW (ref >60)
GFR calc non Af Amer: 17 mL/min — ABNORMAL LOW (ref >60)
Glucose, Bld: 107 mg/dL — ABNORMAL HIGH (ref 70–99)
Potassium: 4.2 mmol/L (ref 3.5–5.1)
Sodium: 138 mmol/L (ref 135–145)
Total Bilirubin: 2.2 mg/dL — ABNORMAL HIGH (ref 0.3–1.2)
Total Protein: 6.3 g/dL — ABNORMAL LOW (ref 6.5–8.1)

## 2020-06-17 LAB — CBC
HCT: 30 % — ABNORMAL LOW (ref 39.0–52.0)
Hemoglobin: 9.4 g/dL — ABNORMAL LOW (ref 13.0–17.0)
MCH: 32.2 pg (ref 26.0–34.0)
MCHC: 31.3 g/dL (ref 30.0–36.0)
MCV: 102.7 fL — ABNORMAL HIGH (ref 80.0–100.0)
Platelets: 108 10*3/uL — ABNORMAL LOW (ref 150–400)
RBC: 2.92 MIL/uL — ABNORMAL LOW (ref 4.22–5.81)
RDW: 17.6 % — ABNORMAL HIGH (ref 11.5–15.5)
WBC: 7.9 10*3/uL (ref 4.0–10.5)
nRBC: 0 % (ref 0.0–0.2)

## 2020-06-17 LAB — LIPASE, BLOOD: Lipase: 35 U/L (ref 11–51)

## 2020-06-17 MED ORDER — ONDANSETRON HCL 4 MG/2ML IJ SOLN
4.0000 mg | Freq: Once | INTRAMUSCULAR | Status: AC
  Administered 2020-06-17: 4 mg via INTRAVENOUS
  Filled 2020-06-17: qty 2

## 2020-06-17 MED ORDER — SODIUM CHLORIDE 0.9 % IV BOLUS
500.0000 mL | Freq: Once | INTRAVENOUS | Status: AC
  Administered 2020-06-17: 500 mL via INTRAVENOUS

## 2020-06-17 MED ORDER — MORPHINE SULFATE (PF) 2 MG/ML IV SOLN
2.0000 mg | Freq: Once | INTRAVENOUS | Status: AC
  Administered 2020-06-17: 2 mg via INTRAVENOUS
  Filled 2020-06-17: qty 1

## 2020-06-17 MED ORDER — SODIUM CHLORIDE 0.9% FLUSH: 3.0000 mL | Freq: Once | INTRAVENOUS | Status: DC

## 2020-06-17 NOTE — ED Notes (Signed)
Pt states that he had a 'virus' earlier where he had diarrhea. Patient denies nv and fevers.

## 2020-06-17 NOTE — Progress Notes (Deleted)
Hematology/Oncology Consult note [facility]  Telephone:([phone] [phone]  Patient Care Team: [name], MD as PCP - General (Cardiology) [name], MD as PCP - Cardiology (Cardiology) [name], MD as Consulting Physician (Cardiology) Clent Jacks, RN as Registered Nurse   Name of the patient: [name]  [id number]  22-May-1946   Date of visit: 06/17/20  Diagnosis- 1.  Metastatic small bowel carcinoid 2.  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 In January 2019 patient was also noted to have 2.2 x 1.7 cm mass in the pancreatic head without hypermetabolism on dotatate PET scan. This was followed by EUS and FNA of the pancreatic head mass which showed:DIAGNOSIS:  A. PANCREATIC HEAD MASS; ENDOSCOPIC ULTRASOUND GUIDED FNA:  - POSITIVE FOR MALIGNANCY.  - NON-DUCTAL NEOPLASM PRESENT, SEE COMMENT.Based on cytologic features differential diagnosis includes neuroendocrine tumor and asthenic cell carcinoma. Case sent to [facility] for second opinion who also favored the diagnosis of neuroendocrine tumor noting that acinar cell carcinoma cannot be excluded  Patient was referred to Dr. Mariah Milling from [facility] and back in December 2020 scan showed stable size of the mass and surgical intervention was not recommended. 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  He also underwent ERCP which showed severe localized biliary stricture and underwent sphincterotomy with balloon sphincteroplasty and brushings again showed inconclusive  diagnosis-neuroendocrine tumor versus acinar cell carcinoma or less likely adenocarcinoma.  Patient was also seen by Dr. Leamon Arnt at [facility] in June 2021 and since patient was not deemed to be a surgical candidate at that time trial of octreotide was recommended.  Shortly following that patient was admitted to the hospital with symptoms of acute cholangitisAnd underwent a repeat ERCP by Dr. Allen Norris during which the prior occluded CBD stent was taken out and a plastic stent was placed into the common bile duct.  Hospital course was complicated by septic shock requiring intubation and pressors but patient eventually recovered and [facility]  Scans at that time showed worsening tumor with enlarging adrenal involvement on the left side increasing soft tissue and ductal dilatation.  Interval history-patient is currently frail and slowly recovering in his rehab.  Reports that his appetite is good and since his hospital discharge he has gained 9 pounds although he does have bilateral lower extremity swelling.  Reports occasional abdominal pain And constipation  ECOG PS- 3 Pain scale- 0   Review of systems- Review of Systems  Constitutional: Positive for malaise/fatigue. Negative for chills, fever and weight loss.  HENT: Negative for congestion, ear discharge and nosebleeds.   Eyes: Negative for blurred vision.  Respiratory: Negative for cough, hemoptysis, sputum production, shortness of breath and wheezing.   Cardiovascular: Negative for chest pain, palpitations, orthopnea and claudication.  Gastrointestinal: Positive for abdominal pain and constipation. Negative for blood in stool, diarrhea, heartburn, melena, nausea and vomiting.  Genitourinary: Negative for dysuria, flank pain, frequency, hematuria and urgency.  Musculoskeletal: Negative for back pain, joint pain and myalgias.  Skin: Negative for rash.  Neurological: Negative for dizziness, tingling, focal weakness, seizures,  weakness and headaches.  Endo/Heme/Allergies: Does not bruise/bleed  easily.  Psychiatric/Behavioral: Negative for depression and suicidal ideas. The patient does not have insomnia.       Allergies  Allergen Reactions  . Levaquin [Levofloxacin In D5w] Swelling and Rash     Past Medical History:  Diagnosis Date  . Aortic atherosclerosis ([place])    As seen on 04/2019 CT  . Atrial flutter ([place])    a. s/p ablation 2017/2018 and DCCV 2015/2016/2019 b. 2017 echo EF 60-65%, no RWMA, mild MR,  . CAD (coronary artery disease)    3v CAD 04/2019 CT  . Carcinoid tumor    metastatic  . CKD (chronic kidney disease), stage III   . COPD (chronic obstructive pulmonary disease) (H[facility])    PM oxygen  . Current occasional smoker    1-2 cigarettes / week  . Diabetes mellitus type 2, insulin dependent ([place])   . Heart failure with preserved ejection fraction ([place])    2017 echo EF 60-65%, no RWMA, mild MR,    . Hyperlipidemia   . Hypertension   . Pancreatitis   . Polycythemia      Past Surgical History:  Procedure Laterality Date  . A-FLUTTER ABLATION N/A 10/14/2017   Procedure: A-FLUTTER ABLATION;  Surgeon: Constance Haw, MD;  Location: [place] [facility];  Service: Cardiovascular;  Laterality: N/A;  . CARDIOVERSION N/A 09/08/2017   Procedure: CARDIOVERSION;  Surgeon: [name], MD;  Location: [facility];  Service: Cardiovascular;  Laterality: N/A;  . CARDIOVERSION N/A 03/22/2018   Procedure: CARDIOVERSION;  Surgeon: [name], MD;  Location: [facility];  Service: Cardiovascular;  Laterality: N/A;  . COLONOSCOPY    . COLONOSCOPY WITH PROPOFOL N/A 10/31/2019   Procedure: COLONOSCOPY WITH PROPOFOL;  Surgeon: Lin Landsman, MD;  Location: [facility];  Service: Gastroenterology;  Laterality: N/A;  . ELECTROPHYSIOLOGIC STUDY N/A 10/27/2016   Procedure: A-Flutter Ablation;  Surgeon: Deboraha Sprang, MD;  Location: [place] [facility];  Service: Cardiovascular;  Laterality: N/A;    . ENDOSCOPIC RETROGRADE CHOLANGIOPANCREATOGRAPHY (ERCP) WITH PROPOFOL N/A 05/21/2020   Procedure: ENDOSCOPIC RETROGRADE CHOLANGIOPANCREATOGRAPHY (ERCP) WITH PROPOFOL;  Surgeon: [name], MD;  Location: [facility];  Service: Endoscopy;  Laterality: N/A;  . EUS N/A 12/22/2017   Procedure: FULL UPPER ENDOSCOPIC ULTRASOUND (EUS) RADIAL;  Surgeon: Reita Cliche, MD;  Location: [facility];  Service: Gastroenterology;  Laterality: N/A;  . TEE WITH CARDIOVERSION  12/16  . TEMPORARY DIALYSIS CATHETER N/A 02/08/2020   Procedure: TEMPORARY DIALYSIS CATHETER;  Surgeon: Algernon Huxley, MD;  Location: [place] [facility];  Service: Cardiovascular;  Laterality: N/A;    Social History   Socioeconomic History  . Marital status: Widowed    Spouse name: Not on file  . Number of children: Not on file  . Years of education: Not on file  . Highest education level: Not on file  Occupational History  . Not on file  Tobacco Use  . Smoking status: Former Smoker    Years: 45.00    Types: Cigarettes    Quit date: 02/02/2020    Years since quitting: 0.3  . Smokeless tobacco: Never Used  Vaping Use  . Vaping Use: Never used  Substance and Sexual Activity  . Alcohol use: Not Currently  . Drug use: No  . Sexual activity: Not Currently  Other Topics Concern  . Not on file  Social History Narrative  . Not on file   Social Determinants of Health   Financial Resource Strain:   . Difficulty of Paying Living  Expenses:   Food Insecurity:   . Worried About Charity fundraiser in the Last Year:   . Arboriculturist in the Last Year:   Transportation Needs:   . Film/video editor (Medical):   Marland Kitchen Lack of Transportation (Non-Medical):   Physical Activity:   . Days of Exercise per Week:   . Minutes of Exercise per Session:   Stress:   . Feeling of Stress :   Social Connections:   . Frequency of Communication with Friends and Family:   . Frequency of Social Gatherings with Friends and Family:    . Attends Religious Services:   . Active Member of Clubs or Organizations:   . Attends Archivist Meetings:   Marland Kitchen Marital Status:   Intimate Partner Violence:   . Fear of Current or Ex-Partner:   . Emotionally Abused:   Marland Kitchen Physically Abused:   . Sexually Abused:     Family History  Problem Relation Age of Onset  . Hypertension Father      Current Outpatient Medications:  .  acetaminophen (TYLENOL) 500 MG tablet, Take 500-1,000 mg by mouth every 6 (six) hours as needed for moderate pain or headache. , Disp: , Rfl:  .  Amino Acids-Protein Hydrolys (PRO-STAT 101 PO), Take 30 mLs by mouth daily., Disp: , Rfl:  .  amLODipine (NORVASC) 2.5 MG tablet, Take 2.5 mg by mouth daily., Disp: , Rfl:  .  aspirin EC 81 MG tablet, Take 81 mg by mouth daily. Swallow whole., Disp: , Rfl:  .  atorvastatin (LIPITOR) 40 MG tablet, Take 40 mg by mouth daily., Disp: , Rfl:  .  clopidogrel (PLAVIX) 75 MG tablet, Take 75 mg by mouth daily., Disp: , Rfl:  .  docusate sodium (COLACE) 100 MG capsule, Take 100 mg by mouth daily as needed for mild constipation., Disp: , Rfl:  .  feeding supplement, GLUCERNA SHAKE, (GLUCERNA SHAKE) LIQD, Take 237 mLs by mouth 3 (three) times daily between meals., Disp: 237 mL, Rfl: 0 .  Infant Care Products [facility] EX), Apply 1 application topically as needed (with every incontence)., Disp: , Rfl:  .  insulin glargine (LANTUS) 100 UNIT/ML injection, Inject 0.12 mLs (12 Units total) into the skin at bedtime. (Patient taking differently: Inject 22 Units into the skin at bedtime. ), Disp: 10 mL, Rfl: 11 .  lisinopril-hydrochlorothiazide (ZESTORETIC) 10-12.5 MG tablet, Take 1 tablet by mouth daily., Disp: , Rfl:  .  meclizine (ANTIVERT) 25 MG tablet, Take 25 mg by mouth 2 (two) times daily., Disp: , Rfl:  .  Multiple Vitamin (MULTIVITAMIN ADULT PO), Take 1 tablet by mouth daily., Disp: , Rfl:  .  ondansetron (ZOFRAN) 4 MG tablet, Take 4 mg by mouth every 6 (six) hours as  needed for nausea or vomiting., Disp: , Rfl:  .  oxyCODONE (OXY IR/ROXICODONE) 5 MG immediate release tablet, Take 1 tablet (5 mg total) by mouth every 12 (twelve) hours as needed for severe pain., Disp: 60 tablet, Rfl: 0 .  vitamin C (ASCORBIC ACID) 250 MG tablet, Take 250 mg by mouth daily., Disp: , Rfl:   Physical exam:  There were no vitals filed for this visit. Physical Exam Constitutional:      Comments: Sitting in a wheelchair.  Appears frail and tremulous  Eyes:     Comments: Sclerae icteric  Cardiovascular:     Rate and Rhythm: Normal rate and regular rhythm.     Heart sounds: Normal heart sounds.  Pulmonary:  Effort: Pulmonary effort is normal.     Breath sounds: Normal breath sounds.  Abdominal:     General: Bowel sounds are normal. There is no distension.     Palpations: Abdomen is soft.     Tenderness: There is no abdominal tenderness.  Musculoskeletal:     Right lower leg: Edema present.     Left lower leg: Edema present.  Skin:    General: Skin is warm and dry.  Neurological:     Mental Status: He is alert and oriented to person, place, and time.      CMP Latest Ref Rng & Units 06/06/2020  Glucose 70 - 99 mg/dL 49(L)  BUN 8 - 23 mg/dL 60(H)  Creatinine 0.61 - 1.24 mg/dL 3.39(H)  Sodium 135 - 145 mmol/L 138  Potassium 3.5 - 5.1 mmol/L 4.9  Chloride 98 - 111 mmol/L 105  CO2 22 - 32 mmol/L 24  Calcium 8.9 - 10.3 mg/dL 8.7(L)  Total Protein 6.5 - 8.1 g/dL 6.2(L)  Total Bilirubin 0.3 - 1.2 mg/dL 3.1(H)  Alkaline Phos 38 - 126 U/L 634(H)  AST 15 - 41 U/L 38  ALT 0 - 44 U/L 66(H)   CBC Latest Ref Rng & Units 06/06/2020  WBC 4.0 - 10.5 K/uL 7.3  Hemoglobin 13.0 - 17.0 g/dL 8.6(L)  Hematocrit 39 - 52 % 28.0(L)  Platelets 150 - 400 K/uL 249    No images are attached to the encounter.  CT ABDOMEN PELVIS WO CONTRAST  Result Date: 05/19/2020 CLINICAL DATA:  Abdominal pain with nausea in the setting of pancreatic neuroendocrine tumor EXAM: CT ABDOMEN AND  PELVIS WITHOUT CONTRAST TECHNIQUE: Multidetector CT imaging of the abdomen and pelvis was performed following the standard protocol without IV contrast. COMPARISON:  February 08, 2020 FINDINGS: Lower chest: 4 mm LEFT lower lobe pulmonary nodule along the pleural surface not present on March of 2021. No consolidation. No pleural effusion. Hepatobiliary: Post placement of biliary stent. Despite presence of stent the common bile duct is more dilated than on the previous exam measuring up to 19 mm. Previously approximately 13 mm. Masslike areas in the pancreatic head are similar. There is been interval development of low attenuation in the pancreatic neck that was not present previously approximately 15 mm (image 28, series 2) Also further dilation of the distal pancreatic duct is suggested with increase in the soft tissue density in the mid body of the pancreas. This area measuring approximately 3.9 cm greatest thickness at the site of ductal transition where as on the prior study it measured approximately 3.5 cm. Extending from the upper margin of the dilated duct at the site of ductal transition is a low-attenuation ovoid area measuring water density at 5 by 3.1 cm, abutting the lesser curvature. (Image 19, series 2) previously measuring approximately 3.9 x 3.0 cm in this location peripancreatic stranding is mildly increased when compared to the prior study. Soft tissue lesion associated with LEFT adrenal contiguous with with increase in size (image 21, series 2) 2.6 x 2.7 cm, previously 2.5 x 2.4 cm. Pancreas: As above Spleen: Spleen grossly normal on noncontrast imaging. Adrenals/Urinary Tract: Adrenal gland on the RIGHT is normal. LEFT adrenal mass contiguous with the pancreas enlarging as described. Mild perinephric stranding and in general anterior pararenal stranding on the LEFT. No hydronephrosis. Urinary bladder is unremarkable. Stomach/Bowel: Stomach with mild to moderate distension. Mild dilation of small  bowel loops becomes moderately dilated in the mid small bowel at the level of the jejunum. There  is peristaltic activity versus is transition in the distal jejunum at 3.6 cm caliber proximal to the site of narrowing with decompression of more distal small bowel loops. Colon is largely stool filled. Stool like material is present in the terminal ileum. Vascular/Lymphatic: Calcified atheromatous plaque of the abdominal aorta. No aneurysmal dilation. Adenopathy in the mesentery, largest measuring 11 mm is unchanged since the previous exam. Calcified mesenteric soft tissue (image 44, series 2) approximately 2.7 x 1.8 cm enlarged in short axis though difficult to measure given potential changes in mesenteric position over time. Reproductive: No adenopathy in the pelvis. Other: No free air.  No ascites. Musculoskeletal: No acute musculoskeletal process. Spinal degenerative changes. IMPRESSION: 1. 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 2. Worsening of biliary duct distension above the level of the stent since previous imaging from March of 2021 3. Increasing distension of small bowel in the proximal small bowel with site of transition in the distal jejunum. Is unclear whether this represents vigorous peristaltic activity following contrast administration or developing small bowel obstruction or ileus. Subsequent follow-up based on symptoms with radiographs may be helpful. 4. Increasing soft tissue and increasing ductal dilation with at least 2 sites of transition in this patient with known neuroendocrine tumor suspicious for worsening tumor with enlarging adrenal involvement on the LEFT as discussed, at the site of previous more benign appearing LEFT adrenal neoplasm now inseparable from stranding and soft tissue in the region of the pancreatic tail. 5. Given appearance it is possible that direct extension of stranding from the  pancreas towards the LEFT adrenal may relate to inflammation. Attention on follow-up. 6. Extensive portosystemic collaterals in the upper abdomen compatible with splenic venous compromise, not well assessed but this secondary finding is strongly suggestive of splenic venous compromise due to worsening tumor in the region of the pancreas. Electronically Signed   By: Zetta Bills M.D.   On: 05/19/2020 12:00   DG Chest Port 1 View  Result Date: 05/21/2020 CLINICAL DATA:  Central line placement. EXAM: PORTABLE CHEST 1 VIEW COMPARISON:  Earlier this day. FINDINGS: Tip of the left internal jugular central venous catheter projects over the mid SVC. No pneumothorax. Endotracheal tube tip remains at the thoracic inlet. Normal heart size with unchanged mediastinal contours. Vascular congestion with chronic interstitial coarsening. Slight increase in left lung base atelectasis. No large pleural effusion. IMPRESSION: 1. Tip of the left internal jugular central venous catheter projects over the mid SVC. No pneumothorax. 2. Slight increase in left lung base atelectasis. Otherwise unchanged appearance of the chest. Electronically Signed   By: Keith Rake M.D.   On: 05/21/2020 20:53   DG Chest Port 1 View  Result Date: 05/21/2020 CLINICAL DATA:  Atrial fibrillation, pancreatitis, intubation EXAM: PORTABLE CHEST 1 VIEW COMPARISON:  Radiograph 02/08/2020 FINDINGS: Endotracheal tube 4.5 cm from the carina. Telemetry leads overlie the chest. Chronically coarsened interstitial opacities are present throughout the lungs, similar to the comparison. Hypoventilatory changes likely increased prominence of these findings. Mild central vascular congestion without convincing features of edema. No pneumothorax or effusion. The aorta is calcified. The remaining cardiomediastinal contours are unremarkable. No acute osseous or soft tissue abnormality. Degenerative changes are present in the imaged spine and shoulders. IMPRESSION: 1.  Endotracheal tube 4.5 cm from the carina. 2. Chronically coarsened interstitial opacities, similar to the comparison. 3. Mild central vascular congestion without convincing features of edema. 4. Atelectasis. Electronically Signed  By: Lovena Le M.D.   On: 05/21/2020 19:09   DG C-Arm 1-60 Min-No Report  Result Date: 05/21/2020 Fluoroscopy was utilized by the requesting physician.  No radiographic interpretation.   ECHOCARDIOGRAM COMPLETE  Result Date: 05/22/2020    ECHOCARDIOGRAM REPORT   Patient Name:   KONSTANTINOS CORDOBA [facility] Date of Exam: 05/22/2020 Medical Rec #:  [id number]             Height:       67.0 in Accession #:    [id number]            Weight:       140.9 lb Date of Birth:  1946-03-17             BSA:          1.742 m Patient Age:    59 years              BP:           97/68 mmHg Patient Gender: M                     HR:           62 bpm. Exam Location:  [facility] Procedure: 2D Echo, Cardiac Doppler and Color Doppler Indications:     Shock  History:         Patient has prior history of Echocardiogram examinations, most                  recent 09/23/2016. COPD, Arrythmias:Atrial Flutter; Risk                  Factors:Hypertension, Diabetes and Dyslipidemia.  Sonographer:     Sherrie Sport RDCS (AE) Referring Phys:  [id number] Bradly Bienenstock Diagnosing Phys: Serafina Royals MD  Sonographer Comments: No parasternal window, no apical window and echo performed with patient supine and on artificial respirator. The subcostal was the only view obtainable. and Image acquisition challenging due to COPD. IMPRESSIONS  1. Left ventricular ejection fraction, by estimation, is 35 to 40%. The left ventricle has moderately decreased function. The left ventricle demonstrates regional wall motion abnormalities (see scoring diagram/findings for description). Left ventricular  diastolic parameters were normal.  2. Right ventricular systolic function is moderately reduced. The right ventricular size is moderately enlarged. Mildly  increased right ventricular wall thickness. There is severely elevated pulmonary artery systolic pressure.  3. Left atrial size was mild to moderately dilated.  4. Right atrial size was mild to moderately dilated.  5. The mitral valve is normal in structure. Mild to moderate mitral valve regurgitation.  6. Tricuspid valve regurgitation is moderate.  7. The aortic valve is normal in structure. Aortic valve regurgitation is trivial. FINDINGS  Left Ventricle: Left ventricular ejection fraction, by estimation, is 35 to 40%. The left ventricle has moderately decreased function. The left ventricle demonstrates regional wall motion abnormalities. Moderate hypokinesis of the left ventricular, entire inferoseptal wall. The left ventricular internal cavity size was normal in size. There is no left ventricular hypertrophy. Left ventricular diastolic parameters were normal. Right Ventricle: The right ventricular size is moderately enlarged. Mildly increased right ventricular wall thickness. Right ventricular systolic function is moderately reduced. There is severely elevated pulmonary artery systolic pressure. The tricuspid  regurgitant velocity is 3.80 m/s, and with an assumed right atrial pressure of 10 mmHg, the estimated right ventricular systolic pressure is 12.4 mmHg. Left Atrium: Left atrial size was mild to moderately dilated. Right Atrium: Right  atrial size was mild to moderately dilated. Pericardium: There is no evidence of pericardial effusion. Mitral Valve: The mitral valve is normal in structure. Mild to moderate mitral valve regurgitation. Tricuspid Valve: The tricuspid valve is normal in structure. Tricuspid valve regurgitation is moderate. Aortic Valve: The aortic valve is normal in structure. Aortic valve regurgitation is trivial. Pulmonic Valve: The pulmonic valve was normal in structure. Pulmonic valve regurgitation is mild. Aorta: The aortic root and ascending aorta are structurally normal, with no evidence  of dilitation. IAS/Shunts: No atrial level shunt detected by color flow Doppler.  LEFT VENTRICLE PLAX 2D LVIDd:         4.23 cm LVIDs:         3.60 cm LV PW:         1.36 cm LV IVS:        0.91 cm  LEFT ATRIUM         Index LA diam:    5.10 cm 2.93 cm/m  PULMONIC VALVE PV Vmax:        0.83 m/s PV Peak grad:   2.7 mmHg RVOT Peak grad: 2 mmHg  TRICUSPID VALVE TR Peak grad:   57.8 mmHg TR Vmax:        380.00 cm/s Serafina Royals MD Electronically signed by Serafina Royals MD Signature Date/Time: 05/22/2020/4:43:08 PM    Final      Assessment and plan- Patient is a 74 y.o. male with known neuroendocrine tumor of the mesentery and pancreatic head mass (neuroendocrine versus acinar cell carcinoma).  This is a post hospital discharge follow-up  1.  With regards to pancreatic head mass which is possibly a carcinoid along with carcinoid tumor involving the mesentery; most recent scans have shown tumor progression.  He is not a candidate for any surgical intervention.  He is currently frail with a performance status of 3.  Family would like to see if the carcinoid shots can help to control this.  He did receive his first shot in the hospital and will be due for his second shot on 06/17/2020 and he will be seen by covering NP with CBC with differential and CMP.  I will see him back when he will be due for his third octreotide shot with same labs.  2.  Patient had a significant complicated hospital course due to septic shock from acute cholangitis requiring intubation and pressor support.  He has made a remarkable recovery from all of that. His total bilirubin is now down to 3.31 which is close to his baseline since March and creatinine has also improved to 3.3.  Continue to monitor  3.  Patient has a plastic stent in place and we will get in touch with [facility] to see when this needs to be replaced with another metallic stent.  Patient would like to get his ERCP if need be at [facility].  He is significantly  deconditioned and frail at this time   Total face to face encounter time for this patient visit was 30 min.     Visit Diagnosis No diagnosis found.   Dr. Randa Evens, MD, MPH [facility] [phone] 06/17/2020 9:00 AM

## 2020-06-17 NOTE — ED Triage Notes (Signed)
Pt c/o increased generalized weakness with abd pain, states he was discharged home staying with a friend for another week or so until home health comes out next Monday, pt c/o uncontrolled abd pain due to stomach CA.

## 2020-06-17 NOTE — ED Provider Notes (Signed)
[facility] Emergency Department Provider Note   ____________________________________________    I have reviewed the triage vital signs and the nursing notes.   HISTORY  Chief Complaint Weakness     HPI [name] is a 74 y.o. 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  Patient reports this morning he had moderate to severe upper abdominal pain, he did take pain medication and is been feeling much better.  She also had significant diarrhea this morning this is also improved.  Denies fevers or chills.  Does have oncology and GI follow-up arranged.  [facility].  Currently states he is doing much better than he was this morning   Past Medical History:  Diagnosis Date  . Aortic atherosclerosis ([place])    As seen on 04/2019 CT  . Atrial flutter ([place])    a. s/p ablation 2017/2018 and DCCV 2015/2016/2019 b. 2017 echo EF 60-65%, no RWMA, mild MR,  . CAD (coronary artery disease)    3v CAD 04/2019 CT  . Cancer ([place])   . Carcinoid tumor    metastatic  . CKD (chronic kidney disease), stage III   . COPD (chronic obstructive pulmonary disease) (HCC)    PM oxygen  . Current occasional smoker    1-2 cigarettes / week  . Diabetes mellitus type 2, insulin dependent ([place])   . Heart failure with preserved ejection fraction ([place])    2017 echo EF 60-65%, no RWMA, mild MR,    . Hyperlipidemia   . Hypertension   . Pancreatitis   . Polycythemia     Patient Active Problem List   Diagnosis Date Noted  . Malignant carcinoid tumor of small intestine ([place]) 06/06/2020  . Sepsis ([place]) 05/21/2020  . Bacteremia 05/21/2020  . Malignant neoplasm of pancreas ([place])   . Acute cholangitis   . Abdominal pain 05/19/2020  . Acute kidney injury superimposed on chronic kidney disease ([place]) 05/19/2020  .  Transaminitis 05/19/2020  . Protein-calorie malnutrition, severe 05/19/2020  . Neoplasm related pain   . Primary pancreatic neuroendocrine tumor   . 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 Aortic atherosclerosis ([place]) 03/21/2020  . Chronic respiratory failure with hypoxia ([place]) 03/21/2020  . Thrombocytopenia, unspecified ([place]) 03/21/2020  . Coronary artery disease of native artery of native heart with stable angina pectoris ([place]) 03/21/2020  . Secondary hyperparathyroidism of renal origin ([place]) 08/20/2019  . Stage 3b chronic kidney disease 08/20/2019  . Elevated serum creatinine 07/03/2019  . Acute pancreatitis 11/25/2018  . S/P ablation of atrial flutter 10/15/2017  . Atrial flutter ([place]) 10/14/2017  . Leukocytosis 11/19/2016  . Erythrocytosis 11/19/2016  . Diabetes type 2, uncontrolled ([place]) 09/01/2015  . 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 CAD (coronary artery disease) 09/01/2015  . Left main coronary artery disease 09/01/2015  . Chronic obstructive pulmonary disease ([place]) 01/02/2015  . Encounter for screening colonoscopy 01/02/2015  . Typical atrial flutter ([place]) 11/27/2014  . Smoking history 11/27/2014  . Emphysema of lung ([place]) 11/27/2014  . Pneumonia, organism unspecified([place]) 11/27/2014  . Essential hypertension 11/27/2014  . Adrenal nodule ([place]) 06/11/2014  . B12 deficiency 06/11/2014  . Hypertriglyceridemia 06/11/2014  . Microalbuminuria 06/11/2014  . Type 2 diabetes mellitus ([place]) 06/06/2014    Past Surgical History:  Procedure Laterality Date  . A-FLUTTER ABLATION N/A 10/14/2017   Procedure: A-FLUTTER ABLATION;  Surgeon: Constance Haw, MD;  Location: [place] [facility];  Service: Cardiovascular;  Laterality: N/A;  . CARDIOVERSION N/A 09/08/2017   Procedure: CARDIOVERSION;  Surgeon: [name], MD;  Location: [facility] ORS;  Service: Cardiovascular;  Laterality: N/A;  . CARDIOVERSION N/A 03/22/2018   Procedure:  CARDIOVERSION;  Surgeon: [name], MD;  Location: [facility] ORS;  Service: Cardiovascular;  Laterality: N/A;  . COLONOSCOPY    . COLONOSCOPY WITH PROPOFOL N/A 10/31/2019   Procedure: COLONOSCOPY WITH PROPOFOL;  Surgeon: Lin Landsman, MD;  Location: [facility] ENDOSCOPY;  Service: Gastroenterology;  Laterality: N/A;  . ELECTROPHYSIOLOGIC STUDY N/A 10/27/2016   Procedure: A-Flutter Ablation;  Surgeon: Deboraha Sprang, MD;  Location: [place] [facility];  Service: Cardiovascular;  Laterality: N/A;  . ENDOSCOPIC RETROGRADE CHOLANGIOPANCREATOGRAPHY (ERCP) WITH PROPOFOL N/A 05/21/2020   Procedure: ENDOSCOPIC RETROGRADE CHOLANGIOPANCREATOGRAPHY (ERCP) WITH PROPOFOL;  Surgeon: [name], MD;  Location: [facility] ENDOSCOPY;  Service: Endoscopy;  Laterality: N/A;  . EUS N/A 12/22/2017   Procedure: FULL UPPER ENDOSCOPIC ULTRASOUND (EUS) RADIAL;  Surgeon: Reita Cliche, MD;  Location: [facility] ENDOSCOPY;  Service: Gastroenterology;  Laterality: N/A;  . TEE WITH CARDIOVERSION  12/16  . TEMPORARY DIALYSIS CATHETER N/A 02/08/2020   Procedure: TEMPORARY DIALYSIS CATHETER;  Surgeon: Algernon Huxley, MD;  Location: [place] [facility];  Service: Cardiovascular;  Laterality: N/A;    Prior to Admission medications   Medication Sig Start Date End Date Taking? Authorizing Provider  acetaminophen (TYLENOL) 500 MG tablet Take 500-1,000 mg by mouth every 6 (six) hours as needed for moderate pain or headache.     [provider]  Amino Acids-Protein Hydrolys (PRO-STAT 101 PO) Take 30 mLs by mouth daily.    [provider]  amLODipine (NORVASC) 2.5 MG tablet Take 2.5 mg by mouth daily.    [provider]  aspirin EC 81 MG tablet Take 81 mg by mouth daily. Swallow whole.    [provider]  atorvastatin (LIPITOR) 40 MG tablet Take 40 mg by mouth daily.    [provider]  clopidogrel (PLAVIX) 75 MG tablet Take 75 mg by mouth daily.    [provider]  docusate sodium  (COLACE) 100 MG capsule Take 100 mg by mouth daily as needed for mild constipation.    [provider]  feeding supplement, GLUCERNA SHAKE, (GLUCERNA SHAKE) LIQD Take 237 mLs by mouth 3 (three) times daily between meals. 05/29/20   Fritzi Mandes, MD  Infant Care Products [facility] EX) Apply 1 application topically as needed (with every incontence).    [provider]  insulin glargine (LANTUS) 100 UNIT/ML injection Inject 0.12 mLs (12 Units total) into the skin at bedtime. Patient taking differently: Inject 22 Units into the skin at bedtime.  05/29/20   Fritzi Mandes, MD  lisinopril-hydrochlorothiazide (ZESTORETIC) 10-12.5 MG tablet Take 1 tablet by mouth daily.    [provider]  meclizine (ANTIVERT) 25 MG tablet Take 25 mg by mouth 2 (two) times daily.    [provider]  Multiple Vitamin (MULTIVITAMIN ADULT PO) Take 1 tablet by mouth daily.    [provider]  ondansetron (ZOFRAN) 4 MG tablet Take 4 mg by mouth every 6 (six) hours as needed for nausea or vomiting.    [provider]  oxyCODONE (OXY IR/ROXICODONE) 5 MG immediate release tablet Take 1 tablet (5 mg total) by mouth every 12 (twelve) hours as needed for severe pain. 06/06/20   Verlon Au, NP  vitamin C (  ASCORBIC ACID) 250 MG tablet Take 250 mg by mouth daily.    [provider]     Allergies Levaquin [levofloxacin in d5w]  Family History  Problem Relation Age of Onset  . Hypertension Father     Social History Social History   Tobacco Use  . Smoking status: Former Smoker    Years: 45.00    Types: Cigarettes    Quit date: 02/02/2020    Years since quitting: 0.3  . Smokeless tobacco: Never Used  Vaping Use  . Vaping Use: Never used  Substance Use Topics  . Alcohol use: Not Currently  . Drug use: No    Review of Systems  Constitutional: No fever/chills Eyes: No visual changes.  ENT: No sore throat. Cardiovascular: Denies chest pain. Respiratory:  Denies shortness of breath. Gastrointestinal: As above.   Genitourinary: Negative for dysuria. Musculoskeletal: Negative for back pain. Skin: Negative for rash. Neurological: Negative for headaches   ____________________________________________   PHYSICAL EXAM:  VITAL SIGNS: ED Triage Vitals  Enc Vitals Group     BP 06/17/20 1024 (!) 93/58     Pulse Rate 06/17/20 1024 69     Resp 06/17/20 1024 17     Temp 06/17/20 1024 98.2 F (36.8 C)     Temp Source 06/17/20 1024 Oral     SpO2 06/17/20 1024 100 %     Weight 06/17/20 1026 65.8 kg (145 lb)     Height 06/17/20 1026 1.727 m (5' 8" )     Head Circumference --      Peak Flow --      Pain Score 06/17/20 1025 9     Pain Loc --      Pain Edu? --      Excl. in [place]? --     Constitutional: Alert and oriented.   Nose: No congestion/rhinnorhea. Mouth/Throat: Mucous membranes are moist.    Cardiovascular: Normal rate, regular rhythm. Grossly normal heart sounds.  Good peripheral circulation. Respiratory: Normal respiratory effort.  No retractions. Gastrointestinal: Soft and nontender. No distention.  No CVA tenderness.  Reassuring exam Musculoskeletal: No lower extremity tenderness nor edema.  Warm and well perfused Neurologic:  Normal speech and language. No gross focal neurologic deficits are appreciated.  Skin:  Skin is warm, dry and intact. No rash noted. Psychiatric: Mood and affect are normal. Speech and behavior are normal.  ____________________________________________   LABS (all labs ordered are listed, but only abnormal results are displayed)  Labs Reviewed  COMPREHENSIVE METABOLIC PANEL - Abnormal; Notable for the following components:      Result Value   CO2 20 (*)    Glucose, Bld 107 (*)    BUN 40 (*)    Creatinine, Ser 3.40 (*)    Calcium 8.6 (*)    Total Protein 6.3 (*)    Albumin 3.3 (*)    Alkaline Phosphatase 395 (*)    Total Bilirubin 2.2 (*)    GFR calc non Af Amer 17 (*)    GFR calc Af Amer 19 (*)     All other components within normal limits  CBC - Abnormal; Notable for the following components:   RBC 2.92 (*)    Hemoglobin 9.4 (*)    HCT 30.0 (*)    MCV 102.7 (*)    RDW 17.6 (*)    Platelets 108 (*)    All other components within normal limits  LIPASE, BLOOD  URINALYSIS, COMPLETE (UACMP) WITH MICROSCOPIC   ____________________________________________  EKG ED ECG REPORT  I, Lavonia Drafts, the attending physician, personally viewed and interpreted this ECG.  Date: 06/17/2020  Rhythm: normal sinus rhythm QRS Axis: normal Intervals: Abnormal ST/T Wave abnormalities: normal Narrative Interpretation: no evidence of acute ischemia   ____________________________________________  RADIOLOGY  None ____________________________________________   PROCEDURES  Procedure(s) performed: No  Procedures   Critical Care performed: No ____________________________________________   INITIAL IMPRESSION / ASSESSMENT AND PLAN / ED COURSE  Pertinent labs & imaging results that were available during my care of the patient were reviewed by me and considered in my medical decision making (see chart for details).  Patient presents with upper abdominal pain, diarrhea as noted above.  He reports the symptoms have essentially abated and he is feeling much better now.  He denies fevers or chills.  He did take a pain medication earlier today.  [facility] from [facility], has outpatient follow-up with GI and oncology arranged.  Lab work today is in keeping with prior values, he does have a history of chronic kidney disease, his hemoglobin is increased from prior.  Mild elevation of alk phos and T bili is chronic.  Discussed with patient's niece  Patient is feeling much better after pain medication and nausea medication, he is appropriate for discharge at this time    ____________________________________________   FINAL CLINICAL IMPRESSION(S) / ED DIAGNOSES  Final  diagnoses:  Diarrhea, unspecified type  Epigastric pain        Note:  This document was prepared using Dragon voice recognition software and may include unintentional dictation errors.   Lavonia Drafts, MD 06/17/20 2137

## 2020-06-17 NOTE — ED Notes (Signed)
[place] sent to the lab.  

## 2020-06-18 ENCOUNTER — Encounter: Payer: Self-pay | Admitting: Physician Assistant

## 2020-06-18 ENCOUNTER — Ambulatory Visit (INDEPENDENT_AMBULATORY_CARE_PROVIDER_SITE_OTHER): Payer: Medicare Other | Admitting: Physician Assistant

## 2020-06-18 VITALS — BP 110/50 | HR 60 | Ht 68.0 in | Wt 145.0 lb

## 2020-06-18 DIAGNOSIS — I4892 Unspecified atrial flutter: Secondary | ICD-10-CM

## 2020-06-18 DIAGNOSIS — E785 Hyperlipidemia, unspecified: Secondary | ICD-10-CM

## 2020-06-18 DIAGNOSIS — I5020 Unspecified systolic (congestive) heart failure: Secondary | ICD-10-CM

## 2020-06-18 DIAGNOSIS — Z79899 Other long term (current) drug therapy: Secondary | ICD-10-CM

## 2020-06-18 DIAGNOSIS — Z01810 Encounter for preprocedural cardiovascular examination: Secondary | ICD-10-CM | POA: Diagnosis not present

## 2020-06-18 DIAGNOSIS — I2510 Atherosclerotic heart disease of native coronary artery without angina pectoris: Secondary | ICD-10-CM | POA: Diagnosis not present

## 2020-06-18 DIAGNOSIS — D649 Anemia, unspecified: Secondary | ICD-10-CM

## 2020-06-18 DIAGNOSIS — D696 Thrombocytopenia, unspecified: Secondary | ICD-10-CM

## 2020-06-18 DIAGNOSIS — I10 Essential (primary) hypertension: Secondary | ICD-10-CM

## 2020-06-18 DIAGNOSIS — D3A8 Other benign neuroendocrine tumors: Secondary | ICD-10-CM

## 2020-06-18 DIAGNOSIS — N184 Chronic kidney disease, stage 4 (severe): Secondary | ICD-10-CM

## 2020-06-18 DIAGNOSIS — I272 Pulmonary hypertension, unspecified: Secondary | ICD-10-CM

## 2020-06-18 NOTE — Patient Instructions (Signed)
Medication Instructions:  1- STOP Aspirin, amlodipine, plavix ans hctz/lisinopril *If you need a refill on your cardiac medications before your next appointment, please call your pharmacy*   Lab Work: None ordered If you have labs (blood work) drawn today and your tests are completely normal, you will receive your results only by: Marland Kitchen MyChart Message (if you have MyChart) OR . A paper copy in the mail If you have any lab test that is abnormal or we need to change your treatment, we will call you to review the results.   Testing/Procedures: None ordered   Follow-Up: At [facility], you and your health needs are our priority.  As part of our continuing mission to provide you with exceptional heart care, we have created designated Provider Care Teams.  These Care Teams include your primary Cardiologist (physician) and Advanced Practice Providers (APPs -  Physician Assistants and Nurse Practitioners) who all work together to provide you with the care you need, when you need it.  We recommend signing up for the patient portal called "MyChart".  Sign up information is provided on this After Visit Summary.  MyChart is used to connect with patients for Virtual Visits (Telemedicine).  Patients are able to view lab/test results, encounter notes, upcoming appointments, etc.  Non-urgent messages can be sent to your provider as well.   To learn more about what you can do with MyChart, go to [email or web address].    Your next appointment:   2 week(s)  The format for your next appointment:   In Person  Provider:    You may see Ida Rogue, MD or Christell Faith, PA-C   Other Instructions Daughter to bring meds tomorrow to discuss with nurse

## 2020-06-19 DIAGNOSIS — K831 Obstruction of bile duct: Secondary | ICD-10-CM | POA: Diagnosis not present

## 2020-06-19 DIAGNOSIS — C801 Malignant (primary) neoplasm, unspecified: Secondary | ICD-10-CM | POA: Diagnosis not present

## 2020-06-19 DIAGNOSIS — C259 Malignant neoplasm of pancreas, unspecified: Secondary | ICD-10-CM | POA: Diagnosis not present

## 2020-06-19 DIAGNOSIS — Z01818 Encounter for other preprocedural examination: Secondary | ICD-10-CM | POA: Diagnosis not present

## 2020-06-20 ENCOUNTER — Telehealth: Payer: Self-pay | Admitting: Physician Assistant

## 2020-06-20 DIAGNOSIS — Z01810 Encounter for preprocedural cardiovascular examination: Secondary | ICD-10-CM

## 2020-06-20 MED ORDER — APIXABAN 5 MG PO TABS: 5.0000 mg | ORAL_TABLET | Freq: Two times a day (BID) | ORAL | 6 refills | Status: AC

## 2020-06-20 MED ORDER — MIDODRINE HCL 10 MG PO TABS: 10.0000 mg | ORAL_TABLET | Freq: Three times a day (TID) | ORAL | 3 refills | Status: AC

## 2020-06-20 MED ORDER — PANTOPRAZOLE SODIUM 40 MG PO TBEC: 40.0000 mg | DELAYED_RELEASE_TABLET | Freq: Every day | ORAL | 11 refills | Status: AC

## 2020-06-20 MED ORDER — METOPROLOL SUCCINATE ER 25 MG PO TB24
12.5000 mg | ORAL_TABLET | Freq: Every day | ORAL | 6 refills | Status: AC
Start: 2020-06-20 — End: ?

## 2020-06-20 MED ORDER — LEVOTHYROXINE SODIUM 50 MCG PO TABS
50.0000 ug | ORAL_TABLET | Freq: Every day | ORAL | 6 refills | Status: AC
Start: 2020-06-20 — End: ?

## 2020-06-20 MED ORDER — ROSUVASTATIN CALCIUM 10 MG PO TABS: 10.0000 mg | ORAL_TABLET | Freq: Every day | ORAL | 3 refills | Status: AC

## 2020-06-20 MED ORDER — AMIODARONE HCL 200 MG PO TABS
200.0000 mg | ORAL_TABLET | Freq: Every day | ORAL | 3 refills | Status: DC
Start: 2020-06-20 — End: 2020-06-25

## 2020-06-20 NOTE — Addendum Note (Signed)
Addended by: Verlon Au on: 06/20/2020 03:52 PM   Modules accepted: Orders

## 2020-06-20 NOTE — Telephone Encounter (Signed)
Updated med list and documentation sent to [facility].   Updated list sent to patient via mychart.

## 2020-06-20 NOTE — Telephone Encounter (Signed)
Late entry: Met with niece to review all patients medication and what he is currently taking. List shared in person with Christell Faith, PA.   See his response for further detail.

## 2020-06-20 NOTE — Telephone Encounter (Signed)
As noted at time of his outpatient visit on 06/18/2020, there was significant confusion regarding his medication list. His niece brought by all of the pill bottles the patient was taking for our review. His current pill intake includes:   Eliquis 5 mg twice daily Rosuvastatin 10 mg nightly  Rosuvastatin 10 mg nightly  Rosuvastatin 40 mg nightly  Omeprazole 20 mg daily  Protonix 40 mg daily  Amiodarone 200 mg daily  Toprol-XL 12.5 mg daily hold for systolic blood pressure less than 100  Midodrine 10 mg 3 times daily  Levothyroxine 50 mcg daily   He never did hold Eliquis or amiodarone as directed at time of discharge from his hospitalization in 05/2020. With this, his platelet count trended from 249-108 from 7/20 to 8/3.   We are looking into getting the patient's medications transferred over to a pharmacy that will supply pill packs. I also believe the patient would benefit from having a home health aide come into his house to help set up his medications, which can be set up through his PCP's office.   Recommendations regarding cardiac medications moving forward:  Eliquis 5 mg twice daily  Amiodarone 200 mg daily  Toprol-XL 12.5 mg daily hold for systolic blood pressure less than 100 or heart rate less than 60 bpm  Crestor 10 mg nightly  Midodrine 10 mg 3 times daily   Recommendations regarding noncardiac medications moving forward:  Protonix 40 mg daily  Levothyroxine 50 mcg daily   Recommendations regarding medications to be removed from his medication list:  Plavix - Our office has done extensive research into why/how this medication ended up on his medication list. This medication did not populate in his chart until 06/06/2020 and was placed on his medication list by a nurse at his oncologist office. His pharmacy has never received a prescription for this medication. A prescription for this medication has never been placed through epic or care everywhere. To the best of our  ability it appears he has never actually been placed on this medication. No further recommendations regarding this medicine at this time.   Amlodipine - This medicine has not been prescribed for nearly 2 years. I it also was added to his medication list on 06/06/2020 by the same nurse as outlined above. It remains uncertain where this information came from.   Lisinopril HCTZ - This medication has never been prescribed for the patient after extensive chart review in epic and care everywhere. As outlined above, this medication was added by his oncologist office on 06/06/2020.   Patient does not need both omeprazole and Protonix. Omeprazole to be discontinued. We will ask he only take Crestor 10 mg daily and discontinue the 40 mg dose as well as the second 10 mg dose.   We will route a message to the patient's oncology office for further clarification on the above mentioned medications.   Recommendations for upcoming ERCP:  Patient should hold Eliquis for 2 days prior to upcoming procedure with resumption of this medication as soon as safely possible per the performing team.    With his underlying history of thrombocytopenia, I would like to have him come in for a CBC on 8/9 to ensure stable platelet count.   His medication list will be updated as outlined above and faxed to his PCP for their records. We will also provide documentation to his GI team at [facility] indicating we have been unable to find evidence that he was actually ever taking Plavix. He will need  to hold Eliquis prior to his ERCP as outlined above.  In short, updated medication list includes Eliquis 5 mg twice daily Crestor 10 mg daily Protonix 40 mg daily Levothyroxine 50 mcg daily Midodrine 10 mg 3 times daily Amiodarone 200 mg daily Toprol-XL 12.5 mg daily hold for systolic blood pressure less than 100 or heart rate less than 60 bpm

## 2020-06-20 NOTE — Telephone Encounter (Signed)
Call attempted, no answer.  LMTCB 06/20/2020

## 2020-06-23 DIAGNOSIS — G4730 Sleep apnea, unspecified: Secondary | ICD-10-CM | POA: Diagnosis not present

## 2020-06-23 DIAGNOSIS — K8309 Other cholangitis: Secondary | ICD-10-CM | POA: Diagnosis not present

## 2020-06-23 DIAGNOSIS — J449 Chronic obstructive pulmonary disease, unspecified: Secondary | ICD-10-CM | POA: Diagnosis not present

## 2020-06-23 DIAGNOSIS — Z7901 Long term (current) use of anticoagulants: Secondary | ICD-10-CM | POA: Diagnosis not present

## 2020-06-23 DIAGNOSIS — I2510 Atherosclerotic heart disease of native coronary artery without angina pectoris: Secondary | ICD-10-CM | POA: Diagnosis not present

## 2020-06-23 DIAGNOSIS — E039 Hypothyroidism, unspecified: Secondary | ICD-10-CM | POA: Diagnosis not present

## 2020-06-23 DIAGNOSIS — Z794 Long term (current) use of insulin: Secondary | ICD-10-CM | POA: Diagnosis not present

## 2020-06-23 DIAGNOSIS — I959 Hypotension, unspecified: Secondary | ICD-10-CM | POA: Diagnosis not present

## 2020-06-23 DIAGNOSIS — N189 Chronic kidney disease, unspecified: Secondary | ICD-10-CM | POA: Diagnosis not present

## 2020-06-23 DIAGNOSIS — E1165 Type 2 diabetes mellitus with hyperglycemia: Secondary | ICD-10-CM | POA: Diagnosis not present

## 2020-06-23 DIAGNOSIS — I129 Hypertensive chronic kidney disease with stage 1 through stage 4 chronic kidney disease, or unspecified chronic kidney disease: Secondary | ICD-10-CM | POA: Diagnosis not present

## 2020-06-23 DIAGNOSIS — I4892 Unspecified atrial flutter: Secondary | ICD-10-CM | POA: Diagnosis not present

## 2020-06-23 DIAGNOSIS — E1122 Type 2 diabetes mellitus with diabetic chronic kidney disease: Secondary | ICD-10-CM | POA: Diagnosis not present

## 2020-06-23 DIAGNOSIS — E785 Hyperlipidemia, unspecified: Secondary | ICD-10-CM | POA: Diagnosis not present

## 2020-06-23 DIAGNOSIS — K2100 Gastro-esophageal reflux disease with esophagitis, without bleeding: Secondary | ICD-10-CM | POA: Diagnosis not present

## 2020-06-23 DIAGNOSIS — C259 Malignant neoplasm of pancreas, unspecified: Secondary | ICD-10-CM | POA: Diagnosis not present

## 2020-06-24 NOTE — Telephone Encounter (Signed)
Call attempted to see if they had received the mychart messages I have sent. No answer, no vm.

## 2020-06-25 ENCOUNTER — Other Ambulatory Visit: Payer: Self-pay

## 2020-06-25 MED ORDER — AMIODARONE HCL 200 MG PO TABS: 200.0000 mg | ORAL_TABLET | Freq: Every day | ORAL | 3 refills | Status: DC

## 2020-06-25 MED ORDER — OMEPRAZOLE 20 MG PO CPDR: 20.0000 mg | DELAYED_RELEASE_CAPSULE | Freq: Every day | ORAL | 3 refills | Status: DC

## 2020-06-25 NOTE — Telephone Encounter (Signed)
Call to niece per her request. Reviewed up to date med list.   I explained med list confusion at the cancer center and let her know it is being investigated.   I offered information on pill packs. She reported that she will hold off for now as home health is scheduled to start coming out.   She was very appreciative of call and to Christell Faith, PA for all of his hard work getting to the bottom of it.   Advised pt to call for any further questions or concerns.

## 2020-06-27 ENCOUNTER — Encounter: Payer: Self-pay | Admitting: Oncology

## 2020-06-27 ENCOUNTER — Other Ambulatory Visit: Payer: Self-pay | Admitting: Oncology

## 2020-06-27 ENCOUNTER — Telehealth: Payer: Self-pay | Admitting: *Deleted

## 2020-06-27 ENCOUNTER — Telehealth: Payer: Self-pay | Admitting: Oncology

## 2020-06-27 ENCOUNTER — Other Ambulatory Visit: Payer: Self-pay | Admitting: Nurse Practitioner

## 2020-06-27 ENCOUNTER — Other Ambulatory Visit: Payer: Self-pay

## 2020-06-27 MED ORDER — OXYCODONE HCL 5 MG PO TABS: 5.0000 mg | ORAL_TABLET | Freq: Two times a day (BID) | ORAL | 0 refills | Status: DC | PRN

## 2020-06-27 MED ORDER — OXYCODONE HCL 5 MG PO TABS: 5.0000 mg | ORAL_TABLET | Freq: Two times a day (BID) | ORAL | 0 refills | Status: AC | PRN

## 2020-06-27 NOTE — Telephone Encounter (Signed)
Ok to refill 

## 2020-06-27 NOTE — Telephone Encounter (Signed)
I called pt and he needs r/s appt due to he has court date on 8/31. Appt changed to 8/20 and pt was agreeable to new appt

## 2020-06-27 NOTE — Telephone Encounter (Signed)
Pt called earlier and asked about r/s appt because he has court date on 8/31. We changed his appt to 8/20 and he is agreeable 1:45 lab , see md2:15 and then inj. After. He missed hsi injection in early aug due to going to ER. Once he gets the inj next week we will start him back on regular injections monthly. He is agreeable and will be here next week

## 2020-06-27 NOTE — Telephone Encounter (Signed)
Judeen Hammans will call him

## 2020-06-27 NOTE — Telephone Encounter (Signed)
Called to reschdule patient appt for 8/31. Pt has court that day. If he gets a letter he can keep the appt, if not it needs to be rescheduled. Please advise.

## 2020-06-30 ENCOUNTER — Telehealth: Payer: Self-pay | Admitting: *Deleted

## 2020-06-30 DIAGNOSIS — E1122 Type 2 diabetes mellitus with diabetic chronic kidney disease: Secondary | ICD-10-CM | POA: Diagnosis not present

## 2020-06-30 DIAGNOSIS — Z720 Tobacco use: Secondary | ICD-10-CM | POA: Diagnosis not present

## 2020-06-30 DIAGNOSIS — I10 Essential (primary) hypertension: Secondary | ICD-10-CM | POA: Diagnosis not present

## 2020-06-30 DIAGNOSIS — E785 Hyperlipidemia, unspecified: Secondary | ICD-10-CM | POA: Diagnosis not present

## 2020-06-30 NOTE — Progress Notes (Signed)
Cardiology Office Note    Date:  07/04/2020   ID:  Ples, Trudel 06-27-1946, MRN [id number]  PCP:  [name], MD  Cardiologist:  Ida Rogue, MD  Electrophysiologist:  None   Chief Complaint: Follow up  History of Present Illness:   [name] is a 74 y.o. 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  He was initially diagnosed with atrial flutter in 2002 and has undergone multiple prior cardioversions.  He underwent ablation for typical atrial flutter in 08/2016, though subsequently had recurrence of atrial arrhythmia requiring DCCV.  Echo in 09/2016 showed an EF of 60 to 65%, no regional wall motion abnormalities, mild mitral regurgitation, normal size left atrium, normal RV systolic function, and normal PASP.  He underwent repeat ablation of the cavotricuspid isthmus in 09/2017 by Dr. Curt Bears.    More recently, he was admitted to OSH in 02/2020 for his biliary obstruction complicated by septic shock, AKI requiring temporary dialysis, and GI bleeding.  Apixaban was temporarily suspended and ultimately resumed at reduced dose to minimize bleeding risk despite dosing criteria.  He was also continued on amiodarone.  Echo during this admission, showed an EF of 50%, apical and septal wall hypokinesis, mildly decreased RV systolic function with mildly enlarged RV cavity size, trivial mitral regurgitation, trivial tricuspid regurgitation, negative bubble study, patient was tachycardic throughout the  study and a rate of 125 bpm.  He was evaluated by [facility] in 04/2020 and was maintaining sinus rhythm at that time.  After review of the patient's past medical history including significant comorbid conditions with recent acute illnesses, and in the setting of the patient maintaining sinus rhythm, continued medical therapy with amiodarone and apixaban was preferred.  He was admitted to the hospital in 05/2020 with sepsis secondary to E. coli and Klebsiella bacteremia secondary to cholangitis with obstructive jaundice in the setting of pancreatic neuroendocrine tumor requiring mechanical ventilation and vasopressor support.  During this admission, an outside cardiology group was consulted though no note is available for review.  Primary service discontinued his Eliquis with thrombocytopenia with a platelet count noted of 49 on 05/20/2020 with subsequent improvement in value moving forward.  His amiodarone was also discontinued with heart rates in the 60s bpm.  He was started on Toprol-XL 12.5 mg daily given soft BP.  It appeared this was discussed with [facility] per discharge summary.  Echo during that admission showed an EF of 35 to 40%, normal LV diastolic function parameters, moderately reduced RV systolic function with moderately large RV cavity size and mildly increased RV wall thickness, elevated PASP estimated at 67.8 mmHg, mild to moderate biatrial enlargement, mild to moderate mitral regurgitation, moderate tricuspid regurgitation, and trivial aortic insufficiency.  He was seen in the [facility] ED on 06/17/2020 with abdominal pain and diarrhea noted earlier that day.  EKG showed a narrow complex rhythm with significant underlying baseline artifact making further interpretation difficult.  BP 93/58.  Symptoms improved while in the ED and he was discharged to outpatient follow-up.  He was seen in our office on 06/18/2020 for preprocedure  cardiac evaluation for ERCP and was doing well from a cardiac  perspective.  It was noted his weight was down 24 pounds when compared to his prior office visit in 09/2019.  However, there was significant confusion regarding his medications.  We had his niece bring in his pill bottles the following day for our review and discovered, his living facility had sent the wrong medication list to his oncology visit on 06/08/2020. After reviewing his medications, his list was corrected and updated.  It was also recommended he get established with a pharmacy that provided pill packs or have home health assist.   He comes in accompanied by his niece today and is doing well from a cardiac perspective. He was unable to undergo ERCP secondary to underlying comorbid conditions. He will follow up with his treatment team regarding this next month. He denies any chest pain, dyspnea, palpitations, presyncope, or syncope. He does note some positional dizziness. No lower extremity swelling, abdominal distention, orthopnea, PND, early satiety. His weight has been stable. Appetite remains good. He is tolerating medications without issues.   Labs independently reviewed: 06/2020 - Hgb 9.4, PLT 108, potassium 4.2, BUN 40, serum creatinine 3.4, albumin 3.3, AST/ALT normal 05/2020 - magnesium 2.1, A1c 8.2 11/2018 - TC 79, TG 98, HDL 32, LDL 27 11/2016 - TSH normal   Past Medical History:  Diagnosis Date  . Aortic atherosclerosis ([place])    As seen on 04/2019 CT  . Atrial flutter ([place])    a. s/p ablation 2017/2018 and DCCV 2015/2016/2019 b. 2017 echo EF 60-65%, no RWMA, mild MR,  . CAD (coronary artery disease)    3v CAD 04/2019 CT  . Cancer ([place])   . Carcinoid tumor    metastatic  . CKD (chronic kidney disease), stage III   . COPD (chronic obstructive pulmonary disease) ([facility])    PM oxygen  . Current occasional smoker    1-2 cigarettes / week  . Diabetes mellitus type 2, insulin dependent ([place])   . Heart failure with preserved ejection fraction ([place])    2017 echo EF 60-65%, no RWMA,  mild MR,    . Hyperlipidemia   . Hypertension   . Pancreatitis   . Polycythemia     Past Surgical History:  Procedure Laterality Date  . A-FLUTTER ABLATION N/A 10/14/2017   Procedure: A-FLUTTER ABLATION;  Surgeon: Constance Haw, MD;  Location: [place] [facility];  Service: Cardiovascular;  Laterality: N/A;  . CARDIOVERSION N/A 09/08/2017   Procedure: CARDIOVERSION;  Surgeon: [name], MD;  Location: [facility] ORS;  Service: Cardiovascular;  Laterality: N/A;  . CARDIOVERSION N/A 03/22/2018   Procedure: CARDIOVERSION;  Surgeon: [name], MD;  Location: [facility] ORS;  Service: Cardiovascular;  Laterality: N/A;  . COLONOSCOPY    . COLONOSCOPY WITH PROPOFOL N/A 10/31/2019   Procedure: COLONOSCOPY WITH PROPOFOL;  Surgeon: Lin Landsman, MD;  Location: [facility] ENDOSCOPY;  Service: Gastroenterology;  Laterality: N/A;  . ELECTROPHYSIOLOGIC STUDY N/A 10/27/2016   Procedure: A-Flutter Ablation;  Surgeon: Deboraha Sprang, MD;  Location: [place] [facility];  Service: Cardiovascular;  Laterality: N/A;  . ENDOSCOPIC RETROGRADE CHOLANGIOPANCREATOGRAPHY (ERCP) WITH PROPOFOL N/A 05/21/2020   Procedure: ENDOSCOPIC RETROGRADE CHOLANGIOPANCREATOGRAPHY (ERCP) WITH PROPOFOL;  Surgeon: [name], MD;  Location: [facility] ENDOSCOPY;  Service: Endoscopy;  Laterality: N/A;  . EUS N/A 12/22/2017   Procedure: FULL UPPER ENDOSCOPIC ULTRASOUND (EUS) RADIAL;  Surgeon: Reita Cliche, MD;  Location: [facility] ENDOSCOPY;  Service: Gastroenterology;  Laterality: N/A;  . TEE  WITH CARDIOVERSION  12/16  . TEMPORARY DIALYSIS CATHETER N/A 02/08/2020   Procedure: TEMPORARY DIALYSIS CATHETER;  Surgeon: Algernon Huxley, MD;  Location: [place] [facility];  Service: Cardiovascular;  Laterality: N/A;    Current Medications: Current Meds  Medication Sig  . acetaminophen (TYLENOL) 500 MG tablet Take 500-1,000 mg by mouth every 6 (six) hours as needed for moderate pain or headache.   Marland Kitchen amiodarone (PACERONE) 200 MG tablet Take  1 tablet (200 mg total) by mouth daily.  Marland Kitchen apixaban (ELIQUIS) 5 MG TABS tablet Take 1 tablet (5 mg total) by mouth 2 (two) times daily.  Marland Kitchen docusate sodium (COLACE) 100 MG capsule Take 100 mg by mouth daily as needed for mild constipation.  . Infant Care Products (DERMACLOUD EX) Apply 1 application topically as needed (with every incontence).  . insulin glargine (LANTUS) 100 UNIT/ML injection Inject 0.12 mLs (12 Units total) into the skin at bedtime. (Patient taking differently: Inject 22 Units into the skin at bedtime. )  . levothyroxine (SYNTHROID) 50 MCG tablet Take 1 tablet (50 mcg total) by mouth daily before breakfast.  . metoprolol succinate (TOPROL XL) 25 MG 24 hr tablet Take 0.5 tablets (12.5 mg total) by mouth daily. Hold for sbp < 100 or HR < 60.  . midodrine (PROAMATINE) 10 MG tablet Take 1 tablet (10 mg total) by mouth 3 (three) times daily.  Marland Kitchen oxyCODONE (OXY IR/ROXICODONE) 5 MG immediate release tablet Take 1 tablet (5 mg total) by mouth every 12 (twelve) hours as needed for severe pain.  . pantoprazole (PROTONIX) 40 MG tablet Take 1 tablet (40 mg total) by mouth daily.  . rosuvastatin (CRESTOR) 10 MG tablet Take 1 tablet (10 mg total) by mouth at bedtime.    Allergies:   Levaquin [levofloxacin in d5w]   Social History   Socioeconomic History  . Marital status: Widowed    Spouse name: Not on file  . Number of children: Not on file  . Years of education: Not on file  . Highest education level: Not on file  Occupational History  . Not on file  Tobacco Use  . Smoking status: Former Smoker    Years: 45.00    Types: Cigarettes    Quit date: 02/02/2020    Years since quitting: 0.4  . Smokeless tobacco: Never Used  Vaping Use  . Vaping Use: Never used  Substance and Sexual Activity  . Alcohol use: Not Currently  . Drug use: No  . Sexual activity: Not Currently  Other Topics Concern  . Not on file  Social History Narrative  . Not on file   Social Determinants of Health     Financial Resource Strain:   . Difficulty of Paying Living Expenses: Not on file  Food Insecurity:   . Worried About Charity fundraiser in the Last Year: Not on file  . Ran Out of Food in the Last Year: Not on file  Transportation Needs:   . Lack of Transportation (Medical): Not on file  . Lack of Transportation (Non-Medical): Not on file  Physical Activity:   . Days of Exercise per Week: Not on file  . Minutes of Exercise per Session: Not on file  Stress:   . Feeling of Stress : Not on file  Social Connections:   . Frequency of Communication with Friends and Family: Not on file  . Frequency of Social Gatherings with Friends and Family: Not on file  . Attends Religious Services: Not on file  .  Active Member of Clubs or Organizations: Not on file  . Attends Archivist Meetings: Not on file  . Marital Status: Not on file     Family History:  The patient's family history includes Hypertension in his father.  ROS:   Review of Systems  Constitutional: Positive for malaise/fatigue. Negative for chills, diaphoresis, fever and weight loss.  HENT: Negative for congestion.   Eyes: Negative for discharge and redness.  Respiratory: Negative for cough, sputum production, shortness of breath and wheezing.   Cardiovascular: Negative for chest pain, palpitations, orthopnea, claudication, leg swelling and PND.  Gastrointestinal: Negative for abdominal pain, blood in stool, heartburn, melena, nausea and vomiting.  Musculoskeletal: Negative for falls and myalgias.  Skin: Negative for rash.  Neurological: Positive for dizziness and weakness. Negative for tingling, tremors, sensory change, speech change, focal weakness and loss of consciousness.  Endo/Heme/Allergies: Does not bruise/bleed easily.  Psychiatric/Behavioral: Negative for substance abuse. The patient is not nervous/anxious.   All other systems reviewed and are negative.    EKGs/Labs/Other Studies Reviewed:    Studies  reviewed were summarized above. The additional studies were reviewed today:  2D echo 05/2020: 1. Left ventricular ejection fraction, by estimation, is 35 to 40%. The  left ventricle has moderately decreased function. The left ventricle  demonstrates regional wall motion abnormalities (see scoring  diagram/findings for description). Left ventricular  diastolic parameters were normal.  2. Right ventricular systolic function is moderately reduced. The right  ventricular size is moderately enlarged. Mildly increased right  ventricular wall thickness. There is severely elevated pulmonary artery  systolic pressure.  3. Left atrial size was mild to moderately dilated.  4. Right atrial size was mild to moderately dilated.  5. The mitral valve is normal in structure. Mild to moderate mitral valve  regurgitation.  6. Tricuspid valve regurgitation is moderate.  7. The aortic valve is normal in structure. Aortic valve regurgitation is  trivial.    EKG:  EKG is ordered today.  The EKG ordered today demonstrates sinus bradycardia, 47 bpm, RBBB  Recent Labs: 05/29/2020: Magnesium 2.1 06/17/2020: ALT 24; BUN 40; Creatinine, Ser 3.40; Hemoglobin 9.4; Platelets 108; Potassium 4.2; Sodium 138  Recent Lipid Panel    Component Value Date/Time   CHOL 79 11/26/2018 0504   TRIG 98 11/26/2018 0504   HDL 32 (L) 11/26/2018 0504   CHOLHDL 2.5 11/26/2018 0504   VLDL 20 11/26/2018 0504   LDLCALC 27 11/26/2018 0504    PHYSICAL EXAM:    VS:  BP (!) 106/58 (BP Location: Left Arm, Patient Position: Sitting, Cuff Size: Normal)   Pulse (!) 47   Ht 5\' 8"  (1.727 m)   Wt 143 lb 9.6 oz (65.1 kg)   SpO2 93%   BMI 21.83 kg/m   BMI: Body mass index is 21.83 kg/m.  Physical Exam Constitutional:      Appearance: He is well-developed.     Comments: Frail and chronically ill-appearing.  HENT:     Head: Normocephalic and atraumatic.  Eyes:     General:        Right eye: No discharge.        Left eye: No  discharge.  Neck:     Vascular: No JVD.  Cardiovascular:     Rate and Rhythm: Normal rate and regular rhythm.     Pulses: No midsystolic click and no opening snap.          Posterior tibial pulses are 2+ on the right side and 2+ on  the left side.     Heart sounds: S1 normal and S2 normal. Heart sounds not distant. Murmur heard. High-pitched blowing holosystolic murmur is present with a grade of 1/6 at the apex.  No friction rub.  Pulmonary:     Effort: Pulmonary effort is normal. No respiratory distress.     Breath sounds: Normal breath sounds. No decreased breath sounds, wheezing or rales.  Chest:     Chest wall: No tenderness.  Abdominal:     General: There is no distension.     Palpations: Abdomen is soft.     Tenderness: There is no abdominal tenderness.  Musculoskeletal:     Cervical back: Normal range of motion.  Skin:    General: Skin is warm and dry.     Nails: There is no clubbing.  Neurological:     Mental Status: He is alert and oriented to person, place, and time.  Psychiatric:        Speech: Speech normal.        Behavior: Behavior normal.        Thought Content: Thought content normal.        Judgment: Judgment normal.     Wt Readings from Last 3 Encounters:  07/04/20 143 lb 9.6 oz (65.1 kg)  06/18/20 145 lb (65.8 kg)  06/17/20 145 lb (65.8 kg)     ASSESSMENT & PLAN:   1. HFrEF/pulmonary hypertension: His cardiomyopathy is of uncertain etiology at this time. He appears euvolemic and well compensated. We had a lengthy discussion today regarding potential ischemic evaluation modalities. He does not wish to undergo any potential testing which could further worsen his renal function potentially leading to dialysis. In this setting, he declines coronary CTA or diagnostic R/LHC. We did discuss potential Lexiscan MPI though he notes he would not move forward with further evaluation of abnormal images with diagnostic LHC and therefore declines proceeding with this at  this time. Relative hypotension, underlying bradycardia and renal dysfunction preclude escalation of GDMT including potential addition of ACE inhibitor/ARB/Entresto/MRA. Continue low-dose Toprol-XL 12.5 mg daily. Recommend escalation of evidence-based medical therapy as/if able moving forward. CHF education.   2. Atrial flutter: He is maintaining sinus rhythm with a bradycardic heart rate. Discontinue amiodarone. We will continue the patient on a low-dose Toprol-XL given his underlying cardiomyopathy as outlined above. Continue Eliquis. He denies any symptoms concerning for bleeding with recent Hgb and PLT count being stable as outlined above. CHA2DS2-VASc 6.  3. CAD involving the native coronary arteries without angina: He denies any symptoms concerning for angina. Continue Eliquis in place of aspirin. Continue Crestor. He declines ischemic evaluation as outlined above.  4. Anemia/thrombocytopenia: Stable on most recent CBC.  5. CKD stage IV: He does not want dialysis moving forward. Followed by PCP.   6. HTN: Blood pressure is on the soft side today. He remains on midodrine. Continue Toprol-XL.  7. HLD: LDL of 27. He remains on Crestor.  8. Neuroendocrine tumor: Followed by oncology. Patient and family have indicated he has an of 6 months.  Disposition: F/u with Dr. Rockey Situ or an APP in 4 weeks.   Medication Adjustments/Labs and Tests Ordered: Current medicines are reviewed at length with the patient today.  Concerns regarding medicines are outlined above. Medication changes, Labs and Tests ordered today are summarized above and listed in the Patient Instructions accessible in Encounters.   SignedChristell Faith, PA-C 07/04/2020 11:25 AM     [address]  [place], [place] [address] [phone]

## 2020-06-30 NOTE — Telephone Encounter (Signed)
Insurance will only fill a 7 day prescription without a prior authorization for this medicine. Patient given 7 days on 8/13 and the rest was voided

## 2020-07-04 ENCOUNTER — Other Ambulatory Visit: Payer: Self-pay

## 2020-07-04 ENCOUNTER — Encounter: Payer: Self-pay | Admitting: *Deleted

## 2020-07-04 ENCOUNTER — Inpatient Hospital Stay: Payer: Medicare Other | Attending: Oncology

## 2020-07-04 ENCOUNTER — Inpatient Hospital Stay: Payer: Medicare Other

## 2020-07-04 ENCOUNTER — Encounter: Payer: Self-pay | Admitting: Physician Assistant

## 2020-07-04 ENCOUNTER — Ambulatory Visit: Payer: Medicare Other | Admitting: Physician Assistant

## 2020-07-04 ENCOUNTER — Inpatient Hospital Stay (HOSPITAL_BASED_OUTPATIENT_CLINIC_OR_DEPARTMENT_OTHER): Payer: Medicare Other | Admitting: Oncology

## 2020-07-04 ENCOUNTER — Encounter: Payer: Self-pay | Admitting: Oncology

## 2020-07-04 VITALS — BP 115/59 | HR 44 | Temp 96.2°F | Resp 16 | Wt 144.5 lb

## 2020-07-04 VITALS — BP 106/58 | HR 47 | Ht 68.0 in | Wt 143.6 lb

## 2020-07-04 DIAGNOSIS — E1122 Type 2 diabetes mellitus with diabetic chronic kidney disease: Secondary | ICD-10-CM | POA: Diagnosis not present

## 2020-07-04 DIAGNOSIS — C250 Malignant neoplasm of head of pancreas: Secondary | ICD-10-CM | POA: Diagnosis not present

## 2020-07-04 DIAGNOSIS — D649 Anemia, unspecified: Secondary | ICD-10-CM | POA: Insufficient documentation

## 2020-07-04 DIAGNOSIS — N183 Chronic kidney disease, stage 3 unspecified: Secondary | ICD-10-CM | POA: Diagnosis not present

## 2020-07-04 DIAGNOSIS — R5381 Other malaise: Secondary | ICD-10-CM | POA: Diagnosis not present

## 2020-07-04 DIAGNOSIS — J449 Chronic obstructive pulmonary disease, unspecified: Secondary | ICD-10-CM | POA: Diagnosis not present

## 2020-07-04 DIAGNOSIS — F17210 Nicotine dependence, cigarettes, uncomplicated: Secondary | ICD-10-CM | POA: Insufficient documentation

## 2020-07-04 DIAGNOSIS — I4892 Unspecified atrial flutter: Secondary | ICD-10-CM

## 2020-07-04 DIAGNOSIS — N189 Chronic kidney disease, unspecified: Secondary | ICD-10-CM

## 2020-07-04 DIAGNOSIS — Z5181 Encounter for therapeutic drug level monitoring: Secondary | ICD-10-CM | POA: Diagnosis not present

## 2020-07-04 DIAGNOSIS — N184 Chronic kidney disease, stage 4 (severe): Secondary | ICD-10-CM

## 2020-07-04 DIAGNOSIS — I130 Hypertensive heart and chronic kidney disease with heart failure and stage 1 through stage 4 chronic kidney disease, or unspecified chronic kidney disease: Secondary | ICD-10-CM | POA: Diagnosis not present

## 2020-07-04 DIAGNOSIS — I2510 Atherosclerotic heart disease of native coronary artery without angina pectoris: Secondary | ICD-10-CM

## 2020-07-04 DIAGNOSIS — C7A019 Malignant carcinoid tumor of the small intestine, unspecified portion: Secondary | ICD-10-CM | POA: Insufficient documentation

## 2020-07-04 DIAGNOSIS — D696 Thrombocytopenia, unspecified: Secondary | ICD-10-CM

## 2020-07-04 DIAGNOSIS — R109 Unspecified abdominal pain: Secondary | ICD-10-CM | POA: Insufficient documentation

## 2020-07-04 DIAGNOSIS — Z794 Long term (current) use of insulin: Secondary | ICD-10-CM | POA: Insufficient documentation

## 2020-07-04 DIAGNOSIS — Z79899 Other long term (current) drug therapy: Secondary | ICD-10-CM | POA: Insufficient documentation

## 2020-07-04 DIAGNOSIS — E785 Hyperlipidemia, unspecified: Secondary | ICD-10-CM | POA: Insufficient documentation

## 2020-07-04 DIAGNOSIS — R5383 Other fatigue: Secondary | ICD-10-CM | POA: Diagnosis not present

## 2020-07-04 DIAGNOSIS — I5020 Unspecified systolic (congestive) heart failure: Secondary | ICD-10-CM

## 2020-07-04 DIAGNOSIS — D631 Anemia in chronic kidney disease: Secondary | ICD-10-CM

## 2020-07-04 DIAGNOSIS — Z7901 Long term (current) use of anticoagulants: Secondary | ICD-10-CM | POA: Diagnosis not present

## 2020-07-04 DIAGNOSIS — M311 Thrombotic microangiopathy: Secondary | ICD-10-CM | POA: Insufficient documentation

## 2020-07-04 DIAGNOSIS — D3A8 Other benign neuroendocrine tumors: Secondary | ICD-10-CM

## 2020-07-04 DIAGNOSIS — I5032 Chronic diastolic (congestive) heart failure: Secondary | ICD-10-CM | POA: Diagnosis not present

## 2020-07-04 DIAGNOSIS — D3A098 Benign carcinoid tumors of other sites: Secondary | ICD-10-CM

## 2020-07-04 LAB — CBC WITH DIFFERENTIAL/PLATELET
Abs Immature Granulocytes: 0.55 10*3/uL — ABNORMAL HIGH (ref 0.00–0.07)
Basophils Absolute: 0.1 10*3/uL (ref 0.0–0.1)
Basophils Relative: 1 %
Eosinophils Absolute: 0.2 10*3/uL (ref 0.0–0.5)
Eosinophils Relative: 3 %
HCT: 28.8 % — ABNORMAL LOW (ref 39.0–52.0)
Hemoglobin: 9.4 g/dL — ABNORMAL LOW (ref 13.0–17.0)
Immature Granulocytes: 7 %
Lymphocytes Relative: 10 %
Lymphs Abs: 0.8 10*3/uL (ref 0.7–4.0)
MCH: 32 pg (ref 26.0–34.0)
MCHC: 32.6 g/dL (ref 30.0–36.0)
MCV: 98 fL (ref 80.0–100.0)
Monocytes Absolute: 0.6 10*3/uL (ref 0.1–1.0)
Monocytes Relative: 8 %
Neutro Abs: 5.4 10*3/uL (ref 1.7–7.7)
Neutrophils Relative %: 71 %
Platelets: 200 10*3/uL (ref 150–400)
RBC: 2.94 MIL/uL — ABNORMAL LOW (ref 4.22–5.81)
RDW: 15.9 % — ABNORMAL HIGH (ref 11.5–15.5)
Smear Review: NORMAL
WBC: 7.6 10*3/uL (ref 4.0–10.5)
nRBC: 0 % (ref 0.0–0.2)

## 2020-07-04 LAB — COMPREHENSIVE METABOLIC PANEL
ALT: 60 U/L — ABNORMAL HIGH (ref 0–44)
AST: 53 U/L — ABNORMAL HIGH (ref 15–41)
Albumin: 2.9 g/dL — ABNORMAL LOW (ref 3.5–5.0)
Alkaline Phosphatase: 394 U/L — ABNORMAL HIGH (ref 38–126)
Anion gap: 10 (ref 5–15)
BUN: 56 mg/dL — ABNORMAL HIGH (ref 8–23)
CO2: 17 mmol/L — ABNORMAL LOW (ref 22–32)
Calcium: 7.9 mg/dL — ABNORMAL LOW (ref 8.9–10.3)
Chloride: 111 mmol/L (ref 98–111)
Creatinine, Ser: 3.9 mg/dL — ABNORMAL HIGH (ref 0.61–1.24)
GFR calc Af Amer: 16 mL/min — ABNORMAL LOW (ref >60)
GFR calc non Af Amer: 14 mL/min — ABNORMAL LOW (ref >60)
Glucose, Bld: 143 mg/dL — ABNORMAL HIGH (ref 70–99)
Potassium: 4.1 mmol/L (ref 3.5–5.1)
Sodium: 138 mmol/L (ref 135–145)
Total Bilirubin: 1 mg/dL (ref 0.3–1.2)
Total Protein: 6.1 g/dL — ABNORMAL LOW (ref 6.5–8.1)

## 2020-07-04 MED ORDER — OCTREOTIDE ACETATE 30 MG IM KIT
30.0000 mg | PACK | INTRAMUSCULAR | Status: AC
  Administered 2020-07-04: 30 mg via INTRAMUSCULAR
  Filled 2020-07-04: qty 1

## 2020-07-04 NOTE — Patient Instructions (Signed)
Medication Instructions:  1- STOP Amiodarone  *If you need a refill on your cardiac medications before your next appointment, please call your pharmacy*   Lab Work: none ordered If you have labs (blood work) drawn today and your tests are completely normal, you will receive your results only by: Marland Kitchen MyChart Message (if you have MyChart) OR . A paper copy in the mail If you have any lab test that is abnormal or we need to change your treatment, we will call you to review the results.   Testing/Procedures: none ordered  Follow-Up: At [facility], you and your health needs are our priority.  As part of our continuing mission to provide you with exceptional heart care, we have created designated Provider Care Teams.  These Care Teams include your primary Cardiologist (physician) and Advanced Practice Providers (APPs -  Physician Assistants and Nurse Practitioners) who all work together to provide you with the care you need, when you need it.  We recommend signing up for the patient portal called "MyChart".  Sign up information is provided on this After Visit Summary.  MyChart is used to connect with patients for Virtual Visits (Telemedicine).  Patients are able to view lab/test results, encounter notes, upcoming appointments, etc.  Non-urgent messages can be sent to your provider as well.   To learn more about what you can do with MyChart, go to [email or web address].    Your next appointment:   1 month(s)  The format for your next appointment:   In Person  Provider:    You may see Ida Rogue, MD or Christell Faith, PA-C

## 2020-07-08 ENCOUNTER — Telehealth: Payer: Self-pay | Admitting: *Deleted

## 2020-07-08 ENCOUNTER — Other Ambulatory Visit: Payer: Self-pay | Admitting: Oncology

## 2020-07-08 DIAGNOSIS — R1084 Generalized abdominal pain: Secondary | ICD-10-CM

## 2020-07-08 DIAGNOSIS — C7A019 Malignant carcinoid tumor of the small intestine, unspecified portion: Secondary | ICD-10-CM

## 2020-07-08 LAB — CHROMOGRANIN A: Chromogranin A (ng/mL): 2256 ng/mL — ABNORMAL HIGH (ref 0.0–101.8)

## 2020-07-08 NOTE — Telephone Encounter (Signed)
Call from [place] reporting that patient is having abdominal pain not controlled by Oxycodone and that he is also shaking. She dis not know if he has fever or not and does not have a thermometer., she is in transit to his home now I called and spoke with patient who reports that he is having shaking chills and also reports that he doe snot have a thermometer. Please advise

## 2020-07-08 NOTE — Telephone Encounter (Signed)
He will need to be seen in person. He has been quite sick in the past.recent labs were reassuring. Options are to see him in [facility] tomorrow (not sure if today is possible) if he can wait and avoid ER trip or go to ER with long wait times. He will need ct abdomen without contrast to see whats going on

## 2020-07-08 NOTE — Telephone Encounter (Signed)
I called and spoke to Malcolm, pt's niece. I informed her of CT appt at 8:30 tomorrow morning. I told her someone would need to go to [facility] outpatient imaging on [address] and pick up oral contrast before 5:30 pm today. She said," there is no way possible he can drink that stuff, he is NOT going to be able to handle that scan right now. His stomach is bad right now." I asked if she thought he needed to go to hospital this evening, she said "no, his shaking has stopped." I offered an appt at cancer center tomorrow morning at 9am to see an NP. She said she would have pt here in the morning.

## 2020-07-09 ENCOUNTER — Inpatient Hospital Stay: Payer: Medicare Other

## 2020-07-09 ENCOUNTER — Other Ambulatory Visit: Payer: Self-pay

## 2020-07-09 ENCOUNTER — Other Ambulatory Visit: Payer: Self-pay | Admitting: *Deleted

## 2020-07-09 ENCOUNTER — Ambulatory Visit: Admission: RE | Admit: 2020-07-09 | Payer: Medicare Other | Source: Ambulatory Visit

## 2020-07-09 ENCOUNTER — Inpatient Hospital Stay (HOSPITAL_BASED_OUTPATIENT_CLINIC_OR_DEPARTMENT_OTHER): Payer: Medicare Other | Admitting: Oncology

## 2020-07-09 VITALS — BP 89/45 | HR 56 | Temp 97.3°F | Resp 18 | Wt 147.0 lb

## 2020-07-09 DIAGNOSIS — E1122 Type 2 diabetes mellitus with diabetic chronic kidney disease: Secondary | ICD-10-CM | POA: Diagnosis not present

## 2020-07-09 DIAGNOSIS — A419 Sepsis, unspecified organism: Secondary | ICD-10-CM | POA: Diagnosis not present

## 2020-07-09 DIAGNOSIS — C7A019 Malignant carcinoid tumor of the small intestine, unspecified portion: Secondary | ICD-10-CM

## 2020-07-09 DIAGNOSIS — K838 Other specified diseases of biliary tract: Secondary | ICD-10-CM | POA: Diagnosis not present

## 2020-07-09 DIAGNOSIS — R6521 Severe sepsis with septic shock: Secondary | ICD-10-CM | POA: Diagnosis not present

## 2020-07-09 DIAGNOSIS — R109 Unspecified abdominal pain: Secondary | ICD-10-CM | POA: Diagnosis not present

## 2020-07-09 DIAGNOSIS — J449 Chronic obstructive pulmonary disease, unspecified: Secondary | ICD-10-CM | POA: Diagnosis not present

## 2020-07-09 DIAGNOSIS — K831 Obstruction of bile duct: Secondary | ICD-10-CM | POA: Diagnosis not present

## 2020-07-09 DIAGNOSIS — R5383 Other fatigue: Secondary | ICD-10-CM | POA: Diagnosis not present

## 2020-07-09 DIAGNOSIS — E872 Acidosis: Secondary | ICD-10-CM | POA: Diagnosis not present

## 2020-07-09 DIAGNOSIS — N179 Acute kidney failure, unspecified: Secondary | ICD-10-CM | POA: Diagnosis not present

## 2020-07-09 DIAGNOSIS — R221 Localized swelling, mass and lump, neck: Secondary | ICD-10-CM | POA: Diagnosis not present

## 2020-07-09 DIAGNOSIS — I130 Hypertensive heart and chronic kidney disease with heart failure and stage 1 through stage 4 chronic kidney disease, or unspecified chronic kidney disease: Secondary | ICD-10-CM | POA: Diagnosis not present

## 2020-07-09 DIAGNOSIS — F17210 Nicotine dependence, cigarettes, uncomplicated: Secondary | ICD-10-CM | POA: Diagnosis not present

## 2020-07-09 DIAGNOSIS — C250 Malignant neoplasm of head of pancreas: Secondary | ICD-10-CM | POA: Diagnosis not present

## 2020-07-09 DIAGNOSIS — I5032 Chronic diastolic (congestive) heart failure: Secondary | ICD-10-CM | POA: Diagnosis not present

## 2020-07-09 DIAGNOSIS — R5381 Other malaise: Secondary | ICD-10-CM | POA: Diagnosis not present

## 2020-07-09 DIAGNOSIS — E785 Hyperlipidemia, unspecified: Secondary | ICD-10-CM | POA: Diagnosis not present

## 2020-07-09 DIAGNOSIS — Z794 Long term (current) use of insulin: Secondary | ICD-10-CM | POA: Diagnosis not present

## 2020-07-09 DIAGNOSIS — I2510 Atherosclerotic heart disease of native coronary artery without angina pectoris: Secondary | ICD-10-CM | POA: Diagnosis not present

## 2020-07-09 DIAGNOSIS — D649 Anemia, unspecified: Secondary | ICD-10-CM | POA: Diagnosis not present

## 2020-07-09 DIAGNOSIS — R1084 Generalized abdominal pain: Secondary | ICD-10-CM

## 2020-07-09 DIAGNOSIS — N189 Chronic kidney disease, unspecified: Secondary | ICD-10-CM | POA: Diagnosis not present

## 2020-07-09 DIAGNOSIS — N183 Chronic kidney disease, stage 3 unspecified: Secondary | ICD-10-CM | POA: Diagnosis not present

## 2020-07-09 DIAGNOSIS — M311 Thrombotic microangiopathy: Secondary | ICD-10-CM | POA: Diagnosis not present

## 2020-07-09 LAB — CBC WITH DIFFERENTIAL/PLATELET
Abs Immature Granulocytes: 0.3 10*3/uL — ABNORMAL HIGH (ref 0.00–0.07)
Band Neutrophils: 7 %
Basophils Absolute: 0 10*3/uL (ref 0.0–0.1)
Basophils Relative: 0 %
Eosinophils Absolute: 0.3 10*3/uL (ref 0.0–0.5)
Eosinophils Relative: 1 %
HCT: 26.5 % — ABNORMAL LOW (ref 39.0–52.0)
Hemoglobin: 9 g/dL — ABNORMAL LOW (ref 13.0–17.0)
Lymphocytes Relative: 1 %
Lymphs Abs: 0.3 10*3/uL — ABNORMAL LOW (ref 0.7–4.0)
MCH: 32 pg (ref 26.0–34.0)
MCHC: 34 g/dL (ref 30.0–36.0)
MCV: 94.3 fL (ref 80.0–100.0)
Metamyelocytes Relative: 1 %
Monocytes Absolute: 1.4 10*3/uL — ABNORMAL HIGH (ref 0.1–1.0)
Monocytes Relative: 4 %
Neutro Abs: 31.4 10*3/uL — ABNORMAL HIGH (ref 1.7–7.7)
Neutrophils Relative %: 86 %
Platelets: 102 10*3/uL — ABNORMAL LOW (ref 150–400)
RBC: 2.81 MIL/uL — ABNORMAL LOW (ref 4.22–5.81)
RDW: 15.9 % — ABNORMAL HIGH (ref 11.5–15.5)
Smear Review: DECREASED
WBC: 33.8 10*3/uL — ABNORMAL HIGH (ref 4.0–10.5)
nRBC: 0 % (ref 0.0–0.2)

## 2020-07-09 LAB — COMPREHENSIVE METABOLIC PANEL
ALT: 312 U/L — ABNORMAL HIGH (ref 0–44)
AST: 388 U/L — ABNORMAL HIGH (ref 15–41)
Albumin: 2.6 g/dL — ABNORMAL LOW (ref 3.5–5.0)
Alkaline Phosphatase: 1650 U/L — ABNORMAL HIGH (ref 38–126)
Anion gap: 15 (ref 5–15)
BUN: 96 mg/dL — ABNORMAL HIGH (ref 8–23)
CO2: 12 mmol/L — ABNORMAL LOW (ref 22–32)
Calcium: 7.1 mg/dL — ABNORMAL LOW (ref 8.9–10.3)
Chloride: 104 mmol/L (ref 98–111)
Creatinine, Ser: 6.05 mg/dL — ABNORMAL HIGH (ref 0.61–1.24)
GFR calc Af Amer: 10 mL/min — ABNORMAL LOW (ref >60)
GFR calc non Af Amer: 8 mL/min — ABNORMAL LOW (ref >60)
Glucose, Bld: 128 mg/dL — ABNORMAL HIGH (ref 70–99)
Potassium: 4.8 mmol/L (ref 3.5–5.1)
Sodium: 131 mmol/L — ABNORMAL LOW (ref 135–145)
Total Bilirubin: 6.7 mg/dL — ABNORMAL HIGH (ref 0.3–1.2)
Total Protein: 5.7 g/dL — ABNORMAL LOW (ref 6.5–8.1)

## 2020-07-09 NOTE — Progress Notes (Signed)
Hematology/Oncology Consult note [facility]  [phone] [phone]  Patient Care Team: [name], MD as PCP - General (Cardiology) [name], MD as PCP - Cardiology (Cardiology) [name], MD as Consulting Physician (Cardiology) Clent Jacks, RN as Registered Nurse   Name of the patient: [name]  [id number]  01-17-1946   Date of visit: 07/09/20  Diagnosis- 1.  Metastatic small bowel carcinoid 2.  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 In January 2019 patient was also noted to have 2.2 x 1.7 cm mass in the pancreatic head without hypermetabolism on dotatate PET scan. This was followed by EUS and FNA of the pancreatic head mass which showed:DIAGNOSIS:  A. PANCREATIC HEAD MASS; ENDOSCOPIC ULTRASOUND GUIDED FNA:  - POSITIVE FOR MALIGNANCY.  - NON-DUCTAL NEOPLASM PRESENT, SEE COMMENT.Based on cytologic features differential diagnosis includes neuroendocrine tumor and asthenic cell carcinoma. Case sent to [facility] for second opinion who also favored the diagnosis of neuroendocrine tumor noting that acinar cell carcinoma cannot be excluded  Patient was referred to Dr. 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 He also underwent ERCP which showed severe localized biliary stricture and underwent sphincterotomy with balloon sphincteroplasty and brushings again showed  inconclusive diagnosis-neuroendocrine tumor versus acinar cell carcinoma or less likely adenocarcinoma.  Patient was also seen by Dr. Leamon Arnt at [facility] in June 2021 and since patient was not deemed to be a surgical candidate at that time trial of octreotide was recommended.  Shortly following that patient was admitted to the hospital with symptoms of acute cholangitisAnd underwent a repeat ERCP by Dr. Allen Norris during which the prior occluded CBD stent was taken out and a plastic stent was placed into the common bile duct.  Hospital course was complicated by septic shock requiring intubation and pressors but patient eventually recovered and [facility].  Scans at that time showed worsening tumor with enlarging adrenal involvement on the left side increasing soft tissue and ductal dilatation.  Interval history- he is living with his friend who helps him with ADL's. One of his sisters is with him today. Patient reports chronic fatigue. Also has abdominal pain for which he takes prn oxycodone. Denies any fever  ECOG PS- 2-3 Pain scale- 0 Opioid associated constipation- no  Review of systems- Review of Systems  Constitutional: Positive for malaise/fatigue. Negative for chills, fever and weight loss.  HENT: Negative for congestion, ear discharge and nosebleeds.   Eyes: Negative for blurred vision.  Respiratory: Negative for cough, hemoptysis, sputum production, shortness of breath and wheezing.   Cardiovascular: Negative for chest pain, palpitations, orthopnea and claudication.  Gastrointestinal: Positive for abdominal pain. Negative for blood in stool, constipation, diarrhea, heartburn, melena, nausea and vomiting.  Genitourinary: Negative for dysuria, flank pain, frequency, hematuria and urgency.  Musculoskeletal: Negative for back pain, joint pain and myalgias.  Skin: Negative for rash.  Neurological: Negative for dizziness, tingling, focal weakness, seizures, weakness and headaches.   Endo/Heme/Allergies: Does  not bruise/bleed easily.  Psychiatric/Behavioral: Negative for depression and suicidal ideas. The patient does not have insomnia.       Allergies  Allergen Reactions  . Levaquin [Levofloxacin In D5w] Swelling and Rash     Past Medical History:  Diagnosis Date  . Aortic atherosclerosis ([place])    As seen on 04/2019 CT  . Atrial flutter ([place])    a. s/p ablation 2017/2018 and DCCV 2015/2016/2019 b. 2017 echo EF 60-65%, no RWMA, mild MR,  . CAD (coronary artery disease)    3v CAD 04/2019 CT  . Cancer ([place])   . Carcinoid tumor    metastatic  . CKD (chronic kidney disease), stage III   . COPD (chronic obstructive pulmonary disease) (H[facility])    PM oxygen  . Current occasional smoker    1-2 cigarettes / week  . Diabetes mellitus type 2, insulin dependent ([place])   . Heart failure with preserved ejection fraction ([place])    2017 echo EF 60-65%, no RWMA, mild MR,    . Hyperlipidemia   . Hypertension   . Pancreatitis   . Polycythemia      Past Surgical History:  Procedure Laterality Date  . A-FLUTTER ABLATION N/A 10/14/2017   Procedure: A-FLUTTER ABLATION;  Surgeon: Constance Haw, MD;  Location: [place] [facility];  Service: Cardiovascular;  Laterality: N/A;  . CARDIOVERSION N/A 09/08/2017   Procedure: CARDIOVERSION;  Surgeon: [name], MD;  Location: [facility] ORS;  Service: Cardiovascular;  Laterality: N/A;  . CARDIOVERSION N/A 03/22/2018   Procedure: CARDIOVERSION;  Surgeon: [name], MD;  Location: [facility] OR[facility];  Service: Cardiovascular;  Laterality: N/A;  . COLONOSCOPY    . COLONOSCOPY WITH PROPOFOL N/A 10/31/2019   Procedure: COLONOSCOPY WITH PROPOFOL;  Surgeon: Lin Landsman, MD;  [facility] ENDOSCOPY;  Service: Gastroenterology;  Laterality: N/A;  . ELECTROPHYSIOLOGIC STUDY N/A 10/27/2016   Procedure: A-Flutter Ablation;  Surgeon: Deboraha Sprang, MD;  Location: [place] [facility];  Service: Cardiovascular;  Laterality: N/A;  .  ENDOSCOPIC RETROGRADE CHOLANGIOPANCREATOGRAPHY (ERCP) WITH PROPOFOL N/A 05/21/2020   Procedure: ENDOSCOPIC RETROGRADE CHOLANGIOPANCREATOGRAPHY (ERCP) WITH PROPOFOL;  Surgeon: [name], MD;  Location: [facility] ENDOSCOPY;  Service: Endoscopy;  Laterality: N/A;  . EUS N/A 12/22/2017   Procedure: FULL UPPER ENDOSCOPIC ULTRASOUND (EUS) RADIAL;  Surgeon: Reita Cliche, MD;  Location: [facility] ENDOSCOPY;  Service: Gastroenterology;  Laterality: N/A;  . TEE WITH CARDIOVERSION  12/16  . TEMPORARY DIALYSIS CATHETER N/A 02/08/2020   Procedure: TEMPORARY DIALYSIS CATHETER;  Surgeon: Algernon Huxley, MD;  Location: [place] [facility];  Service: Cardiovascular;  Laterality: N/A;    Social History   Socioeconomic History  . Marital status: Widowed    Spouse name: Not on file  . Number of children: Not on file  . Years of education: Not on file  . Highest education level: Not on file  Occupational History  . Not on file  Tobacco Use  . Smoking status: Former Smoker    Years: 45.00    Types: Cigarettes    Quit date: 02/02/2020    Years since quitting: 0.4  . Smokeless tobacco: Never Used  Vaping Use  . Vaping Use: Never used  Substance and Sexual Activity  . Alcohol use: Not Currently  . Drug use: No  . Sexual activity: Not Currently  Other Topics Concern  . Not on file  Social History Narrative  . Not on file   Social Determinants of Health   Financial Resource Strain:   .  Difficulty of Paying Living Expenses: Not on file  Food Insecurity:   . Worried About Charity fundraiser in the Last Year: Not on file  . Ran Out of Food in the Last Year: Not on file  Transportation Needs:   . Lack of Transportation (Medical): Not on file  . Lack of Transportation (Non-Medical): Not on file  Physical Activity:   . Days of Exercise per Week: Not on file  . Minutes of Exercise per Session: Not on file  Stress:   . Feeling of Stress : Not on file  Social Connections:   . Frequency of Communication with  Friends and Family: Not on file  . Frequency of Social Gatherings with Friends and Family: Not on file  . Attends Religious Services: Not on file  . Active Member of Clubs or Organizations: Not on file  . Attends Archivist Meetings: Not on file  . Marital Status: Not on file  Intimate Partner Violence:   . Fear of Current or Ex-Partner: Not on file  . Emotionally Abused: Not on file  . Physically Abused: Not on file  . Sexually Abused: Not on file    Family History  Problem Relation Age of Onset  . Hypertension Father      Current Outpatient Medications:  .  acetaminophen (TYLENOL) 500 MG tablet, Take 500-1,000 mg by mouth every 6 (six) hours as needed for moderate pain or headache. , Disp: , Rfl:  .  apixaban (ELIQUIS) 5 MG TABS tablet, Take 1 tablet (5 mg total) by mouth 2 (two) times daily., Disp: 60 tablet, Rfl: 6 .  docusate sodium (COLACE) 100 MG capsule, Take 100 mg by mouth daily as needed for mild constipation., Disp: , Rfl:  .  Infant Care Products [facility] EX), Apply 1 application topically as needed (with every incontence)., Disp: , Rfl:  .  insulin glargine (LANTUS) 100 UNIT/ML injection, Inject 0.12 mLs (12 Units total) into the skin at bedtime. (Patient taking differently: Inject 22 Units into the skin at bedtime. ), Disp: 10 mL, Rfl: 11 .  levothyroxine (SYNTHROID) 50 MCG tablet, Take 1 tablet (50 mcg total) by mouth daily before breakfast., Disp: 30 tablet, Rfl: 6 .  metoprolol succinate (TOPROL XL) 25 MG 24 hr tablet, Take 0.5 tablets (12.5 mg total) by mouth daily. Hold for sbp < 100 or HR < 60., Disp: 30 tablet, Rfl: 6 .  midodrine (PROAMATINE) 10 MG tablet, Take 1 tablet (10 mg total) by mouth 3 (three) times daily., Disp: 90 tablet, Rfl: 3 .  oxyCODONE (OXY IR/ROXICODONE) 5 MG immediate release tablet, Take 1 tablet (5 mg total) by mouth every 12 (twelve) hours as needed for severe pain., Disp: 60 tablet, Rfl: 0 .  pantoprazole (PROTONIX) 40 MG tablet,  Take 1 tablet (40 mg total) by mouth daily., Disp: 30 tablet, Rfl: 11 .  rosuvastatin (CRESTOR) 10 MG tablet, Take 1 tablet (10 mg total) by mouth at bedtime., Disp: 90 tablet, Rfl: 3 No current facility-administered medications for this visit.  Facility-Administered Medications Ordered in Other Visits:  .  octreotide (SANDOSTATIN LAR) IM injection 30 mg, 30 mg, Intramuscular, Q30 days, Sindy Guadeloupe, MD, 30 mg at 07/04/20 1500  Physical exam:  Vitals:   07/04/20 1407  BP: (!) 115/59  Pulse: (!) 44  Resp: 16  Temp: (!) 96.2 F (35.7 C)  TempSrc: Tympanic  SpO2: 100%  Weight: 144 lb 8 oz (65.5 kg)   Physical Exam Constitutional:  Comments: Frail elderly gentleman sitting in a wheelchair. Appears in no acute distress  HENT:     Head: Normocephalic and atraumatic.  Eyes:     Pupils: Pupils are equal, round, and reactive to light.  Cardiovascular:     Rate and Rhythm: Normal rate and regular rhythm.     Heart sounds: Normal heart sounds.  Pulmonary:     Effort: Pulmonary effort is normal.     Breath sounds: Normal breath sounds.  Abdominal:     General: Bowel sounds are normal.     Palpations: Abdomen is soft.     Comments: Mild diffuse TTP  Musculoskeletal:     Cervical back: Normal range of motion.  Skin:    General: Skin is warm and dry.  Neurological:     Mental Status: He is alert and oriented to person, place, and time.          Assessment and plan- Patient is a 74 y.o. male  with known neuroendocrine tumor of the mesentery and pancreatic head mass (neuroendocrine versus acinar cell carcinoma).This is a routine f/u visit for metastatic neuroendocrine tumor  1. Pancreatic head mass: acinar versus neuroendocrine tumor. Complicated by recurrent episodes of obctructive jaundice and recent cholangitis. Patient is afebrile today. He has chronically elevated AST/ALT and alk phos. But total bilirubin is normal. He has a plastic stent in place which at some point  needs to be exchanged but he is a high risk for undergoing any procedure. If he has worsening abdominal pain, symptoms of cholangitis- he will need to be admitted for possible ERCP  Patient has received 1 dose of octreotide so far. He missed his second dose as he was hospitalized. Ok to get second dose today.  2. CKD: baseline was 2.4. now up to 3.4-3.9 since recent hospitalization  3. Anemia- likely due to CKD. Stable continue to monitor  I will see him back in 1  Month with cbc cmp and chromogranin A   Visit Diagnosis 1. Malignant carcinoid tumor of small intestine, unspecified location ([place])   2. Malignant neoplasm of head of pancreas ([place])   3. Encounter for monitoring octreotide therapy   4. Anemia in chronic kidney disease, unspecified CKD stage      Dr. Randa Evens, MD, MPH [facility] [phone] 07/09/2020 10:16 AM

## 2020-07-09 NOTE — Progress Notes (Signed)
Symptom Management Consult note [facility]  [phone] [phone]  Patient Care Team: [name], MD as PCP - General (Cardiology) [name], MD as PCP - Cardiology (Cardiology) [name], MD as Consulting Physician (Cardiology) Clent Jacks, RN as Registered Nurse   Name of the patient: [name]  [id number]  May 04, 1946   Date of visit: 07/09/2020   Diagnosis- There are no diagnoses linked to this encounter.   Chief complaint/ Reason for visit- abdominal pain   Heme/Onc history:  Oncology History   No history exists.   Interval history-[name] is a 74 year old male with past medical history of small bowel carcinoid who has been monitored since 2018 without significant progression.  He is followed by Dr. Mariah Milling at [facility] and was seen back in December 2020 revealing a stable pancreatic mass surgery not recommended.  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  Underwent ERCP which showed localized biliary stricture.  Had sphincterotomy with balloon sphincteroplasty and brushings which were inconclusive.   He was also seen by Dr. Leamon Arnt at [facility] in June 2021 who recommended a trial of octreotide.  Admitted shortly thereafter for acute cholangeitis.  Had a repeat ERCP by Dr. Verl Blalock which showed occluded CBD stent and a plastic stent was placed in the common bile duct.  Hospital course was complicated by septic shock requiring intubation and pressors that patient eventually recovered and [facility].  Repeat imaging at that time showed worsening tumor with enlarging adrenal involvement in the left side increasing soft tissue and ductal dilatation.  He was seen by Dr. Janese Banks on 07/04/2020 for follow-up where he reported feeling improved with chronic fatigue and chronic abdominal pain.  They discussed his plastic  stent which needed to be exchanged at some point but he has high risk given previous hospitalizations.  If he has worsening abdominal pain or symptoms of cholangitis he will need to be admitted for possible repeat ERCP.  He was given a second dose of octreotide.  Noted creatinine 3.4-3.9 since most recent hospitalization.  He was scheduled to return to clinic in 1 month.  Today, he presents for abdominal pain, vomiting and chills x2 days.  Given complicated history, he was asked to be seen with imaging.  Unfortunately, unable to tolerate oral contrast or positioning at this time.  He was brought in for further evaluation.  ECOG FS:2 - Symptomatic, <50% confined to bed  Review of systems- Review of Systems  Constitutional: Positive for malaise/fatigue. Negative for chills, fever and weight loss.  HENT: Negative for congestion, ear pain and tinnitus.   Eyes: Negative.  Negative for blurred vision and double vision.  Respiratory: Negative.  Negative for cough, sputum production and shortness of breath.   Cardiovascular: Negative.  Negative for chest pain, palpitations and leg swelling.  Gastrointestinal: Positive for abdominal pain and nausea. Negative for constipation, diarrhea and vomiting.  Genitourinary: Negative for dysuria, frequency and urgency.  Musculoskeletal: Negative for back pain and falls.  Skin: Negative.  Negative for rash.  Neurological: Negative.  Negative for weakness and headaches.  Endo/Heme/Allergies: Negative.  Does not bruise/bleed easily.  Psychiatric/Behavioral: Negative.  Negative for depression. The patient is not nervous/anxious and does not have insomnia.      Current treatment-status post trial of octreotide.  Allergies  Allergen Reactions   Levaquin [Levofloxacin In D5w] Swelling and Rash  Past Medical History:  Diagnosis Date   Aortic atherosclerosis ([place])    As seen on 04/2019 CT   Atrial flutter ([place])    a. s/p ablation 2017/2018 and DCCV  2015/2016/2019 b. 2017 echo EF 60-65%, no RWMA, mild MR,   CAD (coronary artery disease)    3v CAD 04/2019 CT   Cancer [facility])    Carcinoid tumor    metastatic   CKD (chronic kidney disease), stage III    COPD (chronic obstructive pulmonary disease) ([place])    PM oxygen   Current occasional smoker    1-2 cigarettes / week   Diabetes mellitus type 2, insulin dependent ([place])    Heart failure with preserved ejection fraction ([place])    2017 echo EF 60-65%, no RWMA, mild MR,     Hyperlipidemia    Hypertension    Pancreatitis    Polycythemia      Past Surgical History:  Procedure Laterality Date   A-FLUTTER ABLATION N/A 10/14/2017   Procedure: A-FLUTTER ABLATION;  Surgeon: Constance Haw, MD;  Location: [place] [facility];  Service: Cardiovascular;  Laterality: N/A;   CARDIOVERSION N/A 09/08/2017   Procedure: CARDIOVERSION;  Surgeon: [name], MD;  Location: [place] [facility];  Service: Cardiovascular;  Laterality: N/A;   CARDIOVERSION N/A 03/22/2018   Procedure: CARDIOVERSION;  Surgeon: [name], MD;  Location: [facility] OR[facility];  Service: Cardiovascular;  Laterality: N/A;   COLONOSCOPY     COLONOSCOPY WITH PROPOFOL N/A 10/31/2019   Procedure: COLONOSCOPY WITH PROPOFOL;  Surgeon: Lin Landsman, MD;  Location: [facility] ENDOSCOPY;  Service: Gastroenterology;  Laterality: N/A;   ELECTROPHYSIOLOGIC STUDY N/A 10/27/2016   Procedure: A-Flutter Ablation;  Surgeon: Deboraha Sprang, MD;  Location: [place] [facility];  Service: Cardiovascular;  Laterality: N/A;   ENDOSCOPIC RETROGRADE CHOLANGIOPANCREATOGRAPHY (ERCP) WITH PROPOFOL N/A 05/21/2020   Procedure: ENDOSCOPIC RETROGRADE CHOLANGIOPANCREATOGRAPHY (ERCP) WITH PROPOFOL;  Surgeon: [name], MD;  Location: [facility] ENDOSCOPY;  Service: Endoscopy;  Laterality: N/A;   EUS N/A 12/22/2017   Procedure: FULL UPPER ENDOSCOPIC ULTRASOUND (EUS) RADIAL;  Surgeon: Reita Cliche, MD;  Location: [facility] ENDOSCOPY;  Service:  Gastroenterology;  Laterality: N/A;   TEE WITH CARDIOVERSION  12/16   TEMPORARY DIALYSIS CATHETER N/A 02/08/2020   Procedure: TEMPORARY DIALYSIS CATHETER;  Surgeon: Algernon Huxley, MD;  Location: [place] [facility];  Service: Cardiovascular;  Laterality: N/A;    Social History   Socioeconomic History   Marital status: Widowed    Spouse name: Not on file   Number of children: Not on file   Years of education: Not on file   Highest education level: Not on file  Occupational History   Not on file  Tobacco Use   Smoking status: Former Smoker    Years: 45.00    Types: Cigarettes    Quit date: 02/02/2020    Years since quitting: 0.4   Smokeless tobacco: Never Used  Vaping Use   Vaping Use: Never used  Substance and Sexual Activity   Alcohol use: Not Currently   Drug use: No   Sexual activity: Not Currently  Other Topics Concern   Not on file  Social History Narrative   Not on file   Social Determinants of Health   Financial Resource Strain:    Difficulty of Paying Living Expenses: Not on file  Food Insecurity:    Worried About [place] in the Last Year: Not on file   [place] in the Last Year:  Not on file  Transportation Needs:    Lack of Transportation (Medical): Not on file   Lack of Transportation (Non-Medical): Not on file  Physical Activity:    Days of Exercise per Week: Not on file   Minutes of Exercise per Session: Not on file  Stress:    Feeling of Stress : Not on file  Social Connections:    Frequency of Communication with Friends and Family: Not on file   Frequency of Social Gatherings with Friends and Family: Not on file   Attends Religious Services: Not on file   Active Member of [place] or Organizations: Not on file   Attends Archivist Meetings: Not on file   Marital Status: Not on file  Intimate Partner Violence:    Fear of Current or Ex-Partner: Not on file   Emotionally Abused: Not on file    Physically Abused: Not on file   Sexually Abused: Not on file    Family History  Problem Relation Age of Onset   Hypertension Father      Current Outpatient Medications:    acetaminophen (TYLENOL) 500 MG tablet, Take 500-1,000 mg by mouth every 6 (six) hours as needed for moderate pain or headache. , Disp: , Rfl:    apixaban (ELIQUIS) 5 MG TABS tablet, Take 1 tablet (5 mg total) by mouth 2 (two) times daily., Disp: 60 tablet, Rfl: 6   docusate sodium (COLACE) 100 MG capsule, Take 100 mg by mouth daily as needed for mild constipation., Disp: , Rfl:    Infant Care Products (DERMACLOUD EX), Apply 1 application topically as needed (with every incontence)., Disp: , Rfl:    insulin glargine (LANTUS) 100 UNIT/ML injection, Inject 0.12 mLs (12 Units total) into the skin at bedtime. (Patient taking differently: Inject 22 Units into the skin at bedtime. ), Disp: 10 mL, Rfl: 11   levothyroxine (SYNTHROID) 50 MCG tablet, Take 1 tablet (50 mcg total) by mouth daily before breakfast., Disp: 30 tablet, Rfl: 6   metoprolol succinate (TOPROL XL) 25 MG 24 hr tablet, Take 0.5 tablets (12.5 mg total) by mouth daily. Hold for sbp < 100 or HR < 60., Disp: 30 tablet, Rfl: 6   midodrine (PROAMATINE) 10 MG tablet, Take 1 tablet (10 mg total) by mouth 3 (three) times daily., Disp: 90 tablet, Rfl: 3   oxyCODONE (OXY IR/ROXICODONE) 5 MG immediate release tablet, Take 1 tablet (5 mg total) by mouth every 12 (twelve) hours as needed for severe pain., Disp: 60 tablet, Rfl: 0   pantoprazole (PROTONIX) 40 MG tablet, Take 1 tablet (40 mg total) by mouth daily., Disp: 30 tablet, Rfl: 11   rosuvastatin (CRESTOR) 10 MG tablet, Take 1 tablet (10 mg total) by mouth at bedtime., Disp: 90 tablet, Rfl: 3 No current facility-administered medications for this visit.  Facility-Administered Medications Ordered in Other Visits:    octreotide (SANDOSTATIN LAR) IM injection 30 mg, 30 mg, Intramuscular, Q30 days, Sindy Guadeloupe, MD, 30 mg at 07/04/20 1500  Physical exam:  Vitals:   07/09/20 0918  BP: (!) 89/45  Pulse: (!) 56  Resp: 18  Temp: (!) 97.3 F (36.3 C)  TempSrc: Tympanic  Weight: 147 lb (66.7 kg)   Physical Exam Constitutional:      Appearance: Normal appearance.  HENT:     Head: Normocephalic and atraumatic.  Eyes:     Pupils: Pupils are equal, round, and reactive to light.  Cardiovascular:     Rate and Rhythm: Normal rate and  regular rhythm.     Heart sounds: Normal heart sounds. No murmur heard.   Pulmonary:     Effort: Pulmonary effort is normal.     Breath sounds: Normal breath sounds. No wheezing.  Abdominal:     General: Bowel sounds are normal. There is no distension.     Palpations: Abdomen is soft.     Tenderness: There is no abdominal tenderness.  Musculoskeletal:        General: Normal range of motion.     Cervical back: Normal range of motion.  Skin:    General: Skin is warm and dry.     Findings: No rash.  Neurological:     Mental Status: He is alert and oriented to person, place, and time.  Psychiatric:        Judgment: Judgment normal.      CMP Latest Ref Rng & Units 07/09/2020  Glucose 70 - 99 mg/dL 128(H)  BUN 8 - 23 mg/dL 96(H)  Creatinine 0.61 - 1.24 mg/dL 6.05(H)  Sodium 135 - 145 mmol/L 131(L)  Potassium 3.5 - 5.1 mmol/L 4.8  Chloride 98 - 111 mmol/L 104  CO2 22 - 32 mmol/L 12(L)  Calcium 8.9 - 10.3 mg/dL 7.1(L)  Total Protein 6.5 - 8.1 g/dL 5.7(L)  Total Bilirubin 0.3 - 1.2 mg/dL 6.7(H)  Alkaline Phos 38 - 126 U/L 1,650(H)  AST 15 - 41 U/L 388(H)  ALT 0 - 44 U/L 312(H)   CBC Latest Ref Rng & Units 07/09/2020  WBC 4.0 - 10.5 K/uL 33.8(H)  Hemoglobin 13.0 - 17.0 g/dL 9.0(L)  Hematocrit 39 - 52 % 26.5(L)  Platelets 150 - 400 K/uL 102(L)    No images are attached to the encounter.  No results found. Assessment and plan- Patient is a 74 y.o. male who presented to symptom management for concerns of abdominal pain, nausea and vomiting.  Symptoms  present for 2 days.  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  He is status post several hospitalizations secondary to biliary obstruction with stent placement.  Most recently had a plastic stent placed due to a second blockage.  Course was complicated by sepsis.  Started on a trial of octreotide.  Abdominal pain/nausea/vomiting-lab work show acute renal failure with significant elevation of both BUN and creatinine along with elevated bilirubin.  Spoke with Dr. Janese Banks who recommends patient be seen at [facility] ED.  Patient's daughter taking patient to emergency room d/t concerns for biliary obstruction.   Plan- Lab work-acute renal failure, elevated bilirubin Patient to be taken directly to [facility].  Disposition- [place] emergency room.  Dr. Janese Banks notified.  Visit Diagnosis 1. Malignant carcinoid tumor of small intestine, unspecified location ([place])   2. Acute renal failure, unspecified acute renal failure type [facility])     Patient expressed understanding and was in agreement with this plan. He also understands that He can call clinic at any time with any questions, concerns, or complaints.   Greater than 50% was spent in counseling and coordination of care with this patient including but not limited to discussion of the relevant topics above (See A&P) including, but not limited to diagnosis and management of acute and chronic medical conditions.   Thank you for allowing me to participate in the care of this very pleasant patient.    Jacquelin Hawking, NP [place] [facility] Cell - [phone] Pager- [phone] 07/25/2020 10:14 AM

## 2020-07-09 NOTE — Progress Notes (Signed)
Acute add on for bouts of abd pain, 1st bout on Sunday . No N/V on Sunday. Yesterday he had severe pain , vomiting and chills. No pain or chills today so far. BP extremely low this am. Took Oxycodone with relief. Eyes are jaundiced.

## 2020-07-10 DIAGNOSIS — K838 Other specified diseases of biliary tract: Secondary | ICD-10-CM | POA: Diagnosis not present

## 2020-07-10 DIAGNOSIS — K8309 Other cholangitis: Secondary | ICD-10-CM | POA: Diagnosis not present

## 2020-07-10 DIAGNOSIS — T85590A Other mechanical complication of bile duct prosthesis, initial encounter: Secondary | ICD-10-CM | POA: Diagnosis not present

## 2020-07-10 DIAGNOSIS — K831 Obstruction of bile duct: Secondary | ICD-10-CM | POA: Diagnosis not present

## 2020-07-10 DIAGNOSIS — K228 Other specified diseases of esophagus: Secondary | ICD-10-CM | POA: Diagnosis not present

## 2020-07-11 DIAGNOSIS — R6521 Severe sepsis with septic shock: Secondary | ICD-10-CM | POA: Diagnosis not present

## 2020-07-11 DIAGNOSIS — A419 Sepsis, unspecified organism: Secondary | ICD-10-CM | POA: Diagnosis not present

## 2020-07-11 DIAGNOSIS — K8309 Other cholangitis: Secondary | ICD-10-CM | POA: Diagnosis not present

## 2020-07-11 DIAGNOSIS — K831 Obstruction of bile duct: Secondary | ICD-10-CM | POA: Diagnosis not present

## 2020-07-11 DIAGNOSIS — K838 Other specified diseases of biliary tract: Secondary | ICD-10-CM | POA: Diagnosis not present

## 2020-07-11 DIAGNOSIS — N179 Acute kidney failure, unspecified: Secondary | ICD-10-CM | POA: Diagnosis not present

## 2020-07-11 DIAGNOSIS — C250 Malignant neoplasm of head of pancreas: Secondary | ICD-10-CM | POA: Diagnosis not present

## 2020-07-12 DIAGNOSIS — E039 Hypothyroidism, unspecified: Secondary | ICD-10-CM | POA: Diagnosis not present

## 2020-07-12 DIAGNOSIS — K831 Obstruction of bile duct: Secondary | ICD-10-CM | POA: Diagnosis not present

## 2020-07-12 DIAGNOSIS — E119 Type 2 diabetes mellitus without complications: Secondary | ICD-10-CM | POA: Diagnosis not present

## 2020-07-12 DIAGNOSIS — R1312 Dysphagia, oropharyngeal phase: Secondary | ICD-10-CM | POA: Diagnosis not present

## 2020-07-12 DIAGNOSIS — K8309 Other cholangitis: Secondary | ICD-10-CM | POA: Diagnosis not present

## 2020-07-12 DIAGNOSIS — C7A00 Malignant carcinoid tumor of unspecified site: Secondary | ICD-10-CM | POA: Diagnosis not present

## 2020-07-13 DIAGNOSIS — N179 Acute kidney failure, unspecified: Secondary | ICD-10-CM | POA: Diagnosis not present

## 2020-07-14 DIAGNOSIS — G893 Neoplasm related pain (acute) (chronic): Secondary | ICD-10-CM | POA: Diagnosis not present

## 2020-07-14 DIAGNOSIS — C259 Malignant neoplasm of pancreas, unspecified: Secondary | ICD-10-CM | POA: Diagnosis not present

## 2020-07-14 DIAGNOSIS — Z515 Encounter for palliative care: Secondary | ICD-10-CM | POA: Diagnosis not present

## 2020-07-15 ENCOUNTER — Other Ambulatory Visit: Payer: Self-pay

## 2020-07-15 ENCOUNTER — Inpatient Hospital Stay: Payer: Medicare Other | Admitting: Oncology

## 2020-07-15 ENCOUNTER — Inpatient Hospital Stay: Payer: Medicare Other

## 2020-07-15 DIAGNOSIS — C259 Malignant neoplasm of pancreas, unspecified: Secondary | ICD-10-CM | POA: Diagnosis not present

## 2020-07-15 DIAGNOSIS — G893 Neoplasm related pain (acute) (chronic): Secondary | ICD-10-CM | POA: Diagnosis not present

## 2020-07-15 DIAGNOSIS — Z515 Encounter for palliative care: Secondary | ICD-10-CM | POA: Diagnosis not present

## 2020-07-15 DIAGNOSIS — R1312 Dysphagia, oropharyngeal phase: Secondary | ICD-10-CM | POA: Diagnosis not present

## 2020-07-16 DIAGNOSIS — A419 Sepsis, unspecified organism: Secondary | ICD-10-CM | POA: Diagnosis not present

## 2020-07-16 DIAGNOSIS — K8309 Other cholangitis: Secondary | ICD-10-CM | POA: Diagnosis not present

## 2020-07-16 DIAGNOSIS — C259 Malignant neoplasm of pancreas, unspecified: Secondary | ICD-10-CM | POA: Diagnosis not present

## 2020-07-16 DIAGNOSIS — Z515 Encounter for palliative care: Secondary | ICD-10-CM | POA: Diagnosis not present

## 2020-07-16 DIAGNOSIS — G893 Neoplasm related pain (acute) (chronic): Secondary | ICD-10-CM | POA: Diagnosis not present

## 2020-07-16 DIAGNOSIS — K831 Obstruction of bile duct: Secondary | ICD-10-CM | POA: Diagnosis not present

## 2020-07-16 DIAGNOSIS — R6521 Severe sepsis with septic shock: Secondary | ICD-10-CM | POA: Diagnosis not present

## 2020-07-17 DIAGNOSIS — Z515 Encounter for palliative care: Secondary | ICD-10-CM | POA: Diagnosis not present

## 2020-07-17 DIAGNOSIS — G893 Neoplasm related pain (acute) (chronic): Secondary | ICD-10-CM | POA: Diagnosis not present

## 2020-07-17 DIAGNOSIS — R6521 Severe sepsis with septic shock: Secondary | ICD-10-CM | POA: Diagnosis not present

## 2020-07-17 DIAGNOSIS — C259 Malignant neoplasm of pancreas, unspecified: Secondary | ICD-10-CM | POA: Diagnosis not present

## 2020-07-17 DIAGNOSIS — K8309 Other cholangitis: Secondary | ICD-10-CM | POA: Diagnosis not present

## 2020-07-17 DIAGNOSIS — K831 Obstruction of bile duct: Secondary | ICD-10-CM | POA: Diagnosis not present

## 2020-07-17 DIAGNOSIS — A419 Sepsis, unspecified organism: Secondary | ICD-10-CM | POA: Diagnosis not present

## 2020-07-18 DIAGNOSIS — R6521 Severe sepsis with septic shock: Secondary | ICD-10-CM | POA: Diagnosis not present

## 2020-07-18 DIAGNOSIS — K8309 Other cholangitis: Secondary | ICD-10-CM | POA: Diagnosis not present

## 2020-07-18 DIAGNOSIS — A419 Sepsis, unspecified organism: Secondary | ICD-10-CM | POA: Diagnosis not present

## 2020-07-18 DIAGNOSIS — K831 Obstruction of bile duct: Secondary | ICD-10-CM | POA: Diagnosis not present

## 2020-08-07 ENCOUNTER — Ambulatory Visit: Payer: Medicare Other

## 2020-08-07 ENCOUNTER — Ambulatory Visit: Payer: Medicare Other | Admitting: Oncology

## 2020-08-07 ENCOUNTER — Other Ambulatory Visit: Payer: Medicare Other

## 2020-08-12 ENCOUNTER — Ambulatory Visit: Payer: Medicare Other | Admitting: Physician Assistant

## 2020-08-15 DEATH — deceased

## 2021-03-29 IMAGING — DX DG CHEST 1V PORT
1 series · 1 of 1 positions shown · non-contrast
Comparison: December 03, 2019

CLINICAL DATA: Central line placement.

EXAM:
PORTABLE CHEST 1 VIEW

[chest ap]
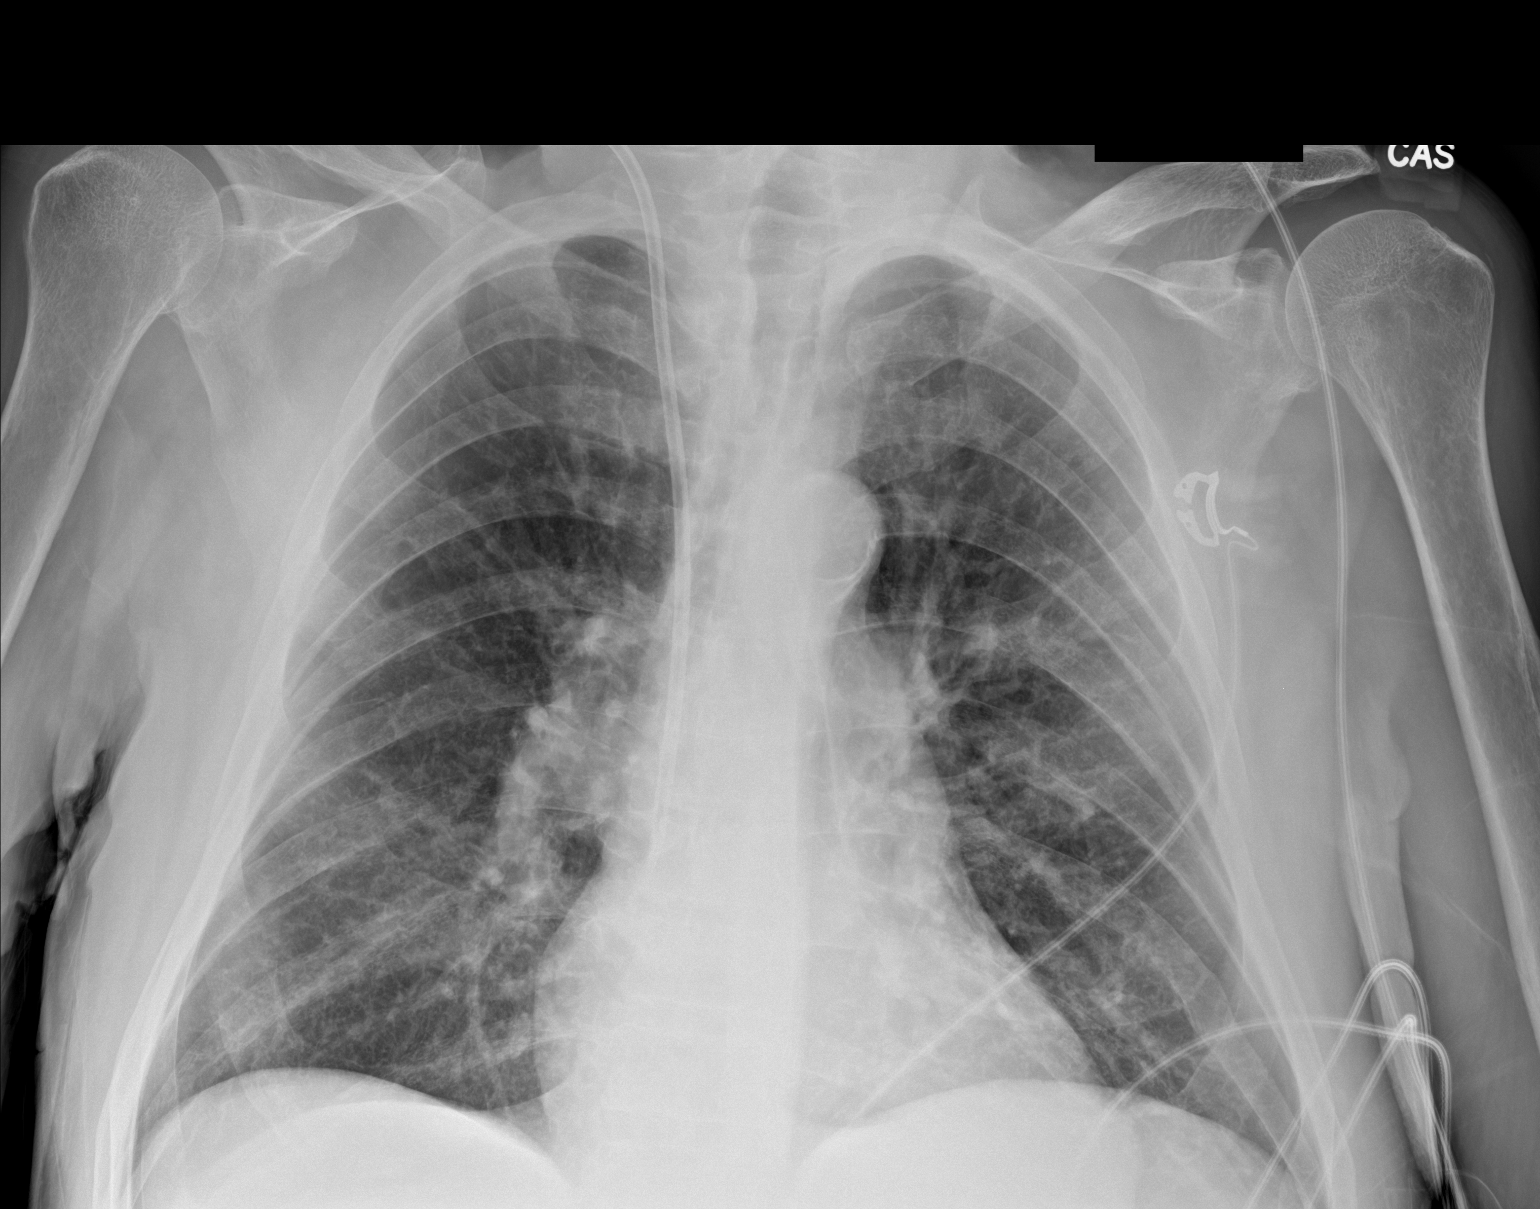

[1 of 1 positions shown; findings below may reference images not displayed]

FINDINGS: A right internal jugular venous catheter is seen with its distal tip
noted at the junction of the superior vena cava and right atrium.

Mild, chronic appearing increased lung markings are seen.

There is no evidence of a pleural effusion or pneumothorax.

The heart size and mediastinal contours are within normal limits.

Mild calcification of the aortic arch is seen.

A chronic ninth left rib fracture is seen.
IMPRESSION: Right internal jugular venous catheter positioning, as described
above.

## 2021-03-29 IMAGING — CT CT ABD-PELV W/O CM
2 of 4 series · 14 of 46 positions shown, 16 images · non-contrast
Comparison: CT the abdomen and pelvis 04/24/2019.

CLINICAL DATA: 73-year-old male with history of abdominal pain and
vomiting.

EXAM:
CT ABDOMEN AND PELVIS WITHOUT CONTRAST
TECHNIQUE: Multidetector CT imaging of the abdomen and pelvis was performed
following the standard protocol without IV contrast.

[Series 2: axial st · axial · 0.75mm/px · z∈[+248,+648]mm · 11 of 96 slices shown, 13 images]
[im 8/96  soft-tissue]
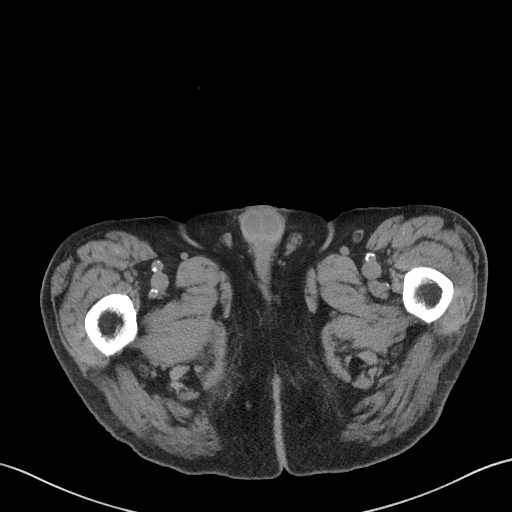
[im 8/96  bone]
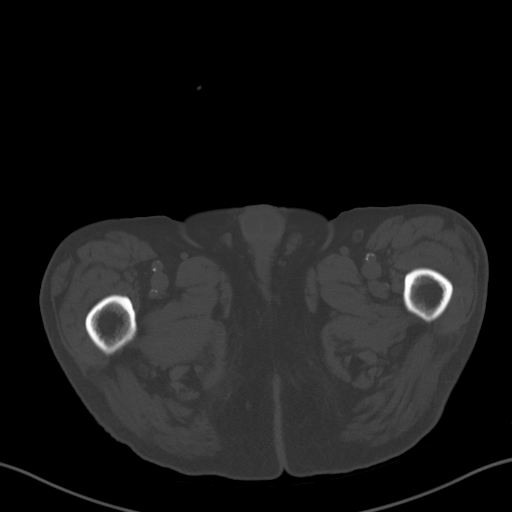
[im 16/96  soft-tissue]
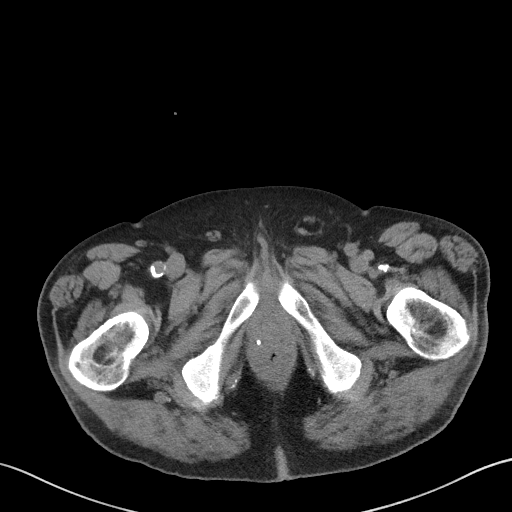
[im 23/96  soft-tissue]
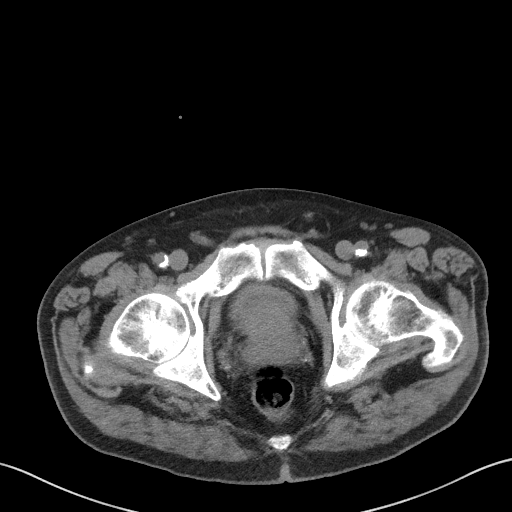
[im 31/96  soft-tissue]
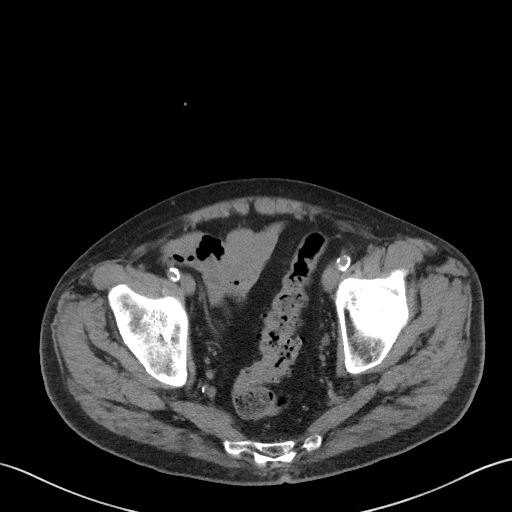
[im 39/96  soft-tissue]
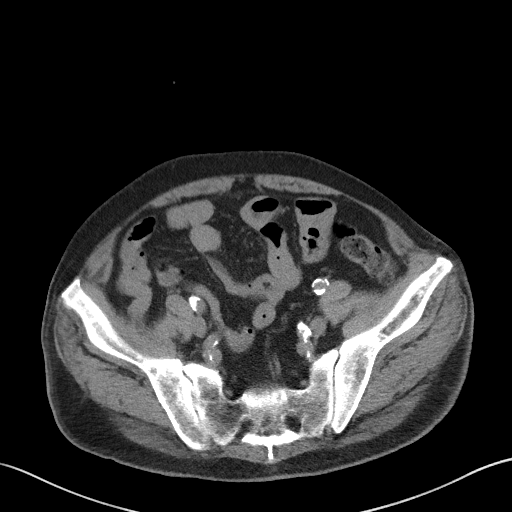
[im 50/96  soft-tissue]
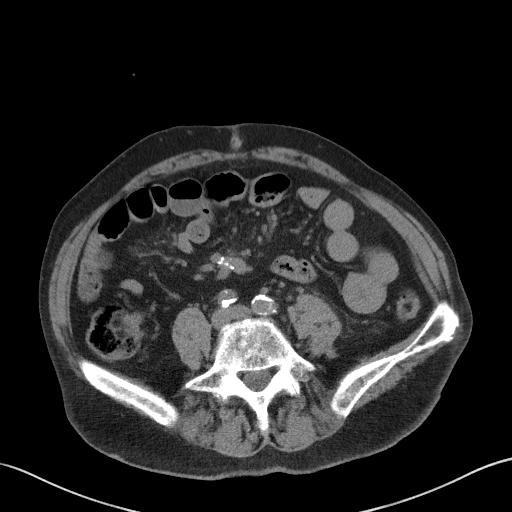
[im 58/96  soft-tissue]
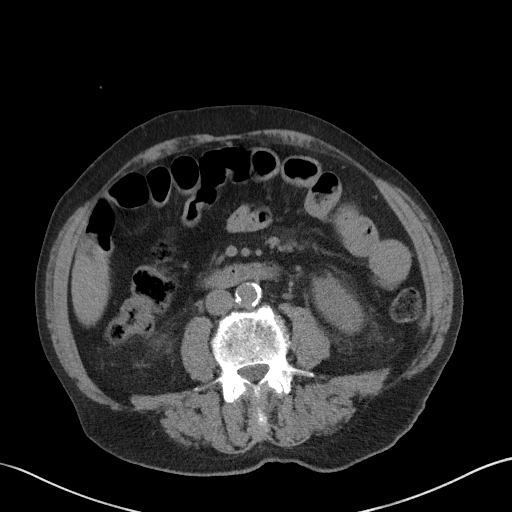
[im 65/96  soft-tissue]
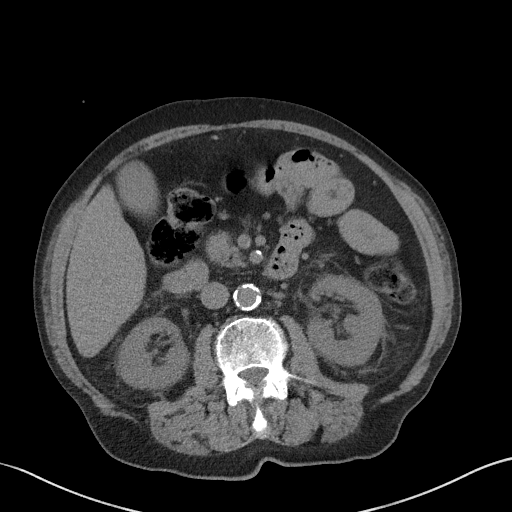
[im 73/96  soft-tissue]
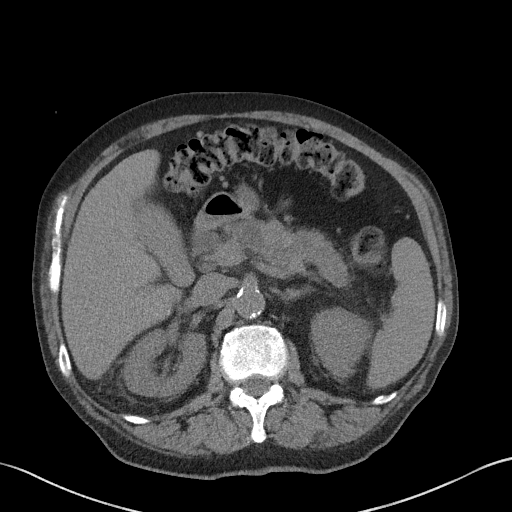
[im 73/96  bone]
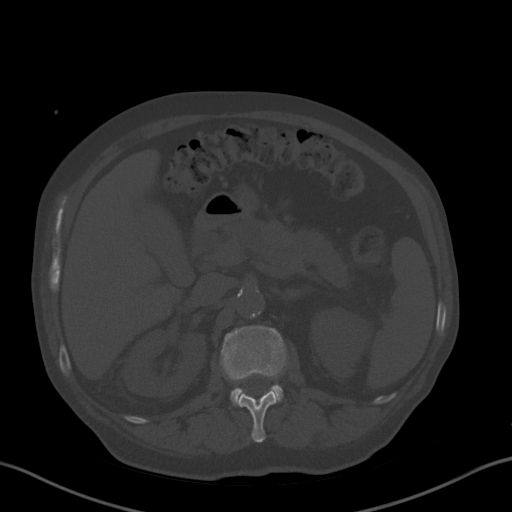
[im 80/96  soft-tissue]
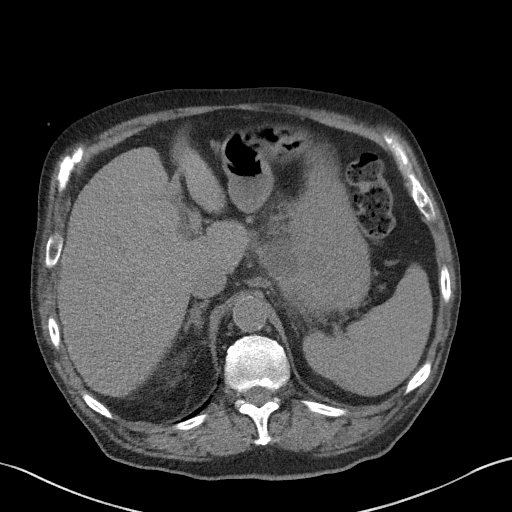
[im 88/96  soft-tissue]
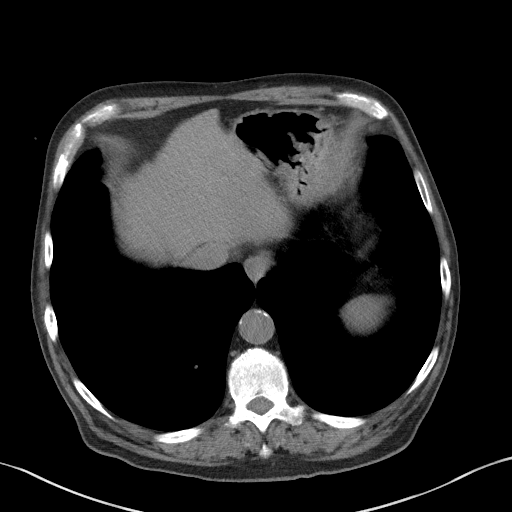

[Series 5: coronal st · coronal · 0.67mm/px · 3 of 91 slices shown]
[im 31/91  soft-tissue]
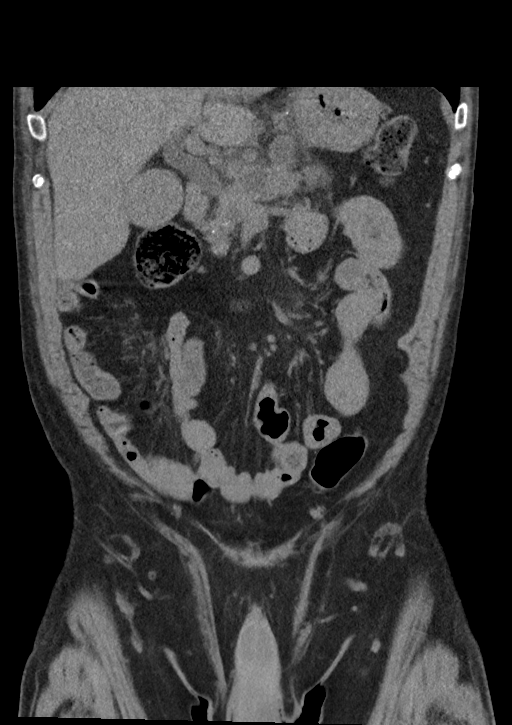
[im 41/91  soft-tissue]
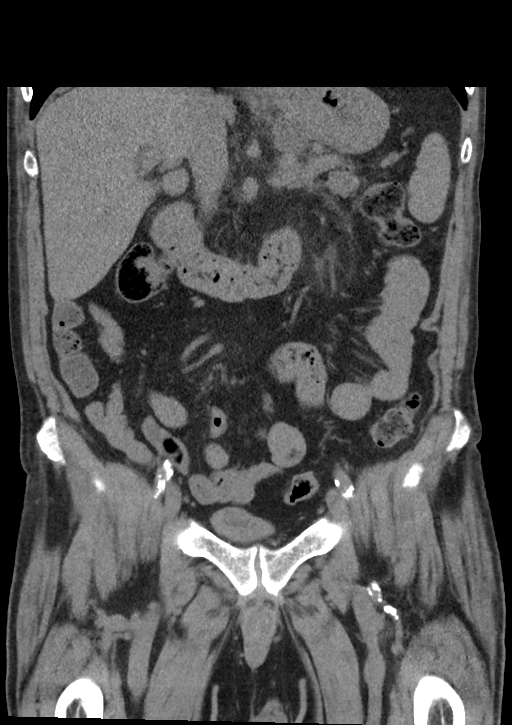
[im 51/91  soft-tissue]
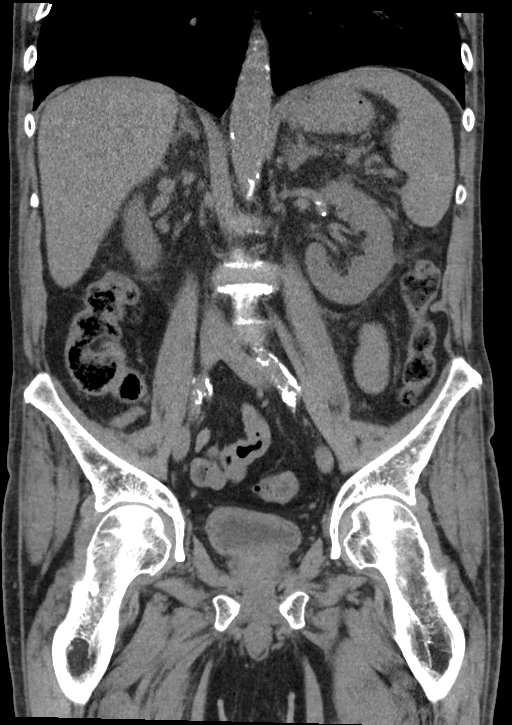

[14 of 46 positions shown; findings below may reference images not displayed]

FINDINGS: Lower chest: Aortic atherosclerosis. Calcified atherosclerotic
plaque in the left circumflex and right coronary arteries.

Hepatobiliary: No definite suspicious cystic or solid hepatic
lesions are confidently identified on today's noncontrast CT
examination. Amorphous intermediate attenuation material lying
dependently in the gallbladder compatible with biliary sludge, as
well as a tiny 3 mm calcified gallstone. No inflammatory changes
surrounding the gallbladder to suggest an acute cholecystitis at
this time.

Pancreas: There are again inflammatory changes surrounding the
pancreas, indicative of pancreatitis. Some low-attenuation areas are
now noted in and adjacent to the pancreas, including a 2.2 x 1.6 cm
area in the body of the pancreas (axial image 24 of series 2), and a
larger 4.0 x 3.0 x 3.7 cm area cephalad to the body and tail of the
pancreas intimately associated with the undersurface of the lesser
curvature of the proximal stomach (axial image 19 of series 2 and
coronal image 36 of series 5), presumably developing pancreatic
pseudocysts.

Spleen: Unremarkable.

Adrenals/Urinary Tract: Unenhanced appearance of the kidneys and
right adrenal gland is normal. 2.5 x 2.4 cm low-attenuation (0 HU)
left adrenal nodule which is partially calcified, stable compared to
the prior examination, compatible with an adenoma. No
hydroureteronephrosis. Urinary bladder is normal in appearance.

Stomach/Bowel: Normal appearance of the stomach. No pathologic
dilatation of small bowel or colon. A few scattered colonic
diverticulae are noted, particularly in the sigmoid colon, without
surrounding inflammatory changes to suggest an acute diverticulitis
at this time. The appendix is not confidently identified and may be
surgically absent. Regardless, there are no inflammatory changes
noted adjacent to the cecum to suggest the presence of an acute
appendicitis at this time.

Vascular/Lymphatic: Aortic atherosclerosis. Partially calcified
nodal mass in the central small bowel mesentery (axial image 47 of
series 2) measuring 3.2 x 1.1 cm, grossly similar to prior studies
allowing for slight difference in patient positioning. 11 mm short
axis mesenteric lymph node adjacent to the superior mesenteric
artery (axial image 35 of series 2), increased from 9 mm on the
prior examination.

Reproductive: Prostate gland and seminal vesicles are unremarkable
in appearance.

Other: No significant volume of ascites.  No pneumoperitoneum.

Musculoskeletal: There are no aggressive appearing lytic or blastic
lesions noted in the visualized portions of the skeleton.
IMPRESSION: 1. Persistent evidence of acute pancreatitis with two pancreatic and
peripancreatic fluid collections, which are presumably evolving
pancreatic pseudocysts, as detailed above.
2. Peripancreatic lymph node in the proximal small bowel mesentery
adjacent to the superior mesenteric artery measuring 11 mm,
increased in size compared to the prior study. Whether this is
reactive or indicative of metastatic disease from suspected
pancreatic head mass (which is not confidently identified on today's
noncontrast CT examination) is uncertain. Other previously noted
partially calcified nodal mass more caudally located in the small
bowel mesentery is roughly stable in size allowing for differences
in position of the lesion (previously hypermetabolic on Dotatate PET
scan, indicative of a neuroendocrine tumor).
3. Small volume of biliary sludge and tiny calcified gallstone lying
dependently in the gallbladder. No findings to suggest an acute
cholecystitis at this time.
4. Aortic atherosclerosis, in addition to at least 2 vessel coronary
artery disease. Assessment for potential risk factor modification,
dietary therapy or pharmacologic therapy may be warranted, if
clinically indicated.
5. Colonic diverticulosis without evidence of acute diverticulitis
at this time.

## 2021-03-29 IMAGING — US US ABDOMEN LIMITED
1 series · 14 of 25 positions shown · non-contrast
Comparison: None

Correlation: CT abdomen pelvis 02/08/2020

CLINICAL DATA: Elevated bilirubin

EXAM:
ULTRASOUND ABDOMEN LIMITED RIGHT UPPER QUADRANT

[Series 1: us abdomen limited ruq · 14 of 60 slices shown]
[im 1/60]
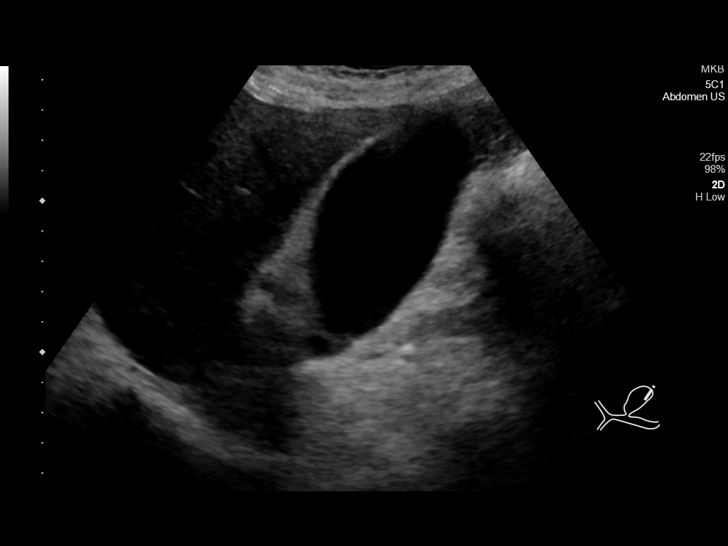
[im 5/60]
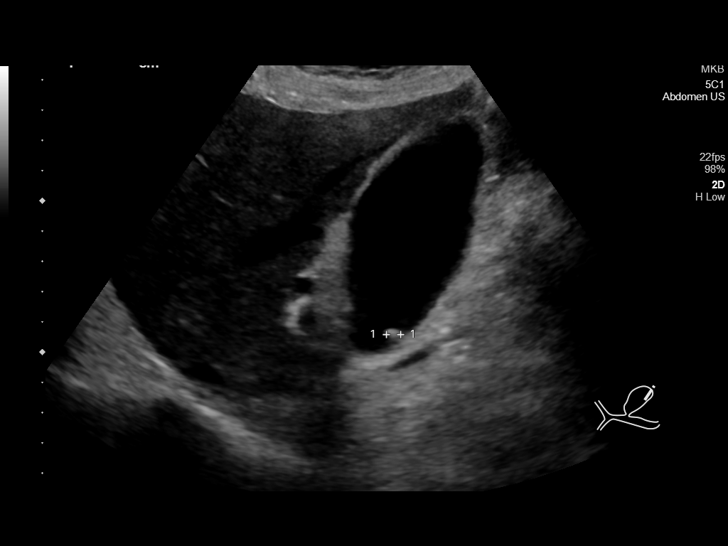
[im 10/60]
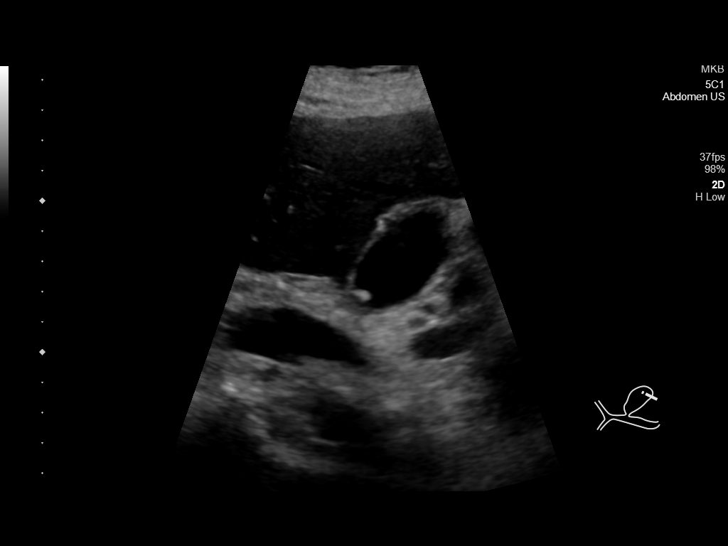
[im 15/60]
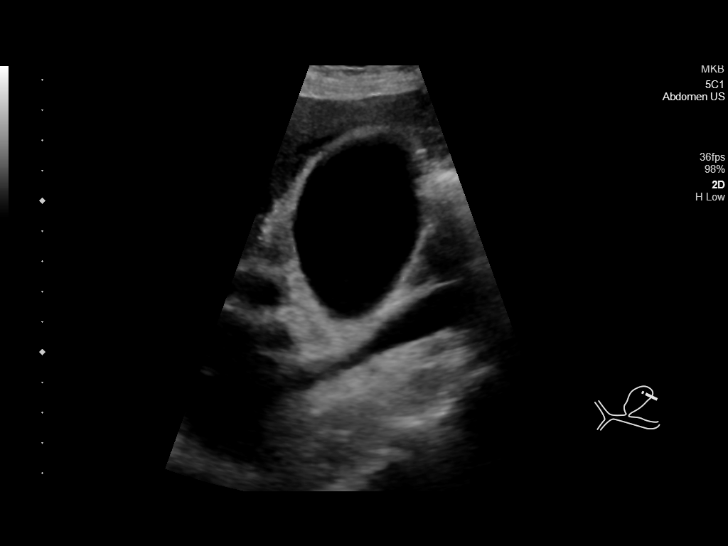
[im 20/60]
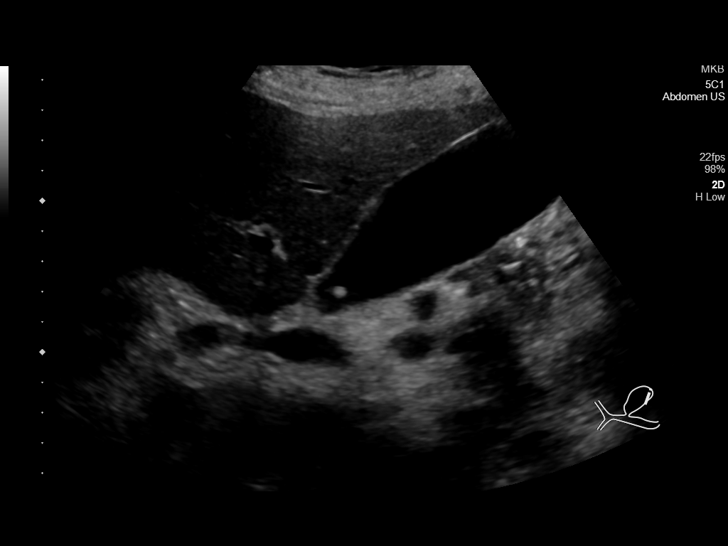
[im 23/60]
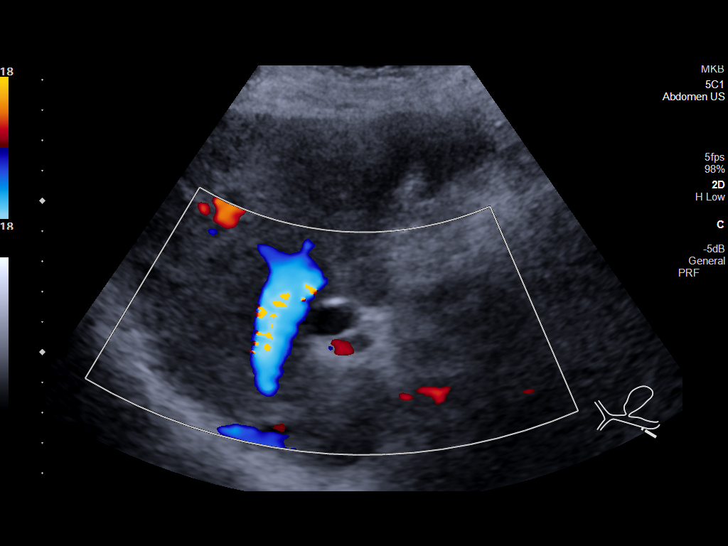
[im 28/60]
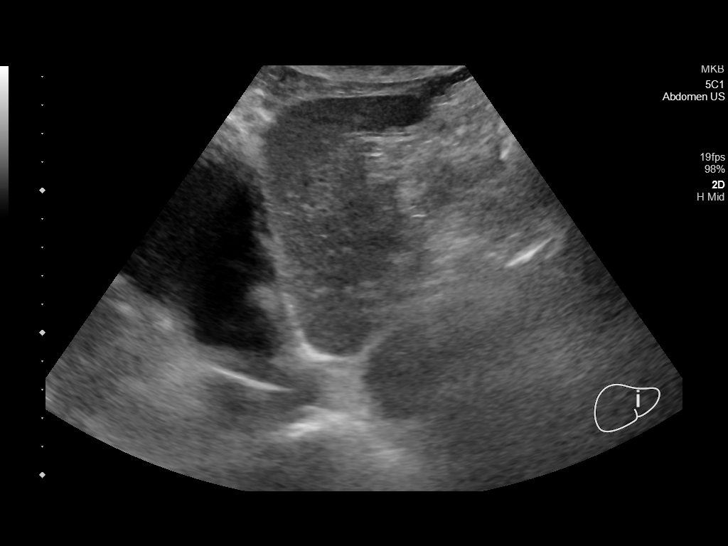
[im 32/60]
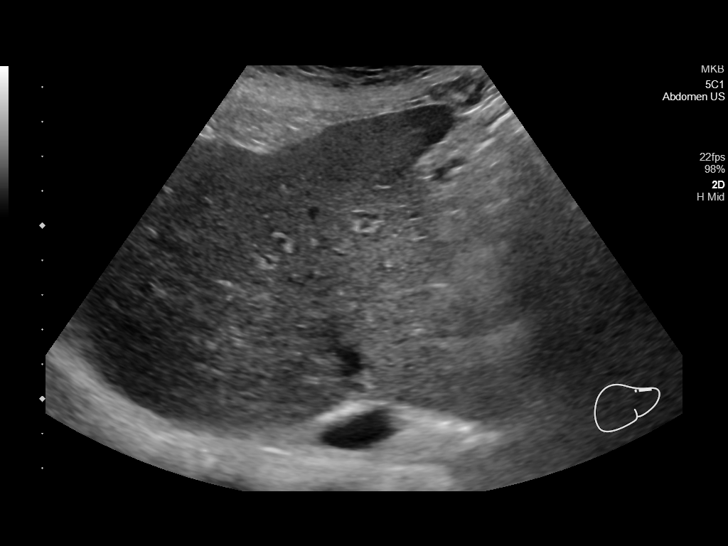
[im 37/60]
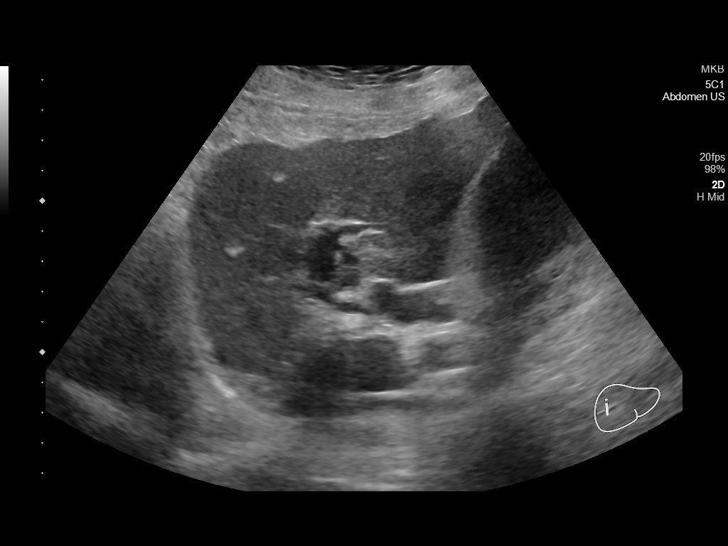
[im 40/60]
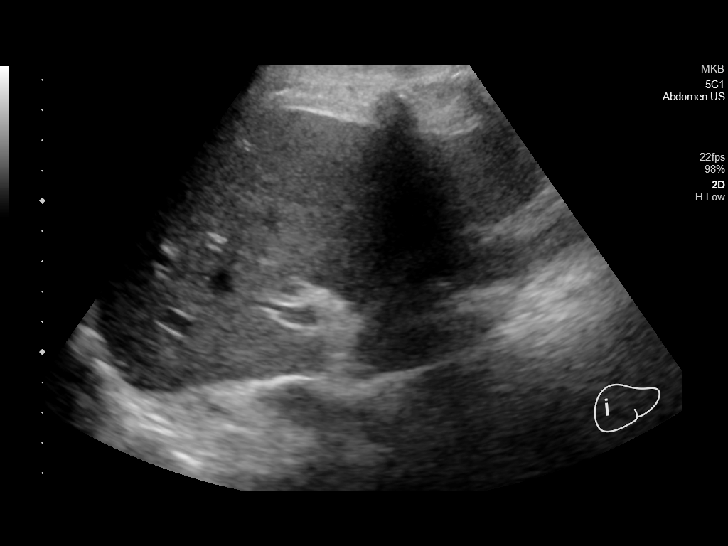
[im 45/60]
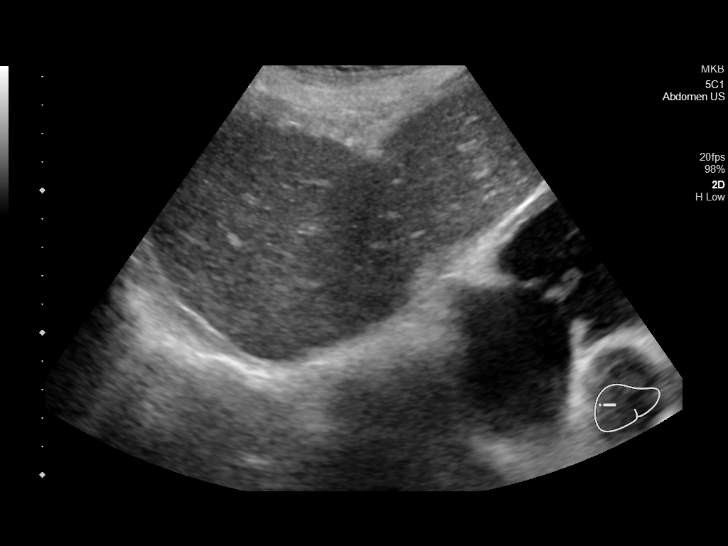
[im 50/60]
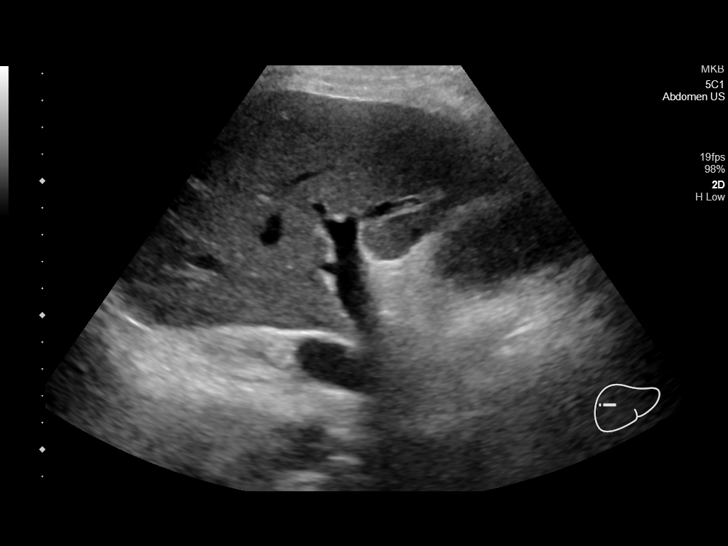
[im 55/60]
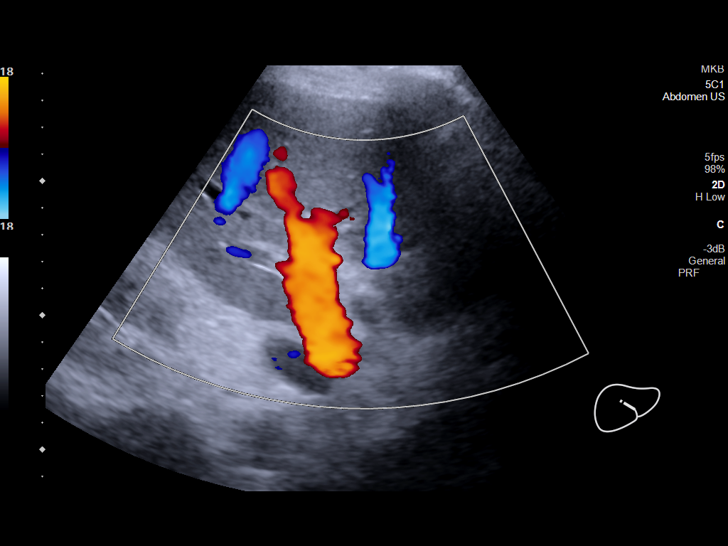
[im 60/60]
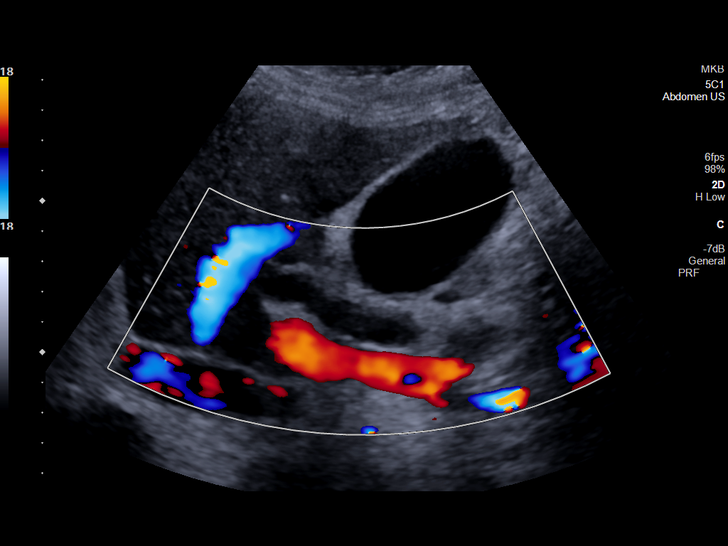

[14 of 25 positions shown; findings below may reference images not displayed]

FINDINGS: Gallbladder:

5 mm intraluminal echogenic focus, not definitely mobile; this could
represent a small polyp or could represent a small adherent
gallstone. Patient had a 3 mm gallstone on CT. No gallbladder wall
thickening, pericholecystic fluid or sonographic Murphy sign.

Common bile duct:

Diameter: 12 mm, dilated

Liver:

Normal echogenicity. Hepatic contour appears subtly
nodular/irregular cannot exclude cirrhosis, though not well
demonstrated on CT. No hepatic masses. Minimal intrahepatic biliary
dilatation. Portal vein is patent on color Doppler imaging with
normal direction of blood flow towards the liver.

Other: No RIGHT upper quadrant free fluid.
IMPRESSION: Dilated CBD with minimal intrahepatic biliary dilatation, recommend
correlation with LFTs.

Questionable 5 mm gallstone versus gallbladder polyp.

Hepatic contours appear subtly nodular, cannot exclude cirrhosis.

## 2021-07-08 IMAGING — CT CT ABD-PELV W/O CM
2 of 4 series · 14 of 46 positions shown, 16 images · non-contrast
Comparison: February 08, 2020

CLINICAL DATA: Abdominal pain with nausea in the setting of
pancreatic neuroendocrine tumor

EXAM:
CT ABDOMEN AND PELVIS WITHOUT CONTRAST
TECHNIQUE: Multidetector CT imaging of the abdomen and pelvis was performed
following the standard protocol without IV contrast.

[Series 2: routine abd/pel wo · axial · 0.69mm/px · z∈[-519,-149]mm · 11 of 90 slices shown, 13 images]
[im 8/90  soft-tissue]
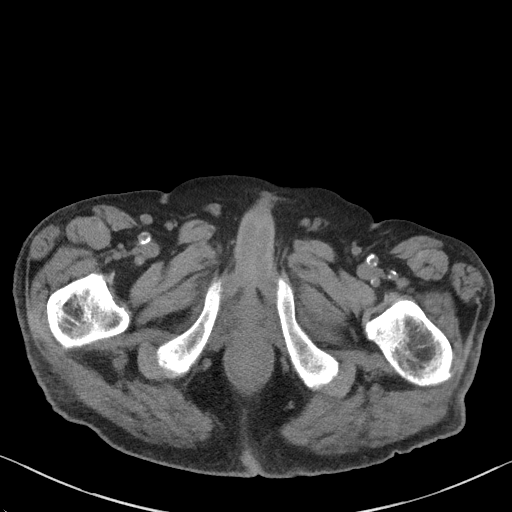
[im 8/90  bone]
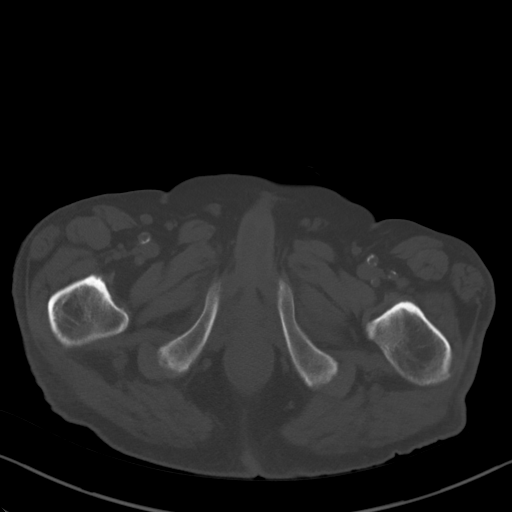
[im 15/90  soft-tissue]
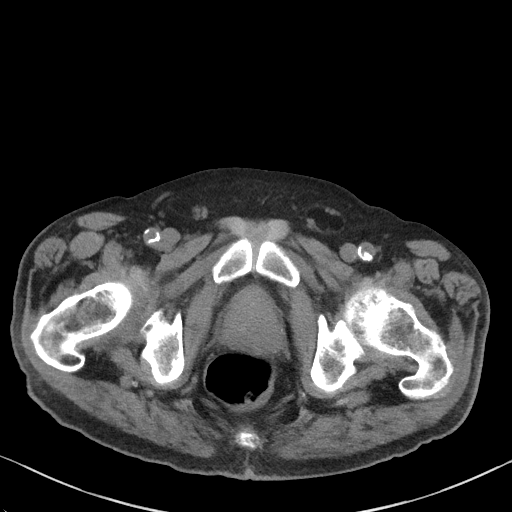
[im 22/90  soft-tissue]
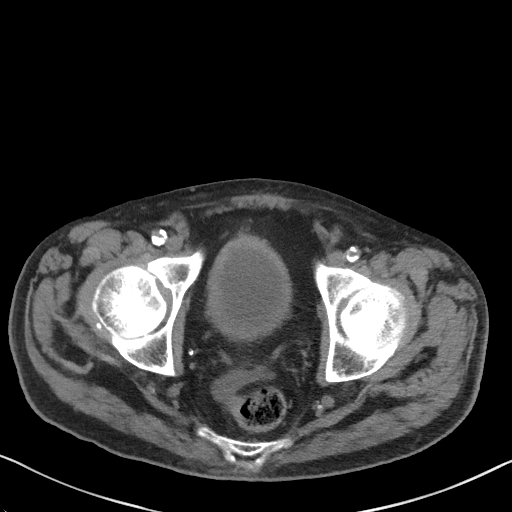
[im 29/90  soft-tissue]
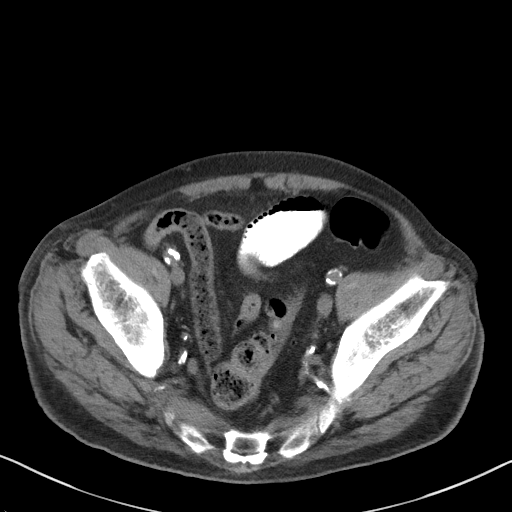
[im 36/90  soft-tissue]
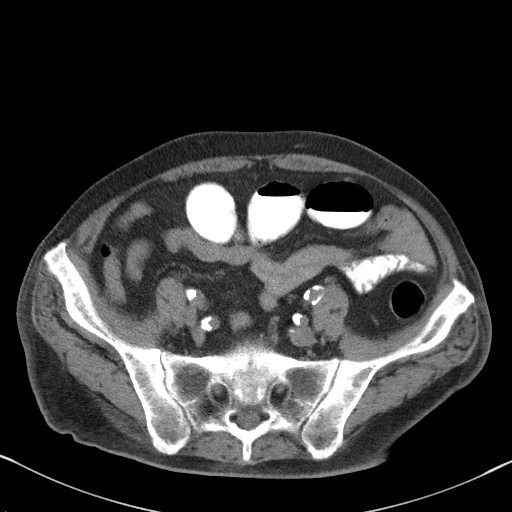
[im 47/90  soft-tissue]
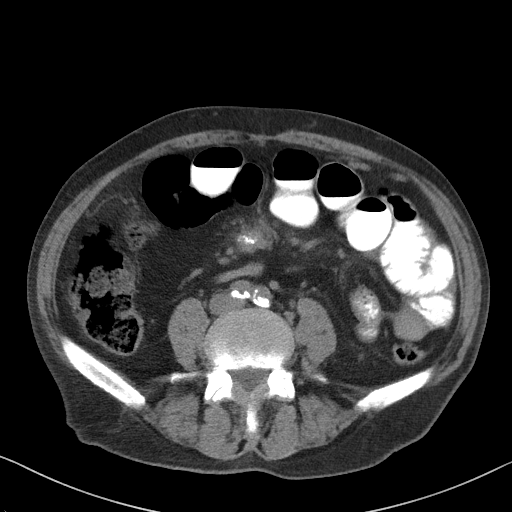
[im 54/90  soft-tissue]
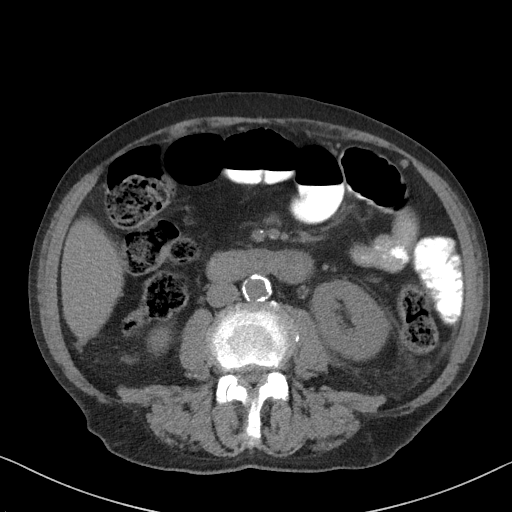
[im 61/90  soft-tissue]
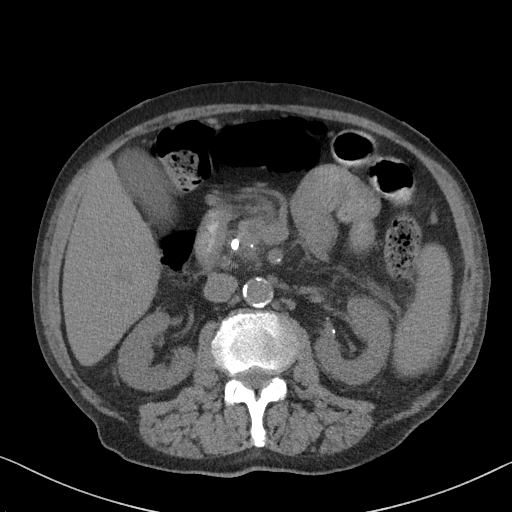
[im 68/90  soft-tissue]
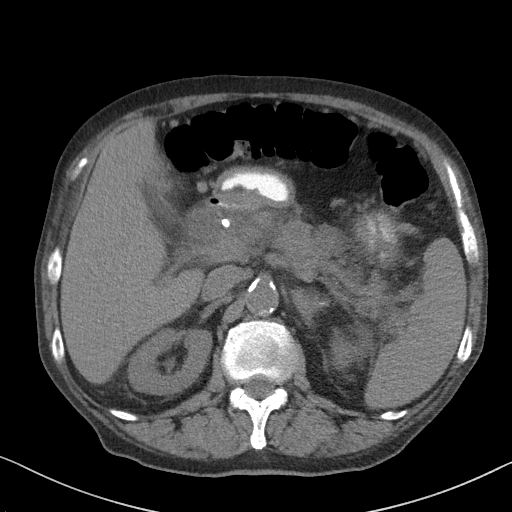
[im 68/90  bone]
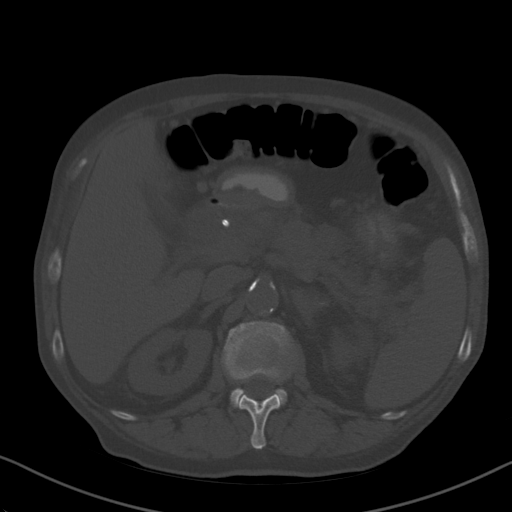
[im 75/90  soft-tissue]
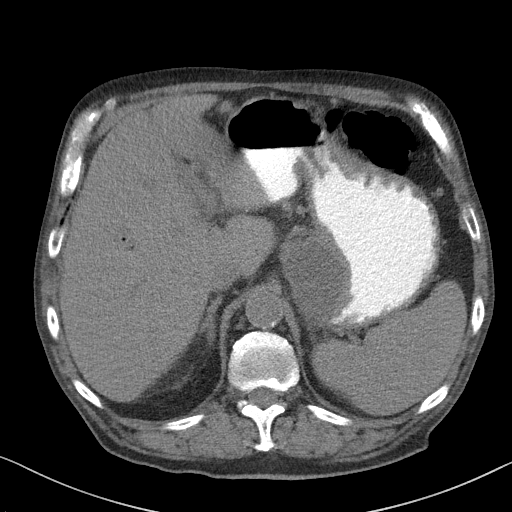
[im 82/90  soft-tissue]
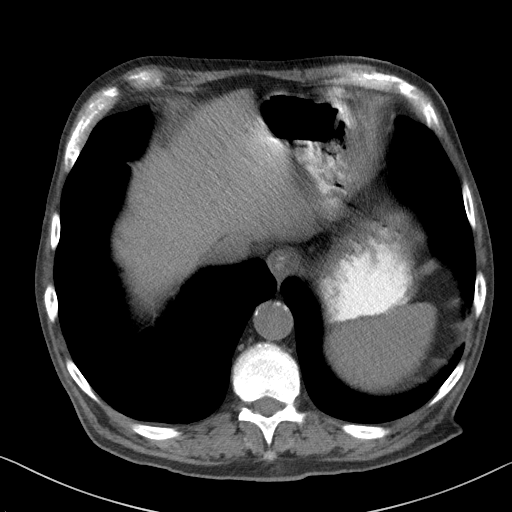

[Series 5: coronal st · coronal · 0.67mm/px · 3 of 92 slices shown]
[im 31/92  soft-tissue]
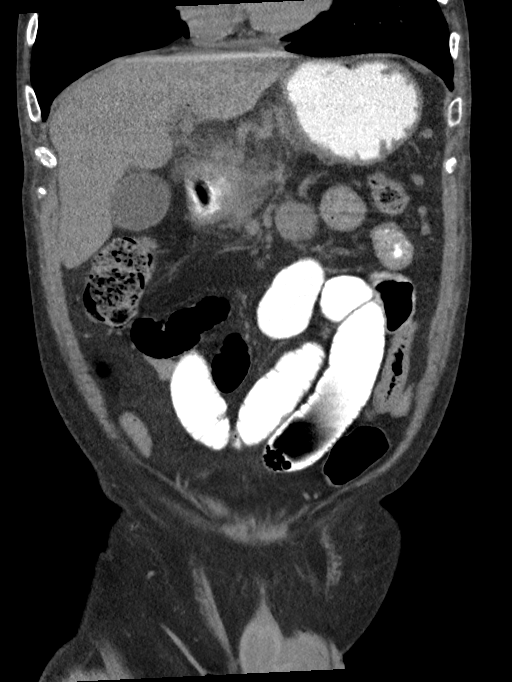
[im 41/92  soft-tissue]
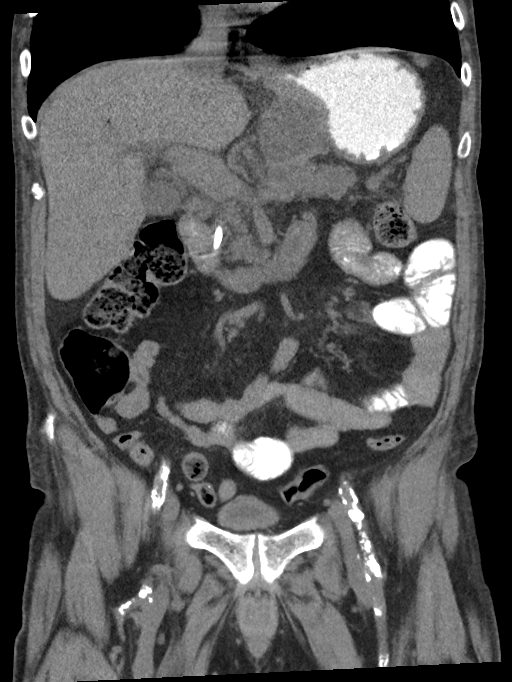
[im 51/92  soft-tissue]
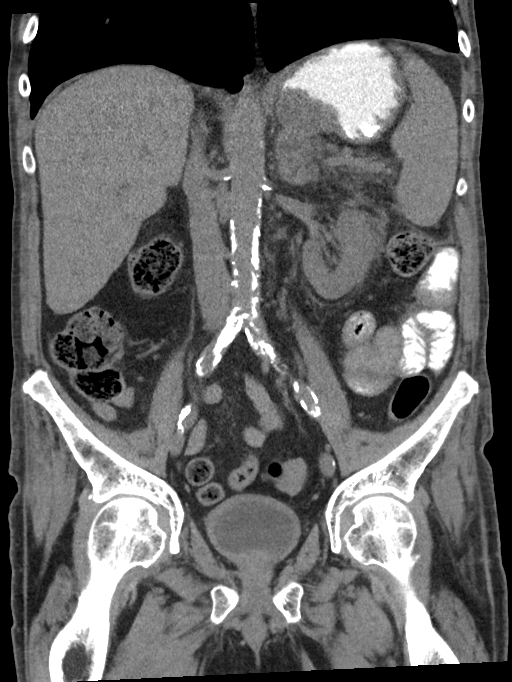

[14 of 46 positions shown; findings below may reference images not displayed]

FINDINGS: Lower chest: 4 mm LEFT lower lobe pulmonary nodule along the pleural
surface not present on Tuesday January, 2020. No consolidation. No pleural
effusion.

Hepatobiliary: Post placement of biliary stent. Despite presence of
stent the common bile duct is more dilated than on the previous exam
measuring up to 19 mm. Previously approximately 13 mm.

Masslike areas in the pancreatic head are similar. There is been
interval development of low attenuation in the pancreatic neck that
was not present previously approximately 15 mm (image 28, series 2)

Also further dilation of the distal pancreatic duct is suggested
with increase in the soft tissue density in the mid body of the
pancreas. This area measuring approximately 3.9 cm greatest
thickness at the site of ductal transition where as on the prior
study it measured approximately 3.5 cm.

Extending from the upper margin of the dilated duct at the site of
ductal transition is a low-attenuation ovoid area measuring water
density at 5 by 3.1 cm, abutting the lesser curvature. (Image 19,
series 2) previously measuring approximately 3.9 x 3.0 cm in this
location peripancreatic stranding is mildly increased when compared
to the prior study.

Soft tissue lesion associated with LEFT adrenal contiguous with with
increase in size (image 21, series [DATE] x 2.7 cm, previously 2.5 x
2.4 cm.

Pancreas: As above

Spleen: Spleen grossly normal on noncontrast imaging.

Adrenals/Urinary Tract: Adrenal gland on the RIGHT is normal. LEFT
adrenal mass contiguous with the pancreas enlarging as described.
Mild perinephric stranding and in general anterior pararenal
stranding on the LEFT. No hydronephrosis. Urinary bladder is
unremarkable.

Stomach/Bowel: Stomach with mild to moderate distension. Mild
dilation of small bowel loops becomes moderately dilated in the mid
small bowel at the level of the jejunum. There is peristaltic
activity versus is transition in the distal jejunum at 3.6 cm
caliber proximal to the site of narrowing with decompression of more
distal small bowel loops.

Colon is largely stool filled. Stool like material is present in the
terminal ileum.

Vascular/Lymphatic: Calcified atheromatous plaque of the abdominal
aorta. No aneurysmal dilation.

Adenopathy in the mesentery, largest measuring 11 mm is unchanged
since the previous exam.

Calcified mesenteric soft tissue (image 44, series 2) approximately
2.7 x 1.8 cm enlarged in short axis though difficult to measure
given potential changes in mesenteric position over time.

Reproductive: No adenopathy in the pelvis.

Other: No free air.  No ascites.

Musculoskeletal: No acute musculoskeletal process. Spinal
degenerative changes.
IMPRESSION: 1. Signs of pancreatitis and presumed pancreatic pseudocysts with
enlargement of area in the lesser sac abutting the stomach as
described.
2. Worsening of biliary duct distension above the level of the stent
since previous imaging from Tuesday January, 2020
3. Increasing distension of small bowel in the proximal small bowel
with site of transition in the distal jejunum. Is unclear whether
this represents vigorous peristaltic activity following contrast
administration or developing small bowel obstruction or ileus.
Subsequent follow-up based on symptoms with radiographs may be
helpful.
4. Increasing soft tissue and increasing ductal dilation with at
least 2 sites of transition in this patient with known
neuroendocrine tumor suspicious for worsening tumor with enlarging
adrenal involvement on the LEFT as discussed, at the site of
previous more benign appearing LEFT adrenal neoplasm now inseparable
from stranding and soft tissue in the region of the pancreatic tail.
5. Given appearance it is possible that direct extension of
stranding from the pancreas towards the LEFT adrenal may relate to
inflammation. Attention on follow-up.
6. Extensive portosystemic collaterals in the upper abdomen
compatible with splenic venous compromise, not well assessed but
this secondary finding is strongly suggestive of splenic venous
compromise due to worsening tumor in the region of the pancreas.
